# Patient Record
Sex: Female | Born: 1935
Health system: Southern US, Community
[De-identification: ages and names within clinical notes are randomized; demographics above are authoritative.]

## PROBLEM LIST (undated history)

## (undated) DIAGNOSIS — E669 Obesity, unspecified: Secondary | ICD-10-CM

## (undated) DIAGNOSIS — D510 Vitamin B12 deficiency anemia due to intrinsic factor deficiency: Secondary | ICD-10-CM

## (undated) DIAGNOSIS — H409 Unspecified glaucoma: Secondary | ICD-10-CM

## (undated) DIAGNOSIS — I10 Essential (primary) hypertension: Secondary | ICD-10-CM

## (undated) DIAGNOSIS — L723 Sebaceous cyst: Secondary | ICD-10-CM

## (undated) DIAGNOSIS — R269 Unspecified abnormalities of gait and mobility: Secondary | ICD-10-CM

## (undated) DIAGNOSIS — G35 Multiple sclerosis: Secondary | ICD-10-CM

## (undated) DIAGNOSIS — E538 Deficiency of other specified B group vitamins: Secondary | ICD-10-CM

## (undated) DIAGNOSIS — C50919 Malignant neoplasm of unspecified site of unspecified female breast: Secondary | ICD-10-CM

## (undated) DIAGNOSIS — G35D Multiple sclerosis, unspecified: Secondary | ICD-10-CM

## (undated) DIAGNOSIS — L259 Unspecified contact dermatitis, unspecified cause: Secondary | ICD-10-CM

## (undated) DIAGNOSIS — E039 Hypothyroidism, unspecified: Secondary | ICD-10-CM

## (undated) DIAGNOSIS — E785 Hyperlipidemia, unspecified: Secondary | ICD-10-CM

## (undated) DIAGNOSIS — M1710 Unilateral primary osteoarthritis, unspecified knee: Secondary | ICD-10-CM

## (undated) DIAGNOSIS — R7302 Impaired glucose tolerance (oral): Secondary | ICD-10-CM

## (undated) DIAGNOSIS — H269 Unspecified cataract: Secondary | ICD-10-CM

## (undated) DIAGNOSIS — IMO0002 Reserved for concepts with insufficient information to code with codable children: Secondary | ICD-10-CM

## (undated) HISTORY — DX: Deficiency of other specified B group vitamins: E53.8

## (undated) HISTORY — PX: UPPER GASTROINTESTINAL ENDOSCOPY: SHX188

## (undated) HISTORY — DX: Unspecified abnormalities of gait and mobility: R26.9

## (undated) HISTORY — DX: Unilateral primary osteoarthritis, unspecified knee: M17.10

## (undated) HISTORY — DX: Essential (primary) hypertension: I10

## (undated) HISTORY — DX: Vitamin B12 deficiency anemia due to intrinsic factor deficiency: D51.0

## (undated) HISTORY — DX: Impaired glucose tolerance (oral): R73.02

## (undated) HISTORY — DX: Unspecified glaucoma: H40.9

## (undated) HISTORY — DX: Malignant neoplasm of unspecified site of unspecified female breast: C50.919

## (undated) HISTORY — DX: Hyperlipidemia, unspecified: E78.5

## (undated) HISTORY — DX: Obesity, unspecified: E66.9

## (undated) HISTORY — DX: Unspecified cataract: H26.9

## (undated) HISTORY — DX: Sebaceous cyst: L72.3

## (undated) HISTORY — PX: COLONOSCOPY: SHX174

## (undated) HISTORY — DX: Reserved for concepts with insufficient information to code with codable children: IMO0002

## (undated) HISTORY — DX: Unspecified contact dermatitis, unspecified cause: L25.9

## (undated) HISTORY — DX: Multiple sclerosis: G35

---
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
## 1998-07-16 ENCOUNTER — Other Ambulatory Visit: Admission: RE | Admit: 1998-07-16 | Discharge: 1998-07-16 | Payer: Self-pay | Admitting: Family Medicine

## 1999-10-10 ENCOUNTER — Emergency Department (HOSPITAL_COMMUNITY): Admission: EM | Admit: 1999-10-10 | Discharge: 1999-10-10 | Payer: Self-pay | Admitting: Emergency Medicine

## 2000-10-08 ENCOUNTER — Other Ambulatory Visit: Admission: RE | Admit: 2000-10-08 | Discharge: 2000-10-08 | Payer: Self-pay | Admitting: Family Medicine

## 2000-11-17 ENCOUNTER — Encounter: Payer: Self-pay | Admitting: *Deleted

## 2000-11-17 ENCOUNTER — Emergency Department (HOSPITAL_COMMUNITY): Admission: EM | Admit: 2000-11-17 | Discharge: 2000-11-17 | Payer: Self-pay | Admitting: Emergency Medicine

## 2001-10-04 ENCOUNTER — Other Ambulatory Visit: Admission: RE | Admit: 2001-10-04 | Discharge: 2001-10-04 | Payer: Self-pay | Admitting: Family Medicine

## 2003-10-31 ENCOUNTER — Emergency Department (HOSPITAL_COMMUNITY): Admission: EM | Admit: 2003-10-31 | Discharge: 2003-10-31 | Payer: Self-pay | Admitting: Emergency Medicine

## 2004-12-08 ENCOUNTER — Ambulatory Visit: Payer: Self-pay | Admitting: Family Medicine

## 2004-12-15 ENCOUNTER — Ambulatory Visit: Payer: Self-pay | Admitting: Family Medicine

## 2005-01-21 ENCOUNTER — Ambulatory Visit: Payer: Self-pay | Admitting: Family Medicine

## 2005-03-23 LAB — HM COLONOSCOPY: HM Colonoscopy: NORMAL

## 2005-04-24 ENCOUNTER — Ambulatory Visit: Payer: Self-pay | Admitting: Family Medicine

## 2005-04-28 ENCOUNTER — Ambulatory Visit: Payer: Self-pay | Admitting: Family Medicine

## 2005-05-13 ENCOUNTER — Ambulatory Visit: Payer: Self-pay | Admitting: Family Medicine

## 2005-06-10 ENCOUNTER — Ambulatory Visit: Payer: Self-pay | Admitting: Family Medicine

## 2005-11-24 ENCOUNTER — Ambulatory Visit: Payer: Self-pay | Admitting: Family Medicine

## 2005-11-30 ENCOUNTER — Ambulatory Visit: Payer: Self-pay | Admitting: Family Medicine

## 2005-12-22 ENCOUNTER — Ambulatory Visit: Payer: Self-pay | Admitting: Gastroenterology

## 2005-12-30 ENCOUNTER — Ambulatory Visit: Payer: Self-pay | Admitting: Family Medicine

## 2006-01-05 ENCOUNTER — Ambulatory Visit: Payer: Self-pay | Admitting: Gastroenterology

## 2006-01-05 ENCOUNTER — Encounter: Payer: Self-pay | Admitting: Gastroenterology

## 2006-03-08 ENCOUNTER — Ambulatory Visit: Payer: Self-pay | Admitting: Family Medicine

## 2006-10-28 DIAGNOSIS — IMO0002 Reserved for concepts with insufficient information to code with codable children: Secondary | ICD-10-CM | POA: Insufficient documentation

## 2006-10-28 DIAGNOSIS — I10 Essential (primary) hypertension: Secondary | ICD-10-CM | POA: Insufficient documentation

## 2006-10-28 DIAGNOSIS — G35 Multiple sclerosis: Secondary | ICD-10-CM

## 2006-10-28 DIAGNOSIS — L259 Unspecified contact dermatitis, unspecified cause: Secondary | ICD-10-CM | POA: Insufficient documentation

## 2006-10-28 HISTORY — DX: Reserved for concepts with insufficient information to code with codable children: IMO0002

## 2006-10-28 HISTORY — DX: Unspecified contact dermatitis, unspecified cause: L25.9

## 2006-10-28 HISTORY — DX: Multiple sclerosis: G35

## 2006-10-28 HISTORY — DX: Essential (primary) hypertension: I10

## 2006-11-25 ENCOUNTER — Ambulatory Visit: Payer: Self-pay | Admitting: Family Medicine

## 2006-11-25 LAB — CONVERTED CEMR LAB
ALT: 30 units/L (ref 0–35)
AST: 25 units/L (ref 0–37)
Albumin: 3.6 g/dL (ref 3.5–5.2)
Alkaline Phosphatase: 73 units/L (ref 39–117)
BUN: 13 mg/dL (ref 6–23)
Basophils Absolute: 0 10*3/uL (ref 0.0–0.1)
Basophils Relative: 0.5 % (ref 0.0–1.0)
Bilirubin Urine: NEGATIVE
Bilirubin, Direct: 0.1 mg/dL (ref 0.0–0.3)
Blood in Urine, dipstick: NEGATIVE
CO2: 28 meq/L (ref 19–32)
Calcium: 9.2 mg/dL (ref 8.4–10.5)
Chloride: 109 meq/L (ref 96–112)
Cholesterol: 254 mg/dL (ref 0–200)
Creatinine, Ser: 1 mg/dL (ref 0.4–1.2)
Direct LDL: 178.1 mg/dL
Eosinophils Absolute: 0.1 10*3/uL (ref 0.0–0.6)
Eosinophils Relative: 1.6 % (ref 0.0–5.0)
GFR calc Af Amer: 70 mL/min
GFR calc non Af Amer: 58 mL/min
Glucose, Bld: 107 mg/dL — ABNORMAL HIGH (ref 70–99)
Glucose, Urine, Semiquant: NEGATIVE
HCT: 33.6 % — ABNORMAL LOW (ref 36.0–46.0)
HDL: 39.5 mg/dL (ref >39.0)
Hemoglobin: 11.3 g/dL — ABNORMAL LOW (ref 12.0–15.0)
Ketones, urine, test strip: NEGATIVE
Lymphocytes Relative: 36.6 % (ref 12.0–46.0)
MCHC: 33.7 g/dL (ref 30.0–36.0)
MCV: 86 fL (ref 78.0–100.0)
Monocytes Absolute: 0.5 10*3/uL (ref 0.2–0.7)
Monocytes Relative: 7.7 % (ref 3.0–11.0)
Neutro Abs: 3.2 10*3/uL (ref 1.4–7.7)
Neutrophils Relative %: 53.6 % (ref 43.0–77.0)
Nitrite: NEGATIVE
Platelets: 328 10*3/uL (ref 150–400)
Potassium: 4.2 meq/L (ref 3.5–5.1)
Protein, U semiquant: NEGATIVE
RBC: 3.91 M/uL (ref 3.87–5.11)
RDW: 13.9 % (ref 11.5–14.6)
Sodium: 143 meq/L (ref 135–145)
Specific Gravity, Urine: 1.015
TSH: 4.52 microintl units/mL (ref 0.35–5.50)
Total Bilirubin: 0.8 mg/dL (ref 0.3–1.2)
Total CHOL/HDL Ratio: 6.4
Total Protein: 7.3 g/dL (ref 6.0–8.3)
Triglycerides: 155 mg/dL — ABNORMAL HIGH (ref 0–149)
Urobilinogen, UA: 0.2
VLDL: 31 mg/dL (ref 0–40)
WBC: 6 10*3/uL (ref 4.5–10.5)
pH: 6

## 2006-12-03 ENCOUNTER — Ambulatory Visit: Payer: Self-pay | Admitting: Family Medicine

## 2007-01-04 ENCOUNTER — Ambulatory Visit: Payer: Self-pay | Admitting: Family Medicine

## 2007-03-03 ENCOUNTER — Encounter: Admission: RE | Admit: 2007-03-03 | Discharge: 2007-03-31 | Payer: Self-pay | Admitting: Neurology

## 2007-06-22 ENCOUNTER — Encounter: Payer: Self-pay | Admitting: Family Medicine

## 2007-07-06 ENCOUNTER — Encounter: Payer: Self-pay | Admitting: Family Medicine

## 2007-12-09 ENCOUNTER — Ambulatory Visit: Payer: Self-pay | Admitting: Family Medicine

## 2007-12-09 LAB — CONVERTED CEMR LAB
ALT: 21 units/L (ref 0–35)
AST: 23 units/L (ref 0–37)
Albumin: 3.6 g/dL (ref 3.5–5.2)
Alkaline Phosphatase: 78 units/L (ref 39–117)
BUN: 10 mg/dL (ref 6–23)
Basophils Absolute: 0 10*3/uL (ref 0.0–0.1)
Basophils Relative: 0.6 % (ref 0.0–3.0)
Bilirubin, Direct: 0.1 mg/dL (ref 0.0–0.3)
CO2: 27 meq/L (ref 19–32)
Calcium: 8.8 mg/dL (ref 8.4–10.5)
Chloride: 110 meq/L (ref 96–112)
Creatinine, Ser: 0.9 mg/dL (ref 0.4–1.2)
Eosinophils Absolute: 0 10*3/uL (ref 0.0–0.7)
Eosinophils Relative: 0.8 % (ref 0.0–5.0)
GFR calc Af Amer: 79 mL/min
GFR calc non Af Amer: 65 mL/min
Glucose, Bld: 110 mg/dL — ABNORMAL HIGH (ref 70–99)
HCT: 33.6 % — ABNORMAL LOW (ref 36.0–46.0)
Hemoglobin: 11.6 g/dL — ABNORMAL LOW (ref 12.0–15.0)
Lymphocytes Relative: 30.5 % (ref 12.0–46.0)
MCHC: 34.4 g/dL (ref 30.0–36.0)
MCV: 85 fL (ref 78.0–100.0)
Monocytes Absolute: 0.3 10*3/uL (ref 0.1–1.0)
Monocytes Relative: 6 % (ref 3.0–12.0)
Neutro Abs: 3.5 10*3/uL (ref 1.4–7.7)
Neutrophils Relative %: 62.1 % (ref 43.0–77.0)
Platelets: 319 10*3/uL (ref 150–400)
Potassium: 3.6 meq/L (ref 3.5–5.1)
RBC: 3.95 M/uL (ref 3.87–5.11)
RDW: 13.9 % (ref 11.5–14.6)
Sodium: 142 meq/L (ref 135–145)
TSH: 2.34 microintl units/mL (ref 0.35–5.50)
Total Bilirubin: 1.1 mg/dL (ref 0.3–1.2)
Total Protein: 7.4 g/dL (ref 6.0–8.3)
WBC: 5.5 10*3/uL (ref 4.5–10.5)

## 2008-01-05 ENCOUNTER — Telehealth: Payer: Self-pay | Admitting: Family Medicine

## 2008-01-05 ENCOUNTER — Ambulatory Visit: Payer: Self-pay | Admitting: Family Medicine

## 2008-01-31 ENCOUNTER — Telehealth: Payer: Self-pay | Admitting: Family Medicine

## 2008-03-26 ENCOUNTER — Telehealth: Payer: Self-pay | Admitting: Family Medicine

## 2008-06-18 ENCOUNTER — Encounter: Payer: Self-pay | Admitting: Internal Medicine

## 2008-06-22 ENCOUNTER — Encounter: Payer: Self-pay | Admitting: Family Medicine

## 2008-12-13 ENCOUNTER — Ambulatory Visit: Payer: Self-pay | Admitting: Family Medicine

## 2008-12-17 ENCOUNTER — Encounter: Payer: Self-pay | Admitting: Family Medicine

## 2008-12-19 ENCOUNTER — Encounter: Payer: Self-pay | Admitting: Internal Medicine

## 2009-01-07 ENCOUNTER — Telehealth: Payer: Self-pay | Admitting: Family Medicine

## 2009-01-22 ENCOUNTER — Ambulatory Visit: Payer: Self-pay | Admitting: Family Medicine

## 2009-01-22 LAB — CONVERTED CEMR LAB
Eosinophils Relative: 1.1 % (ref 0.0–5.0)
Lymphocytes Relative: 35.8 % (ref 12.0–46.0)
Monocytes Relative: 6.5 % (ref 3.0–12.0)
Neutrophils Relative %: 55.9 % (ref 43.0–77.0)
Platelets: 284 10*3/uL (ref 150.0–400.0)
RBC: 3.57 M/uL — ABNORMAL LOW (ref 3.87–5.11)
WBC: 5 10*3/uL (ref 4.5–10.5)

## 2009-01-23 LAB — CONVERTED CEMR LAB
ALT: 16 units/L (ref 0–35)
AST: 17 units/L (ref 0–37)
Albumin: 3.5 g/dL (ref 3.5–5.2)
Alkaline Phosphatase: 92 units/L (ref 39–117)
BUN: 13 mg/dL (ref 6–23)
Basophils Absolute: 0.1 10*3/uL (ref 0.0–0.1)
Basophils Relative: 1.1 % (ref 0.0–3.0)
Bilirubin, Direct: 0.1 mg/dL (ref 0.0–0.3)
CO2: 29 meq/L (ref 19–32)
Calcium: 8.8 mg/dL (ref 8.4–10.5)
Chloride: 108 meq/L (ref 96–112)
Creatinine, Ser: 0.9 mg/dL (ref 0.4–1.2)
Eosinophils Absolute: 0.1 10*3/uL (ref 0.0–0.7)
Eosinophils Relative: 1 % (ref 0.0–5.0)
GFR calc non Af Amer: 78.49 mL/min (ref >60)
Glucose, Bld: 104 mg/dL — ABNORMAL HIGH (ref 70–99)
HCT: 32.7 % — ABNORMAL LOW (ref 36.0–46.0)
Hemoglobin: 10.9 g/dL — ABNORMAL LOW (ref 12.0–15.0)
Lymphocytes Relative: 35.8 % (ref 12.0–46.0)
Lymphs Abs: 2 10*3/uL (ref 0.7–4.0)
MCHC: 33.4 g/dL (ref 30.0–36.0)
MCV: 84.9 fL (ref 78.0–100.0)
Monocytes Absolute: 0.4 10*3/uL (ref 0.1–1.0)
Monocytes Relative: 7.9 % (ref 3.0–12.0)
Neutro Abs: 2.9 10*3/uL (ref 1.4–7.7)
Neutrophils Relative %: 54.2 % (ref 43.0–77.0)
Platelets: 328 10*3/uL (ref 150.0–400.0)
Potassium: 4 meq/L (ref 3.5–5.1)
RBC: 3.85 M/uL — ABNORMAL LOW (ref 3.87–5.11)
RDW: 14.4 % (ref 11.5–14.6)
Sodium: 141 meq/L (ref 135–145)
TSH: 2.28 microintl units/mL (ref 0.35–5.50)
Total Bilirubin: 0.9 mg/dL (ref 0.3–1.2)
Total Protein: 7.1 g/dL (ref 6.0–8.3)
WBC: 5.5 10*3/uL (ref 4.5–10.5)

## 2009-01-24 ENCOUNTER — Ambulatory Visit: Payer: Self-pay | Admitting: Family Medicine

## 2009-01-25 ENCOUNTER — Encounter: Payer: Self-pay | Admitting: *Deleted

## 2009-01-25 LAB — CONVERTED CEMR LAB
Folate: 10.5 ng/mL
Iron: 50 ug/dL (ref 42–145)
Saturation Ratios: 16.4 % — ABNORMAL LOW (ref 20.0–50.0)
Transferrin: 217.8 mg/dL (ref 212.0–360.0)
Vitamin B-12: 107 pg/mL — ABNORMAL LOW (ref 211–911)

## 2009-01-30 ENCOUNTER — Ambulatory Visit: Payer: Self-pay | Admitting: Family Medicine

## 2009-01-30 DIAGNOSIS — D510 Vitamin B12 deficiency anemia due to intrinsic factor deficiency: Secondary | ICD-10-CM

## 2009-01-30 HISTORY — DX: Vitamin B12 deficiency anemia due to intrinsic factor deficiency: D51.0

## 2009-02-26 ENCOUNTER — Telehealth: Payer: Self-pay | Admitting: Family Medicine

## 2009-03-21 ENCOUNTER — Encounter: Payer: Self-pay | Admitting: Internal Medicine

## 2009-04-16 ENCOUNTER — Telehealth: Payer: Self-pay | Admitting: Family Medicine

## 2009-05-20 ENCOUNTER — Encounter: Admission: RE | Admit: 2009-05-20 | Discharge: 2009-08-18 | Payer: Self-pay | Admitting: Family Medicine

## 2009-06-06 ENCOUNTER — Encounter: Payer: Self-pay | Admitting: Family Medicine

## 2009-06-11 ENCOUNTER — Telehealth: Payer: Self-pay | Admitting: Family Medicine

## 2009-06-14 ENCOUNTER — Ambulatory Visit: Payer: Self-pay | Admitting: Internal Medicine

## 2009-06-14 DIAGNOSIS — E538 Deficiency of other specified B group vitamins: Secondary | ICD-10-CM

## 2009-06-14 DIAGNOSIS — IMO0002 Reserved for concepts with insufficient information to code with codable children: Secondary | ICD-10-CM

## 2009-06-14 DIAGNOSIS — D509 Iron deficiency anemia, unspecified: Secondary | ICD-10-CM | POA: Insufficient documentation

## 2009-06-14 DIAGNOSIS — S62109A Fracture of unspecified carpal bone, unspecified wrist, initial encounter for closed fracture: Secondary | ICD-10-CM | POA: Insufficient documentation

## 2009-06-14 DIAGNOSIS — R269 Unspecified abnormalities of gait and mobility: Secondary | ICD-10-CM | POA: Insufficient documentation

## 2009-06-14 DIAGNOSIS — M1710 Unilateral primary osteoarthritis, unspecified knee: Secondary | ICD-10-CM

## 2009-06-14 HISTORY — DX: Reserved for concepts with insufficient information to code with codable children: IMO0002

## 2009-06-14 HISTORY — DX: Deficiency of other specified B group vitamins: E53.8

## 2009-06-17 ENCOUNTER — Telehealth (INDEPENDENT_AMBULATORY_CARE_PROVIDER_SITE_OTHER): Payer: Self-pay | Admitting: *Deleted

## 2009-06-17 ENCOUNTER — Telehealth: Payer: Self-pay | Admitting: Internal Medicine

## 2009-06-24 ENCOUNTER — Encounter: Payer: Self-pay | Admitting: Internal Medicine

## 2010-01-29 ENCOUNTER — Ambulatory Visit: Payer: Self-pay | Admitting: Internal Medicine

## 2010-01-29 LAB — CONVERTED CEMR LAB
Alkaline Phosphatase: 75 units/L (ref 39–117)
Basophils Absolute: 0 10*3/uL (ref 0.0–0.1)
Bilirubin Urine: NEGATIVE
Bilirubin, Direct: 0.1 mg/dL (ref 0.0–0.3)
CO2: 28 meq/L (ref 19–32)
Calcium: 9.2 mg/dL (ref 8.4–10.5)
HCT: 35.6 % — ABNORMAL LOW (ref 36.0–46.0)
HDL: 50.8 mg/dL (ref >39.00)
Lymphocytes Relative: 38.1 % (ref 12.0–46.0)
Lymphs Abs: 2.3 10*3/uL (ref 0.7–4.0)
Monocytes Relative: 6.2 % (ref 3.0–12.0)
Neutrophils Relative %: 54 % (ref 43.0–77.0)
Nitrite: NEGATIVE
Platelets: 318 10*3/uL (ref 150.0–400.0)
RDW: 14.7 % — ABNORMAL HIGH (ref 11.5–14.6)
Sodium: 143 meq/L (ref 135–145)
Total CHOL/HDL Ratio: 5
Total Protein: 6.9 g/dL (ref 6.0–8.3)
Triglycerides: 121 mg/dL (ref 0.0–149.0)
Urobilinogen, UA: 0.2 (ref 0.0–1.0)
pH: 6.5 (ref 5.0–8.0)

## 2010-02-04 ENCOUNTER — Ambulatory Visit: Payer: Self-pay | Admitting: Internal Medicine

## 2010-02-16 DIAGNOSIS — E785 Hyperlipidemia, unspecified: Secondary | ICD-10-CM | POA: Insufficient documentation

## 2010-02-16 HISTORY — DX: Hyperlipidemia, unspecified: E78.5

## 2010-03-05 ENCOUNTER — Telehealth: Payer: Self-pay | Admitting: Internal Medicine

## 2010-03-06 ENCOUNTER — Ambulatory Visit: Payer: Self-pay | Admitting: Internal Medicine

## 2010-03-06 DIAGNOSIS — R42 Dizziness and giddiness: Secondary | ICD-10-CM | POA: Insufficient documentation

## 2010-03-07 ENCOUNTER — Telehealth: Payer: Self-pay | Admitting: Internal Medicine

## 2010-03-11 ENCOUNTER — Telehealth: Payer: Self-pay | Admitting: Internal Medicine

## 2010-04-01 ENCOUNTER — Other Ambulatory Visit: Payer: Self-pay | Admitting: Internal Medicine

## 2010-04-01 ENCOUNTER — Ambulatory Visit
Admission: RE | Admit: 2010-04-01 | Discharge: 2010-04-01 | Payer: Self-pay | Source: Home / Self Care | Attending: Internal Medicine | Admitting: Internal Medicine

## 2010-04-01 LAB — LIPID PANEL
Cholesterol: 126 mg/dL (ref 0–200)
HDL: 39.8 mg/dL (ref >39.00)
LDL Cholesterol: 71 mg/dL (ref 0–99)
Total CHOL/HDL Ratio: 3
Triglycerides: 78 mg/dL (ref 0.0–149.0)
VLDL: 15.6 mg/dL (ref 0.0–40.0)

## 2010-04-01 LAB — HEPATIC FUNCTION PANEL
ALT: 14 U/L (ref 0–35)
AST: 15 U/L (ref 0–37)
Albumin: 3.5 g/dL (ref 3.5–5.2)
Alkaline Phosphatase: 79 U/L (ref 39–117)
Bilirubin, Direct: 0.2 mg/dL (ref 0.0–0.3)
Total Bilirubin: 1 mg/dL (ref 0.3–1.2)
Total Protein: 6.9 g/dL (ref 6.0–8.3)

## 2010-04-22 NOTE — Letter (Signed)
Summary: [facility]  [facility]   Imported By: Sherian Rein 06/20/2009 11:01:51  _____________________________________________________________________  External Attachment:    Type:   Image     Comment:   External Document

## 2010-04-22 NOTE — Progress Notes (Signed)
  Phone Note Refill Request  on June 17, 2009 8:40 AM  Refills Requested: Medication #1:  CYANOCOBALAMIN 1000 MCG/ML SOLN inject 1 cc weekly for 2 months. then inject 1cc once monthly thereafter.   Dosage confirmed as above?Dosage Confirmed Initial call taken by: Scharlene Gloss,  June 17, 2009 8:40 AM    Prescriptions: CYANOCOBALAMIN 1000 MCG/ML SOLN (CYANOCOBALAMIN) inject 1 cc weekly for 2 months. then inject 1cc once monthly thereafter.  #1 x 3   Entered by:   Scharlene Gloss   Authorized by:   Corwin Levins MD   Signed by:   Scharlene Gloss on 06/17/2009   Method used:   [facility]ed to ...       [facility]  [address] [phone][facility] (retail[facility]       [address]       [place], [place]  [address]       Ph: [phone] or [phone]       Fax: [phone]   RxID:   [id number]

## 2010-04-22 NOTE — Assessment & Plan Note (Signed)
Summary: FU Deanna Wall  #   Vital Signs:  Patient profile:   77 year old female Height:      66 inches Weight:      230 pounds BMI:     37.26 O2 Sat:      95 % on Room air Temp:     97.9 degrees F oral Pulse rate:   63 / minute BP sitting:   140 / 80  (left arm) Cuff size:   large  Vitals Entered By: Zella Ball Ewing CMA (AAMA) (February 04, 2010 9:00 AM)  O2 Flow:  Room air  Preventive Care Screening  Last Flu Shot:    Date:  02/04/2010    Results:  Fluvax 3+  Mammogram:    Date:  06/24/2009    Results:  normal   CC: followup/RE   CC:  followup/RE.  History of Present Illness: here for wellness; overall doing ok;  Pt denies CP, worsening sob, doe, wheezing, orthopnea, pnd, worsening LE edema, palps, dizziness or syncope  Pt denies new neuro symptoms such as headache, facial or extremity weakness  Pt denies polydipsia, polyuria.  Overall good compliance with meds, trying to follow lower chol diet, wt stable, little excercise however  No increased falls lately; has new powerchair.  No fever, wt loss, night sweats, loss of appetite or other constitutional symptoms  Denies worsening depressive symptoms, suicidal ideation, or panic.   Pt states good ability with ADL's, low fall risk, home safety reviewed and adequate, no significant change in hearing or vision, trying to follow lower chol diet, and occasionally active only with regular excercise.   Problems Prior to Update: 1)  Abnormality of Gait  (ICD-781.2) 2)  Lumbar Radiculopathy, Right  (ICD-724.4) 3)  Osteoarthritis, Knees, Bilateral, Severe  (ICD-715.96) 4)  Fracture, Wrist, Left  (ICD-814.00) 5)  Vitamin B12 Deficiency  (ICD-266.2) 6)  Anemia-nos  (ICD-285.9) 7)  Anemia, Pernicious  (ICD-281.0) 8)  Preventive Health Care  (ICD-V70.0) 9)  Eczema  (ICD-692.9) 10)  Degenerative Disc Disease  (ICD-722.6) 11)  Multiple Sclerosis  (ICD-340) 12)  Hypertension  (ICD-401.9)  Medications Prior to Update: 1)  Zestril 40 Mg  Tabs  (Lisinopril) .... Take 2 Every Bedtime 2)  Lasix 20 Mg  Tabs (Furosemide) .... Take One Every Morning 3)  Clonidine Hcl 0.2 Mg  Tabs (Clonidine Hcl) .... Take 1 Tablet By Mouth Every Morning 4)  Lidex 0.05 %  Crea (Fluocinonide) .... Apply At Bedtime 5)  Diclofenac Sodium 75 Mg  Tbec (Diclofenac Sodium) .Marland Kitchen.. 1 Tab Q 12 As Needed 6)  Cyanocobalamin 1000 Mcg/ml Soln (Cyanocobalamin) .... Inject 1 Cc Weekly For 2 Months. Then Inject 1cc Once Monthly Thereafter. 7)  Bd Filter Needle/5 Micron  Misc (Needles & Syringes) .... Use To Inject B12 8)  Bd Eclipse Syringe 25g X 5/8" 3 Ml Misc (Syringe/needle (Disp)) .... Use As Directed With B12 9)  Ciprofloxacin Hcl 500 Mg Tabs (Ciprofloxacin Hcl) .Marland Kitchen.. 1 By Mouth Two Times A Day  Current Medications (verified): 1)  Zestril 40 Mg  Tabs (Lisinopril) .... Take 2 Every Bedtime 2)  Lasix 20 Mg  Tabs (Furosemide) .... Take One Every Morning 3)  Clonidine Hcl 0.2 Mg  Tabs (Clonidine Hcl) .... Take 1 Tablet By Mouth Every Morning 4)  Lidex 0.05 %  Crea (Fluocinonide) .... Apply At Bedtime 5)  Diclofenac Sodium 75 Mg  Tbec (Diclofenac Sodium) .Marland Kitchen.. 1 Tab Q 12 As Needed 6)  Cyanocobalamin 1000 Mcg/ml Soln (Cyanocobalamin) .... Inject 1 Cc Weekly  For 2 Months. Then Inject 1cc Once Monthly Thereafter. 7)  Bd Filter Needle/5 Micron  Misc (Needles & Syringes) .... Use To Inject B12 8)  Bd Eclipse Syringe 25g X 5/8" 3 Ml Misc (Syringe/needle (Disp)) .... Use As Directed With B12 9)  Ciprofloxacin Hcl 500 Mg Tabs (Ciprofloxacin Hcl) .Marland Kitchen.. 1 By Mouth Two Times A Day 10)  Lipitor 10 Mg Tabs (Atorvastatin Calcium) .Marland Kitchen.. 1po Once Daily 11)  Aspir-Low 81 Mg Tbec (Aspirin) .Marland Kitchen.. 1po Once Daily  Allergies (verified): No Known Drug Allergies  Past History:  Past Surgical History: Last updated: 06/14/2009 s/p right wrist fracture 2005  Family History: Last updated: 06/14/2009 mother with bone cancer arthritis  Social History: Last updated: 06/14/2009 Retired -  homemaker Married Never Smoked Alcohol use-no Drug use-no Regular exercise-no  Risk Factors: Exercise: no (12/09/2007)  Risk Factors: Smoking Status: never (01/30/2009)  Past Medical History: Hypertension ? MS  340.0 - no followed per Dr Bradd Burner, initial dx per pt 1974 degenerative disc disease - lumbar eczema recurrent falls/gait disorder Anemia-NOS vitamin B12 deficiency DJD - severe bilat knee/chronic pain chronic right lumbar L5-S1 radiculopathy by EMG ? medical compliance issue Hyperlipidemia  Review of Systems  The patient denies anorexia, fever, vision loss, decreased hearing, hoarseness, chest pain, syncope, dyspnea on exertion, peripheral edema, prolonged cough, headaches, hemoptysis, abdominal pain, melena, hematochezia, severe indigestion/heartburn, hematuria, muscle weakness, suspicious skin lesions, transient blindness, depression, unusual weight change, abnormal bleeding, enlarged lymph nodes, and angioedema.         all otherwise negative per pt -  midl UTI symtpoms improved with recent tx  Physical Exam  General:  alert and overweight-appearing.   Head:  normocephalic and atraumatic.   Eyes:  vision grossly intact, pupils equal, and pupils round.   Ears:  R ear normal and L ear normal.   Nose:  no external deformity and no nasal discharge.   Mouth:  no gingival abnormalities and pharynx pink and moist.   Neck:  supple and no masses.   Lungs:  normal respiratory effort and normal breath sounds.   Heart:  normal rate and regular rhythm.   Abdomen:  soft, non-tender, and normal bowel sounds.   Msk:  no acute joint tenderness and no joint swelling.  , no flank tender Extremities:  no edema, no erythema  Neurologic:  cranial nerves II-XII intact and strength normal in all extremities.   Skin:  color normal and no rashes.   Psych:  not anxious appearing and not depressed appearing.     Impression & Recommendations:  Problem # 1:  Preventive Health Care  (ICD-V70.0) Overall doing well, age appropriate education and counseling updated, referral for preventive services and immunizations addressed, dietary counseling and smoking status adressed , most recent labs reviewed I have personally reviewed and have noted 1.The patient's medical and social history 2.Their use of alcohol, tobacco or illicit drugs 3.Their current medications and supplements 4. Functional ability including ADL's, fall risk, home safety risk, hearing & visual impairment  5.Diet and physical activities 6.Evidence for depression or mood disorders The patients weight, height, BMI  have been recorded in the chart I have made referrals, counseling and provided education to the patient based review of the above   Problem # 2:  OSTEOARTHRITIS, KNEES, BILATERAL, SEVERE (ICD-715.96)  Her updated medication list for this problem includes:    Diclofenac Sodium 75 Mg Tbec (Diclofenac sodium) .Marland Kitchen... 1 tab q 12 as needed    Aspir-low 81 Mg Tbec (Aspirin) .Marland Kitchen... 1po  once daily re-start med  as above  Problem # 3:  HYPERTENSION (ICD-401.9)  Her updated medication list for this problem includes:    Zestril 40 Mg Tabs (Lisinopril) .Marland Kitchen... Take 2 every bedtime    Lasix 20 Mg Tabs (Furosemide) .Marland Kitchen... Take one every morning    Clonidine Hcl 0.2 Mg Tabs (Clonidine hcl) .Marland Kitchen... Take 1 tablet by mouth every morning  BP today: 140/80 Prior BP: 142/86 (06/14/2009)  Labs Reviewed: K+: 4.5 (01/29/2010) Creat: : 0.9 (01/29/2010)   Chol: 252 (01/29/2010)   HDL: 50.80 (01/29/2010)   LDL: DEL (11/25/2006)   TG: 121.0 (01/29/2010) stable overall by hx and exam, ok to continue meds/tx as is   Problem # 4:  HYPERLIPIDEMIA (ICD-272.4)  Her updated medication list for this problem includes:    Lipitor 10 Mg Tabs (Atorvastatin calcium) .Marland Kitchen... 1po once daily  Labs Reviewed: SGOT: 16 (01/29/2010)   SGPT: 13 (01/29/2010)   HDL:50.80 (01/29/2010), 39.5 (11/25/2006)  LDL:DEL (11/25/2006)  Chol:252  (01/29/2010), 254 (11/25/2006)  Trig:121.0 (01/29/2010), 155 (11/25/2006) treat as above, f/u any worsening signs or symptoms  - with f/u labs  Complete Medication List: 1)  Zestril 40 Mg Tabs (Lisinopril) .... Take 2 every bedtime 2)  Lasix 20 Mg Tabs (Furosemide) .... Take one every morning 3)  Clonidine Hcl 0.2 Mg Tabs (Clonidine hcl) .... Take 1 tablet by mouth every morning 4)  Lidex 0.05 % Crea (Fluocinonide) .... Apply at bedtime 5)  Diclofenac Sodium 75 Mg Tbec (Diclofenac sodium) .Marland Kitchen.. 1 tab q 12 as needed 6)  Cyanocobalamin 1000 Mcg/ml Soln (Cyanocobalamin) .... Inject 1 cc weekly for 2 months. then inject 1cc once monthly thereafter. 7)  Bd Filter Needle/5 Micron Misc (Needles & syringes) .... Use to inject b12 8)  Bd Eclipse Syringe 25g X 5/8" 3 Ml Misc (Syringe/needle (disp)) .... Use as directed with b12 9)  Ciprofloxacin Hcl 500 Mg Tabs (Ciprofloxacin hcl) .Marland Kitchen.. 1 by mouth two times a day 10)  Lipitor 10 Mg Tabs (Atorvastatin calcium) .Marland Kitchen.. 1po once daily 11)  Aspir-low 81 Mg Tbec (Aspirin) .Marland Kitchen.. 1po once daily  Other Orders: Flu Vaccine 38yrs + MEDICARE PATIENTS (O5366) Administration Flu vaccine - MCR (G0008) Tdap => 56yrs IM (44034) Admin 1st Vaccine (74259)  Patient Instructions: 1)  you had the flu shot today 2)  please follow lower cholesterol diet 3)  when the lipitor is generic later this month, start the lipitor at 10 mg per day 4)  please return for LAB only in 2 mo:  5)  Hepatic Panel prior to visit, ICD-9: v58.69 6)  Lipid Panel prior to visit, ICD-9: 272.0 7)  you had the tetanus shot today 8)  Please schedule a follow-up appointment in 6 months. 9)  Take an Aspirin every day - 81 mg - 1 per day - COATED only Prescriptions: LIPITOR 10 MG TABS (ATORVASTATIN CALCIUM) 1po once daily  #90 x 3   Entered and Authorized by:   Corwin Levins MD   Signed by:   Corwin Levins MD on 02/04/2010   Method used:   Print then Give to Patient   RxID:    [id number] DICLOFENAC SODIUM 75 MG  TBEC (DICLOFENAC SODIUM) 1 tab q 12 as needed  #180 x 3   Entered and Authorized by:   Corwin Levins MD   Signed by:   Corwin Levins MD on 02/04/2010   Method used:   [facility]        [facility]  [address] [phone][facility] (retail)       [address]       [place], [place]  [id number]       Ph: [phone] or [phone]       Fax: [phone]   RxID:   [id number]    Orders Added: 1)  Flu Vaccine 76yrs + MEDICARE PATIENTS [Q2039] 2)  Administration Flu vaccine - MCR [G0008] 3)  Tdap => 14yrs IM [90715] 4)  Admin 1st Vaccine [90471] 5)  Est. Patient 65& > [40347]   Immunizations Administered:  Tetanus Vaccine:    Vaccine Type: Tdap    Site: right deltoid    Mfr: GlaxoSmithKline    Dose: 0.5 ml    Route: IM    Given by: Zella Ball Ewing CMA (AAMA)    Exp. Date: 01/10/2012    Lot #: [id number]    VIS given: 02/08/08 version given February 04, 2010.   Immunizations Administered:  Tetanus Vaccine:    Vaccine Type: Tdap    Site: right deltoid    Mfr: GlaxoSmithKline    Dose: 0.5 ml    Route: IM    Given by: Zella Ball Ewing CMA (AAMA)    Exp. Date: 01/10/2012    Lot #: [id number]    VIS given: 02/08/08 version given February 04, 2010. Flu Vaccine Consent Questions     Do you have a history of severe allergic reactions to this vaccine? no    Any prior history of allergic reactions to egg and/or gelatin? no    Do you have a sensitivity to the preservative Thimersol? no    Do you have a past history of Guillan-Barre Syndrome? no    Do you currently have an acute febrile illness? no    Have you ever had a severe reaction to latex? no    Vaccine information given and explained to patient? yes    Are you currently pregnant? no    Lot Number:[id number]   Exp Date:09/20/2010   Site Given  Left Deltoid [id number]

## 2010-04-22 NOTE — Assessment & Plan Note (Signed)
Summary: new pt/bcbs medicare/#/lb   Vital Signs:  Patient profile:   77 year old female Height:      66 inches Weight:      225 pounds BMI:     36.45 O2 Sat:      96 % on Room air Temp:     97.6 degrees F oral Pulse rate:   60 / minute BP sitting:   142 / 86  (left arm) Cuff size:   large  Vitals Entered ByZella Ball Ewing (June 14, 2009 1:09 PM)  O2 Flow:  Room air  Preventive Care Screening  Colonoscopy:    Date:  03/23/2005    Next Due:  03/2015    Results:  normal   Mammogram:    Date:  06/20/2008    Results:  normal      has appt for next mamogram apr 4  CC: New pt, get established/RE   CC:  New pt and get established/RE.  History of Present Illness: Pt here in transfer, poor historian,  here power wheelchair applicatoin was not agreed to per Dr Tawanna Cooler as suggested per PT (per PT) (last seen per PT feb 28 - no records on the chart for this); and [facility] office close to home, so pt now transferring her care here.  Had PT at home but did not really help, wheelchair bound for most of 2010, prior to that used walker; still capable with ADL's and walker use , no more PT planned.  Has recurrent falls, and hx of fall with wrist fracture 2005.  Curretnly Sees Dr Terrace Arabia for ? MS (used to see Dr Anne Shutter);  dx with MS since 1974 per pt but I have reviewed the last 3 notes per [facility] and dx of MS is not actually definite and pt has deferred further evaluation as of dec 2010 last visit;  pt can stand up by pulling on walker and can get to BR but very slowly, has accidents, and cant step over a low step at the front door but o/w capable to ADL's.   Also with severe knee DJD with significant pain with this well and chronic L5-S1 radiculpathy and gait disorder;  has right knee swelling today and bilat pain, last saw orthopedic approx 2 yrs ago who pt stated declined to do her knee replacements due to the MS;  but review of [facility] notes indicate the pt herself apparently believes she  is not surgical candidate as she will "never wake up".   BP at home < 140/90.  Pt denies CP, sob, doe, wheezing, orthopnea, pnd, worsening LE edema, palps, dizziness or syncope  Pt denies new neuro symptoms such as headache, facial or extremity weakness .  Pt next neuro f/u due Dec 2011;  no prior tx of MS in the past such as immunomodulation, some ? of medical compliance and low understanding of  MS may be factors.  Pt is right handed  Problems Prior to Update: 1)  Abnormality of Gait  (ICD-781.2) 2)  Lumbar Radiculopathy, Right  (ICD-724.4) 3)  Osteoarthritis, Knees, Bilateral, Severe  (ICD-715.96) 4)  Fracture, Wrist, Left  (ICD-814.00) 5)  Vitamin B12 Deficiency  (ICD-266.2) 6)  Anemia-nos  (ICD-285.9) 7)  Anemia, Pernicious  (ICD-281.0) 8)  Preventive Health Care  (ICD-V70.0) 9)  Eczema  (ICD-692.9) 10)  Degenerative Disc Disease  (ICD-722.6) 11)  Multiple Sclerosis  (ICD-340) 12)  Hypertension  (ICD-401.9)  Medications Prior to Update: 1)  Zestril 40 Mg  Tabs (Lisinopril) .... Take  2 Every Bedtime 2)  Lasix 20 Mg  Tabs (Furosemide) .... Take One Every Morning 3)  Clonidine Hcl 0.2 Mg  Tabs (Clonidine Hcl) .... Take 1 Tablet By Mouth Every Morning 4)  Lidex 0.05 %  Crea (Fluocinonide) .... Apply At Bedtime 5)  Diclofenac Sodium 75 Mg  Tbec (Diclofenac Sodium) .Marland Kitchen.. 1 Tab Q 12 As Needed 6)  Cyanocobalamin 1000 Mcg/ml Soln (Cyanocobalamin) .... Inject 1 Cc Weekly For 2 Months. Then Inject 1cc Once Monthly Thereafter. 7)  Bd Filter Needle/5 Micron  Misc (Needles & Syringes) .... Use To Inject B12 8)  Ferrous Sulfate 325 (65 Fe) Mg Tabs (Ferrous Sulfate) .... Once Daily 9)  Bd Eclipse Syringe 25g X 5/8" 3 Ml Misc (Syringe/needle (Disp)) .... Use As Directed With B12  Current Medications (verified): 1)  Zestril 40 Mg  Tabs (Lisinopril) .... Take 2 Every Bedtime 2)  Lasix 20 Mg  Tabs (Furosemide) .... Take One Every Morning 3)  Clonidine Hcl 0.2 Mg  Tabs (Clonidine Hcl) .... Take 1 Tablet  By Mouth Every Morning 4)  Lidex 0.05 %  Crea (Fluocinonide) .... Apply At Bedtime 5)  Diclofenac Sodium 75 Mg  Tbec (Diclofenac Sodium) .Marland Kitchen.. 1 Tab Q 12 As Needed 6)  Cyanocobalamin 1000 Mcg/ml Soln (Cyanocobalamin) .... Inject 1 Cc Weekly For 2 Months. Then Inject 1cc Once Monthly Thereafter. 7)  Bd Filter Needle/5 Micron  Misc (Needles & Syringes) .... Use To Inject B12 8)  Bd Eclipse Syringe 25g X 5/8" 3 Ml Misc (Syringe/needle (Disp)) .... Use As Directed With B12  Allergies (verified): No Known Drug Allergies  Past History:  Family History: Last updated: 06/14/2009 mother with bone cancer arthritis  Social History: Last updated: 06/14/2009 Retired - homemaker Married Never Smoked Alcohol use-no Drug use-no Regular exercise-no  Risk Factors: Exercise: no (12/09/2007)  Risk Factors: Smoking Status: never (01/30/2009)  Past Medical History: Hypertension ? MS  340.0 - no followed per Dr Bradd Burner, initial dx per pt 1974 degenerative disc disease - lumbar eczema recurrent falls/gait disorder Anemia-NOS vitamin B12 deficiency DJD - severe bilat knee/chronic pain chronic right lumbar L5-S1 radiculopathy by EMG ? medical compliance issue  Past Surgical History: s/p right wrist fracture 2005  Family History: Reviewed history and no changes required. mother with bone cancer arthritis  Social History: Reviewed history from 12/09/2007 and no changes required. Retired - homemaker Married Never Smoked Alcohol use-no Drug use-no Regular exercise-no  Review of Systems  The patient denies anorexia, fever, vision loss, decreased hearing, hoarseness, chest pain, syncope, dyspnea on exertion, peripheral edema, prolonged cough, hemoptysis, abdominal pain, melena, hematochezia, severe indigestion/heartburn, hematuria, incontinence, muscle weakness, suspicious skin lesions, depression, unusual weight change, abnormal bleeding, enlarged lymph nodes, and angioedema.          all otherwise negative per pt -    Physical Exam  General:  alert and overweight-appearing.   Head:  normocephalic and atraumatic.   Eyes:  vision grossly intact, pupils equal, and pupils round.   Ears:  R ear normal and L ear normal.   Nose:  nasal dischargemucosal pallor and mucosal edema.   Mouth:  no gingival abnormalities and pharynx pink and moist.   Neck:  supple and no masses.   Lungs:  normal respiratory effort and normal breath sounds.   Heart:  normal rate and regular rhythm.   Abdomen:  soft, non-tender, and normal bowel sounds.   Msk:  right knee effusion 1+ and warm;  left knee with severe bony abnormality and varus  deformity but no tender or effusion Extremities:  no edema, no erythema  Neurologic:  alert & oriented X3, cranial nerves II-XII intact, and strength normal in all extremities except for mild 4/5 distal LE weakness; did not perform detailed sensory, gait or dtr's today, except brief exam showed some decreased senstion left T1 dermatome Skin:  color normal and no rashes.   Psych:  normally interactive and moderately anxious.     Impression & Recommendations:  Problem # 1:  MULTIPLE SCLEROSIS (ICD-340) not definite diagnosis per neurology recent notes; apparently overall stable until the past year but not clear if gait disorder worsening in the past year (now wheelchair bound) is due to chronic right radiculpathy, chronic pain, knee DJD or other factor such as psychological;  pt has deferred further specific MS definitive evaluation and reaffirms this today; will follow for now  Problem # 2:  FRACTURE, WRIST, LEFT (ICD-814.00)  at higher risk for osteoporosis - to check dxa  Orders: T-Bone Densitometry (16109)  Problem # 3:  HYPERTENSION (ICD-401.9)  Her updated medication list for this problem includes:    Zestril 40 Mg Tabs (Lisinopril) .Marland Kitchen... Take 2 every bedtime    Lasix 20 Mg Tabs (Furosemide) .Marland Kitchen... Take one every morning    Clonidine Hcl 0.2 Mg  Tabs (Clonidine hcl) .Marland Kitchen... Take 1 tablet by mouth every morning  BP today: 142/86 Prior BP: 164/104 (01/30/2009)  Labs Reviewed: K+: 4.0 (12/13/2008) Creat: : 0.9 (12/13/2008)   Chol: 254 (11/25/2006)   HDL: 39.5 (11/25/2006)   LDL: DEL (11/25/2006)   TG: 155 (11/25/2006) stable overall by hx and exam, ok to continue meds/tx as is   Problem # 4:  OSTEOARTHRITIS, KNEES, BILATERAL, SEVERE (ICD-715.96)  Her updated medication list for this problem includes:    Diclofenac Sodium 75 Mg Tbec (Diclofenac sodium) .Marland Kitchen... 1 tab q 12 as needed treat as above, f/u any worsening signs or symptoms , declines further ortho evaluation today as well   Complete Medication List: 1)  Zestril 40 Mg Tabs (Lisinopril) .... Take 2 every bedtime 2)  Lasix 20 Mg Tabs (Furosemide) .... Take one every morning 3)  Clonidine Hcl 0.2 Mg Tabs (Clonidine hcl) .... Take 1 tablet by mouth every morning 4)  Lidex 0.05 % Crea (Fluocinonide) .... Apply at bedtime 5)  Diclofenac Sodium 75 Mg Tbec (Diclofenac sodium) .Marland Kitchen.. 1 tab q 12 as needed 6)  Cyanocobalamin 1000 Mcg/ml Soln (Cyanocobalamin) .... Inject 1 cc weekly for 2 months. then inject 1cc once monthly thereafter. 7)  Bd Filter Needle/5 Micron Misc (Needles & syringes) .... Use to inject b12 8)  Bd Eclipse Syringe 25g X 5/8" 3 Ml Misc (Syringe/needle (disp)) .... Use as directed with b12  Patient Instructions: 1)  please return montly for your B12 shots - please schedule your next nurse vitis for this at the desk as you leave 2)  please call Dr Zannie Cove to see if she is going to fill out the [employer] forms  -   if not, have the [employer] forms to [phone] 3)  please schedule the  bone density test before leaving today 4)  Please schedule a follow-up appointment in Nov 2011 with CPX labs

## 2010-04-22 NOTE — Letter (Signed)
Summary: [facility]  [facility]   Imported By: Sherian Rein 06/20/2009 11:00:31  _____________________________________________________________________  External Attachment:    Type:   Image     Comment:   External Document

## 2010-04-22 NOTE — Letter (Signed)
Summary: Evaluation for Mobility Device/[employer]  Evaluation for Mobility Device/[employer]   Imported By: Maryln Gottron 07/04/2009 11:01:07  _____________________________________________________________________  External Attachment:    Type:   Image     Comment:   External Document

## 2010-04-22 NOTE — Progress Notes (Signed)
Summary: [facility] is wanting to set up Eval for Power Chair  Phone Note From [employer] back at [employer] [phone] Call back at [phone] ext [phone] [place]   Caller: [facility] [employer] [facility] [employer] Summary of Call: The [employer] is wanted to set up and eval for a power chair.   Please advise.  Initial call taken by: Lucy Antigua,  April 16, 2009 12:02 PM  Follow-up for Phone Call        [facility] the  folks to do the evaluation for motorized wheelchairs.  Please consult withthem  to determine her needs Follow-up by: Roderick Pee MD,  April 16, 2009 1:44 PM  Additional Follow-up for Phone Call Additional follow up Details #1::        patient would like to go to physical therapy. okay to order? Additional Follow-up by: Kern Reap CMA Duncan Dull),  April 16, 2009 3:32 PM    Additional Follow-up for Phone Call Additional follow up Details #2::    yes Follow-up by: Roderick Pee MD,  April 16, 2009 6:06 PM   Appended Document: Orders Update    Clinical Lists Changes  Orders: Added new Referral order of Physical Therapy Referral (PT) - Signed

## 2010-04-22 NOTE — Progress Notes (Signed)
----   Converted from flag ---- ---- 06/14/2009 5:20 PM, Corwin Levins MD wrote: sure - ok to send rx to pharmacy as pt desires  ---- 06/14/2009 2:55 PM, Zella Ball Ewing wrote: Patient informed me when I gave her instruction sheet to her that a nurse from her church gives her B-12 shots monthly to her. Can she cont. this. ------------------------------  Sent rx to pharmacy

## 2010-04-22 NOTE — Letter (Signed)
Summary: [facility]  [facility]   Imported By: Sherian Rein 06/20/2009 10:59:25  _____________________________________________________________________  External Attachment:    Type:   Image     Comment:   External Document

## 2010-04-22 NOTE — Progress Notes (Signed)
Summary: [employer] req to sch ov re: eval done by PT  Phone Note From Other Clinic Call back at [phone] ext [phone] Rosalyn Gess   Caller: [employer]  - Agricultural engineer of Call: Pt is needing to sch an ov to discuss the eval that was done by physical therapy for motorized wheelchair. Please advise.  Initial call taken by: Lucy Antigua,  June 11, 2009 4:39 PM  Follow-up for Phone Call        tried to call patient but no answer Follow-up by: Kern Reap CMA Duncan Dull),  June 12, 2009 11:59 AM

## 2010-04-22 NOTE — Miscellaneous (Signed)
Summary: BONE DENSITY  Clinical Lists Changes  Orders: Added new Test order of T-Lumbar Vertebral Assessment (77082) - Signed 

## 2010-04-24 NOTE — Progress Notes (Signed)
Summary: Elevated BP  Phone Note Call from Patient Call back at Home Phone [phone]   Caller: Patient Summary of Call: Pt called stating that since yesterday her BP has been elevated. Today is was 204/85 at 3pm, pt reports being asymptomatic. Pt is requesting to increase Clonidine, please advise Initial call taken by: Margaret Pyle, CMA,  March 11, 2010 4:48 PM  Follow-up for Phone Call        ok for incr clonidine - as per emr Follow-up by: Corwin Levins MD,  March 11, 2010 4:57 PM  Additional Follow-up for Phone Call Additional follow up Details #1::        Pt advised and will call back if BP does not improve Additional Follow-up by: Margaret Pyle, CMA,  March 11, 2010 4:59 PM    New/Updated Medications: CLONIDINE HCL 0.3 MG TABS (CLONIDINE HCL) 1 by mouth two times a day Prescriptions: CLONIDINE HCL 0.3 MG TABS (CLONIDINE HCL) 1 by mouth two times a day  #60 x 11   Entered and Authorized by:   Corwin Levins MD   Signed by:   Corwin Levins MD on 03/11/2010   Method used:   [facility] [address] [address] [phone]* (retail)       [address]       [place], [place]  [address]       Ph: [phone] or [phone]       Fax: [phone]   RxID:   [id number]

## 2010-04-24 NOTE — Assessment & Plan Note (Signed)
Summary: elev bp/cd   Vital Signs:  Patient profile:   77 year old female Height:      67 inches Weight:      222 pounds BMI:     34.90 O2 Sat:      96 % on Room air Temp:     98.1 degrees F oral Pulse rate:   57 / minute BP sitting:   150 / 90  (left arm) Cuff size:   large  Vitals Entered By: Zella Ball Ewing CMA (AAMA) (March 06, 2010 10:20 AM)  O2 Flow:  Room air  CC: Elevated BP/RE   CC:  Elevated BP/RE.  History of Present Illness: here with family;  in wheelchair and does not want to try to be examined at the exam table today;  Pt denies CP, worsening sob, doe, wheezing, orthopnea, pnd, worsening LE edema, palps,  or syncope but has sense of dizziness that is hard to characterize o/w, and not really able to say whether she thinks more c/w vertigo, faint or offbalance;  Pt denies other new neuro symptoms such as headache, facial or extremity weakness Pt denies polydipsia, polyuria   Overall good compliance with meds, trying to follow low chol diet, wt stable, little excercise however .  No leg swelling today and pt states has not seen swelling for several wks.  State she think she does drink fluids, though son thinks she does not drink enough.  Has somewhat reduced appetite, but No fever, wt loss, night sweats,  or other constitutional symptoms .  Is taking the lipitor for the past 10 days without myalgias, constipation or confusion, but despite this she wants the statin changed.  BP was elevated 214/89 yesterday, but usual BP at home more like today.  Denies worsening depressive symptoms, suicidal ideation, or panic.    Problems Prior to Update: 1)  Dizziness  (ICD-780.4) 2)  Hyperlipidemia  (ICD-272.4) 3)  Abnormality of Gait  (ICD-781.2) 4)  Lumbar Radiculopathy, Right  (ICD-724.4) 5)  Osteoarthritis, Knees, Bilateral, Severe  (ICD-715.96) 6)  Fracture, Wrist, Left  (ICD-814.00) 7)  Vitamin B12 Deficiency  (ICD-266.2) 8)  Anemia-nos  (ICD-285.9) 9)  Anemia, Pernicious   (ICD-281.0) 10)  Preventive Health Care  (ICD-V70.0) 11)  Eczema  (ICD-692.9) 12)  Degenerative Disc Disease  (ICD-722.6) 13)  Multiple Sclerosis  (ICD-340) 14)  Hypertension  (ICD-401.9)  Medications Prior to Update: 1)  Zestril 40 Mg  Tabs (Lisinopril) .... Take 2 Every Bedtime 2)  Lasix 20 Mg  Tabs (Furosemide) .... Take One Every Morning 3)  Clonidine Hcl 0.2 Mg  Tabs (Clonidine Hcl) .... Take 1 Tablet By Mouth Every Morning 4)  Lidex 0.05 %  Crea (Fluocinonide) .... Apply At Bedtime 5)  Diclofenac Sodium 75 Mg  Tbec (Diclofenac Sodium) .Marland Kitchen.. 1 Tab Q 12 As Needed 6)  Cyanocobalamin 1000 Mcg/ml Soln (Cyanocobalamin) .... Inject 1 Cc Weekly For 2 Months. Then Inject 1cc Once Monthly Thereafter. 7)  Bd Filter Needle/5 Micron  Misc (Needles & Syringes) .... Use To Inject B12 8)  Bd Eclipse Syringe 25g X 5/8" 3 Ml Misc (Syringe/needle (Disp)) .... Use As Directed With B12 9)  Ciprofloxacin Hcl 500 Mg Tabs (Ciprofloxacin Hcl) .Marland Kitchen.. 1 By Mouth Two Times A Day 10)  Lipitor 10 Mg Tabs (Atorvastatin Calcium) .Marland Kitchen.. 1po Once Daily 11)  Aspir-Low 81 Mg Tbec (Aspirin) .Marland Kitchen.. 1po Once Daily  Current Medications (verified): 1)  Zestril 40 Mg  Tabs (Lisinopril) .... Take 1 By Mouth Qam 2)  Lasix  20 Mg  Tabs (Furosemide) .... Take One Every Morning As Needed For Swelling 3)  Clonidine Hcl 0.2 Mg  Tabs (Clonidine Hcl) .... Take 1 Tablet By Mouth Two Times A Day 4)  Lidex 0.05 %  Crea (Fluocinonide) .... Apply At Bedtime 5)  Diclofenac Sodium 75 Mg  Tbec (Diclofenac Sodium) .Marland Kitchen.. 1 Tab Q 12 As Needed 6)  Cyanocobalamin 1000 Mcg/ml Soln (Cyanocobalamin) .... Inject 1 Cc Weekly For 2 Months. Then Inject 1cc Once Monthly Thereafter. 7)  Bd Filter Needle/5 Micron  Misc (Needles & Syringes) .... Use To Inject B12 8)  Bd Eclipse Syringe 25g X 5/8" 3 Ml Misc (Syringe/needle (Disp)) .... Use As Directed With B12 9)  Simvastatin 40 Mg Tabs (Simvastatin) .Marland Kitchen.. 1po Once Daily 10)  Aspir-Low 81 Mg Tbec (Aspirin) .Marland Kitchen.. 1po  Once Daily  Allergies (verified): 1)  Lipitor  Past History:  Past Medical History: Last updated: 02/04/2010 Hypertension ? MS  340.0 - no followed per Dr Bradd Burner, initial dx per pt 1974 degenerative disc disease - lumbar eczema recurrent falls/gait disorder Anemia-NOS vitamin B12 deficiency DJD - severe bilat knee/chronic pain chronic right lumbar L5-S1 radiculopathy by EMG ? medical compliance issue Hyperlipidemia  Past Surgical History: Last updated: 06/14/2009 s/p right wrist fracture 2005  Social History: Last updated: 06/14/2009 Retired - homemaker Married Never Smoked Alcohol use-no Drug use-no Regular exercise-no  Risk Factors: Exercise: no (12/09/2007)  Risk Factors: Smoking Status: never (01/30/2009)  Review of Systems       all otherwise negative per pt -    Physical Exam  General:  alert and overweight-appearing.   but fatigued Head:  normocephalic and atraumatic.   Eyes:  vision grossly intact, pupils equal, and pupils round.   Ears:  R ear normal and L ear normal.   Nose:  no external deformity and no nasal discharge.   Mouth:  no gingival abnormalities and pharynx pink and moist.   Neck:  supple and no masses.   Lungs:  normal respiratory effort and normal breath sounds.   Heart:  normal rate and regular rhythm.   Abdomen:  soft, non-tender, and normal bowel sounds.   Msk:  no acute joint tenderness and no joint swelling.  , no flank tender Extremities:  no edema, no erythema  Neurologic:  cranial nerves II-XII intact and strength normal in all extremities.  though exam difficult while seated in wheelchair; declines orthostatics today Skin:  color normal and no rashes.   Psych:  not anxious appearing and not depressed appearing.     Impression & Recommendations:  Problem # 1:  DIZZINESS (ICD-780.4) exam ass above essentially benign today , pt declines orthostatics today, but ? suspect overdiuresis - to take the lasix 20mg  as needed  for now; diff includes autonomic dysfunction related to the MS, vs other such as inner ear (but no evidence today)  Problem # 2:  HYPERLIPIDEMIA (ICD-272.4)  Her updated medication list for this problem includes:    Simvastatin 40 Mg Tabs (Simvastatin) .Marland Kitchen... 1po once daily ok to change the statin due to pt request, though I'm not sure it is actually the cause of some of her symtpoms;  treat as above, f/u any worsening signs or symptoms , f/u with labs next visit  Labs Reviewed: SGOT: 16 (01/29/2010)   SGPT: 13 (01/29/2010)   HDL:50.80 (01/29/2010), 39.5 (11/25/2006)  LDL:DEL (11/25/2006)  Chol:252 (01/29/2010), 254 (11/25/2006)  Trig:121.0 (01/29/2010), 155 (11/25/2006)  Problem # 3:  HYPERTENSION (ICD-401.9)  Her updated medication list  for this problem includes:    Zestril 40 Mg Tabs (Lisinopril) .Marland Kitchen... Take 1 by mouth qam    Lasix 20 Mg Tabs (Furosemide) .Marland Kitchen... Take one every morning as needed for swelling    Clonidine Hcl 0.2 Mg Tabs (Clonidine hcl) .Marland Kitchen... Take 1 tablet by mouth two times a day  BP today: 150/90 Prior BP: 140/80 (02/04/2010)  Labs Reviewed: K+: 4.5 (01/29/2010) Creat: : 0.9 (01/29/2010)   Chol: 252 (01/29/2010)   HDL: 50.80 (01/29/2010)   LDL: DEL (11/25/2006)   TG: 121.0 (01/29/2010) meds adjusted for uncontrolled pressure, ; treat as above, f/u any worsening signs or symptoms ,  also for f/u next visit  Complete Medication List: 1)  Zestril 40 Mg Tabs (Lisinopril) .... Take 1 by mouth qam 2)  Lasix 20 Mg Tabs (Furosemide) .... Take one every morning as needed for swelling 3)  Clonidine Hcl 0.2 Mg Tabs (Clonidine hcl) .... Take 1 tablet by mouth two times a day 4)  Lidex 0.05 % Crea (Fluocinonide) .... Apply at bedtime 5)  Diclofenac Sodium 75 Mg Tbec (Diclofenac sodium) .Marland Kitchen.. 1 tab q 12 as needed 6)  Cyanocobalamin 1000 Mcg/ml Soln (Cyanocobalamin) .... Inject 1 cc weekly for 2 months. then inject 1cc once monthly thereafter. 7)  Bd Filter Needle/5 Micron Misc  (Needles & syringes) .... Use to inject b12 8)  Bd Eclipse Syringe 25g X 5/8" 3 Ml Misc (Syringe/needle (disp)) .... Use as directed with b12 9)  Simvastatin 40 Mg Tabs (Simvastatin) .Marland Kitchen.. 1po once daily 10)  Aspir-low 81 Mg Tbec (Aspirin) .Marland Kitchen.. 1po once daily  Patient Instructions: 1)  decrease the lisinopril to 40 mg in the AM 2)  increase the clonidine to 0.2 mg two times a day 3)  stop the lipitor 4)  start the simvastatin 40 mg in the PM 5)  please change the fluid pill (furosemide) to "as needed" for leg swelling to see if this helps the dizziness 6)  Please schedule a follow-up appointment  in January on the day of your next Lab draw so we can also check your blood pressure Prescriptions: SIMVASTATIN 40 MG TABS (SIMVASTATIN) 1po once daily  #90 x 3   Entered and Authorized by:   Corwin Levins MD   Signed by:   Corwin Levins MD on 03/06/2010   Method used:   [facility]  [address] [phone]* (retail)       [address]       [place], [place]  [address]       Ph: [phone] or [phone]       Fax: [phone]   RxID:   [id number] ZESTRIL 40 MG  TABS (LISINOPRIL) take 1 by mouth qam  #90 x 3   Entered and Authorized by:   Corwin Levins MD   Signed by:   Corwin Levins MD on 03/06/2010   Method used:   [facility] to        [facility] [phone]* (retail)       [address]       [place], [place]  [address]       Ph: [phone] or [phone]       Fax: [phone]   RxID:   [id number] CLONIDINE HCL 0.2 MG  TABS (CLONIDINE HCL) Take 1 tablet by mouth two times a  day  #180 x 3   Entered and Authorized by:   Corwin Levins MD   Signed by:   Corwin Levins MD on 03/06/2010   Method used:   [facility]ronically to        [facility] [address] [phone]* (retail)       [address]       [place], [place]  [address]       Ph: [phone] or [phone]       Fax:  [phone]   RxID:   [id number]    Orders Added: 1)  Est. Patient Level IV [67893]

## 2010-04-24 NOTE — Progress Notes (Signed)
  Phone Note Refill Request Message from:  Fax from Pharmacy on March 07, 2010 4:43 PM  Refills Requested: Medication #1:  LASIX 20 MG  TABS take one every morning as needed for swelling   Dosage confirmed as above?Dosage Confirmed   Notes[facility] [address] Initial call taken by: Robin Ewing CMA Duncan Dull),  March 07, 2010 4:44 PM    Prescriptions: LASIX 20 MG  TABS (FUROSEMIDE) take one every morning as needed for swelling  #30 x 11   Entered by:   Zella Ball Ewing CMA (AAMA)   Authorized by:   Corwin Levins MD   Signed by:   Scharlene Gloss CMA (AAMA) on 03/07/2010   Method used:   [facility]ed to ...       [facility]  [address] [phone]* (retail)       [address]       [place], [place]  [id number]       Ph: [phone] or [phone]       Fax: [phone]   RxID:   [id number]

## 2010-04-24 NOTE — Progress Notes (Signed)
  Phone Note Call from Patient Call back at [facility] Phone [phone]   Caller: Patient Call For: dr Jonny Ruiz Summary of Call: blood pressure running high, 214/84. Pt taking cholesterol/bp meds. Pt cannot get bp down. Please advise.  Initial call taken by: Verdell Face,  March 05, 2010 2:38 PM  Follow-up for Phone Call        called pt., informed that she needs to schedule office visit with Dr. Jonny Ruiz. She did schedule for 03/06/2010 at 10:15 with JWJ. Follow-up by: Zella Ball Ewing CMA Duncan Dull),  March 05, 2010 3:16 PM

## 2010-04-24 NOTE — Assessment & Plan Note (Signed)
Summary: FU/NWS  #   Vital Signs:  Patient profile:   77 year old female Height:      67 inches Weight:      230 pounds BMI:     36.15 O2 Sat:      95 % on Room air Temp:     97.5 degrees F oral Pulse rate:   46 / minute BP sitting:   132 / 82  (left arm) Cuff size:   large  Vitals Entered By: Zella Ball Ewing CMA (AAMA) (April 01, 2010 8:52 AM)  O2 Flow:  Room air CC: followup/RE   CC:  followup/RE.  History of Present Illness: here to f/u; overall doing ok,  Pt denies CP, worsening sob, doe, wheezing, orthopnea, pnd, worsening LE edema, palps, dizziness or syncope  Pt denies new neuro symptoms such as headache, facial or worsening  extremity weakness  Pt denies polydipsia, polyuria  Overall good compliance with meds, trying to follow low chol diet, wt stable, little excercise however . No overt bleeding or bruising.  Overall good compliance with meds, and good tolerability.  Denies worsening depressive symptoms, suicidal ideation, or panic.   No fever, wt loss, night sweats, loss of appetite or other constitutional symptoms   Nsiad not working for knee pain and seems to make BP elevated as well.    Problems Prior to Update: 1)  Dizziness  (ICD-780.4) 2)  Hyperlipidemia  (ICD-272.4) 3)  Abnormality of Gait  (ICD-781.2) 4)  Lumbar Radiculopathy, Right  (ICD-724.4) 5)  Osteoarthritis, Knees, Bilateral, Severe  (ICD-715.96) 6)  Fracture, Wrist, Left  (ICD-814.00) 7)  Vitamin B12 Deficiency  (ICD-266.2) 8)  Anemia-nos  (ICD-285.9) 9)  Anemia, Pernicious  (ICD-281.0) 10)  Preventive Health Care  (ICD-V70.0) 11)  Eczema  (ICD-692.9) 12)  Degenerative Disc Disease  (ICD-722.6) 13)  Multiple Sclerosis  (ICD-340) 14)  Hypertension  (ICD-401.9)  Medications Prior to Update: 1)  Zestril 40 Mg  Tabs (Lisinopril) .... Take 1 By Mouth Qam 2)  Lasix 20 Mg  Tabs (Furosemide) .... Take One Every Morning As Needed For Swelling 3)  Clonidine Hcl 0.3 Mg Tabs (Clonidine Hcl) .Marland Kitchen.. 1 By Mouth Two  Times A Day 4)  Lidex 0.05 %  Crea (Fluocinonide) .... Apply At Bedtime 5)  Diclofenac Sodium 75 Mg  Tbec (Diclofenac Sodium) .Marland Kitchen.. 1 Tab Q 12 As Needed 6)  Cyanocobalamin 1000 Mcg/ml Soln (Cyanocobalamin) .... Inject 1 Cc Weekly For 2 Months. Then Inject 1cc Once Monthly Thereafter. 7)  Bd Filter Needle/5 Micron  Misc (Needles & Syringes) .... Use To Inject B12 8)  Bd Eclipse Syringe 25g X 5/8" 3 Ml Misc (Syringe/needle (Disp)) .... Use As Directed With B12 9)  Simvastatin 40 Mg Tabs (Simvastatin) .Marland Kitchen.. 1po Once Daily 10)  Aspir-Low 81 Mg Tbec (Aspirin) .Marland Kitchen.. 1po Once Daily  Current Medications (verified): 1)  Zestril 40 Mg  Tabs (Lisinopril) .... Take 1 By Mouth Qam 2)  Lasix 20 Mg  Tabs (Furosemide) .... Take One Every Morning As Needed For Swelling 3)  Clonidine Hcl 0.3 Mg Tabs (Clonidine Hcl) .Marland Kitchen.. 1 By Mouth Two Times A Day 4)  Lidex 0.05 %  Crea (Fluocinonide) .... Apply At Bedtime 5)  Tramadol Hcl 50 Mg Tabs (Tramadol Hcl) .Marland Kitchen.. 1-2 By Mouth Q 6 Hrs As Needed 6)  Cyanocobalamin 1000 Mcg/ml Soln (Cyanocobalamin) .... Inject 1 Cc Weekly For 2 Months. Then Inject 1cc Once Monthly Thereafter. 7)  Bd Filter Needle/5 Micron  Misc (Needles & Syringes) .... Use To  Inject B12 8)  Bd Eclipse Syringe 25g X 5/8" 3 Ml Misc (Syringe/needle (Disp)) .... Use As Directed With B12 9)  Simvastatin 40 Mg Tabs (Simvastatin) .Marland Kitchen.. 1po Once Daily 10)  Aspir-Low 81 Mg Tbec (Aspirin) .Marland Kitchen.. 1po Once Daily  Allergies (verified): 1)  Lipitor  Past History:  Past Medical History: Last updated: 02/04/2010 Hypertension ? MS  340.0 - no followed per Dr Bradd Burner, initial dx per pt 1974 degenerative disc disease - lumbar eczema recurrent falls/gait disorder Anemia-NOS vitamin B12 deficiency DJD - severe bilat knee/chronic pain chronic right lumbar L5-S1 radiculopathy by EMG ? medical compliance issue Hyperlipidemia  Past Surgical History: Last updated: 06/14/2009 s/p right wrist fracture 2005  Social  History: Last updated: 06/14/2009 Retired - homemaker Married Never Smoked Alcohol use-no Drug use-no Regular exercise-no  Risk Factors: Exercise: no (12/09/2007)  Risk Factors: Smoking Status: never (01/30/2009)  Review of Systems       all otherwise negative per pt -    Physical Exam  General:  alert and overweight-appearing.   but fatigued Head:  normocephalic and atraumatic.   Eyes:  vision grossly intact, pupils equal, and pupils round.   Ears:  R ear normal and L ear normal.   Nose:  no external deformity and no nasal discharge.   Mouth:  no gingival abnormalities and pharynx pink and moist.   Neck:  supple and no masses.   Lungs:  normal respiratory effort and normal breath sounds.   Heart:  normal rate and regular rhythm.   Abdomen:  soft, non-tender, and normal bowel sounds.   Msk:  no acute joint tenderness and no joint swelling.  , no flank tender, though has severe left knee bony arhtritic changes Extremities:  no edema, no erythema  Neurologic:  cranial nerves II-XII intact and strength normal in all extremities.  though exam difficult while seated in wheelchair   Impression & Recommendations:  Problem # 1:  HYPERTENSION (ICD-401.9)  Her updated medication list for this problem includes:    Zestril 40 Mg Tabs (Lisinopril) .Marland Kitchen... Take 1 by mouth qam    Lasix 20 Mg Tabs (Furosemide) .Marland Kitchen... Take one every morning as needed for swelling    Clonidine Hcl 0.3 Mg Tabs (Clonidine hcl) .Marland Kitchen... 1 by mouth two times a day  BP today: 132/82 Prior BP: 150/90 (03/06/2010)  Labs Reviewed: K+: 4.5 (01/29/2010) Creat: : 0.9 (01/29/2010)   Chol: 252 (01/29/2010)   HDL: 50.80 (01/29/2010)   LDL: DEL (11/25/2006)   TG: 121.0 (01/29/2010) improved, Continue all previous medications as before this visit , tolerating meds well   Problem # 2:  HYPERLIPIDEMIA (ICD-272.4)  Her updated medication list for this problem includes:    Simvastatin 40 Mg Tabs (Simvastatin) .Marland Kitchen... 1po  once daily  Labs Reviewed: SGOT: 16 (01/29/2010)   SGPT: 13 (01/29/2010)   HDL:50.80 (01/29/2010), 39.5 (11/25/2006)  LDL:DEL (11/25/2006)  Chol:252 (01/29/2010), 254 (11/25/2006)  Trig:121.0 (01/29/2010), 155 (11/25/2006) stable overall by hx and exam, ok to continue meds/tx as is  - to f/u labs drawn this am  Problem # 3:  OSTEOARTHRITIS, KNEES, BILATERAL, SEVERE (ICD-715.96)  Her updated medication list for this problem includes:    Tramadol Hcl 50 Mg Tabs (Tramadol hcl) .Marland Kitchen... 1-2 by mouth q 6 hrs as needed    Aspir-low 81 Mg Tbec (Aspirin) .Marland Kitchen... 1po once daily d/c the nsaid, to try the tramadol as needed, at this point she is near non-ambulatory and non surgical candidate per pt due to MS  Problem #  4:  ANEMIA-NOS (ICD-285.9)  Her updated medication list for this problem includes:    Cyanocobalamin 1000 Mcg/ml Soln (Cyanocobalamin) ..... Inject 1 cc weekly for 2 months. then inject 1cc once monthly thereafter.  Hgb: 11.9 (01/29/2010)   Hct: 35.6 (01/29/2010)   Platelets: 318.0 (01/29/2010) RBC: 4.11 (01/29/2010)   RDW: 14.7 (01/29/2010)   WBC: 6.0 (01/29/2010) MCV: 86.7 (01/29/2010)   MCHC: 33.4 (01/29/2010) Iron: 50 (01/24/2009)   % Sat: 16.4 (01/24/2009) B12: 107 (01/24/2009)   Folate: 10.5 (01/24/2009)   TSH: 3.28 (01/29/2010) stable overall by hx and exam, ok to continue meds/tx as is   Complete Medication List: 1)  Zestril 40 Mg Tabs (Lisinopril) .... Take 1 by mouth qam 2)  Lasix 20 Mg Tabs (Furosemide) .... Take one every morning as needed for swelling 3)  Clonidine Hcl 0.3 Mg Tabs (Clonidine hcl) .Marland Kitchen.. 1 by mouth two times a day 4)  Lidex 0.05 % Crea (Fluocinonide) .... Apply at bedtime 5)  Tramadol Hcl 50 Mg Tabs (Tramadol hcl) .Marland Kitchen.. 1-2 by mouth q 6 hrs as needed 6)  Cyanocobalamin 1000 Mcg/ml Soln (Cyanocobalamin) .... Inject 1 cc weekly for 2 months. then inject 1cc once monthly thereafter. 7)  Bd Filter Needle/5 Micron Misc (Needles & syringes) .... Use to inject  b12 8)  Bd Eclipse Syringe 25g X 5/8" 3 Ml Misc (Syringe/needle (disp)) .... Use as directed with b12 9)  Simvastatin 40 Mg Tabs (Simvastatin) .Marland Kitchen.. 1po once daily 10)  Aspir-low 81 Mg Tbec (Aspirin) .Marland Kitchen.. 1po once daily  Patient Instructions: 1)  Please call the number on the [facility] Card for results of your testing of the cholesterol today 2)  stop the diclofenac for pain 3)  start the tramadol for pain as needed 4)  Continue all previous medications as before this visit 5)  Please schedule a follow-up appointment in 3 months 6)  Check your Blood Pressure regularly. Your goal is to be less than 140/90 most of the time 7)  Please call for any further refills you may need before your next visit Prescriptions: TRAMADOL HCL 50 MG TABS (TRAMADOL HCL) 1-2 by mouth q 6 hrs as needed  #120 x 2   Entered and Authorized by:   Corwin Levins MD   Signed by:   Corwin Levins MD on 04/01/2010   Method used:   Electronically to        [facility]  [phone]* (retail)       [address]       [place], [place]  [id number]       Ph: [phone] or [phone]       Fax: [phone]   RxID:   [id number] TRAMADOL HCL 50 MG TABS (TRAMADOL HCL) 1-2 by mouth q 6 hrs as needed  #120 x 2   Entered and Authorized by:   Corwin Levins MD   Signed by:   Corwin Levins MD on 04/01/2010   Method used:   Print then Give to Patient   RxID:   [id number]    Orders Added: 1)  Est. Patient Level IV [16606]

## 2010-05-29 ENCOUNTER — Other Ambulatory Visit: Payer: Self-pay | Admitting: *Deleted

## 2010-05-29 MED ORDER — CLONIDINE HCL 0.3 MG PO TABS: 0.3000 mg | ORAL_TABLET | Freq: Two times a day (BID) | ORAL | Status: DC

## 2010-07-01 ENCOUNTER — Encounter: Payer: Self-pay | Admitting: Internal Medicine

## 2010-07-01 ENCOUNTER — Ambulatory Visit (INDEPENDENT_AMBULATORY_CARE_PROVIDER_SITE_OTHER): Payer: Medicare Other | Admitting: Internal Medicine

## 2010-07-01 DIAGNOSIS — E538 Deficiency of other specified B group vitamins: Secondary | ICD-10-CM

## 2010-07-01 DIAGNOSIS — I10 Essential (primary) hypertension: Secondary | ICD-10-CM

## 2010-07-01 DIAGNOSIS — G35 Multiple sclerosis: Secondary | ICD-10-CM

## 2010-07-01 DIAGNOSIS — Z0001 Encounter for general adult medical examination with abnormal findings: Secondary | ICD-10-CM | POA: Insufficient documentation

## 2010-07-01 DIAGNOSIS — Z0000 Encounter for general adult medical examination without abnormal findings: Secondary | ICD-10-CM

## 2010-07-01 DIAGNOSIS — IMO0002 Reserved for concepts with insufficient information to code with codable children: Secondary | ICD-10-CM

## 2010-07-01 DIAGNOSIS — M1710 Unilateral primary osteoarthritis, unspecified knee: Secondary | ICD-10-CM

## 2010-07-01 DIAGNOSIS — E785 Hyperlipidemia, unspecified: Secondary | ICD-10-CM

## 2010-07-01 MED ORDER — CLONIDINE HCL 0.3 MG PO TABS: 0.3000 mg | ORAL_TABLET | Freq: Two times a day (BID) | ORAL | Status: DC

## 2010-07-01 MED ORDER — TRAMADOL HCL 50 MG PO TABS: 50.0000 mg | ORAL_TABLET | Freq: Four times a day (QID) | ORAL | Status: DC | PRN

## 2010-07-01 MED ORDER — SIMVASTATIN 40 MG PO TABS: 40.0000 mg | ORAL_TABLET | Freq: Every day | ORAL | Status: DC

## 2010-07-01 MED ORDER — CYANOCOBALAMIN 1000 MCG/ML IJ SOLN: 1000.0000 ug | INTRAMUSCULAR | Status: DC

## 2010-07-01 MED ORDER — LISINOPRIL 40 MG PO TABS: 40.0000 mg | ORAL_TABLET | Freq: Every day | ORAL | Status: DC

## 2010-07-01 NOTE — Assessment & Plan Note (Signed)
stable overall by hx and exam, most recent lab reviewed with pt, and pt to continue medical treatment as before  Lab Results  Component Value Date   LDLCALC 71 04/01/2010

## 2010-07-01 NOTE — Assessment & Plan Note (Signed)
For monthly b12 today

## 2010-07-01 NOTE — Assessment & Plan Note (Signed)
Exam not done in detail but overall stable - Continue all other medications as before

## 2010-07-01 NOTE — Patient Instructions (Signed)
Continue all other medications as before Please return in 6 mo with Lab testing done 3-5 days before  

## 2010-07-01 NOTE — Progress Notes (Signed)
Subjective:    Patient ID: Deanna Wall, female    DOB: [birth year], 77 y.o.   MRN: [id number]  HPI  Here to f/u;  BP has been somewhat more labile recently but denies acute illness, and has been ok until recently in the lower 130's;  ;  Worse to eating pork which she does fairly often but no extra salt used; uses salt substitute/low salt but did eat ham for easter as well;  Knee pain overall about the same but better controlled with tramadol instead of the nsaid;  Pt denies chest pain, increased sob or doe, wheezing, orthopnea, PND, increased LE swelling, palpitations, dizziness or syncope.Pt denies new neurological symptoms such as new headache, or facial or extremity weakness or numbness.   Pt denies polydipsia, polyuria   Pt states overall good compliance with meds, trying to follow lower cholesterol, diabetic diet, wt overall stable but little exercise however as she is wheelchair bound due to severe knee pain and MS , overall no change.  Denies worsening depressive symptoms, suicidal ideation, or panic, though has ongoing anxiety, not increased recently.  Overall good compliance with treatment, and good medicine tolerability.     Pt denies fever, wt loss, night sweats, loss of appetite, or other constitutional symptoms No other new complaints Past Medical History  Diagnosis Date  . Multiple sclerosis 10/28/2006  . OSTEOARTHRITIS, KNEES, BILATERAL, SEVERE 06/14/2009  . HYPERLIPIDEMIA 02/16/2010  . ANEMIA, PERNICIOUS 01/30/2009  . HYPERTENSION 10/28/2006  . VITAMIN B12 DEFICIENCY 06/14/2009  . ECZEMA 10/28/2006  . DEGENERATIVE DISC DISEASE 10/28/2006   Past Surgical History  Procedure Date  . S/p right wrist fracture     reports that she has never smoked. She does not have any smokeless tobacco history on file. She reports that she does not drink alcohol or use illicit drugs. family history includes Arthritis in her mother and Bone cancer in her mother. Allergies  Allergen Reactions  .  Atorvastatin     REACTION: feels funny in face and head   Current Outpatient Prescriptions on File Prior to Visit  Medication Sig Dispense Refill  . aspirin 81 MG tablet Take 81 mg by mouth daily.        . furosemide (LASIX) 20 MG tablet Take 20 mg by mouth. Take one every morning as needed for swelling       . Needles & Syringes (B-D FILTER NEEDLE/5 MICRON) MISC by Does not apply route. Use to inject B12       . SYRINGE-NEEDLE, DISP, 3 ML (BD ECLIPSE SYRINGE) 25G X 5/8" 3 ML MISC by Does not apply route. Use as directed with B12       . DISCONTD: cloNIDine (CATAPRES) 0.3 MG tablet Take 1 tablet (0.3 mg total) by mouth 2 (two) times daily.  200 tablet  1  . DISCONTD: cyanocobalamin (,VITAMIN B-12,) 1000 MCG/ML injection Inject 1,000 mcg into the muscle. Inject 1 cc weekly for 2 months, then inject 1 cc once monthly thereafter.       Marland Kitchen DISCONTD: lisinopril (PRINIVIL,ZESTRIL) 40 MG tablet Take 40 mg by mouth. Take 1 by mouth every morning       . DISCONTD: simvastatin (ZOCOR) 40 MG tablet Take 40 mg by mouth daily.        Marland Kitchen DISCONTD: traMADol (ULTRAM) 50 MG tablet Take 50 mg by mouth every 6 (six) hours as needed.        . fluocinonide (LIDEX) 0.05 % cream Apply topically. Apply at bedtime  Review of Systems All otherwise neg per pt     Objective:   Physical ExamBP 142/78  Pulse 51  Temp(Src) 97.9 F (36.6 C) (Oral)  Ht 5\' 6"  (1.676 m)  Wt 225 lb (102.059 kg)  BMI 36.32 kg/m2  SpO2 95% Physical Exam  VS noted obese, in wheechair Constitutional: Pt appears well-developed and well-nourished.  HENT: Head: Normocephalic.  Right Ear: External ear normal.  Left Ear: External ear normal.  Eyes: Conjunctivae and EOM are normal. Pupils are equal, round, and reactive to light.  Neck: Normal range of motion. Neck supple.  Cardiovascular: Normal rate and regular rhythm.   Pulmonary/Chest: Effort normal and breath sounds normal.  Abd:  Soft, NT, non-distended, + BS Neurological: Pt is  alert. No cranial nerve deficit.  Skin: Skin is warm. No erythema.  Psychiatric: Pt behavior is normal. Thought content normal. 1+ nervous Bilat knees without effusion, NT but stable decreased ROM        Assessment & Plan:

## 2010-07-01 NOTE — Assessment & Plan Note (Signed)
Improved and stable overall by hx and exam, most recent lab reviewed with pt, and pt to continue medical treatment as before  

## 2010-07-01 NOTE — Assessment & Plan Note (Signed)
stable overall by hx and exam, most recent lab reviewed with pt, and pt to continue medical treatment as before  BP Readings from Last 3 Encounters:  07/01/10 142/78  04/01/10 132/82  03/06/10 150/90   Lab Results  Component Value Date   WBC 6.0 01/29/2010   HGB 11.9* 01/29/2010   HGB SMALL 01/29/2010   HCT 35.6* 01/29/2010   PLT 318.0 01/29/2010   CHOL 126 04/01/2010   TRIG 78.0 04/01/2010   HDL 39.80 04/01/2010   LDLDIRECT 180.0 01/29/2010   ALT 14 04/01/2010   AST 15 04/01/2010   NA 143 01/29/2010   K 4.5 01/29/2010   CL 108 01/29/2010   CREATININE 0.9 01/29/2010   BUN 10 01/29/2010   CO2 28 01/29/2010   TSH 3.28 01/29/2010

## 2010-07-07 ENCOUNTER — Encounter: Payer: Self-pay | Admitting: Internal Medicine

## 2010-12-25 ENCOUNTER — Other Ambulatory Visit (INDEPENDENT_AMBULATORY_CARE_PROVIDER_SITE_OTHER): Payer: Medicare Other

## 2010-12-25 ENCOUNTER — Telehealth: Payer: Self-pay | Admitting: Internal Medicine

## 2010-12-25 DIAGNOSIS — Z0000 Encounter for general adult medical examination without abnormal findings: Secondary | ICD-10-CM

## 2010-12-25 DIAGNOSIS — Z79899 Other long term (current) drug therapy: Secondary | ICD-10-CM

## 2010-12-25 LAB — URINALYSIS, ROUTINE W REFLEX MICROSCOPIC
Nitrite: NEGATIVE
Specific Gravity, Urine: 1.01 (ref 1.000–1.030)
pH: 6 (ref 5.0–8.0)

## 2010-12-25 LAB — BASIC METABOLIC PANEL
BUN: 14 mg/dL (ref 6–23)
Calcium: 9.6 mg/dL (ref 8.4–10.5)
Creatinine, Ser: 1 mg/dL (ref 0.4–1.2)
GFR: 72.46 mL/min (ref >60.00)
Glucose, Bld: 116 mg/dL — ABNORMAL HIGH (ref 70–99)
Potassium: 5.5 mEq/L — ABNORMAL HIGH (ref 3.5–5.1)

## 2010-12-25 LAB — LIPID PANEL
Cholesterol: 156 mg/dL (ref 0–200)
HDL: 52 mg/dL (ref >39.00)
Triglycerides: 103 mg/dL (ref 0.0–149.0)
VLDL: 20.6 mg/dL (ref 0.0–40.0)

## 2010-12-25 LAB — TSH: TSH: 3.17 u[IU]/mL (ref 0.35–5.50)

## 2010-12-25 LAB — CBC WITH DIFFERENTIAL/PLATELET
Eosinophils Relative: 0.9 % (ref 0.0–5.0)
HCT: 37.1 % (ref 36.0–46.0)
Lymphs Abs: 2.1 10*3/uL (ref 0.7–4.0)
Monocytes Relative: 7.8 % (ref 3.0–12.0)
Neutrophils Relative %: 54.6 % (ref 43.0–77.0)
Platelets: 286 10*3/uL (ref 150.0–400.0)
WBC: 5.9 10*3/uL (ref 4.5–10.5)

## 2010-12-25 LAB — HEPATIC FUNCTION PANEL
AST: 21 U/L (ref 0–37)
Albumin: 3.9 g/dL (ref 3.5–5.2)

## 2010-12-25 MED ORDER — CEPHALEXIN 500 MG PO CAPS: 500.0000 mg | ORAL_CAPSULE | Freq: Four times a day (QID) | ORAL | Status: DC

## 2010-12-25 NOTE — Telephone Encounter (Signed)
Done per emr 

## 2010-12-25 NOTE — Telephone Encounter (Signed)
Message copied by Corwin Levins on Thu Dec 25, 2010  6:02 PM ------      Message from: Scharlene Gloss B      Created: Thu Dec 25, 2010  3:42 PM       Called the patient and she has had some burning.

## 2010-12-26 ENCOUNTER — Telehealth: Payer: Self-pay | Admitting: Internal Medicine

## 2010-12-26 MED ORDER — CIPROFLOXACIN HCL 500 MG PO TABS: 500.0000 mg | ORAL_TABLET | Freq: Two times a day (BID) | ORAL | Status: DC

## 2010-12-26 NOTE — Telephone Encounter (Signed)
cipro done per emr  Please notify pt

## 2010-12-26 NOTE — Telephone Encounter (Signed)
Message copied by Corwin Levins on Fri Dec 26, 2010  6:02 PM ------      Message from: Scharlene Gloss B      Created: Thu Dec 25, 2010  3:42 PM       Called the patient and she has had some burning.

## 2010-12-26 NOTE — Telephone Encounter (Signed)
busy x 3 attempts

## 2010-12-29 NOTE — Telephone Encounter (Signed)
Patient informed, she got cipro on Friday.

## 2010-12-31 ENCOUNTER — Encounter: Payer: Self-pay | Admitting: Internal Medicine

## 2010-12-31 ENCOUNTER — Ambulatory Visit (INDEPENDENT_AMBULATORY_CARE_PROVIDER_SITE_OTHER): Payer: Medicare Other | Admitting: Internal Medicine

## 2010-12-31 VITALS — BP 142/78 | HR 49 | Temp 98.0°F | Wt 230.0 lb

## 2010-12-31 DIAGNOSIS — IMO0002 Reserved for concepts with insufficient information to code with codable children: Secondary | ICD-10-CM

## 2010-12-31 DIAGNOSIS — Z136 Encounter for screening for cardiovascular disorders: Secondary | ICD-10-CM

## 2010-12-31 DIAGNOSIS — R7309 Other abnormal glucose: Secondary | ICD-10-CM

## 2010-12-31 DIAGNOSIS — M1710 Unilateral primary osteoarthritis, unspecified knee: Secondary | ICD-10-CM

## 2010-12-31 DIAGNOSIS — Z23 Encounter for immunization: Secondary | ICD-10-CM

## 2010-12-31 DIAGNOSIS — Z0000 Encounter for general adult medical examination without abnormal findings: Secondary | ICD-10-CM

## 2010-12-31 DIAGNOSIS — R7302 Impaired glucose tolerance (oral): Secondary | ICD-10-CM

## 2010-12-31 DIAGNOSIS — G35 Multiple sclerosis: Secondary | ICD-10-CM

## 2010-12-31 HISTORY — DX: Impaired glucose tolerance (oral): R73.02

## 2010-12-31 MED ORDER — PNEUMOCOCCAL VAC POLYVALENT 25 MCG/0.5ML IJ INJ
0.5000 mL | INJECTION | Freq: Once | INTRAMUSCULAR | Status: AC
  Administered 2010-12-31: 0.5 mL via INTRAMUSCULAR

## 2010-12-31 NOTE — Assessment & Plan Note (Addendum)
With severe impairment with ambulation; has not seen ortho/dr whitfield recently  Who was reluctant for surgury due to her MS - pt to f/u with neurology regarding Dr Bradd Burner opinion about whether she is candidate for gen anesthesia, o/w cont pain med

## 2010-12-31 NOTE — Progress Notes (Signed)
Subjective:    Patient ID: Deanna Wall, female    DOB: 07/02/1931, 77 y.o.   MRN: [id number]  HPI  Here for wellness and f/u;  Overall doing ok;  Pt denies CP, worsening SOB, DOE, wheezing, orthopnea, PND, worsening LE edema, palpitations, dizziness or syncope.  Pt denies neurological change such as new Headache, facial or extremity weakness.  Pt denies polydipsia, polyuria, or low sugar symptoms. Pt states overall good compliance with treatment and medications, good tolerability, and trying to follow lower cholesterol diet.  Pt denies worsening depressive symptoms, suicidal ideation or panic. No fever, wt loss, night sweats, loss of appetite, or other constitutional symptoms.  Pt states good ability with ADL's, low fall risk, home safety reviewed and adequate, no significant changes in hearing or vision, and occasionally active with exercise.  UTi symptoms improved on the cephalexin,  Due for flu shot, and pneumovax.  Does walk short distances in the home only with the walker.  Has seen orhtopedic for severe bilat knee DJD, as well as neurology (first Dr Orlin Hilding, more recently Dr Terrace Arabia;  Dr Orlin Hilding had told pt to avoid surgury with gen anesthesia due to MS). Past Medical History  Diagnosis Date  . Multiple sclerosis 10/28/2006  . OSTEOARTHRITIS, KNEES, BILATERAL, SEVERE 06/14/2009  . HYPERLIPIDEMIA 02/16/2010  . ANEMIA, PERNICIOUS 01/30/2009  . HYPERTENSION 10/28/2006  . VITAMIN B12 DEFICIENCY 06/14/2009  . ECZEMA 10/28/2006  . DEGENERATIVE DISC DISEASE 10/28/2006   Past Surgical History  Procedure Date  . S/p right wrist fracture     reports that she has never smoked. She does not have any smokeless tobacco history on file. She reports that she does not drink alcohol or use illicit drugs. family history includes Arthritis in her mother and Bone cancer in her mother. Allergies  Allergen Reactions  . Atorvastatin     REACTION: feels funny in face and head   Current Outpatient Prescriptions on File  Prior to Visit  Medication Sig Dispense Refill  . aspirin 81 MG tablet Take 81 mg by mouth daily.        . ciprofloxacin (CIPRO) 500 MG tablet Take 1 tablet (500 mg total) by mouth 2 (two) times daily.  14 tablet  0  . cloNIDine (CATAPRES) 0.3 MG tablet Take 1 tablet (0.3 mg total) by mouth 2 (two) times daily.  180 tablet  3  . cyanocobalamin (,VITAMIN B-12,) 1000 MCG/ML injection Inject 1 mL (1,000 mcg total) into the muscle every 30 (thirty) days. Inject 1 cc weekly for 2 months, then inject 1 cc once monthly thereafter.  1 mL  11  . furosemide (LASIX) 20 MG tablet Take 20 mg by mouth. Take one every morning as needed for swelling       . lisinopril (PRINIVIL,ZESTRIL) 40 MG tablet Take 1 tablet (40 mg total) by mouth daily. Take 1 by mouth every morning  90 tablet  3  . Needles & Syringes (B-D FILTER NEEDLE/5 MICRON) MISC by Does not apply route. Use to inject B12       . simvastatin (ZOCOR) 40 MG tablet Take 1 tablet (40 mg total) by mouth daily.  90 tablet  3  . SYRINGE-NEEDLE, DISP, 3 ML (BD ECLIPSE SYRINGE) 25G X 5/8" 3 ML MISC by Does not apply route. Use as directed with B12       . traMADol (ULTRAM) 50 MG tablet Take 1 tablet (50 mg total) by mouth every 6 (six) hours as needed.  120 tablet  5  Review of Systems Review of Systems  Constitutional: Negative for diaphoresis, activity change, appetite change and unexpected weight change.  HENT: Negative for hearing loss, ear pain, facial swelling, mouth sores and neck stiffness.   Eyes: Negative for pain, redness and visual disturbance.  Respiratory: Negative for shortness of breath and wheezing.   Cardiovascular: Negative for chest pain and palpitations.  Gastrointestinal: Negative for diarrhea, blood in stool, abdominal distention and rectal pain.  Genitourinary: Negative for hematuria, flank pain and decreased urine volume.  Musculoskeletal: Negative for myalgias and joint swelling.  Skin: Negative for color change and wound.    Neurological: Negative for syncope and numbness.  Hematological: Negative for adenopathy.  Psychiatric/Behavioral: Negative for hallucinations, self-injury, decreased concentration and agitation.     Objective:   Physical Exam BP 142/78  Pulse 49  Temp(Src) 98 F (36.7 C) (Oral)  Wt 230 lb (104.327 kg)  SpO2 96% Physical Exam  VS noted Constitutional: Pt is oriented to person, place, and time. Appears well-developed and well-nourished.  HENT:  Head: Normocephalic and atraumatic.  Right Ear: External ear normal.  Left Ear: External ear normal.  Nose: Nose normal.  Mouth/Throat: Oropharynx is clear and moist.  Eyes: Conjunctivae and EOM are normal. Pupils are equal, round, and reactive to light.  Neck: Normal range of motion. Neck supple. No JVD present. No tracheal deviation present.  Cardiovascular: Normal rate, regular rhythm, normal heart sounds and intact distal pulses.   Pulmonary/Chest: Effort normal and breath sounds normal.  Abdominal: Soft. Bowel sounds are normal. There is no tenderness.  Musculoskeletal: Normal range of motion. Exhibits no edema.  Lymphadenopathy:  Has no cervical adenopathy.  Neurological: Pt is alert and oriented to person, place, and time. Pt has normal reflexes. No cranial nerve deficit. O/w not done in detail Skin: Skin is warm and dry. No rash noted.  Psychiatric:  Has  normal mood and affect. Behavior is normal.  MSK: bilat knee crepitus    Assessment & Plan:

## 2010-12-31 NOTE — Patient Instructions (Addendum)
You had the flu shot today, and the pneumonia shot today Continue all other medications as before Please address with Dr Terrace Arabia if you can undergo general anesthesia for knee surgury Your EKG was OK today Please return in 6 mo with Lab testing done 3-5 days before

## 2010-12-31 NOTE — Assessment & Plan Note (Signed)
Asympt, to pay more attention to diet;   to f/u labs next visit

## 2010-12-31 NOTE — Assessment & Plan Note (Signed)
To f/u with neuro as planned

## 2010-12-31 NOTE — Assessment & Plan Note (Addendum)

## 2011-03-14 ENCOUNTER — Other Ambulatory Visit: Payer: Self-pay | Admitting: Internal Medicine

## 2011-04-08 ENCOUNTER — Encounter: Payer: Self-pay | Admitting: Internal Medicine

## 2011-04-08 ENCOUNTER — Ambulatory Visit (INDEPENDENT_AMBULATORY_CARE_PROVIDER_SITE_OTHER): Payer: Medicare Other | Admitting: Internal Medicine

## 2011-04-08 VITALS — BP 118/70 | HR 58 | Temp 97.8°F | Ht 66.0 in | Wt 230.0 lb

## 2011-04-08 DIAGNOSIS — L089 Local infection of the skin and subcutaneous tissue, unspecified: Secondary | ICD-10-CM | POA: Insufficient documentation

## 2011-04-08 DIAGNOSIS — R7302 Impaired glucose tolerance (oral): Secondary | ICD-10-CM

## 2011-04-08 DIAGNOSIS — I10 Essential (primary) hypertension: Secondary | ICD-10-CM

## 2011-04-08 DIAGNOSIS — L723 Sebaceous cyst: Secondary | ICD-10-CM

## 2011-04-08 DIAGNOSIS — R7309 Other abnormal glucose: Secondary | ICD-10-CM

## 2011-04-08 MED ORDER — DOXYCYCLINE HYCLATE 100 MG PO TABS: 100.0000 mg | ORAL_TABLET | Freq: Two times a day (BID) | ORAL | Status: AC

## 2011-04-08 NOTE — Patient Instructions (Addendum)
Take all new medications as prescribed Continue all other medications as before You will be contacted regarding the referral for: general surgury 

## 2011-04-09 ENCOUNTER — Encounter: Payer: Self-pay | Admitting: Internal Medicine

## 2011-04-09 NOTE — Assessment & Plan Note (Signed)
stable overall by hx and exam, most recent data reviewed with pt, and pt to continue medical treatment as before  BP Readings from Last 3 Encounters:  04/08/11 118/70  12/31/10 142/78  07/01/10 142/78

## 2011-04-09 NOTE — Progress Notes (Signed)
Subjective:    Patient ID: Deanna Wall, female    DOB: 07/28/1931, 78 y.o.   MRN: [id number]  HPI  Here with acute onset 3 days red, tender, sweling and drainage to the midsternal chest area, has been recurrent several times in the past  With I&D in the same place - . Pt denies chest pain, increased sob or doe, wheezing, orthopnea, PND, increased LE swelling, palpitations, dizziness or syncope.  Pt denies new neurological symptoms such as new headache, or facial or extremity weakness or numbness  Pt denies polydipsia, polyuria.  Here with family Past Medical History  Diagnosis Date  . Multiple sclerosis 10/28/2006  . OSTEOARTHRITIS, KNEES, BILATERAL, SEVERE 06/14/2009  . HYPERLIPIDEMIA 02/16/2010  . ANEMIA, PERNICIOUS 01/30/2009  . HYPERTENSION 10/28/2006  . VITAMIN B12 DEFICIENCY 06/14/2009  . ECZEMA 10/28/2006  . DEGENERATIVE DISC DISEASE 10/28/2006  . Impaired glucose tolerance 12/31/2010   Past Surgical History  Procedure Date  . S/p right wrist fracture     reports that she has never smoked. She does not have any smokeless tobacco history on file. She reports that she does not drink alcohol or use illicit drugs. family history includes Arthritis in her mother and Bone cancer in her mother. Allergies  Allergen Reactions  . Atorvastatin     REACTION: feels funny in face and head   Current Outpatient Prescriptions on File Prior to Visit  Medication Sig Dispense Refill  . aspirin 81 MG tablet Take 81 mg by mouth daily.        . cloNIDine (CATAPRES) 0.3 MG tablet TAKE 1 TABLET BY MOUTH TWO TIMES A DAY  60 tablet  8  . cyanocobalamin (,VITAMIN B-12,) 1000 MCG/ML injection Inject 1 mL (1,000 mcg total) into the muscle every 30 (thirty) days. Inject 1 cc weekly for 2 months, then inject 1 cc once monthly thereafter.  1 mL  11  . furosemide (LASIX) 20 MG tablet Take 20 mg by mouth. Take one every morning as needed for swelling       . lisinopril (PRINIVIL,ZESTRIL) 40 MG tablet Take 1 tablet  (40 mg total) by mouth daily. Take 1 by mouth every morning  90 tablet  3  . Needles & Syringes (B-D FILTER NEEDLE/5 MICRON) MISC by Does not apply route. Use to inject B12       . simvastatin (ZOCOR) 40 MG tablet Take 1 tablet (40 mg total) by mouth daily.  90 tablet  3  . SYRINGE-NEEDLE, DISP, 3 ML (BD ECLIPSE SYRINGE) 25G X 5/8" 3 ML MISC by Does not apply route. Use as directed with B12       . traMADol (ULTRAM) 50 MG tablet Take 1 tablet (50 mg total) by mouth every 6 (six) hours as needed.  120 tablet  5   Review of Systems Review of Systems  Constitutional: Negative for diaphoresis and unexpected weight change.  HENT: Negative for drooling and tinnitus.   Eyes: Negative for photophobia and visual disturbance.  Respiratory: Negative for choking and stridor.   Gastrointestinal: Negative for vomiting and blood in stool.  Genitourinary: Negative for hematuria and decreased urine volume.     Objective:   Physical Exam BP 118/70  Pulse 58  Temp(Src) 97.8 F (36.6 C) (Oral)  Ht 5\' 6"  (1.676 m)  Wt 230 lb (104.327 kg)  BMI 37.12 kg/m2  SpO2 93% Physical Exam  VS noted, not ill appaering, obese, in wheelchair today Constitutional: Pt appears well-developed and well-nourished.  HENT: Head: Normocephalic.  Right Ear: External ear normal.  Left Ear: External ear normal.  Eyes: Conjunctivae and EOM are normal. Pupils are equal, round, and reactive to light.  Neck: Normal range of motion. Neck supple.  Cardiovascular: Normal rate and regular rhythm.   Pulmonary/Chest: Effort normal and breath sounds normal.  Neurological: Pt is alert. No cranial nerve deficit.  Skin: Skin is warm. No erythema. 1.5 cm area mid sternal area approx t3 level with red, tender, swelling and small drainage, but no fluctuance Psychiatric: Pt behavior is normal. Thought content normal. not depressed or nervous    Assessment & Plan:

## 2011-04-09 NOTE — Assessment & Plan Note (Signed)
Mild but recurrent, for doxy course, tylenol prn, but refer to gen surgury for definitive management,  to f/u any worsening symptoms or concerns

## 2011-04-09 NOTE — Assessment & Plan Note (Signed)
stable overall by hx and exam, most recent data reviewed with pt, and pt to continue medical treatment as before  Lab Results  Component Value Date   WBC 5.9 12/25/2010   HGB 12.2 12/25/2010   HCT 37.1 12/25/2010   PLT 286.0 12/25/2010   GLUCOSE 116* 12/25/2010   CHOL 156 12/25/2010   TRIG 103.0 12/25/2010   HDL 52.00 12/25/2010   LDLDIRECT 180.0 01/29/2010   LDLCALC 83 12/25/2010   ALT 14 12/25/2010   AST 21 12/25/2010   NA 145 12/25/2010   K 5.5* 12/25/2010   CL 111 12/25/2010   CREATININE 1.0 12/25/2010   BUN 14 12/25/2010   CO2 28 12/25/2010   TSH 3.17 12/25/2010

## 2011-04-13 ENCOUNTER — Encounter (INDEPENDENT_AMBULATORY_CARE_PROVIDER_SITE_OTHER): Payer: Self-pay | Admitting: General Surgery

## 2011-04-13 ENCOUNTER — Ambulatory Visit (INDEPENDENT_AMBULATORY_CARE_PROVIDER_SITE_OTHER): Payer: Medicare Other | Admitting: General Surgery

## 2011-04-13 VITALS — BP 156/98 | HR 60 | Temp 97.8°F | Resp 20 | Ht 66.0 in | Wt 235.0 lb

## 2011-04-13 DIAGNOSIS — L723 Sebaceous cyst: Secondary | ICD-10-CM

## 2011-04-13 NOTE — Progress Notes (Signed)
Patient ID: Deanna Wall, female   DOB: 10/02/1931, 79 y.o.   MRN: [id number]  Chief Complaint  Patient presents with  . Other    new pt- eval infected seb cyst on chestt    HPI Deanna Wall is a 79 y.o. female.   HPI  79yo AAF referred by Dr John for chronic chest wall sebaceous cyst. She states she has had it for 15 years. She states she has had lanced four times. She denies any fevers or chills. It has been draining some. She denies any redness, weight loss, chest pain, SOB.   Past Medical History  Diagnosis Date  . Multiple sclerosis 10/28/2006  . OSTEOARTHRITIS, KNEES, BILATERAL, SEVERE 06/14/2009  . HYPERLIPIDEMIA 02/16/2010  . ANEMIA, PERNICIOUS 01/30/2009  . HYPERTENSION 10/28/2006  . VITAMIN B12 DEFICIENCY 06/14/2009  . ECZEMA 10/28/2006  . DEGENERATIVE DISC DISEASE 10/28/2006  . Impaired glucose tolerance 12/31/2010  . Sebaceous cyst     chest     Past Surgical History  Procedure Date  . Tubal ligation 1958    Family History  Problem Relation Age of Onset  . Bone cancer Mother   . Arthritis Mother   . Cancer Mother     bone    Social History History  Substance Use Topics  . Smoking status: Never Smoker   . Smokeless tobacco: Not on file  . Alcohol Use: No    Allergies  Allergen Reactions  . Atorvastatin     REACTION: feels funny in face and head    Current Outpatient Prescriptions  Medication Sig Dispense Refill  . aspirin 81 MG tablet Take 81 mg by mouth daily.        . cloNIDine (CATAPRES) 0.3 MG tablet TAKE 1 TABLET BY MOUTH TWO TIMES A DAY  60 tablet  8  . cyanocobalamin (,VITAMIN B-12,) 1000 MCG/ML injection Inject 1 mL (1,000 mcg total) into the muscle every 30 (thirty) days. Inject 1 cc weekly for 2 months, then inject 1 cc once monthly thereafter.  1 mL  11  . doxycycline (VIBRA-TABS) 100 MG tablet Take 1 tablet (100 mg total) by mouth 2 (two) times daily.  20 tablet  0  . furosemide (LASIX) 20 MG tablet Take 20 mg by mouth. Take one every  morning as needed for swelling       . lisinopril (PRINIVIL,ZESTRIL) 40 MG tablet Take 1 tablet (40 mg total) by mouth daily. Take 1 by mouth every morning  90 tablet  3  . Needles & Syringes (B-D FILTER NEEDLE/5 MICRON) MISC by Does not apply route. Use to inject B12       . simvastatin (ZOCOR) 40 MG tablet Take 1 tablet (40 mg total) by mouth daily.  90 tablet  3  . SYRINGE-NEEDLE, DISP, 3 ML (BD ECLIPSE SYRINGE) 25G X 5/8" 3 ML MISC by Does not apply route. Use as directed with B12       . traMADol (ULTRAM) 50 MG tablet Take 1 tablet (50 mg total) by mouth every 6 (six) hours as needed.  120 tablet  5    Review of Systems Review of Systems  Constitutional: Negative for fever, chills and activity change.  HENT: Negative for hearing loss and neck pain.   Eyes: Negative for photophobia and visual disturbance.  Respiratory: Negative for chest tightness and shortness of breath.   Cardiovascular: Negative for chest pain and leg swelling.  Gastrointestinal: Negative for abdominal pain, diarrhea and rectal pain.  Genitourinary: Negative for   dysuria and hematuria.  Skin:       See hpi  Neurological: Negative for seizures, light-headedness and headaches.  Hematological: Negative for adenopathy. Does not bruise/bleed easily.  Psychiatric/Behavioral: Negative for confusion.    Blood pressure 156/98, pulse 60, temperature 97.8 F (36.6 C), temperature source Temporal, resp. rate 20, height 5' 6" (1.676 m), weight 235 lb (106.595 kg).  Physical Exam Physical Exam  Vitals reviewed. Constitutional: She is oriented to person, place, and time. She appears well-developed and well-nourished. No distress.       Morbidly obese, sitting in wheelchair  HENT:  Head: Normocephalic and atraumatic.  Eyes: Conjunctivae are normal. No scleral icterus.  Neck: Normal range of motion. Neck supple. No tracheal deviation present. No thyromegaly present.  Cardiovascular: Normal rate, regular rhythm and normal  heart sounds.   Pulmonary/Chest: Effort normal and breath sounds normal. No respiratory distress. She has no wheezes.    Abdominal: Soft. She exhibits no distension.  Musculoskeletal: She exhibits edema (trace edema in b/l LE).  Neurological: She is alert and oriented to person, place, and time.  Skin: Skin is warm and dry.  Psychiatric: She has a normal mood and affect. Her behavior is normal. Thought content normal.    Data Reviewed Dr John's note  Assessment    Chest wall sebaceous cyst    Plan    We discussed the etiology and management of sebaceous cysts. We discussed incision and drainage versus surgical excision. We discussed definitive management of sebaceous cysts. We discussed risks and benefits of surgery including bleeding, infection, scarring, recurrence, anesthesia risks, hematoma/seroma risks, and typical postoperative course.   I believe we can do this under MAC.  The pt has elected to proceed with excision of chest wall sebaceous cyst  Deanna Wall Coiner, MD, FACS General, Bariatric, & Minimally Invasive Surgery [facility]        Deanna Wall M 04/13/2011, 6:33 PM    

## 2011-04-13 NOTE — Patient Instructions (Signed)
Epidermal Cyst An epidermal cyst is usually a small, painless lump under the skin. Cysts often occur on the face, neck, stomach, chest, or genitals. The cyst may be filled with a bad smelling paste. Do not pop your cyst. Popping the cyst can cause pain and puffiness (swelling). HOME CARE   Only take medicines as told by your doctor.   Take your medicine (antibiotics) as told. Finish it even if you start to feel better.  GET HELP RIGHT AWAY IF:  Your cyst is tender, red, or puffy.   You are not getting better, or you are getting worse.   You have any questions or concerns.  MAKE SURE YOU:  Understand these instructions.   Will watch your condition.   Will get help right away if you are not doing well or get worse.  Document Released: 04/16/2004 Document Revised: 11/19/2010 Document Reviewed: 09/15/2010 [employer] Patient Information 2012 [employer]. 

## 2011-04-14 ENCOUNTER — Encounter (HOSPITAL_BASED_OUTPATIENT_CLINIC_OR_DEPARTMENT_OTHER): Payer: Self-pay | Admitting: *Deleted

## 2011-04-14 ENCOUNTER — Encounter (HOSPITAL_BASED_OUTPATIENT_CLINIC_OR_DEPARTMENT_OTHER)
Admission: RE | Admit: 2011-04-14 | Discharge: 2011-04-14 | Disposition: A | Discharge status: Home / Self Care | Payer: Medicare Other | Source: Ambulatory Visit | Attending: General Surgery | Admitting: General Surgery

## 2011-04-14 ENCOUNTER — Ambulatory Visit
Admission: RE | Admit: 2011-04-14 | Discharge: 2011-04-14 | Disposition: A | Discharge status: Home / Self Care | Payer: Medicare Other | Source: Ambulatory Visit | Attending: Anesthesiology | Admitting: Anesthesiology

## 2011-04-14 LAB — CBC
MCH: 28.4 pg (ref 26.0–34.0)
MCHC: 34.7 g/dL (ref 30.0–36.0)
Platelets: 317 10*3/uL (ref 150–400)

## 2011-04-14 LAB — BASIC METABOLIC PANEL
BUN: 11 mg/dL (ref 6–23)
Calcium: 9.3 mg/dL (ref 8.4–10.5)
Creatinine, Ser: 0.73 mg/dL (ref 0.50–1.10)
GFR calc non Af Amer: 80 mL/min — ABNORMAL LOW (ref >90)
Glucose, Bld: 115 mg/dL — ABNORMAL HIGH (ref 70–99)

## 2011-04-14 LAB — DIFFERENTIAL
Basophils Relative: 1 % (ref 0–1)
Eosinophils Absolute: 0 10*3/uL (ref 0.0–0.7)
Eosinophils Relative: 0 % (ref 0–5)
Monocytes Relative: 9 % (ref 3–12)
Neutrophils Relative %: 59 % (ref 43–77)

## 2011-04-14 NOTE — Progress Notes (Signed)
To come in for lab and cxr

## 2011-04-16 ENCOUNTER — Encounter (HOSPITAL_BASED_OUTPATIENT_CLINIC_OR_DEPARTMENT_OTHER)
Admission: RE | Disposition: A | Discharge status: Home / Self Care | Payer: Self-pay | Source: Ambulatory Visit | Attending: General Surgery

## 2011-04-16 ENCOUNTER — Encounter (HOSPITAL_BASED_OUTPATIENT_CLINIC_OR_DEPARTMENT_OTHER): Payer: Self-pay | Admitting: *Deleted

## 2011-04-16 ENCOUNTER — Ambulatory Visit (HOSPITAL_BASED_OUTPATIENT_CLINIC_OR_DEPARTMENT_OTHER)
Admission: RE | Admit: 2011-04-16 | Discharge: 2011-04-16 | Disposition: A | Discharge status: Home / Self Care | Payer: Medicare Other | Source: Ambulatory Visit | Attending: General Surgery | Admitting: General Surgery

## 2011-04-16 ENCOUNTER — Encounter (HOSPITAL_BASED_OUTPATIENT_CLINIC_OR_DEPARTMENT_OTHER): Payer: Self-pay | Admitting: Anesthesiology

## 2011-04-16 ENCOUNTER — Ambulatory Visit (HOSPITAL_BASED_OUTPATIENT_CLINIC_OR_DEPARTMENT_OTHER): Payer: Medicare Other | Admitting: Anesthesiology

## 2011-04-16 ENCOUNTER — Telehealth (INDEPENDENT_AMBULATORY_CARE_PROVIDER_SITE_OTHER): Payer: Self-pay | Admitting: General Surgery

## 2011-04-16 DIAGNOSIS — Z01812 Encounter for preprocedural laboratory examination: Secondary | ICD-10-CM | POA: Insufficient documentation

## 2011-04-16 DIAGNOSIS — I10 Essential (primary) hypertension: Secondary | ICD-10-CM | POA: Insufficient documentation

## 2011-04-16 DIAGNOSIS — L723 Sebaceous cyst: Secondary | ICD-10-CM | POA: Insufficient documentation

## 2011-04-16 HISTORY — DX: Multiple sclerosis, unspecified: G35.D

## 2011-04-16 HISTORY — DX: Multiple sclerosis: G35

## 2011-04-16 SURGERY — CYST REMOVAL TRUNK
Anesthesia: Monitor Anesthesia Care | Wound class: Clean Contaminated

## 2011-04-16 MED ORDER — CHLORHEXIDINE GLUCONATE 4 % EX LIQD: 1.0000 "application " | Freq: Once | CUTANEOUS | Status: DC

## 2011-04-16 MED ORDER — FENTANYL CITRATE 0.05 MG/ML IJ SOLN: 25.0000 ug | INTRAMUSCULAR | Status: DC | PRN

## 2011-04-16 MED ORDER — BUPIVACAINE-EPINEPHRINE 0.25% -1:200000 IJ SOLN
INTRAMUSCULAR | Status: DC | PRN
  Administered 2011-04-16: 25 mL

## 2011-04-16 MED ORDER — OXYCODONE-ACETAMINOPHEN 5-325 MG PO TABS
1.0000 | ORAL_TABLET | Freq: Once | ORAL | Status: AC
  Administered 2011-04-16: 1 via ORAL

## 2011-04-16 MED ORDER — METOCLOPRAMIDE HCL 5 MG/ML IJ SOLN: 10.0000 mg | Freq: Once | INTRAMUSCULAR | Status: DC | PRN

## 2011-04-16 MED ORDER — OXYCODONE-ACETAMINOPHEN 5-325 MG PO TABS: 1.0000 | ORAL_TABLET | Freq: Four times a day (QID) | ORAL | Status: AC | PRN

## 2011-04-16 MED ORDER — LACTATED RINGERS IV SOLN
INTRAVENOUS | Status: DC
  Administered 2011-04-16 (×2): via INTRAVENOUS

## 2011-04-16 MED ORDER — FENTANYL CITRATE 0.05 MG/ML IJ SOLN
INTRAMUSCULAR | Status: DC | PRN
  Administered 2011-04-16: 50 ug via INTRAVENOUS
  Administered 2011-04-16 (×2): 25 ug via INTRAVENOUS

## 2011-04-16 MED ORDER — CEFAZOLIN SODIUM-DEXTROSE 2-3 GM-% IV SOLR
2.0000 g | INTRAVENOUS | Status: AC
  Administered 2011-04-16: 2 g via INTRAVENOUS

## 2011-04-16 MED ORDER — LIDOCAINE HCL (CARDIAC) 20 MG/ML IV SOLN
INTRAVENOUS | Status: DC | PRN
  Administered 2011-04-16: 60 mg via INTRAVENOUS

## 2011-04-16 MED ORDER — PROPOFOL 10 MG/ML IV EMUL
INTRAVENOUS | Status: DC | PRN
  Administered 2011-04-16: 100 ug/kg/min via INTRAVENOUS

## 2011-04-16 MED ORDER — MORPHINE SULFATE 2 MG/ML IJ SOLN: 0.0500 mg/kg | INTRAMUSCULAR | Status: DC | PRN

## 2011-04-16 SURGICAL SUPPLY — 53 items
ADH SKN CLS APL DERMABOND .7 (GAUZE/BANDAGES/DRESSINGS)
APL SKNCLS STERI-STRIP NONHPOA (GAUZE/BANDAGES/DRESSINGS)
BENZOIN TINCTURE PRP APPL 2/3 (GAUZE/BANDAGES/DRESSINGS) IMPLANT
BLADE HEX COATED 2.75 (ELECTRODE) ×1 IMPLANT
BLADE SURG 15 STRL LF DISP TIS (BLADE) ×1 IMPLANT
BLADE SURG 15 STRL SS (BLADE) ×2
BLADE SURG ROTATE 9660 (MISCELLANEOUS) IMPLANT
CANISTER SUCTION 1200CC (MISCELLANEOUS) IMPLANT
CHLORAPREP W/TINT 26ML (MISCELLANEOUS) ×2 IMPLANT
COVER MAYO STAND STRL (DRAPES) ×2 IMPLANT
COVER TABLE BACK 60X90 (DRAPES) ×2 IMPLANT
DECANTER SPIKE VIAL GLASS SM (MISCELLANEOUS) IMPLANT
DERMABOND ADVANCED (GAUZE/BANDAGES/DRESSINGS)
DERMABOND ADVANCED .7 DNX12 (GAUZE/BANDAGES/DRESSINGS) ×1 IMPLANT
DRAPE PED LAPAROTOMY (DRAPES) ×2 IMPLANT
DRAPE UTILITY XL STRL (DRAPES) ×2 IMPLANT
DRSG TEGADERM 4X4.75 (GAUZE/BANDAGES/DRESSINGS) IMPLANT
ELECT COATED BLADE 2.86 ST (ELECTRODE) IMPLANT
ELECT REM PT RETURN 9FT ADLT (ELECTROSURGICAL) ×2
ELECTRODE REM PT RTRN 9FT ADLT (ELECTROSURGICAL) ×1 IMPLANT
GAUZE PACKING IODOFORM 1/4X5 (PACKING) IMPLANT
GAUZE SPONGE 4X4 12PLY STRL LF (GAUZE/BANDAGES/DRESSINGS) IMPLANT
GLOVE BIO SURGEON STRL SZ7.5 (GLOVE) ×2 IMPLANT
GLOVE BIOGEL PI IND STRL 8 (GLOVE) ×1 IMPLANT
GLOVE BIOGEL PI INDICATOR 8 (GLOVE) ×1
GOWN PREVENTION PLUS XLARGE (GOWN DISPOSABLE) ×2 IMPLANT
GOWN PREVENTION PLUS XXLARGE (GOWN DISPOSABLE) ×2 IMPLANT
NDL HYPO 25X1 1.5 SAFETY (NEEDLE) ×1 IMPLANT
NEEDLE HYPO 25X1 1.5 SAFETY (NEEDLE) ×2 IMPLANT
NS IRRIG 1000ML POUR BTL (IV SOLUTION) IMPLANT
PACK BASIN DAY SURGERY FS (CUSTOM PROCEDURE TRAY) ×2 IMPLANT
PENCIL BUTTON HOLSTER BLD 10FT (ELECTRODE) ×2 IMPLANT
SLEEVE SCD COMPRESS KNEE MED (MISCELLANEOUS) ×2 IMPLANT
SPONGE GAUZE 4X4 12PLY (GAUZE/BANDAGES/DRESSINGS) IMPLANT
STRIP CLOSURE SKIN 1/2X4 (GAUZE/BANDAGES/DRESSINGS) IMPLANT
SUT ETHILON 2 0 FS 18 (SUTURE) IMPLANT
SUT ETHILON 3 0 PS 1 (SUTURE) ×2 IMPLANT
SUT ETHILON 4 0 PS 2 18 (SUTURE) IMPLANT
SUT MNCRL AB 4-0 PS2 18 (SUTURE) ×1 IMPLANT
SUT SILK 2 0 SH (SUTURE) IMPLANT
SUT VIC AB 2-0 SH 27 (SUTURE)
SUT VIC AB 2-0 SH 27XBRD (SUTURE) IMPLANT
SUT VICRYL 3-0 CR8 SH (SUTURE) IMPLANT
SUT VICRYL 4-0 PS2 18IN ABS (SUTURE) IMPLANT
SWAB CULTURE LIQ STUART DBL (MISCELLANEOUS) IMPLANT
SYR CONTROL 10ML LL (SYRINGE) ×2 IMPLANT
TOWEL OR 17X24 6PK STRL BLUE (TOWEL DISPOSABLE) ×2 IMPLANT
TOWEL OR NON WOVEN STRL DISP B (DISPOSABLE) ×2 IMPLANT
TUBE ANAEROBIC SPECIMEN COL (MISCELLANEOUS) IMPLANT
TUBE CONNECTING 20X1/4 (TUBING) IMPLANT
UNDERPAD 30X30 INCONTINENT (UNDERPADS AND DIAPERS) IMPLANT
WATER STERILE IRR 1000ML POUR (IV SOLUTION) ×1 IMPLANT
YANKAUER SUCT BULB TIP NO VENT (SUCTIONS) IMPLANT

## 2011-04-16 NOTE — Anesthesia Postprocedure Evaluation (Signed)
Anesthesia Post Note  Patient: Deanna Wall  Procedure(s) Performed:  CYST REMOVAL TRUNK - excision of chest wall sebaceous cyst  Anesthesia type: MAC  Patient location: PACU  Post pain: Pain level controlled  Post assessment: Patient's Cardiovascular Status Stable  Last Vitals:  Filed Vitals:   04/16/11 1400  BP: 155/67  Pulse: 47  Temp:   Resp: 14    Post vital signs: Reviewed and stable  Level of consciousness: alert  Complications: No apparent anesthesia complications

## 2011-04-16 NOTE — Telephone Encounter (Signed)
Appt made with patient 05/04/11@ 3:45pm.

## 2011-04-16 NOTE — Anesthesia Preprocedure Evaluation (Signed)
Anesthesia Evaluation  Patient identified by MRN, date of birth, ID band Patient awake    Reviewed: Allergy & Precautions, H&P , NPO status , Patient's Chart, lab work & pertinent test results, reviewed documented beta blocker date and time   Airway Mallampati: II TM Distance: >3 FB Neck ROM: full    Dental   Pulmonary neg pulmonary ROS,          Cardiovascular hypertension, On Medications + DOE     Neuro/Psych  Neuromuscular disease Negative Psych ROS   GI/Hepatic negative GI ROS, Neg liver ROS,   Endo/Other  Morbid obesity  Renal/GU negative Renal ROS  Genitourinary negative   Musculoskeletal   Abdominal   Peds  Hematology negative hematology ROS (+)   Anesthesia Other Findings See surgeon's H&P   Reproductive/Obstetrics negative OB ROS                           Anesthesia Physical Anesthesia Plan  ASA: III  Anesthesia Plan: MAC   Post-op Pain Management:    Induction: Intravenous  Airway Management Planned: Simple Face Mask  Additional Equipment:   Intra-op Plan:   Post-operative Plan:   Informed Consent: I have reviewed the patients History and Physical, chart, labs and discussed the procedure including the risks, benefits and alternatives for the proposed anesthesia with the patient or authorized representative who has indicated his/her understanding and acceptance.     Plan Discussed with: CRNA and Surgeon  Anesthesia Plan Comments:         Anesthesia Quick Evaluation

## 2011-04-16 NOTE — Anesthesia Procedure Notes (Signed)
Procedure Name: MAC Date/Time: 04/16/2011 12:49 PM Performed by: Gladys Damme Pre-anesthesia Checklist: Patient identified, Timeout performed, Emergency Drugs available, Suction available and Patient being monitored Patient Re-evaluated:Patient Re-evaluated prior to inductionOxygen Delivery Method: Simple face mask Intubation Type: IV induction Placement Confirmation: breath sounds checked- equal and bilateral and positive ETCO2

## 2011-04-16 NOTE — Transfer of Care (Signed)
Immediate Anesthesia Transfer of Care Note  Patient: Deanna Wall  Procedure(s) Performed:  CYST REMOVAL TRUNK - excision of chest wall sebaceous cyst  Patient Location: PACU  Anesthesia Type: MAC  Level of Consciousness: awake, alert  and oriented  Airway & Oxygen Therapy: Patient Spontanous Breathing and Patient connected to face mask oxygen  Post-op Assessment: Report given to PACU RN and Post -op Vital signs reviewed and stable  Post vital signs: Reviewed and stable  Complications: No apparent anesthesia complications

## 2011-04-16 NOTE — Interval H&P Note (Signed)
History and Physical Interval Note:  04/16/2011 12:32 PM  Deanna Wall  has presented today for surgery, with the diagnosis of chest wall sebaceous cyst  The various methods of treatment have been discussed with the patient and family. After consideration of risks, benefits and other options for treatment, the patient has consented to  Procedure(s): CYST REMOVAL TRUNK as a surgical intervention .  The patients' history has been reviewed, patient examined, no change in status, stable for surgery.  I have reviewed the patients' chart and labs.  Questions were answered to the patient's satisfaction.    Mary Sella. Andrey Campanile, MD, FACS General, Bariatric, & Minimally Invasive Surgery [facility], [place]   [facility] M

## 2011-04-16 NOTE — Telephone Encounter (Signed)
Message copied by Liliana Cline on Thu Apr 16, 2011  4:09 PM ------      Message from: Andrey Campanile, ERIC M      Created: Thu Apr 16, 2011  1:48 PM       This lady has stitches that will need to be removed. Try to squeeze her into my clinic on 2/11. Overbook if needed.             Thanks,      wilson

## 2011-04-16 NOTE — Op Note (Signed)
04/16/11  PREOPERATIVE DIAGNOSIS: sebaceous cyst of sternum  POSTOPERATIVE DIAGNOSIS: same  PROCEDURE: Excision of sternal sebaceous cyst with 1 layer closure  SURGEON: Atilano Ina, MD FACS  ANESTHESIA: MAC + 0.25% marcaine with epi  SPECIMEN: sebaceous cyst - discarded  FINDINGS: 4.5 cm incision, closed with interrupted 3-0 nylon sutures in 1 layer  INDICATIONS FOR PROCEDURE: This 78 year old African American female who presents with 15 year history of a sebaceous cyst on her chest wall. She states that it has been incised and drained on 4 previous occasions. She desired definitive surgical management of the cyst. We discussed the risk and benefits of surgical excision including but not limited to bleeding, infection, injury to surrounding structures, scarring, hematoma formation, seroma formation, anesthesia risk as well as cyst recurrence.  Description of procedure: After obtaining informed consent the patient was taken back to the operating room [facility] and placed supine on the operating room table. Monitored anesthesia care was established. Sequential compression devices were placed. Her chest wall was prepped and draped in the usual standard surgical fashion. She received IV antibiotics prior to skin incision. A surgical timeout was performed. The cyst measures approximately 1.5 x 1.5 cm. It was directly over her lower sternum between her breasts. After local had been infiltrated, an elliptical incision was made sharply with a 15 blade. Cyst was excised in its entirety. There is no remaining cyst wall. The cavity was irrigated. Hemostasis was achieved. Because the area was a little bit under tension, I elected to close the incision in a single layer using sutures. A vertical mattress suture was placed at the superior aspect of the incision as well as another vertical mattress in the inferior portion of the incision to take some tension off the wound. This was done using a  3-0 nylon. Then 6 interrupted 3-0 nylon sutures placed between the skin edges. A triple antibiotic was applied to the incision. A sterile dressing was then applied. The patient was taken to the recovery room in stable condition. All needle, instrument, and sponge counts were correct x2. There were no immediate complications.  EBL: Minimal  Mary Sella. Andrey Campanile, MD, FACS General, Bariatric, & Minimally Invasive Surgery [facility], [place]

## 2011-04-16 NOTE — H&P (View-Only) (Signed)
Patient ID: Deanna Wall, female   DOB: 04-May-1931, 78 y.o.   MRN: [id number]  Chief Complaint  Patient presents with  . Other    new pt- eval infected seb cyst on chestt    HPI Deanna Wall is a 78 y.o. female.   HPI  79yo AAF referred by Dr Deanna Wall for chronic chest wall sebaceous cyst. She states she has had it for 15 years. She states she has had lanced four times. She denies any fevers or chills. It has been draining some. She denies any redness, weight loss, chest pain, SOB.   Past Medical History  Diagnosis Date  . Multiple sclerosis 10/28/2006  . OSTEOARTHRITIS, KNEES, BILATERAL, SEVERE 06/14/2009  . HYPERLIPIDEMIA 02/16/2010  . ANEMIA, PERNICIOUS 01/30/2009  . HYPERTENSION 10/28/2006  . VITAMIN B12 DEFICIENCY 06/14/2009  . ECZEMA 10/28/2006  . DEGENERATIVE DISC DISEASE 10/28/2006  . Impaired glucose tolerance 12/31/2010  . Sebaceous cyst     chest     Past Surgical History  Procedure Date  . Tubal ligation 1958    Family History  Problem Relation Age of Onset  . Bone cancer Mother   . Arthritis Mother   . Cancer Mother     bone    Social History History  Substance Use Topics  . Smoking status: Never Smoker   . Smokeless tobacco: Not on file  . Alcohol Use: No    Allergies  Allergen Reactions  . Atorvastatin     REACTION: feels funny in face and head    Current Outpatient Prescriptions  Medication Sig Dispense Refill  . aspirin 81 MG tablet Take 81 mg by mouth daily.        . cloNIDine (CATAPRES) 0.3 MG tablet TAKE 1 TABLET BY MOUTH TWO TIMES A DAY  60 tablet  8  . cyanocobalamin (,VITAMIN B-12,) 1000 MCG/ML injection Inject 1 mL (1,000 mcg total) into the muscle every 30 (thirty) days. Inject 1 cc weekly for 2 months, then inject 1 cc once monthly thereafter.  1 mL  11  . doxycycline (VIBRA-TABS) 100 MG tablet Take 1 tablet (100 mg total) by mouth 2 (two) times daily.  20 tablet  0  . furosemide (LASIX) 20 MG tablet Take 20 mg by mouth. Take one every  morning as needed for swelling       . lisinopril (PRINIVIL,ZESTRIL) 40 MG tablet Take 1 tablet (40 mg total) by mouth daily. Take 1 by mouth every morning  90 tablet  3  . Needles & Syringes (B-D FILTER NEEDLE/5 MICRON) MISC by Does not apply route. Use to inject B12       . simvastatin (ZOCOR) 40 MG tablet Take 1 tablet (40 mg total) by mouth daily.  90 tablet  3  . SYRINGE-NEEDLE, DISP, 3 ML (BD ECLIPSE SYRINGE) 25G X 5/8" 3 ML MISC by Does not apply route. Use as directed with B12       . traMADol (ULTRAM) 50 MG tablet Take 1 tablet (50 mg total) by mouth every 6 (six) hours as needed.  120 tablet  5    Review of Systems Review of Systems  Constitutional: Negative for fever, chills and activity change.  HENT: Negative for hearing loss and neck pain.   Eyes: Negative for photophobia and visual disturbance.  Respiratory: Negative for chest tightness and shortness of breath.   Cardiovascular: Negative for chest pain and leg swelling.  Gastrointestinal: Negative for abdominal pain, diarrhea and rectal pain.  Genitourinary: Negative for  dysuria and hematuria.  Skin:       See hpi  Neurological: Negative for seizures, light-headedness and headaches.  Hematological: Negative for adenopathy. Does not bruise/bleed easily.  Psychiatric/Behavioral: Negative for confusion.    Blood pressure 156/98, pulse 60, temperature 97.8 F (36.6 C), temperature source Temporal, resp. rate 20, height 5\' 6"  (1.676 m), weight 235 lb (106.595 kg).  Physical Exam Physical Exam  Vitals reviewed. Constitutional: She is oriented to person, place, and time. She appears well-developed and well-nourished. No distress.       Morbidly obese, sitting in wheelchair  HENT:  Head: Normocephalic and atraumatic.  Eyes: Conjunctivae are normal. No scleral icterus.  Neck: Normal range of motion. Neck supple. No tracheal deviation present. No thyromegaly present.  Cardiovascular: Normal rate, regular rhythm and normal  heart sounds.   Pulmonary/Chest: Effort normal and breath sounds normal. No respiratory distress. She has no wheezes.    Abdominal: Soft. She exhibits no distension.  Musculoskeletal: She exhibits edema (trace edema in b/l LE).  Neurological: She is alert and oriented to person, place, and time.  Skin: Skin is warm and dry.  Psychiatric: She has a normal mood and affect. Her behavior is normal. Thought content normal.    Data Reviewed Dr Deanna Wall note  Assessment    Chest wall sebaceous cyst    Plan    We discussed the etiology and management of sebaceous cysts. We discussed incision and drainage versus surgical excision. We discussed definitive management of sebaceous cysts. We discussed risks and benefits of surgery including bleeding, infection, scarring, recurrence, anesthesia risks, hematoma/seroma risks, and typical postoperative course.   I believe we can do this under MAC.  The pt has elected to proceed with excision of chest wall sebaceous cyst  Deanna Wall. Andrey Campanile, MD, FACS General, Bariatric, & Minimally Invasive Surgery [facility], [place]        [facility] M 04/13/2011, 6:33 PM

## 2011-04-30 ENCOUNTER — Other Ambulatory Visit: Payer: Self-pay | Admitting: Internal Medicine

## 2011-05-04 ENCOUNTER — Encounter (INDEPENDENT_AMBULATORY_CARE_PROVIDER_SITE_OTHER): Payer: Self-pay | Admitting: General Surgery

## 2011-05-04 ENCOUNTER — Ambulatory Visit (INDEPENDENT_AMBULATORY_CARE_PROVIDER_SITE_OTHER): Payer: Medicare Other | Admitting: General Surgery

## 2011-05-04 VITALS — BP 140/86 | HR 70 | Temp 97.9°F | Resp 18 | Ht 67.0 in | Wt 230.0 lb

## 2011-05-04 DIAGNOSIS — Z09 Encounter for follow-up examination after completed treatment for conditions other than malignant neoplasm: Secondary | ICD-10-CM

## 2011-05-04 NOTE — Progress Notes (Signed)
Chief complaint: Followup  Procedure: Status post excision of chest wall sebaceous cyst January 24  History of Present Ilness: 78 year old [place] American female comes in today for her postoperative appointment. She denies any fevers or chills. She denies any drainage from the incision. She denies any redness around the incision. She states that it will occasionally itch. She denies any pain at the site. She reports normal bowel habits  Physical Exam: BP 140/86  Pulse 70  Temp(Src) 97.9 F (36.6 C) (Temporal)  Resp 18  Ht 5\' 7"  (1.702 m)  Wt 230 lb (104.327 kg)  BMI 36.02 kg/m2  Gen: alert, NAD, non-toxic appearing, sitting in wheelchair HEENT: normocephalic, atraumatic;  no scleral icterus, neck supple Pulm: Lungs clear to auscultation, symmetric chest rise CV: regular rate and rhythm Chest: well healed vertical incision. No hematoma. Sutures intact. No cellulitis.    Pathology: None  Assessment and Plan: Status post excision of chest wall sebaceous cyst  We removed her sutures today. The wound appears well healed. I informed the patient and her husband what we found during surgery.  Followup on a p.r.n. Basis  Mary Sella. Andrey Campanile, MD, FACS General, Bariatric, & Minimally Invasive Surgery [facility], [place]

## 2011-05-25 ENCOUNTER — Encounter: Payer: Self-pay | Admitting: Internal Medicine

## 2011-05-25 ENCOUNTER — Ambulatory Visit (INDEPENDENT_AMBULATORY_CARE_PROVIDER_SITE_OTHER): Payer: Medicare Other | Admitting: Internal Medicine

## 2011-05-25 ENCOUNTER — Other Ambulatory Visit (INDEPENDENT_AMBULATORY_CARE_PROVIDER_SITE_OTHER): Payer: Medicare Other

## 2011-05-25 VITALS — BP 154/78 | HR 86 | Temp 98.4°F

## 2011-05-25 DIAGNOSIS — E785 Hyperlipidemia, unspecified: Secondary | ICD-10-CM

## 2011-05-25 DIAGNOSIS — R062 Wheezing: Secondary | ICD-10-CM

## 2011-05-25 DIAGNOSIS — R7309 Other abnormal glucose: Secondary | ICD-10-CM

## 2011-05-25 DIAGNOSIS — R7302 Impaired glucose tolerance (oral): Secondary | ICD-10-CM

## 2011-05-25 DIAGNOSIS — Z0000 Encounter for general adult medical examination without abnormal findings: Secondary | ICD-10-CM

## 2011-05-25 DIAGNOSIS — R300 Dysuria: Secondary | ICD-10-CM

## 2011-05-25 DIAGNOSIS — J209 Acute bronchitis, unspecified: Secondary | ICD-10-CM

## 2011-05-25 LAB — URINALYSIS, ROUTINE W REFLEX MICROSCOPIC
Bilirubin Urine: NEGATIVE
Ketones, ur: NEGATIVE
Urine Glucose: NEGATIVE
Urobilinogen, UA: 0.2 (ref 0.0–1.0)

## 2011-05-25 LAB — BASIC METABOLIC PANEL
BUN: 10 mg/dL (ref 6–23)
CO2: 27 mEq/L (ref 19–32)
Calcium: 8.8 mg/dL (ref 8.4–10.5)
Creatinine, Ser: 0.9 mg/dL (ref 0.4–1.2)
GFR: 76.03 mL/min (ref >60.00)
Glucose, Bld: 115 mg/dL — ABNORMAL HIGH (ref 70–99)

## 2011-05-25 LAB — LIPID PANEL
Cholesterol: 149 mg/dL (ref 0–200)
HDL: 50.8 mg/dL (ref >39.00)
Triglycerides: 102 mg/dL (ref 0.0–149.0)
VLDL: 20.4 mg/dL (ref 0.0–40.0)

## 2011-05-25 LAB — HEMOGLOBIN A1C: Hgb A1c MFr Bld: 6.4 % (ref 4.6–6.5)

## 2011-05-25 MED ORDER — PREDNISONE 10 MG PO TABS: ORAL_TABLET | ORAL | Status: DC

## 2011-05-25 MED ORDER — LEVOFLOXACIN 250 MG PO TABS: 250.0000 mg | ORAL_TABLET | Freq: Every day | ORAL | Status: AC

## 2011-05-25 MED ORDER — HYDROCODONE-HOMATROPINE 5-1.5 MG/5ML PO SYRP: 5.0000 mL | ORAL_SOLUTION | Freq: Four times a day (QID) | ORAL | Status: AC | PRN

## 2011-05-25 MED ORDER — METHYLPREDNISOLONE ACETATE PF 80 MG/ML IJ SUSP
120.0000 mg | Freq: Once | INTRAMUSCULAR | Status: AC
  Administered 2011-05-25: 120 mg via INTRAMUSCULAR

## 2011-05-25 NOTE — Assessment & Plan Note (Signed)
Mild to mod, for antibx course,  to f/u any worsening symptoms or concerns 

## 2011-05-25 NOTE — Patient Instructions (Signed)
You had the steroid shot today for wheezing Take all new medications as prescribed - the antibiotic, cough medicine, and prednisone for wheezing Please go to LAB in the Basement for the blood and/or urine tests to be done today  Since you are having lab work today, we can cancel the April 12 appointment  Please return in 6 mo with Lab testing done 3-5 days before

## 2011-05-25 NOTE — Assessment & Plan Note (Signed)
Asympt,  to f/u any worsening symptoms or concerns, pt to call for cbg > 200 on steroid tx Lab Results  Component Value Date   HGBA1C 6.4 05/25/2011

## 2011-05-25 NOTE — Progress Notes (Signed)
Subjective:    Patient ID: [name], female    DOB: 05/09/1931, 78 y.o.   MRN: [id number]  HPI  Here with acute onset mild to mod 2-3 days ST, HA, general weakness and malaise, with prod cough greenish sputum, but Pt denies chest pain, increased sob or doe, wheezing, orthopnea, PND, increased LE swelling, palpitations, dizziness or syncope,m until onset mild wheezing/sob this am..  Pt denies new neurological symptoms such as new headache, or facial or extremity weakness or numbness   Pt denies polydipsia, polyuria,  Pt states overall good compliance with meds, trying to follow lower cholesterol, diabetic diet, wt overall stable but little exercise however.    Also with 2 days onset dysuria, with freq, but no urgency, hematuiria, flank pain, n/v, chills.  No other acute compalitns Past Medical History  Diagnosis Date  . Multiple sclerosis 10/28/2006  . OSTEOARTHRITIS, KNEES, BILATERAL, SEVERE 06/14/2009  . HYPERLIPIDEMIA 02/16/2010  . ANEMIA, PERNICIOUS 01/30/2009  . HYPERTENSION 10/28/2006  . VITAMIN B12 DEFICIENCY 06/14/2009  . ECZEMA 10/28/2006  . DEGENERATIVE DISC DISEASE 10/28/2006  . Impaired glucose tolerance 12/31/2010  . Sebaceous cyst     chest   . MS (multiple sclerosis)    Past Surgical History  Procedure Date  . Tubal ligation 1958  . Upper gastrointestinal endoscopy   . Colonoscopy     reports that she has never smoked. She has never used smokeless tobacco. She reports that she does not drink alcohol or use illicit drugs. family history includes Arthritis in her mother; Bone cancer in her mother; and Cancer in her mother. Allergies  Allergen Reactions  . Atorvastatin     REACTION: feels funny in face and head   Current Outpatient Prescriptions on File Prior to Visit  Medication Sig Dispense Refill  . aspirin 81 MG tablet Take 81 mg by mouth daily.        . cloNIDine (CATAPRES) 0.3 MG tablet TAKE 1 TABLET BY MOUTH TWO TIMES A DAY  60 tablet  8  . cyanocobalamin (,VITAMIN  B-12,) 1000 MCG/ML injection Inject 1 mL (1,000 mcg total) into the muscle every 30 (thirty) days. Inject 1 cc weekly for 2 months, then inject 1 cc once monthly thereafter.  1 mL  11  . furosemide (LASIX) 20 MG tablet Take 20 mg by mouth. Take one every morning as needed for swelling      . lisinopril (PRINIVIL,ZESTRIL) 40 MG tablet TAKE 1 TABLET BY MOUTH EVERY MORNING  90 tablet  3  . Needles & Syringes (B-D FILTER NEEDLE/5 MICRON) MISC by Does not apply route. Use to inject B12       . simvastatin (ZOCOR) 40 MG tablet Take 1 tablet (40 mg total) by mouth daily.  90 tablet  3  . SYRINGE-NEEDLE, DISP, 3 ML (BD ECLIPSE SYRINGE) 25G X 5/8" 3 ML MISC by Does not apply route. Use as directed with B12       . traMADol (ULTRAM) 50 MG tablet Take 1 tablet (50 mg total) by mouth every 6 (six) hours as needed.  120 tablet  5   No current facility-administered medications on file prior to visit.   Review of Systems Review of Systems  Constitutional: Negative for diaphoresis and unexpected weight change.  HENT: Negative for drooling and tinnitus.   Eyes: Negative for photophobia and visual disturbance.  Respiratory: Negative for choking and stridor.   Gastrointestinal: Negative for vomiting and blood in stool.  Genitourinary: Negative for hematuria and decreased urine  volume.    Objective:   Physical Exam BP 154/78  Pulse 86  Temp(Src) 98.4 F (36.9 C) (Oral)  SpO2 93% Physical Exam  VS noted,mild ill Constitutional: Pt appears well-developed and well-nourished.  HENT: Head: Normocephalic.  Right Ear: External ear normal.  Left Ear: External ear normal.  Bilat tm's mild erythema.  Sinus nontender.  Pharynx mild erythema Eyes: Conjunctivae and EOM are normal. Pupils are equal, round, and reactive to light.  Neck: Normal range of motion. Neck supple.  Cardiovascular: Normal rate and regular rhythm.   Pulmonary/Chest: Effort normal and breath sounds mild decr, with bilat mild wheeze Abd:  Soft,  NT, non-distended, + BS except for mild low mid abd tender, no guarding, no flank tender Neurological: Pt is alert. No cranial nerve deficit.  Skin: Skin is warm. No erythema.  Psychiatric: Pt behavior is normal. Thought content normal.     Assessment & Plan:

## 2011-05-25 NOTE — Assessment & Plan Note (Signed)
Mild to mod, for predpack  course,  to f/u any worsening symptoms or concerns 

## 2011-05-25 NOTE — Assessment & Plan Note (Signed)
prob cystis acute, for urine studies, and antibx as per bronchitis today

## 2011-05-28 LAB — URINE CULTURE: Colony Count: 75000

## 2011-06-12 ENCOUNTER — Other Ambulatory Visit: Payer: Self-pay

## 2011-06-12 MED ORDER — CLONIDINE HCL 0.3 MG PO TABS: 0.3000 mg | ORAL_TABLET | Freq: Two times a day (BID) | ORAL | Status: DC

## 2011-07-03 ENCOUNTER — Ambulatory Visit: Payer: Medicare Other | Admitting: Internal Medicine

## 2011-07-14 ENCOUNTER — Encounter: Payer: Self-pay | Admitting: Internal Medicine

## 2011-07-28 ENCOUNTER — Other Ambulatory Visit: Payer: Self-pay | Admitting: Internal Medicine

## 2011-08-14 ENCOUNTER — Telehealth: Payer: Self-pay | Admitting: Internal Medicine

## 2011-08-14 NOTE — Telephone Encounter (Signed)
Pt says she has a uti and want med call in--lov: 05/2011----

## 2011-08-14 NOTE — Telephone Encounter (Signed)
Sorry, as per office policy, we normally ned OV to prescribe antibx

## 2011-08-17 ENCOUNTER — Other Ambulatory Visit: Payer: Self-pay | Admitting: Internal Medicine

## 2011-08-18 NOTE — Telephone Encounter (Signed)
Called the patient back informed of MD's response to request.  She stated she will call back if symptoms worsen and schedule OV.

## 2011-08-20 ENCOUNTER — Encounter: Payer: Self-pay | Admitting: Internal Medicine

## 2011-08-20 ENCOUNTER — Other Ambulatory Visit: Payer: Medicare Other

## 2011-08-20 ENCOUNTER — Ambulatory Visit (INDEPENDENT_AMBULATORY_CARE_PROVIDER_SITE_OTHER): Payer: Medicare Other | Admitting: Internal Medicine

## 2011-08-20 VITALS — BP 160/86 | HR 57 | Temp 98.3°F | Ht 66.0 in | Wt 230.0 lb

## 2011-08-20 DIAGNOSIS — I10 Essential (primary) hypertension: Secondary | ICD-10-CM

## 2011-08-20 DIAGNOSIS — R300 Dysuria: Secondary | ICD-10-CM

## 2011-08-20 DIAGNOSIS — R7309 Other abnormal glucose: Secondary | ICD-10-CM

## 2011-08-20 DIAGNOSIS — R7302 Impaired glucose tolerance (oral): Secondary | ICD-10-CM

## 2011-08-20 LAB — POCT URINALYSIS DIPSTICK
Bilirubin, UA: NEGATIVE
Blood, UA: NEGATIVE
Glucose, UA: NEGATIVE
Ketones, UA: NEGATIVE
Spec Grav, UA: 1.01
Urobilinogen, UA: 0.2

## 2011-08-20 MED ORDER — CEPHALEXIN 500 MG PO CAPS: 500.0000 mg | ORAL_CAPSULE | Freq: Four times a day (QID) | ORAL | Status: AC

## 2011-08-20 NOTE — Patient Instructions (Addendum)
Take all new medications as prescribed - the antibiotic Your urine specimen will be sent for the urine culture You will be contacted by phone if any changes need to be made immediately, such as any need to adjust the antibiotic. Continue all other medications as before Please have the pharmacy call with any refills you may need.

## 2011-08-22 ENCOUNTER — Encounter: Payer: Self-pay | Admitting: Internal Medicine

## 2011-08-22 LAB — URINE CULTURE: Colony Count: 2000

## 2011-08-22 NOTE — Progress Notes (Signed)
Subjective:    Patient ID: Deanna Wall, female    DOB: 04/03/1931, 78 y.o.   MRN: [id number]  HPI  Here to f/u; has c/o 2-3 days onset mild to mod dysuria, with freq and urgency but no hematuria, abd pain, flank pain, n/v, chills, dizziness.  Pt denies chest pain, increased sob or doe, wheezing, orthopnea, PND, increased LE swelling, palpitations, dizziness or syncope.  Pt denies new neurological symptoms such as new headache, or facial or extremity weakness or numbness   Pt denies polydipsia, polyuria,.  Pt states overall good compliance with meds.  Pt denies fever, wt loss, night sweats, loss of appetite, or other constitutional symptoms except for the above.   Past Medical History  Diagnosis Date  . Multiple sclerosis 10/28/2006  . OSTEOARTHRITIS, KNEES, BILATERAL, SEVERE 06/14/2009  . HYPERLIPIDEMIA 02/16/2010  . ANEMIA, PERNICIOUS 01/30/2009  . HYPERTENSION 10/28/2006  . VITAMIN B12 DEFICIENCY 06/14/2009  . ECZEMA 10/28/2006  . DEGENERATIVE DISC DISEASE 10/28/2006  . Impaired glucose tolerance 12/31/2010  . Sebaceous cyst     chest   . MS (multiple sclerosis)    Past Surgical History  Procedure Date  . Tubal ligation 1958  . Upper gastrointestinal endoscopy   . Colonoscopy     reports that she has never smoked. She has never used smokeless tobacco. She reports that she does not drink alcohol or use illicit drugs. family history includes Arthritis in her mother; Bone cancer in her mother; and Cancer in her mother. Allergies  Allergen Reactions  . Atorvastatin     REACTION: feels funny in face and head   Current Outpatient Prescriptions on File Prior to Visit  Medication Sig Dispense Refill  . aspirin 81 MG tablet Take 81 mg by mouth daily.        . cloNIDine (CATAPRES) 0.3 MG tablet Take 1 tablet (0.3 mg total) by mouth 2 (two) times daily.  180 tablet  3  . cyanocobalamin (,VITAMIN B-12,) 1000 MCG/ML injection INJECT 1 ML INTO THE MUSCLE EVERY WEEK FOR 2 MONTHS, THEN ONCE MONTHLY   1 mL  11  . furosemide (LASIX) 20 MG tablet TAKE 1 TABLET BY MOUTH EVERY MORNING AS NEEDED FOR SWELLING  30 tablet  11  . lisinopril (PRINIVIL,ZESTRIL) 40 MG tablet TAKE 1 TABLET BY MOUTH EVERY MORNING  90 tablet  3  . Needles & Syringes (B-D FILTER NEEDLE/5 MICRON) MISC by Does not apply route. Use to inject B12       . predniSONE (DELTASONE) 10 MG tablet 3 tabs by mouth per day for 3 days,2tabs per day for 3 days,1tab per day for 3 days  18 tablet  0  . simvastatin (ZOCOR) 40 MG tablet TAKE 1 TABLET (40 MG TOTAL) BY MOUTH DAILY.  90 tablet  3  . SYRINGE-NEEDLE, DISP, 3 ML (BD ECLIPSE SYRINGE) 25G X 5/8" 3 ML MISC by Does not apply route. Use as directed with B12       . traMADol (ULTRAM) 50 MG tablet Take 1 tablet (50 mg total) by mouth every 6 (six) hours as needed.  120 tablet  5   Review of Systems Review of Systems  Constitutional: Negative for diaphoresis and unexpected weight change.  HENT: Negative for drooling and tinnitus.   Eyes: Negative for photophobia and visual disturbance.  Respiratory: Negative for choking and stridor.   Gastrointestinal: Negative for vomiting and blood in stool.  Genitourinary: Negative for hematuria and decreased urine volume.  Musculoskeletal: Negative for gait problem.  Skin: Negative for color change and wound.    Objective:   Physical Exam BP 160/86  Pulse 57  Temp(Src) 98.3 F (36.8 C) (Oral)  Ht 5\' 6"  (1.676 m)  Wt 230 lb (104.327 kg)  BMI 37.12 kg/m2  SpO2 96% Physical Exam  VS noted, mild ill Constitutional: Pt appears well-developed and well-nourished.  HENT: Head: Normocephalic.  Right Ear: External ear normal.  Left Ear: External ear normal.  Bilat tm's mild erythema.  Sinus nontender.  Pharynx mild erythema Eyes: Conjunctivae and EOM are normal. Pupils are equal, round, and reactive to light.  Neck: Normal range of motion. Neck supple.  Cardiovascular: Normal rate and regular rhythm.   Pulmonary/Chest: Effort normal and breath  sounds mild decreased, no wheeze Abd: soft, NT, +BS, no flank tender Neurological: Pt is alert. No confused Skin: Skin is warm. No erythema.  Psychiatric: Pt behavior is normal. Thought content normal.     Assessment & Plan:

## 2011-08-22 NOTE — Assessment & Plan Note (Signed)
Asympt, stable overall by hx and exam, most recent data reviewed with pt, and pt to continue medical treatment as before Lab Results  Component Value Date   HGBA1C 6.4 05/25/2011   Pt to call for onset polys or cbg > 200

## 2011-08-22 NOTE — Assessment & Plan Note (Signed)
C/w prob cystitis, for antibx course, urine studies,  to f/u any worsening symptoms or concerns

## 2011-08-22 NOTE — Assessment & Plan Note (Signed)
Elevated today likely situational,  most recent data reviewed with pt, and pt to continue medical treatment as before BP Readings from Last 3 Encounters:  08/20/11 160/86  05/25/11 154/78  05/04/11 140/86

## 2011-11-19 ENCOUNTER — Other Ambulatory Visit (INDEPENDENT_AMBULATORY_CARE_PROVIDER_SITE_OTHER): Payer: Medicare Other

## 2011-11-19 ENCOUNTER — Telehealth: Payer: Self-pay | Admitting: Internal Medicine

## 2011-11-19 ENCOUNTER — Encounter: Payer: Self-pay | Admitting: Internal Medicine

## 2011-11-19 DIAGNOSIS — Z0000 Encounter for general adult medical examination without abnormal findings: Secondary | ICD-10-CM

## 2011-11-19 DIAGNOSIS — R7302 Impaired glucose tolerance (oral): Secondary | ICD-10-CM

## 2011-11-19 DIAGNOSIS — Z79899 Other long term (current) drug therapy: Secondary | ICD-10-CM

## 2011-11-19 DIAGNOSIS — R7309 Other abnormal glucose: Secondary | ICD-10-CM

## 2011-11-19 LAB — CBC WITH DIFFERENTIAL/PLATELET
Basophils Absolute: 0 10*3/uL (ref 0.0–0.1)
Eosinophils Absolute: 0 10*3/uL (ref 0.0–0.7)
Lymphocytes Relative: 28.9 % (ref 12.0–46.0)
MCHC: 32.3 g/dL (ref 30.0–36.0)
Monocytes Relative: 6.8 % (ref 3.0–12.0)
Neutrophils Relative %: 63 % (ref 43.0–77.0)
RDW: 14.5 % (ref 11.5–14.6)

## 2011-11-19 LAB — URINALYSIS, ROUTINE W REFLEX MICROSCOPIC
Ketones, ur: NEGATIVE
Specific Gravity, Urine: <=1.005 (ref 1.000–1.030)
Urine Glucose: NEGATIVE
pH: 6 (ref 5.0–8.0)

## 2011-11-19 LAB — HEPATIC FUNCTION PANEL
ALT: 14 U/L (ref 0–35)
Albumin: 3.7 g/dL (ref 3.5–5.2)
Alkaline Phosphatase: 65 U/L (ref 39–117)
Bilirubin, Direct: 0.1 mg/dL (ref 0.0–0.3)
Total Protein: 7.5 g/dL (ref 6.0–8.3)

## 2011-11-19 LAB — LIPID PANEL
HDL: 52.6 mg/dL (ref >39.00)
Total CHOL/HDL Ratio: 3
Triglycerides: 97 mg/dL (ref 0.0–149.0)

## 2011-11-19 LAB — BASIC METABOLIC PANEL
CO2: 28 mEq/L (ref 19–32)
Chloride: 105 mEq/L (ref 96–112)
Creatinine, Ser: 0.8 mg/dL (ref 0.4–1.2)
Glucose, Bld: 110 mg/dL — ABNORMAL HIGH (ref 70–99)

## 2011-11-19 LAB — TSH: TSH: 2.79 u[IU]/mL (ref 0.35–5.50)

## 2011-11-19 LAB — HEMOGLOBIN A1C: Hgb A1c MFr Bld: 6.5 % (ref 4.6–6.5)

## 2011-11-19 MED ORDER — CEPHALEXIN 500 MG PO CAPS: 500.0000 mg | ORAL_CAPSULE | Freq: Four times a day (QID) | ORAL | Status: AC

## 2011-11-19 NOTE — Telephone Encounter (Signed)
Done hardcopy to robin  

## 2011-11-19 NOTE — Telephone Encounter (Signed)
Message copied by Corwin Levins on Thu Nov 19, 2011 12:12 PM ------      Message from: Scharlene Gloss B      Created: Thu Nov 19, 2011 11:40 AM       Called the patient and she is having some burning when urinating.  Please send antibiotic to [facility] [address]

## 2011-11-19 NOTE — Telephone Encounter (Signed)
Patient informed. 

## 2011-11-24 ENCOUNTER — Encounter: Payer: Self-pay | Admitting: Internal Medicine

## 2011-11-24 ENCOUNTER — Ambulatory Visit (INDEPENDENT_AMBULATORY_CARE_PROVIDER_SITE_OTHER): Payer: Medicare Other | Admitting: Internal Medicine

## 2011-11-24 VITALS — BP 170/90 | HR 54 | Temp 97.3°F | Ht 66.0 in | Wt 230.0 lb

## 2011-11-24 DIAGNOSIS — N390 Urinary tract infection, site not specified: Secondary | ICD-10-CM

## 2011-11-24 DIAGNOSIS — G35 Multiple sclerosis: Secondary | ICD-10-CM

## 2011-11-24 DIAGNOSIS — I10 Essential (primary) hypertension: Secondary | ICD-10-CM

## 2011-11-24 DIAGNOSIS — R7309 Other abnormal glucose: Secondary | ICD-10-CM

## 2011-11-24 DIAGNOSIS — Z0000 Encounter for general adult medical examination without abnormal findings: Secondary | ICD-10-CM

## 2011-11-24 DIAGNOSIS — R7302 Impaired glucose tolerance (oral): Secondary | ICD-10-CM

## 2011-11-24 MED ORDER — NITROFURANTOIN MACROCRYSTAL 50 MG PO CAPS: 50.0000 mg | ORAL_CAPSULE | Freq: Every day | ORAL | Status: DC

## 2011-11-24 MED ORDER — IRBESARTAN 300 MG PO TABS: 300.0000 mg | ORAL_TABLET | Freq: Every day | ORAL | Status: DC

## 2011-11-24 NOTE — Assessment & Plan Note (Signed)

## 2011-11-24 NOTE — Assessment & Plan Note (Signed)
stable overall by hx and exam, most recent data reviewed with pt, and pt to continue medical treatment as before Lab Results  Component Value Date   HGBA1C 6.5 11/19/2011    

## 2011-11-24 NOTE — Progress Notes (Signed)
Subjective:    Patient ID: Deanna Wall, female    DOB: [birth year], 78 y.o.   MRN: [id number]  HPI  Here for wellness and f/u;  Overall doing ok;  Pt denies CP, worsening SOB, DOE, wheezing, orthopnea, PND, worsening LE edema, palpitations, dizziness or syncope.  Pt denies neurological change such as new Headache, facial or extremity weakness.  Pt denies polydipsia, polyuria, or low sugar symptoms. Pt states overall good compliance with treatment and medications, good tolerability, and trying to follow lower cholesterol diet.  Pt denies worsening depressive symptoms, suicidal ideation or panic. No fever, wt loss, night sweats, loss of appetite, or other constitutional symptoms.  Pt states good ability with ADL's, low fall risk, home safety reviewed and adequate, no significant changes in hearing or vision, and occasionally active with exercise.  Also with some urge incontinence, only symptomatic about twice per wk, does not normally have significant leakage or wetting. Has had 3 episode UTI in the past yr, would like to start preventive therapy.  Overall good compliance with treatment, and good medicine tolerability.  Has not checked BP at home, would like to change the lisinopril if possible b/c does not seem to be working as well recently, despite garlic use use as well, but not check BP recently at home.   Has been seeing Dr Yan/neurology for MS, not sure about need for f/u or her type of MS, per pt told to return for any changes.  Has husband who is very supportive, but despite her LE weakness right > left she is able to stand somewhat, has walker and power wheelchair if needed, able to do all ADL's, has shower chair, toilet riser, ramp outside the home; does cooking and ironing while sitting. Past Medical History  Diagnosis Date  . Multiple sclerosis 10/28/2006  . OSTEOARTHRITIS, KNEES, BILATERAL, SEVERE 06/14/2009  . HYPERLIPIDEMIA 02/16/2010  . ANEMIA, PERNICIOUS 01/30/2009  . HYPERTENSION 10/28/2006    . VITAMIN B12 DEFICIENCY 06/14/2009  . ECZEMA 10/28/2006  . DEGENERATIVE DISC DISEASE 10/28/2006  . Impaired glucose tolerance 12/31/2010  . Sebaceous cyst     chest   . MS (multiple sclerosis)    Past Surgical History  Procedure Date  . Tubal ligation 1958  . Upper gastrointestinal endoscopy   . Colonoscopy     reports that she has never smoked. She has never used smokeless tobacco. She reports that she does not drink alcohol or use illicit drugs. family history includes Arthritis in her mother; Bone cancer in her mother; and Cancer in her mother. Allergies  Allergen Reactions  . Atorvastatin     REACTION: feels funny in face and head   Current Outpatient Prescriptions on File Prior to Visit  Medication Sig Dispense Refill  . aspirin 81 MG tablet Take 81 mg by mouth daily.        . cephALEXin (KEFLEX) 500 MG capsule Take 1 capsule (500 mg total) by mouth 4 (four) times daily.  40 capsule  0  . cloNIDine (CATAPRES) 0.3 MG tablet Take 1 tablet (0.3 mg total) by mouth 2 (two) times daily.  180 tablet  3  . cyanocobalamin (,VITAMIN B-12,) 1000 MCG/ML injection INJECT 1 ML INTO THE MUSCLE EVERY WEEK FOR 2 MONTHS, THEN ONCE MONTHLY  1 mL  11  . furosemide (LASIX) 20 MG tablet TAKE 1 TABLET BY MOUTH EVERY MORNING AS NEEDED FOR SWELLING  30 tablet  11  . Needles & Syringes (B-D FILTER NEEDLE/5 MICRON) MISC by Does not apply  route. Use to inject B12       . predniSONE (DELTASONE) 10 MG tablet 3 tabs by mouth per day for 3 days,2tabs per day for 3 days,1tab per day for 3 days  18 tablet  0  . simvastatin (ZOCOR) 40 MG tablet TAKE 1 TABLET (40 MG TOTAL) BY MOUTH DAILY.  90 tablet  3  . SYRINGE-NEEDLE, DISP, 3 ML (BD ECLIPSE SYRINGE) 25G X 5/8" 3 ML MISC by Does not apply route. Use as directed with B12       . traMADol (ULTRAM) 50 MG tablet Take 1 tablet (50 mg total) by mouth every 6 (six) hours as needed.  120 tablet  5  . irbesartan (AVAPRO) 300 MG tablet Take 1 tablet (300 mg total) by mouth  daily.  90 tablet  3   Review of Systems Review of Systems  Constitutional: Negative for diaphoresis, activity change, appetite change and unexpected weight change.  HENT: Negative for hearing loss, ear pain, facial swelling, mouth sores and neck stiffness.   Eyes: Negative for pain, redness and visual disturbance.  Respiratory: Negative for shortness of breath and wheezing.   Cardiovascular: Negative for chest pain and palpitations.  Gastrointestinal: Negative for diarrhea, blood in stool, abdominal distention and rectal pain.  Genitourinary: Negative for hematuria, flank pain and decreased urine volume.  Musculoskeletal: Negative for myalgias and joint swelling.  Skin: Negative for color change and wound.  Neurological: Negative for syncope and numbness.  Hematological: Negative for adenopathy.  Psychiatric/Behavioral: Negative for hallucinations, self-injury, decreased concentration and agitation.      Objective:   Physical Exam BP 170/90  Pulse 54  Temp 97.3 F (36.3 C) (Oral)  Ht 5\' 6"  (1.676 m)  Wt 230 lb (104.327 kg)  BMI 37.12 kg/m2  SpO2 93% Physical Exam  VS noted, examined in wheelchair Constitutional: Pt is oriented to person, place, and time. Appears well-developed and well-nourished.  HENT:  Head: Normocephalic and atraumatic.  Right Ear: External ear normal.  Left Ear: External ear normal.  Nose: Nose normal.  Mouth/Throat: Oropharynx is clear and moist.  Eyes: Conjunctivae and EOM are normal. Pupils are equal, round, and reactive to light.  Neck: Normal range of motion. Neck supple. No JVD present. No tracheal deviation present.  Cardiovascular: Normal rate, regular rhythm, normal heart sounds and intact distal pulses.   Pulmonary/Chest: Effort normal and breath sounds normal.  Abdominal: Soft. Bowel sounds are normal. There is no tenderness.  Musculoskeletal: Normal range of motion. Exhibits no edema.  Lymphadenopathy:  Has no cervical adenopathy.    Neurological: Pt is alert and oriented to person, place, and time. Pt has normal reflexes. No cranial nerve deficit. O/w not done in detail Skin: Skin is warm and dry. No rash noted.  Psychiatric:  Has  normal mood and affect. Behavior is normal.     Assessment & Plan:

## 2011-11-24 NOTE — Assessment & Plan Note (Signed)
Details unclear, will ask for office notes/labs per Dr Bradd Burner

## 2011-11-24 NOTE — Assessment & Plan Note (Signed)
Elevated today but overall control unclear, will change the lisinopril to avapro 300, but needs to monitor BP closely at home, if > 140/90 would consider adding amlodipine

## 2011-11-24 NOTE — Patient Instructions (Addendum)
OK to stop the lisinopril as you asked Please start the Generic for Avapro 300 mg per day - for blood pressure Please check your Blood Pressure on a regular basis;  You goal is to be less than 140/90 If your blood pressure remains increased > 140/90, please call as we can add amlodipine (a different medication) Please finish your current cephalexin for bladder infection After this is done, please start the daily low dose antibiotic called nitrofurantoin at 50 mg, which is commonly used to help prevent other urinary tract infections Continue all other medications as before Please have the pharmacy call with any refills you may need. We will call to try to get records about your MS treatment per Dr Terrace Arabia Please return in 6 mo with Lab testing done 3-5 days before

## 2011-11-24 NOTE — Assessment & Plan Note (Signed)
To finish current cephalexin,symptoms improved and exam benign, then start prophylactic macrobid daily at 50 qd,  to f/u any worsening symptoms or concerns, declines urology eval

## 2011-12-05 ENCOUNTER — Encounter (HOSPITAL_COMMUNITY): Payer: Self-pay | Admitting: *Deleted

## 2011-12-05 ENCOUNTER — Emergency Department (HOSPITAL_COMMUNITY)
Admission: EM | Admit: 2011-12-05 | Discharge: 2011-12-05 | Disposition: A | Discharge status: Home / Self Care | Payer: Medicare Other | Attending: Emergency Medicine | Admitting: Emergency Medicine

## 2011-12-05 DIAGNOSIS — D649 Anemia, unspecified: Secondary | ICD-10-CM | POA: Insufficient documentation

## 2011-12-05 DIAGNOSIS — G35 Multiple sclerosis: Secondary | ICD-10-CM | POA: Insufficient documentation

## 2011-12-05 DIAGNOSIS — M1710 Unilateral primary osteoarthritis, unspecified knee: Secondary | ICD-10-CM | POA: Insufficient documentation

## 2011-12-05 DIAGNOSIS — I10 Essential (primary) hypertension: Secondary | ICD-10-CM

## 2011-12-05 DIAGNOSIS — E538 Deficiency of other specified B group vitamins: Secondary | ICD-10-CM | POA: Insufficient documentation

## 2011-12-05 DIAGNOSIS — E785 Hyperlipidemia, unspecified: Secondary | ICD-10-CM | POA: Insufficient documentation

## 2011-12-05 LAB — POCT I-STAT, CHEM 8
Creatinine, Ser: 0.9 mg/dL (ref 0.50–1.10)
Hemoglobin: 14.6 g/dL (ref 12.0–15.0)
Sodium: 144 mEq/L (ref 135–145)
TCO2: 27 mmol/L (ref 0–100)

## 2011-12-05 MED ORDER — CLONIDINE HCL 0.3 MG PO TABS: 0.4000 mg | ORAL_TABLET | Freq: Two times a day (BID) | ORAL | Status: DC

## 2011-12-05 NOTE — ED Notes (Signed)
Pt reports feeling dizzy every am x1 week, pt reports elevated BP today. Pt denies chest/back/abd pain, N/V/D, SOB, or diaphoresis. Pt reports her PCP changed her BP medication 11/24/2011, pt reports symptoms started shortly after this. Pt also reports taking a abx for UTI. Pt unsure of the name of the abx

## 2011-12-05 NOTE — ED Notes (Signed)
Family at bedside. 

## 2011-12-05 NOTE — ED Provider Notes (Signed)
Complained of dizziness i.e. slight lightheadedness this morning lasting 2 hours no headache no chest pain no other complaint patient took her blood pressure was noted to be high she presently is asymptomatic her exam alert appropriate Glasgow Coma Score 15 moves all extremities lungs clear auscultation heart regular in rhythm. No evidence of hypertensive emergency  Doug Sou, MD 12/05/11 [id number]

## 2011-12-05 NOTE — ED Provider Notes (Signed)
History     CSN: [id number]  Arrival date & time 12/05/11  1508   First MD Initiated Contact with Patient 12/05/11 1546      Chief Complaint  Patient presents with  . Hypertension   Patient is a 78 year old female with past medical history as listed below who presents to emergency department with complaints of high blood pressure. Patient states that this morning she checked her blood pressure at home, it was noted to be 220s over 110.  She went to her daughter's house and again her blood pressure was elevated upon recheck.  When asked about symptoms patient states that earlier in the morning for approximately one hour she had mild lightheadedness and some blurry vision. However upon arrival in the emergency department patient had no symptoms.  Patient notes that approximately 2 weeks ago her home blood pressure medication regimen was changed.  She is unaware of the specific changes that were completed.  (Consider location/radiation/quality/duration/timing/severity/associated sxs/prior treatment) Patient is a 78 y.o. female presenting with hypertension.  Hypertension This is a chronic problem. The current episode started today. The problem occurs constantly. The problem has been unchanged. Associated symptoms include a visual change (intermittent blurry vision this morning but none currently). Pertinent negatives include no abdominal pain, chest pain, diaphoresis, headaches, nausea, numbness or weakness. Nothing aggravates the symptoms. Treatments tried: home medications. The treatment provided no relief.    Past Medical History  Diagnosis Date  . Multiple sclerosis 10/28/2006  . OSTEOARTHRITIS, KNEES, BILATERAL, SEVERE 06/14/2009  . HYPERLIPIDEMIA 02/16/2010  . ANEMIA, PERNICIOUS 01/30/2009  . HYPERTENSION 10/28/2006  . VITAMIN B12 DEFICIENCY 06/14/2009  . ECZEMA 10/28/2006  . DEGENERATIVE DISC DISEASE 10/28/2006  . Impaired glucose tolerance 12/31/2010  . Sebaceous cyst     chest   . MS  (multiple sclerosis)     Past Surgical History  Procedure Date  . Tubal ligation 1958  . Upper gastrointestinal endoscopy   . Colonoscopy     Family History  Problem Relation Age of Onset  . Bone cancer Mother   . Arthritis Mother   . Cancer Mother     bone    History  Substance Use Topics  . Smoking status: Never Smoker   . Smokeless tobacco: Never Used  . Alcohol Use: No    OB History    Grav Para Term Preterm Abortions TAB SAB Ect Mult Living                  Review of Systems  Constitutional: Negative for diaphoresis.  Cardiovascular: Negative for chest pain.  Gastrointestinal: Negative for nausea and abdominal pain.  Neurological: Negative for weakness, numbness and headaches.  All other systems reviewed and are negative.    Allergies  Atorvastatin  Home Medications   Current Outpatient Rx  Name Route Sig Dispense Refill  . ASPIRIN 81 MG PO TABS Oral Take 81 mg by mouth daily.      Marland Kitchen CLONIDINE HCL 0.3 MG PO TABS Oral Take 0.3 mg by mouth 2 (two) times daily.    Marland Kitchen VITAMIN B-12 IJ Injection Inject 1,000 mcg/mL as directed every 30 (thirty) days.    . FUROSEMIDE 20 MG PO TABS Oral Take 20 mg by mouth daily as needed. For swelling    . IRBESARTAN 300 MG PO TABS Oral Take 300 mg by mouth daily.    Marland Kitchen SIMVASTATIN 40 MG PO TABS Oral Take 40 mg by mouth every evening.      BP 218/92  Pulse  73  Temp 97.6 F (36.4 C) (Oral)  Resp 20  SpO2 95%  Physical Exam  Nursing note and vitals reviewed. Constitutional: She is oriented to person, place, and time. She appears well-developed and well-nourished. No distress.  HENT:  Head: Normocephalic and atraumatic.  Right Ear: External ear normal.  Left Ear: External ear normal.  Nose: Nose normal.  Mouth/Throat: Oropharynx is clear and moist.  Eyes: Conjunctivae normal and EOM are normal. Pupils are equal, round, and reactive to light.  Neck: Normal range of motion. Neck supple.  Cardiovascular: Normal rate,  regular rhythm, normal heart sounds and intact distal pulses.   Pulmonary/Chest: Effort normal and breath sounds normal. No respiratory distress. She has no wheezes. She has no rales. She exhibits no tenderness.  Abdominal: Soft. Bowel sounds are normal.  Musculoskeletal: Normal range of motion. She exhibits no edema and no tenderness.  Neurological: She is alert and oriented to person, place, and time. No cranial nerve deficit or sensory deficit. Gait (patient unable to ambulate at baseline due to weakness from reported MS) abnormal.  Skin: Skin is warm and dry.  Psychiatric: She has a normal mood and affect.    ED Course  Procedures (including critical care time)  Labs Reviewed  POCT I-STAT, CHEM 8 - Abnormal; Notable for the following:    Glucose, Bld 115 (*)     All other components within normal limits   No results found.    Date: 12/05/2011  Rate: 58  Rhythm: normal sinus rhythm  QRS Axis: normal  Intervals: PR prolonged  ST/T Wave abnormalities: normal  Conduction Disutrbances:first-degree A-V block   Narrative Interpretation:   Old EKG Reviewed: none available    1. Hypertension       MDM    Patient is a 78 year old female who presents with complaints of hypertension. Afebrile, vital signs remarkable for an initial blood pressure of 218/92. On exam patient overall appeared well, no peripheral edema, lungs clear to auscultation bilaterally, and otherwise unremarkable. Given recent blood pressure medication change and intermittent lightheadedness it was felt that EKG and BMP to evaluate renal function was needed. EKG and renal function within normal limits. Patient felt to be stable for discharge. Increased patient's clonidine to 0.4 BID as ARB was at max dose. Patient to call PCP for follow up as soon as possible.  Family and patient were in agreement and voiced understanding on need for follow up.          Johnney Ou, MD 12/06/11 [phone]

## 2011-12-05 NOTE — ED Notes (Addendum)
Patient states that she was not feeling well today, dizziness and headache so she took her blood pressure and systolic was 226.  Patient denies any chest pain, sob, or other symptoms.  Alert and oriented x 3.  Patient states that her MD change her BP medication on the 3rd of September.

## 2011-12-06 NOTE — ED Provider Notes (Signed)
I have personally seen and examined the patient.  I have discussed the plan of care with the resident.  I have reviewed the documentation on PMH/FH/Soc. History.  I have reviewed the documentation of the resident and agree.  Osama Coleson, MD 12/06/11 1455 

## 2011-12-07 ENCOUNTER — Encounter: Payer: Self-pay | Admitting: Internal Medicine

## 2011-12-07 ENCOUNTER — Ambulatory Visit (INDEPENDENT_AMBULATORY_CARE_PROVIDER_SITE_OTHER): Payer: Medicare Other | Admitting: Internal Medicine

## 2011-12-07 VITALS — BP 134/80 | HR 55 | Temp 98.3°F

## 2011-12-07 DIAGNOSIS — Z23 Encounter for immunization: Secondary | ICD-10-CM

## 2011-12-07 DIAGNOSIS — R519 Headache, unspecified: Secondary | ICD-10-CM | POA: Insufficient documentation

## 2011-12-07 DIAGNOSIS — R7302 Impaired glucose tolerance (oral): Secondary | ICD-10-CM

## 2011-12-07 DIAGNOSIS — R7309 Other abnormal glucose: Secondary | ICD-10-CM

## 2011-12-07 DIAGNOSIS — I10 Essential (primary) hypertension: Secondary | ICD-10-CM

## 2011-12-07 DIAGNOSIS — N390 Urinary tract infection, site not specified: Secondary | ICD-10-CM

## 2011-12-07 DIAGNOSIS — R51 Headache: Secondary | ICD-10-CM

## 2011-12-07 MED ORDER — AMLODIPINE BESYLATE 5 MG PO TABS: 5.0000 mg | ORAL_TABLET | Freq: Every day | ORAL | Status: DC

## 2011-12-07 MED ORDER — CLONIDINE HCL 0.3 MG PO TABS: ORAL_TABLET | ORAL | Status: DC

## 2011-12-07 NOTE — Assessment & Plan Note (Signed)
Mild, exam benign, some suggestive of trigeminal neuralgia, to see dental to r/o cause, consider tx if worsens or persists

## 2011-12-07 NOTE — Assessment & Plan Note (Addendum)
stable overall by hx and exam but intolerant of current clonidine .4 bid, most recent data reviewed with pt, and pt to decrease the clonidine .3 bid, and add amlodipine 5 qd, f/u at home and next visit BP Readings from Last 3 Encounters:  12/07/11 134/80  12/05/11 177/91  11/24/11 170/90

## 2011-12-07 NOTE — Progress Notes (Signed)
Subjective:    Patient ID: Deanna Wall, female    DOB: February 23, 1932, 78 y.o.   MRN: [id number]  HPI  Here to f/u;  Seen with severe elev BP in ER, thought to be taking avapro300 and clonidine .3 bid at the time, eval neg so clonidine increased to .4 bid,  Unfortunately pt now states she really wasn't taking the avapro 300 b/c felt tired taking it, and feels even worse today after several days of the clonidine .4 bid; Pt denies chest pain, increased sob or doe, wheezing, orthopnea, PND, increased LE swelling, palpitations, dizziness or syncope.   Pt denies polydipsia, polyuria. Pt denies new neurological symptoms such as new headache, or facial or extremity weakness or numbness   Pt denies fever, wt loss, night sweats, loss of appetite, or other constitutional symptoms   Denies urinary symptoms such as dysuria, frequency, urgency,or hematuria, tolerated recent tx for UTI ok.   Past Medical History  Diagnosis Date  . Multiple sclerosis 10/28/2006  . OSTEOARTHRITIS, KNEES, BILATERAL, SEVERE 06/14/2009  . HYPERLIPIDEMIA 02/16/2010  . ANEMIA, PERNICIOUS 01/30/2009  . HYPERTENSION 10/28/2006  . VITAMIN B12 DEFICIENCY 06/14/2009  . ECZEMA 10/28/2006  . DEGENERATIVE DISC DISEASE 10/28/2006  . Impaired glucose tolerance 12/31/2010  . Sebaceous cyst     chest   . MS (multiple sclerosis)    Past Surgical History  Procedure Date  . Tubal ligation 1958  . Upper gastrointestinal endoscopy   . Colonoscopy     reports that she has never smoked. She has never used smokeless tobacco. She reports that she does not drink alcohol or use illicit drugs. family history includes Arthritis in her mother; Bone cancer in her mother; and Cancer in her mother. Allergies  Allergen Reactions  . Atorvastatin     REACTION: feels funny in face and head   Current Outpatient Prescriptions on File Prior to Visit  Medication Sig Dispense Refill  . aspirin 81 MG tablet Take 81 mg by mouth daily.        . Cyanocobalamin (VITAMIN  B-12 IJ) Inject 1,000 mcg/mL as directed every 30 (thirty) days.      . furosemide (LASIX) 20 MG tablet Take 20 mg by mouth daily as needed. For swelling      . simvastatin (ZOCOR) 40 MG tablet Take 40 mg by mouth every evening.      Marland Kitchen DISCONTD: cloNIDine (CATAPRES) 0.3 MG tablet Take 1.5 tablets (0.45 mg total) by mouth 2 (two) times daily.  60 tablet  0  . amLODipine (NORVASC) 5 MG tablet Take 1 tablet (5 mg total) by mouth daily.  30 tablet  11   Review of Systems  Constitutional: Negative for diaphoresis and unexpected weight change.  HENT: Negative for tinnitus.   Eyes: Negative for photophobia and visual disturbance.  Gastrointestinal: Negative for vomiting and blood in stool.  Genitourinary: Negative for hematuria and decreased urine volume.  Musculoskeletal: Negative for acute joint swelling Skin: Negative for color change and wound.  Neurological: Negative for tremors and numbness. except for recent onset mild pain and numbness left face without weakness or swelling, plans to see dentist soon Psychiatric/Behavioral: Negative for decreased concentration. The patient is not hyperactive.      Objective:   Physical Exam BP 134/80  Pulse 55  Temp 98.3 F (36.8 C) (Oral)  SpO2 97% Physical Exam  VS noted Constitutional: Pt appears well-developed and well-nourished.  HENT: Head: Normocephalic.  Right Ear: External ear normal.  Left Ear: External ear normal.  Eyes: Conjunctivae and EOM are normal. Pupils are equal, round, and reactive to light.  Neck: Normal range of motion. Neck supple.  Cardiovascular: Normal rate and regular rhythm.   Pulmonary/Chest: Effort normal and breath sounds normal.  Abd:  Soft, NT, non-distended, + BS, no flank tender Neurological: Pt is alert. Not confused , cn 2-12 intact Skin: Skin is warm. No erythema. No rash Psychiatric: Pt behavior is normal. Thought content normal. 1+ nervous    Assessment & Plan:

## 2011-12-07 NOTE — Patient Instructions (Addendum)
Ok to take the clonidine at the 0.3 mg twice per day Take all new medications as prescribed - the amlodipine 5 mg per day OK to not take the daily antibiotic as you mentioned to prevent the bladder infection for now OK to not take the avapro 300 mg per day Continue all other medications as before Please have the pharmacy call with any refills you may need. Please return if the left facial pain and numbness does not improve, even after a dental visit Please return in 3 weeks

## 2011-12-07 NOTE — Assessment & Plan Note (Signed)
Declines for now to take preventive tx, "I take too many pills",  to f/u any worsening symptoms or concerns

## 2011-12-07 NOTE — Assessment & Plan Note (Signed)
stable overall by hx and exam, most recent data reviewed with pt, and pt to continue medical treatment as before Lab Results  Component Value Date   HGBA1C 6.5 11/19/2011    

## 2011-12-24 ENCOUNTER — Ambulatory Visit (INDEPENDENT_AMBULATORY_CARE_PROVIDER_SITE_OTHER): Payer: Medicare Other | Admitting: Internal Medicine

## 2011-12-24 ENCOUNTER — Encounter: Payer: Self-pay | Admitting: Internal Medicine

## 2011-12-24 VITALS — BP 130/62 | HR 66 | Temp 98.0°F | Resp 16

## 2011-12-24 DIAGNOSIS — N390 Urinary tract infection, site not specified: Secondary | ICD-10-CM

## 2011-12-24 MED ORDER — CIPROFLOXACIN HCL 500 MG PO TABS: 500.0000 mg | ORAL_TABLET | Freq: Two times a day (BID) | ORAL | Status: AC

## 2011-12-24 NOTE — Assessment & Plan Note (Signed)
Start cipro and check her UA and urine culture

## 2011-12-24 NOTE — Progress Notes (Signed)
  Subjective:    Patient ID: Deanna Wall, female    DOB: 1931/11/15, 78 y.o.   MRN: [id number]  Urinary Tract Infection  This is a recurrent problem. The current episode started in the past 7 days. The problem has been gradually worsening. The quality of the pain is described as burning. The pain is at a severity of 1/10. The patient is experiencing no pain. There has been no fever. The fever has been present for less than 1 day. She is not sexually active. There is a history of pyelonephritis. Associated symptoms include frequency, hematuria and urgency. Pertinent negatives include no chills, discharge, flank pain, hesitancy, nausea, possible pregnancy, sweats or vomiting. She has tried nothing for the symptoms. Her past medical history is significant for recurrent UTIs.      Review of Systems  Constitutional: Negative.  Negative for chills.  HENT: Negative.   Eyes: Negative.   Cardiovascular: Negative.   Gastrointestinal: Negative.  Negative for nausea and vomiting.  Genitourinary: Positive for dysuria, urgency, frequency and hematuria. Negative for hesitancy, flank pain, decreased urine volume, enuresis, difficulty urinating, menstrual problem, pelvic pain and dyspareunia.  Musculoskeletal: Negative.   Skin: Negative.   Neurological: Negative.   Hematological: Negative for adenopathy. Does not bruise/bleed easily.  Psychiatric/Behavioral: Negative.        Objective:   Physical Exam  Vitals reviewed. Constitutional: She is oriented to person, place, and time. Vital signs are normal. She appears well-developed and well-nourished.  Non-toxic appearance. She has a sickly appearance (wheelchair bound). She appears ill. No distress.  HENT:  Head: Normocephalic and atraumatic.  Mouth/Throat: Oropharynx is clear and moist. No oropharyngeal exudate.  Eyes: Conjunctivae normal are normal. Right eye exhibits no discharge. Left eye exhibits no discharge. No scleral icterus.  Neck: Normal  range of motion. Neck supple. No JVD present. No tracheal deviation present. No thyromegaly present.  Cardiovascular: Normal rate, regular rhythm, normal heart sounds and intact distal pulses.  Exam reveals no gallop and no friction rub.   No murmur heard. Pulmonary/Chest: Effort normal and breath sounds normal. No stridor. No respiratory distress. She has no wheezes. She has no rales. She exhibits no tenderness.  Abdominal: Soft. Normal appearance and bowel sounds are normal. She exhibits no distension and no mass. There is no hepatosplenomegaly. There is no tenderness. There is no rebound, no guarding and no CVA tenderness.  Lymphadenopathy:    She has no cervical adenopathy.  Neurological: She is oriented to person, place, and time.  Skin: Skin is warm and dry. No rash noted. She is not diaphoretic. No erythema. No pallor.  Psychiatric: She has a normal mood and affect. Her behavior is normal. Judgment and thought content normal.          Assessment & Plan:

## 2011-12-24 NOTE — Patient Instructions (Signed)
Urinary Tract Infection Urinary tract infections (UTIs) can develop anywhere along your urinary tract. Your urinary tract is your body's drainage system for removing wastes and extra water. Your urinary tract includes two kidneys, two ureters, a bladder, and a urethra. Your kidneys are a pair of bean-shaped organs. Each kidney is about the size of your fist. They are located below your ribs, one on each side of your spine. CAUSES Infections are caused by microbes, which are microscopic organisms, including fungi, viruses, and bacteria. These organisms are so small that they can only be seen through a microscope. Bacteria are the microbes that most commonly cause UTIs. SYMPTOMS  Symptoms of UTIs may vary by age and gender of the patient and by the location of the infection. Symptoms in young women typically include a frequent and intense urge to urinate and a painful, burning feeling in the bladder or urethra during urination. Older women and men are more likely to be tired, shaky, and weak and have muscle aches and abdominal pain. A fever may mean the infection is in your kidneys. Other symptoms of a kidney infection include pain in your back or sides below the ribs, nausea, and vomiting. DIAGNOSIS To diagnose a UTI, your caregiver will ask you about your symptoms. Your caregiver also will ask to provide a urine sample. The urine sample will be tested for bacteria and white blood cells. White blood cells are made by your body to help fight infection. TREATMENT  Typically, UTIs can be treated with medication. Because most UTIs are caused by a bacterial infection, they usually can be treated with the use of antibiotics. The choice of antibiotic and length of treatment depend on your symptoms and the type of bacteria causing your infection. HOME CARE INSTRUCTIONS  If you were prescribed antibiotics, take them exactly as your caregiver instructs you. Finish the medication even if you feel better after you  have only taken some of the medication.  Drink enough water and fluids to keep your urine clear or pale yellow.  Avoid caffeine, tea, and carbonated beverages. They tend to irritate your bladder.  Empty your bladder often. Avoid holding urine for long periods of time.  Empty your bladder before and after sexual intercourse.  After a bowel movement, women should cleanse from front to back. Use each tissue only once. SEEK MEDICAL CARE IF:   You have back pain.  You develop a fever.  Your symptoms do not begin to resolve within 3 days. SEEK IMMEDIATE MEDICAL CARE IF:   You have severe back pain or lower abdominal pain.  You develop chills.  You have nausea or vomiting.  You have continued burning or discomfort with urination. MAKE SURE YOU:   Understand these instructions.  Will watch your condition.  Will get help right away if you are not doing well or get worse. Document Released: 12/17/2004 Document Revised: 09/08/2011 Document Reviewed: 04/17/2011 [employer] Patient Information 2013 [employer]  

## 2011-12-29 ENCOUNTER — Ambulatory Visit (INDEPENDENT_AMBULATORY_CARE_PROVIDER_SITE_OTHER): Payer: Medicare Other | Admitting: Internal Medicine

## 2011-12-29 ENCOUNTER — Other Ambulatory Visit: Payer: Medicare Other

## 2011-12-29 ENCOUNTER — Encounter: Payer: Self-pay | Admitting: Internal Medicine

## 2011-12-29 VITALS — BP 120/80 | HR 62 | Temp 96.0°F

## 2011-12-29 DIAGNOSIS — E785 Hyperlipidemia, unspecified: Secondary | ICD-10-CM

## 2011-12-29 DIAGNOSIS — N390 Urinary tract infection, site not specified: Secondary | ICD-10-CM

## 2011-12-29 DIAGNOSIS — R7309 Other abnormal glucose: Secondary | ICD-10-CM

## 2011-12-29 DIAGNOSIS — R7302 Impaired glucose tolerance (oral): Secondary | ICD-10-CM

## 2011-12-29 DIAGNOSIS — I10 Essential (primary) hypertension: Secondary | ICD-10-CM

## 2011-12-29 MED ORDER — FUROSEMIDE 20 MG PO TABS: 20.0000 mg | ORAL_TABLET | Freq: Every day | ORAL | Status: DC | PRN

## 2011-12-29 MED ORDER — FLUCONAZOLE 150 MG PO TABS: ORAL_TABLET | ORAL | Status: DC

## 2011-12-29 NOTE — Progress Notes (Signed)
Subjective:    Patient ID: Deanna Wall, female    DOB: Jan 19, 1932, 78 y.o.   MRN: [id number]  HPI  Here to f/u after seeing Dr Yetta Barre oct 3 with recurrent uti, tx with 7 days cipro, still has 2 days left, here today as still some mild burning and itching to vaginal introitus area without d/c, but no dysuria, urgency but still has some freq but possibly due to increased po intake as rec'd by Dr Yetta Barre;  No fever, abd pain and does not feel ill. Pt denies chest pain, increased sob or doe, wheezing, orthopnea, PND, increased LE swelling, palpitations, dizziness or syncope.  Pt denies new neurological symptoms such as new headache, or facial or extremity weakness or numbness and has regular f/u with Dr Terrace Arabia for MS.  Has not taken the nitrofurantoin yet, not clear why, states she meant to start eventually. Past Medical History  Diagnosis Date  . Multiple sclerosis 10/28/2006  . OSTEOARTHRITIS, KNEES, BILATERAL, SEVERE 06/14/2009  . HYPERLIPIDEMIA 02/16/2010  . ANEMIA, PERNICIOUS 01/30/2009  . HYPERTENSION 10/28/2006  . VITAMIN B12 DEFICIENCY 06/14/2009  . ECZEMA 10/28/2006  . DEGENERATIVE DISC DISEASE 10/28/2006  . Impaired glucose tolerance 12/31/2010  . Sebaceous cyst     chest   . MS (multiple sclerosis)    Past Surgical History  Procedure Date  . Tubal ligation 1958  . Upper gastrointestinal endoscopy   . Colonoscopy     reports that she has never smoked. She has never used smokeless tobacco. She reports that she does not drink alcohol or use illicit drugs. family history includes Arthritis in her mother; Bone cancer in her mother; and Cancer in her mother. Allergies  Allergen Reactions  . Atorvastatin     REACTION: feels funny in face and head   Current Outpatient Prescriptions on File Prior to Visit  Medication Sig Dispense Refill  . amLODipine (NORVASC) 5 MG tablet Take 1 tablet (5 mg total) by mouth daily.  30 tablet  11  . aspirin 81 MG tablet Take 81 mg by mouth daily.        .  ciprofloxacin (CIPRO) 500 MG tablet Take 1 tablet (500 mg total) by mouth 2 (two) times daily.  14 tablet  0  . cloNIDine (CATAPRES) 0.3 MG tablet 1 tab by mouth twice per day  60 tablet  11  . Cyanocobalamin (VITAMIN B-12 IJ) Inject 1,000 mcg/mL as directed every 30 (thirty) days.      . simvastatin (ZOCOR) 40 MG tablet Take 40 mg by mouth every evening.       Review of Systems  Constitutional: Negative for diaphoresis and unexpected weight change.  HENT: Negative for tinnitus.   Eyes: Negative for photophobia and visual disturbance.  Respiratory: Negative for choking and stridor.   Gastrointestinal: Negative for vomiting and blood in stool.  Genitourinary: Negative for hematuria and decreased urine volume.  Musculoskeletal: Negative for acute joint swelling  Skin: Negative for color change and wound.  Neurological: Negative for tremors and numbness.  Psychiatric/Behavioral: Negative for decreased concentration. The patient is not hyperactive.      Objective:   Physical Exam BP 120/80  Pulse 62  Temp 96 F (35.6 C) (Oral)  SpO2 96% Physical Exam  VS noted Constitutional: Pt appears well-developed and well-nourished.  HENT: Head: Normocephalic.  Right Ear: External ear normal.  Left Ear: External ear normal.  Eyes: Conjunctivae and EOM are normal. Pupils are equal, round, and reactive to light.  Neck: Normal range of  motion. Neck supple.  Cardiovascular: Normal rate and regular rhythm.   Pulmonary/Chest: Effort normal and breath sounds normal.  Abd:  Soft, NT, non-distended, + BS, no flank tender Neurological: Pt is alert. Not confused , o/w in wheelchair as per usual, not done in detail Skin: Skin is warm. No erythema.  Psychiatric: Pt behavior is normal. Thought content normal.     Assessment & Plan:

## 2011-12-29 NOTE — Assessment & Plan Note (Signed)
Pt brings specimen from home, still has some vague symptoms I suspect are more related to vaginitis than cystitis;  For today to continue the cipro, check urine cx out of abundance of caution to r/o resistant bacterial infection not covered by cipro, to start the nitrofurantoin daily after cipro done, and diflucan 150 mg x 2 doses 3 days apart;  to f/u any worsening symptoms or concerns

## 2011-12-29 NOTE — Assessment & Plan Note (Signed)
stable overall by hx and exam, most recent data reviewed with pt, and pt to continue medical treatment as before BP Readings from Last 3 Encounters:  12/29/11 120/80  12/24/11 130/62  12/07/11 134/80

## 2011-12-29 NOTE — Assessment & Plan Note (Signed)
stable overall by hx and exam, most recent data reviewed with pt, and pt to continue medical treatment as before Lab Results  Component Value Date   HGBA1C 6.5 11/19/2011

## 2011-12-29 NOTE — Assessment & Plan Note (Signed)
stable overall by hx and exam, most recent data reviewed with pt, and pt to continue medical treatment as before Lab Results  Component Value Date   LDLCALC 69 11/19/2011

## 2011-12-29 NOTE — Patient Instructions (Addendum)
Your urine will be sent for the urine culture Please finish the cipro antibiotic from Dr Yetta Barre Then start the Nitrofurantoin antibiotic (one per day) after that [facility] to take the Diflucan at 150 mg for 2 times only  -  1 pill 3 days apart - as you likely have some yeast infection related to the antibiotics Your refill of the furosemide was sent to the [facility] Continue all other medications as before Please have the pharmacy call with any refills you may need.

## 2011-12-31 LAB — URINE CULTURE

## 2012-01-13 ENCOUNTER — Encounter: Payer: Self-pay | Admitting: Internal Medicine

## 2012-05-20 ENCOUNTER — Other Ambulatory Visit (INDEPENDENT_AMBULATORY_CARE_PROVIDER_SITE_OTHER): Payer: Medicare Other

## 2012-05-20 DIAGNOSIS — R7302 Impaired glucose tolerance (oral): Secondary | ICD-10-CM

## 2012-05-20 DIAGNOSIS — R7309 Other abnormal glucose: Secondary | ICD-10-CM

## 2012-05-20 DIAGNOSIS — I10 Essential (primary) hypertension: Secondary | ICD-10-CM

## 2012-05-20 LAB — LIPID PANEL
Cholesterol: 146 mg/dL (ref 0–200)
LDL Cholesterol: 82 mg/dL (ref 0–99)
VLDL: 16.6 mg/dL (ref 0.0–40.0)

## 2012-05-20 LAB — BASIC METABOLIC PANEL
BUN: 11 mg/dL (ref 6–23)
Creatinine, Ser: 0.8 mg/dL (ref 0.4–1.2)
GFR: 86.6 mL/min (ref >60.00)
Potassium: 4 mEq/L (ref 3.5–5.1)

## 2012-05-26 ENCOUNTER — Ambulatory Visit (INDEPENDENT_AMBULATORY_CARE_PROVIDER_SITE_OTHER): Payer: Medicare Other | Admitting: Internal Medicine

## 2012-05-26 ENCOUNTER — Encounter: Payer: Self-pay | Admitting: Internal Medicine

## 2012-05-26 VITALS — BP 118/62 | HR 53 | Temp 97.7°F | Ht 67.0 in | Wt 240.0 lb

## 2012-05-26 DIAGNOSIS — R7302 Impaired glucose tolerance (oral): Secondary | ICD-10-CM

## 2012-05-26 DIAGNOSIS — E785 Hyperlipidemia, unspecified: Secondary | ICD-10-CM

## 2012-05-26 DIAGNOSIS — Z0000 Encounter for general adult medical examination without abnormal findings: Secondary | ICD-10-CM

## 2012-05-26 DIAGNOSIS — I10 Essential (primary) hypertension: Secondary | ICD-10-CM

## 2012-05-26 DIAGNOSIS — R7309 Other abnormal glucose: Secondary | ICD-10-CM

## 2012-05-26 MED ORDER — AMLODIPINE BESYLATE 5 MG PO TABS: 5.0000 mg | ORAL_TABLET | Freq: Every day | ORAL | Status: DC

## 2012-05-26 MED ORDER — INSULIN PEN NEEDLE 32G X 4 MM MISC: 1.0000 "application " | Status: DC

## 2012-05-26 MED ORDER — TRAMADOL HCL 50 MG PO TABS: 50.0000 mg | ORAL_TABLET | Freq: Four times a day (QID) | ORAL | Status: DC | PRN

## 2012-05-26 MED ORDER — CYANOCOBALAMIN 1000 MCG/ML IJ SOLN: 1000.0000 ug | INTRAMUSCULAR | Status: DC

## 2012-05-26 MED ORDER — SYRINGE (DISPOSABLE) 3 ML MISC: Status: DC

## 2012-05-26 MED ORDER — "NEEDLE (DISP) 25G X 1"" MISC": Status: DC

## 2012-05-26 NOTE — Assessment & Plan Note (Signed)
stable overall by history and exam, recent data reviewed with pt, and pt to continue medical treatment as before,  to f/u any worsening symptoms or concerns Lab Results  Component Value Date   LDLCALC 82 05/20/2012

## 2012-05-26 NOTE — Patient Instructions (Signed)
Please continue all other medications as before, and refills have been done if requested. Please have the pharmacy call with any other refills you may need. Please continue your efforts at being more active, low cholesterol diet, and weight control, as you have been doing Please remember to sign up for My Chart if you have not done so, as this will be important to you in the future with finding out test results, communicating by private email, and scheduling acute appointments online when needed. Please return in 6 months, or sooner if needed, with Lab testing done 3-5 days before

## 2012-05-26 NOTE — Progress Notes (Signed)
Subjective:    Patient ID: Deanna Wall, female    DOB: 1931-09-17, 79 y.o.   MRN: [id number]  HPI  Here to f/u; overall doing ok,  Pt denies chest pain, increased sob or doe, wheezing, orthopnea, PND, increased LE swelling, palpitations, dizziness or syncope.  Pt denies polydipsia, polyuria, or low sugar symptoms such as weakness or confusion improved with po intake.  Pt denies new neurological symptoms such as new headache, or facial or extremity weakness or numbness.   Pt states overall good compliance with meds, has been trying to follow lower cholesterol, diabetic diet, with wt overall stable,  but little exercise however.  No acute complaints.  Overall feels good, just cant walk due to MS Past Medical History  Diagnosis Date  . Multiple sclerosis 10/28/2006  . OSTEOARTHRITIS, KNEES, BILATERAL, SEVERE 06/14/2009  . HYPERLIPIDEMIA 02/16/2010  . ANEMIA, PERNICIOUS 01/30/2009  . HYPERTENSION 10/28/2006  . VITAMIN B12 DEFICIENCY 06/14/2009  . ECZEMA 10/28/2006  . DEGENERATIVE DISC DISEASE 10/28/2006  . Impaired glucose tolerance 12/31/2010  . Sebaceous cyst     chest   . MS (multiple sclerosis)    Past Surgical History  Procedure Laterality Date  . Tubal ligation  1958  . Upper gastrointestinal endoscopy    . Colonoscopy      reports that she has never smoked. She has never used smokeless tobacco. She reports that she does not drink alcohol or use illicit drugs. family history includes Arthritis in her mother; Bone cancer in her mother; and Cancer in her mother. Allergies  Allergen Reactions  . Atorvastatin     REACTION: feels funny in face and head   Current Outpatient Prescriptions on File Prior to Visit  Medication Sig Dispense Refill  . aspirin 81 MG tablet Take 81 mg by mouth daily.        . cloNIDine (CATAPRES) 0.3 MG tablet 1 tab by mouth twice per day  60 tablet  11  . furosemide (LASIX) 20 MG tablet Take 1 tablet (20 mg total) by mouth daily as needed. For swelling  90 tablet   3  . simvastatin (ZOCOR) 40 MG tablet Take 40 mg by mouth every evening.      . fluconazole (DIFLUCAN) 150 MG tablet 1 tab by mouth every 3 days as needed  2 tablet  1   No current facility-administered medications on file prior to visit.   Review of Systems  Constitutional: Negative for unexpected weight change, or unusual diaphoresis  HENT: Negative for tinnitus.   Eyes: Negative for photophobia and visual disturbance.  Respiratory: Negative for choking and stridor.   Gastrointestinal: Negative for vomiting and blood in stool.  Genitourinary: Negative for hematuria and decreased urine volume.  Musculoskeletal: Negative for acute joint swelling Skin: Negative for color change and wound.  Neurological: Negative for tremors and numbness other than noted  Psychiatric/Behavioral: Negative for decreased concentration or  hyperactivity.       Objective:   Physical Exam BP 118/62  Pulse 53  Temp(Src) 97.7 F (36.5 C) (Oral)  Ht 5\' 7"  (1.702 m)  Wt 240 lb (108.863 kg)  BMI 37.58 kg/m2  SpO2 96% VS noted,  Constitutional: Pt appears well-developed and well-nourished.  HENT: Head: NCAT.  Right Ear: External ear normal.  Left Ear: External ear normal.  Eyes: Conjunctivae and EOM are normal. Pupils are equal, round, and reactive to light.  Neck: Normal range of motion. Neck supple.  Cardiovascular: Normal rate and regular rhythm.   Pulmonary/Chest: Effort  normal and breath sounds normal.  Abd:  Soft, NT, non-distended, + BS Neurological: Pt is alert. Not confused  Skin: Skin is warm. No erythema.  Psychiatric: Pt behavior is normal. Thought content normal.     Assessment & Plan:

## 2012-05-26 NOTE — Assessment & Plan Note (Signed)
stable overall by history and exam, recent data reviewed with pt, and pt to continue medical treatment as before,  to f/u any worsening symptoms or concerns Lab Results  Component Value Date   HGBA1C 6.2 05/20/2012

## 2012-05-26 NOTE — Assessment & Plan Note (Signed)
stable overall by history and exam, recent data reviewed with pt, and pt to continue medical treatment as before,  to f/u any worsening symptoms or concerns BP Readings from Last 3 Encounters:  05/26/12 118/62  12/29/11 120/80  12/24/11 130/62

## 2012-07-14 ENCOUNTER — Telehealth: Payer: Self-pay | Admitting: Internal Medicine

## 2012-07-20 NOTE — Telephone Encounter (Signed)
error 

## 2012-07-28 ENCOUNTER — Encounter: Payer: Self-pay | Admitting: Internal Medicine

## 2012-08-05 LAB — HM MAMMOGRAPHY

## 2012-08-06 ENCOUNTER — Other Ambulatory Visit: Payer: Self-pay | Admitting: Internal Medicine

## 2012-09-19 ENCOUNTER — Telehealth: Payer: Self-pay

## 2012-09-19 MED ORDER — AMLODIPINE BESYLATE 5 MG PO TABS: 5.0000 mg | ORAL_TABLET | Freq: Every day | ORAL | Status: DC

## 2012-09-19 MED ORDER — CLONIDINE HCL 0.3 MG PO TABS: ORAL_TABLET | ORAL | Status: DC

## 2012-09-19 NOTE — Telephone Encounter (Signed)
refill 

## 2012-11-08 ENCOUNTER — Encounter: Payer: Self-pay | Admitting: Internal Medicine

## 2012-11-08 ENCOUNTER — Ambulatory Visit (INDEPENDENT_AMBULATORY_CARE_PROVIDER_SITE_OTHER): Payer: Medicare Other | Admitting: Internal Medicine

## 2012-11-08 ENCOUNTER — Other Ambulatory Visit (INDEPENDENT_AMBULATORY_CARE_PROVIDER_SITE_OTHER): Payer: Medicare Other

## 2012-11-08 VITALS — BP 138/80 | HR 60 | Temp 97.4°F | Ht 67.0 in | Wt 240.0 lb

## 2012-11-08 DIAGNOSIS — E538 Deficiency of other specified B group vitamins: Secondary | ICD-10-CM

## 2012-11-08 DIAGNOSIS — R7302 Impaired glucose tolerance (oral): Secondary | ICD-10-CM

## 2012-11-08 DIAGNOSIS — R209 Unspecified disturbances of skin sensation: Secondary | ICD-10-CM

## 2012-11-08 DIAGNOSIS — E785 Hyperlipidemia, unspecified: Secondary | ICD-10-CM

## 2012-11-08 DIAGNOSIS — R7309 Other abnormal glucose: Secondary | ICD-10-CM

## 2012-11-08 DIAGNOSIS — G35 Multiple sclerosis: Secondary | ICD-10-CM

## 2012-11-08 DIAGNOSIS — R202 Paresthesia of skin: Secondary | ICD-10-CM | POA: Insufficient documentation

## 2012-11-08 LAB — CBC WITH DIFFERENTIAL/PLATELET
Basophils Absolute: 0 10*3/uL (ref 0.0–0.1)
Basophils Relative: 0.4 % (ref 0.0–3.0)
Eosinophils Absolute: 0.1 10*3/uL (ref 0.0–0.7)
Hemoglobin: 12.1 g/dL (ref 12.0–15.0)
Lymphocytes Relative: 33.8 % (ref 12.0–46.0)
Lymphs Abs: 2.2 10*3/uL (ref 0.7–4.0)
MCHC: 33.4 g/dL (ref 30.0–36.0)
MCV: 85.1 fl (ref 78.0–100.0)
Monocytes Absolute: 0.5 10*3/uL (ref 0.1–1.0)
Neutro Abs: 3.8 10*3/uL (ref 1.4–7.7)
RBC: 4.26 Mil/uL (ref 3.87–5.11)
RDW: 14.9 % — ABNORMAL HIGH (ref 11.5–14.6)

## 2012-11-08 LAB — HEPATIC FUNCTION PANEL
Alkaline Phosphatase: 70 U/L (ref 39–117)
Bilirubin, Direct: 0.1 mg/dL (ref 0.0–0.3)
Total Bilirubin: 0.8 mg/dL (ref 0.3–1.2)
Total Protein: 7.7 g/dL (ref 6.0–8.3)

## 2012-11-08 LAB — URINALYSIS, ROUTINE W REFLEX MICROSCOPIC
Bilirubin Urine: NEGATIVE
Ketones, ur: NEGATIVE
Urine Glucose: NEGATIVE
pH: 6 (ref 5.0–8.0)

## 2012-11-08 LAB — BASIC METABOLIC PANEL
CO2: 27 mEq/L (ref 19–32)
Calcium: 9 mg/dL (ref 8.4–10.5)
Chloride: 107 mEq/L (ref 96–112)
Creatinine, Ser: 0.8 mg/dL (ref 0.4–1.2)
Sodium: 136 mEq/L (ref 135–145)

## 2012-11-08 LAB — LIPID PANEL
LDL Cholesterol: 78 mg/dL (ref 0–99)
Total CHOL/HDL Ratio: 3

## 2012-11-08 NOTE — Assessment & Plan Note (Signed)
stable overall by history and exam, recent data reviewed with pt, and pt to continue medical treatment as before,  to f/u any worsening symptoms or concerns Lab Results  Component Value Date   LDLCALC 78 11/08/2012

## 2012-11-08 NOTE — Patient Instructions (Signed)
OK to stop the B12 shots Please take OTC B12 vitamin You will be contacted regarding the referral for: neurology ([facility]) Please continue all other medications as before, and refills have been done if requested. Please have the pharmacy call with any other refills you may need.  Please go to the LAB in the Basement (turn left off the elevator) for the tests to be done today You will be contacted by phone if any changes need to be made immediately.  Otherwise, you will receive a letter about your results with an explanation, but please check with MyChart first.  Please remember to sign up for My Chart if you have not done so, as this will be important to you in the future with finding out test results, communicating by private email, and scheduling acute appointments online when needed.  Please return in 6 months, or sooner if needed  OK to cancel Sept 6 appointment

## 2012-11-08 NOTE — Assessment & Plan Note (Signed)
Transient variable site paresthesias, unclear etiology, ? Clinical signficance, for neuro referral

## 2012-11-08 NOTE — Assessment & Plan Note (Signed)
Pt asks change neurology, will defer to pt, for [facility] neuro referral

## 2012-11-08 NOTE — Assessment & Plan Note (Signed)
stable overall by history and exam, recent data reviewed with pt, and pt to continue medical treatment as before,  to f/u any worsening symptoms or concerns Lab Results  Component Value Date   HGBA1C 6.4 11/08/2012

## 2012-11-08 NOTE — Assessment & Plan Note (Signed)
Ok to change IM to oral

## 2012-11-08 NOTE — Progress Notes (Signed)
Subjective:    Patient ID: Deanna Wall, female    DOB: 1932-01-08, 79 y.o.   MRN: [id number]  HPI  Here with 2 wks intermittent numbness to the legs below the knees, right arm and left face as well.  No pain or worsening motor function.  Pt asks for change of neurologist.  Getting monthly IM b12, would like to try oral   Pt denies chest pain, increased sob or doe, wheezing, orthopnea, PND, increased LE swelling, palpitations, dizziness or syncope.  Pt denies polydipsia, polyuria, or low sugar symptoms such as weakness or confusion improved with po intake.  Pt denies new neurological symptoms such as new headache   Pt states overall good compliance with meds, has been trying to follow lower cholesterol, diabetic diet, with wt overall stable.  Remains wheelchair bound.  Past Medical History  Diagnosis Date  . Multiple sclerosis 10/28/2006  . OSTEOARTHRITIS, KNEES, BILATERAL, SEVERE 06/14/2009  . HYPERLIPIDEMIA 02/16/2010  . ANEMIA, PERNICIOUS 01/30/2009  . HYPERTENSION 10/28/2006  . VITAMIN B12 DEFICIENCY 06/14/2009  . ECZEMA 10/28/2006  . DEGENERATIVE DISC DISEASE 10/28/2006  . Impaired glucose tolerance 12/31/2010  . Sebaceous cyst     chest   . MS (multiple sclerosis)    Past Surgical History  Procedure Laterality Date  . Tubal ligation  1958  . Upper gastrointestinal endoscopy    . Colonoscopy      reports that she has never smoked. She has never used smokeless tobacco. She reports that she does not drink alcohol or use illicit drugs. family history includes Arthritis in her mother; Bone cancer in her mother; Cancer in her mother. Allergies  Allergen Reactions  . Atorvastatin     REACTION: feels funny in face and head   Current Outpatient Prescriptions on File Prior to Visit  Medication Sig Dispense Refill  . amLODipine (NORVASC) 5 MG tablet Take 1 tablet (5 mg total) by mouth daily.  90 tablet  3  . aspirin 81 MG tablet Take 81 mg by mouth daily.        . cloNIDine (CATAPRES) 0.3 MG  tablet 1 tab by mouth twice per day  180 tablet  3  . cyanocobalamin (,VITAMIN B-12,) 1000 MCG/ML injection Inject 1 mL (1,000 mcg total) into the muscle every 30 (thirty) days.  30 mL  5  . fluconazole (DIFLUCAN) 150 MG tablet 1 tab by mouth every 3 days as needed  2 tablet  1  . furosemide (LASIX) 20 MG tablet Take 1 tablet (20 mg total) by mouth daily as needed. For swelling  90 tablet  3  . Insulin Pen Needle (BD PEN NEEDLE NANO U/F) 32G X 4 MM MISC 1 application by Does not apply route every 30 (thirty) days.  12 each  5  . NEEDLE, DISP, 25 G 25G X 1" MISC To use with vitamin b12 injection.  100 each  6  . simvastatin (ZOCOR) 40 MG tablet Take 40 mg by mouth every evening.      . simvastatin (ZOCOR) 40 MG tablet TAKE 1 TABLET BY MOUTH DAILY.  90 tablet  3  . Syringe, Disposable, 3 ML MISC To use with Vitamin B12 injection  100 each  6  . traMADol (ULTRAM) 50 MG tablet Take 1 tablet (50 mg total) by mouth every 6 (six) hours as needed for pain.  120 tablet  5   No current facility-administered medications on file prior to visit.   Review of Systems  Constitutional: Negative for unexpected  weight change, or unusual diaphoresis  HENT: Negative for tinnitus.   Eyes: Negative for photophobia and visual disturbance.  Respiratory: Negative for choking and stridor.   Gastrointestinal: Negative for vomiting and blood in stool.  Genitourinary: Negative for hematuria and decreased urine volume.  Musculoskeletal: Negative for acute joint swelling Skin: Negative for color change and wound.  Neurological: Negative for tremors and numbness other than noted  Psychiatric/Behavioral: Negative for decreased concentration or  hyperactivity.       Objective:   Physical Exam BP 138/80  Pulse 60  Temp(Src) 97.4 F (36.3 C) (Oral)  Ht 5\' 7"  (1.702 m)  Wt 240 lb (108.863 kg)  BMI 37.58 kg/m2  SpO2 96% VS noted, not ill appearing Constitutional: Pt appears well-developed and well-nourished.  HENT:  Head: NCAT.  Right Ear: External ear normal.  Left Ear: External ear normal.  Eyes: Conjunctivae and EOM are normal. Pupils are equal, round, and reactive to light.  Neck: Normal range of motion. Neck supple.  Cardiovascular: Normal rate and regular rhythm.   Pulmonary/Chest: Effort normal and breath sounds normal.  Abd:  Soft, NT, non-distended, + BS Neurological: Pt is alert. Not confused  o/w not done in detail Skin: Skin is warm. No erythema. No ulcers Psychiatric: Pt behavior is normal. Thought content normal.     Assessment & Plan:

## 2012-11-25 ENCOUNTER — Ambulatory Visit: Payer: Medicare Other | Admitting: Internal Medicine

## 2012-11-28 ENCOUNTER — Ambulatory Visit (INDEPENDENT_AMBULATORY_CARE_PROVIDER_SITE_OTHER): Payer: Medicare Other | Admitting: Neurology

## 2012-11-28 ENCOUNTER — Encounter: Payer: Self-pay | Admitting: Neurology

## 2012-11-28 VITALS — BP 129/68 | HR 54

## 2012-11-28 DIAGNOSIS — R209 Unspecified disturbances of skin sensation: Secondary | ICD-10-CM

## 2012-11-28 DIAGNOSIS — R269 Unspecified abnormalities of gait and mobility: Secondary | ICD-10-CM

## 2012-11-28 DIAGNOSIS — G35 Multiple sclerosis: Secondary | ICD-10-CM

## 2012-11-28 NOTE — Progress Notes (Signed)
Reason for visit: Multiple sclerosis  Deanna Wall is a 79 y.o. female  History of present illness:  Deanna Wall is a 79 year old right-handed black female with a history of multiple sclerosis that was diagnosed in 1984 by Dr. Maximino Greenland. The patient has had a chronic progressive MS course, and she has developed a paraparesis associated with this. The patient has never been on any medications for multiple sclerosis. The patient indicates that she has lost her ability to ambulate independently 3 or 4 years ago, and she mainly uses a wheelchair or a rolling chair in the house. The patient is able to walk short distances with her walker. The patient has not had any recent falls. Approximately 6 weeks ago, the patient indicates that she developed a fairly sudden onset of burning sensations in both legs up to the knees. This lasted about 4 weeks, and it has resolved completely at this point. The patient denied any change in strength or ability to bear weight on her legs. The patient has chronic constipation, and some urinary frequency and frequent urinary tract infections. The patient has degenerative arthritis involving the hands and the legs. The patient has not had any changes in vision. The patient was last seen in June of 2013 by Dr. Terrace Arabia. In the past, the patient has had mild elevations in CK enzyme levels, and NMO antibodies were negative. MRI evaluation has shown evidence of atrophy of the spinal cord, without discrete lesions. The patient denies neck or low back discomfort, or pain down the lower extremities.  Past Medical History  Diagnosis Date  . Multiple sclerosis 10/28/2006  . OSTEOARTHRITIS, KNEES, BILATERAL, SEVERE 06/14/2009  . HYPERLIPIDEMIA 02/16/2010  . ANEMIA, PERNICIOUS 01/30/2009  . HYPERTENSION 10/28/2006  . VITAMIN B12 DEFICIENCY 06/14/2009  . ECZEMA 10/28/2006  . DEGENERATIVE DISC DISEASE 10/28/2006  . Impaired glucose tolerance 12/31/2010  . Sebaceous cyst     chest   . MS  (multiple sclerosis)   . Gait disorder   . Obesity     Past Surgical History  Procedure Laterality Date  . Tubal ligation  1958  . Upper gastrointestinal endoscopy    . Colonoscopy      Family History  Problem Relation Age of Onset  . Bone cancer Mother   . Arthritis Mother   . Cancer Mother     bone  . Diabetes Sister   . Arthritis Sister     Social history:  reports that she has never smoked. She has never used smokeless tobacco. She reports that she does not drink alcohol or use illicit drugs.  Medications:  Current Outpatient Prescriptions on File Prior to Visit  Medication Sig Dispense Refill  . amLODipine (NORVASC) 5 MG tablet Take 1 tablet (5 mg total) by mouth daily.  90 tablet  3  . aspirin 81 MG tablet Take 81 mg by mouth daily.        . cloNIDine (CATAPRES) 0.3 MG tablet 1 tab by mouth twice per day  180 tablet  3  . cyanocobalamin (,VITAMIN B-12,) 1000 MCG/ML injection Inject 1 mL (1,000 mcg total) into the muscle every 30 (thirty) days.  30 mL  5  . furosemide (LASIX) 20 MG tablet Take 1 tablet (20 mg total) by mouth daily as needed. For swelling  90 tablet  3  . Insulin Pen Needle (BD PEN NEEDLE NANO U/F) 32G X 4 MM MISC 1 application by Does not apply route every 30 (thirty) days.  12 each  5  . NEEDLE, DISP, 25 G 25G X 1" MISC To use with vitamin b12 injection.  100 each  6  . nitrofurantoin (MACRODANTIN) 50 MG capsule Take 50 mg by mouth daily.      . simvastatin (ZOCOR) 40 MG tablet Take 40 mg by mouth every evening.      . Syringe, Disposable, 3 ML MISC To use with Vitamin B12 injection  100 each  6  . traMADol (ULTRAM) 50 MG tablet Take 1 tablet (50 mg total) by mouth every 6 (six) hours as needed for pain.  120 tablet  5   No current facility-administered medications on file prior to visit.      Allergies  Allergen Reactions  . Atorvastatin     REACTION: feels funny in face and head    ROS:  Out of a complete 14 system review of symptoms, the  patient complains only of the following symptoms, and all other reviewed systems are negative.  Swelling in the legs Itching Constipation Joint pain Gait disorder Restless legs  Blood pressure 129/68, pulse 54, weight 0 lb (0 kg).  Physical Exam  General: The patient is alert and cooperative at the time of the examination. The patient is moderately obese.  Head: Pupils are equal, round, and reactive to light. Discs are flat bilaterally.  Neck: The neck is supple, no carotid bruits are noted.  Respiratory: The respiratory examination is clear.  Cardiovascular: The cardiovascular examination reveals a regular rate and rhythm, no obvious murmurs or rubs are noted.  Skin: Extremities are with 2+ edema below the knees bilaterally.  Neurologic Exam  Mental status:  Cranial nerves: Facial symmetry is present. There is good sensation of the face to pinprick and soft touch bilaterally. The strength of the facial muscles and the muscles to head turning and shoulder shrug are normal bilaterally. Speech is well enunciated, no aphasia or dysarthria is noted. Extraocular movements are full. Visual fields are full.  Motor: The motor testing reveals 5 over 5 strength of the upper extremities. With the lower extremities, there is 4/5 strength proximally in the legs, good distal strength is seen. Good symmetric motor tone is noted throughout.  Sensory: Sensory testing is intact to pinprick, soft touch, vibration sensation, and position sense on the upper extremities. With the lower extremities, no definite stocking pattern pinprick sensory deficit is noted. There is a decrease in pinprick sensation on the left leg as compared to the right. Vibration sensation is impaired significantly in both legs well above the knees. Position sense is impaired in both feet, right greater than left. No evidence of extinction is noted.  Coordination: Cerebellar testing reveals good finger-nose-finger and  heel-to-shin bilaterally.  Gait and station: The patient is wheelchair-bound. The patient attempts to stand with assistance, but she is unable to maintain an upright posture. The patient could not be ambulated.  Reflexes: Deep tendon reflexes are symmetric, but are depressed bilaterally. Toes are downgoing bilaterally.   Assessment/Plan:  1. Multiple sclerosis  2. Gait disorder  3. Vitamin B12 deficiency   4. Transient sensory alteration, lower extremities  The patient has had a transient issue with burning sensations in the feet. The patient now claims that she is at her baseline. The patient does have a paraparesis with sensory alteration in both legs. At this point, I will not pursue further workup; the above event may have been related to multiple sclerosis, but the patient has never had discrete MS attacks. If the problem recurs, we  will consider EMG and nerve conduction study, and possible MRI of the lumbosacral spine. The patient will followup in one year. Some blood work will be done today  Marlan Palau MD 11/28/2012 8:11 PM  [facility] [address] [place], [place] [address]  Phone [phone] Fax [phone]

## 2012-11-30 LAB — COPPER, SERUM: Copper: 139 ug/dL (ref 72–166)

## 2012-11-30 LAB — RPR: RPR: NONREACTIVE

## 2012-12-05 NOTE — Progress Notes (Signed)
Quick Note:  I called and spoke to pt and gave her the results of labs. (normal). She relayed understanding verbally. ______

## 2012-12-15 ENCOUNTER — Ambulatory Visit (INDEPENDENT_AMBULATORY_CARE_PROVIDER_SITE_OTHER): Payer: Medicare Other

## 2012-12-15 DIAGNOSIS — Z23 Encounter for immunization: Secondary | ICD-10-CM

## 2013-01-31 ENCOUNTER — Other Ambulatory Visit: Payer: Self-pay | Admitting: Internal Medicine

## 2013-02-24 ENCOUNTER — Ambulatory Visit (INDEPENDENT_AMBULATORY_CARE_PROVIDER_SITE_OTHER): Payer: Medicare Other | Admitting: Internal Medicine

## 2013-02-24 ENCOUNTER — Encounter: Payer: Self-pay | Admitting: Internal Medicine

## 2013-02-24 VITALS — BP 130/82 | HR 49 | Temp 98.3°F

## 2013-02-24 DIAGNOSIS — L259 Unspecified contact dermatitis, unspecified cause: Secondary | ICD-10-CM

## 2013-02-24 DIAGNOSIS — L02419 Cutaneous abscess of limb, unspecified: Secondary | ICD-10-CM

## 2013-02-24 MED ORDER — METHYLPREDNISOLONE ACETATE 80 MG/ML IJ SUSP
80.0000 mg | Freq: Once | INTRAMUSCULAR | Status: AC
  Administered 2013-02-24: 80 mg via INTRAMUSCULAR

## 2013-02-24 MED ORDER — CEPHALEXIN 500 MG PO CAPS: 500.0000 mg | ORAL_CAPSULE | Freq: Three times a day (TID) | ORAL | Status: DC

## 2013-02-24 NOTE — Progress Notes (Signed)
Pre-visit discussion using our clinic review tool. No additional management support is needed unless otherwise documented below in the visit note.  

## 2013-02-24 NOTE — Patient Instructions (Addendum)
It was good to see you today.  Medrol shot given today for allergic reaction and rash  Begin Keflex antibiotics 3 times a day for one week - Your prescription(s) have been submitted to your pharmacy. Please take as directed and contact our office if you believe you are having problem(s) with the medication(s).  Okay to use hydrocortisone to rash as needed  Use moisturizing lotion such as Cetaphil or Jergens to all of your skin after every shower/bath  Eczema Atopic dermatitis, or eczema, is an inherited type of sensitive skin. Often people with eczema have a family history of allergies, asthma, or hay fever. It causes a red itchy rash and dry scaly skin. The itchiness may occur before the skin rash and may be very intense. It is not contagious. Eczema is generally worse during the cooler winter months and often improves with the warmth of summer. Eczema usually starts showing signs in infancy. Some children outgrow eczema, but it may last through adulthood. Flare-ups may be caused by:  Eating something or contact with something you are sensitive or allergic to.  Stress. DIAGNOSIS  The diagnosis of eczema is usually based upon symptoms and medical history. TREATMENT  Eczema cannot be cured, but symptoms usually can be controlled with treatment or avoidance of allergens (things to which you are sensitive or allergic to).  Controlling the itching and scratching.  Use over-the-counter antihistamines as directed for itching. It is especially useful at night when the itching tends to be worse.  Use over-the-counter steroid creams as directed for itching.  Scratching makes the rash and itching worse and may cause impetigo (a skin infection) if fingernails are contaminated (dirty).  Keeping the skin well moisturized with creams every day. This will seal in moisture and help prevent dryness. Lotions containing alcohol and water can dry the skin and are not recommended.  Limiting exposure to  allergens.  Recognizing situations that cause stress.  Developing a plan to manage stress. HOME CARE INSTRUCTIONS   Take prescription and over-the-counter medicines as directed by your caregiver.  Do not use anything on the skin without checking with your caregiver.  Keep baths or showers short (5 minutes) in warm (not hot) water. Use mild cleansers for bathing. You may add non-perfumed bath oil to the bath water. It is best to avoid soap and bubble bath.  Immediately after a bath or shower, when the skin is still damp, apply a moisturizing ointment to the entire body. This ointment should be a petroleum ointment. This will seal in moisture and help prevent dryness. The thicker the ointment the better. These should be unscented.  Keep fingernails cut short and wash hands often. If your child has eczema, it may be necessary to put soft gloves or mittens on your child at night.  Dress in clothes made of cotton or cotton blends. Dress lightly, as heat increases itching.  Avoid foods that may cause flare-ups. Common foods include cow's milk, peanut butter, eggs and wheat.  Keep a child with eczema away from anyone with fever blisters. The virus that causes fever blisters (herpes simplex) can cause a serious skin infection in children with eczema. SEEK MEDICAL CARE IF:   Itching interferes with sleep.  The rash gets worse or is not better within one week following treatment.  The rash looks infected (pus or soft yellow scabs).  You or your child has an oral temperature above 102 F (38.9 C).  Your baby is older than 3 months with a rectal  temperature of 100.5 F (38.1 C) or higher for more than 1 day.  The rash flares up after contact with someone who has fever blisters. SEEK IMMEDIATE MEDICAL CARE IF:   Your baby is older than 3 months with a rectal temperature of 102 F (38.9 C) or higher.  Your baby is older than 3 months or younger with a rectal temperature of 100.4 F (38  C) or higher. Document Released: 03/06/2000 Document Revised: 06/01/2011 Document Reviewed: 10/10/2012 [facility] Patient Information 2014 [place], [place]. Cellulitis Cellulitis is an infection of the skin and the tissue under the skin. The infected area is usually red and tender. This happens most often in the arms and lower legs. HOME CARE   Take your antibiotic medicine as told. Finish the medicine even if you start to feel better.  Keep the infected arm or leg raised (elevated).  Put a warm cloth on the area up to 4 times per day.  Only take medicines as told by your doctor.  Keep all doctor visits as told. GET HELP RIGHT AWAY IF:   You have a fever.  You feel very sleepy.  You throw up (vomit) or have watery poop (diarrhea).  You feel sick and have muscle aches and pains.  You see red streaks on the skin coming from the infected area.  Your red area gets bigger or turns a dark color.  Your bone or joint under the infected area is painful after the skin heals.  Your infection comes back in the same area or different area.  You have a puffy (swollen) bump in the infected area.  You have new symptoms. MAKE SURE YOU:   Understand these instructions.  Will watch your condition.  Will get help right away if you are not doing well or get worse. Document Released: 08/26/2007 Document Revised: 09/08/2011 Document Reviewed: 05/25/2011 [facility] Patient Information 2014 [place], [place].

## 2013-02-24 NOTE — Progress Notes (Signed)
   Subjective:    Patient ID: Deanna Wall, female    DOB: [birth year], 79 y.o.   MRN: [id number]  Rash This is a new problem. Episode onset: 2 weeks. The problem has been gradually worsening since onset. Pain location: L>R LE below knee. The rash is characterized by burning, itchiness, dryness and redness. She was exposed to a spider bite. Pertinent negatives include no cough, diarrhea, fatigue, fever, joint pain or nail changes. Past treatments include topical steroids. The treatment provided no relief. Her past medical history is significant for eczema. There is no history of allergies or asthma.    Past Medical History  Diagnosis Date  . Multiple sclerosis 10/28/2006  . OSTEOARTHRITIS, KNEES, BILATERAL, SEVERE 06/14/2009  . HYPERLIPIDEMIA 02/16/2010  . ANEMIA, PERNICIOUS 01/30/2009  . HYPERTENSION 10/28/2006  . VITAMIN B12 DEFICIENCY 06/14/2009  . ECZEMA 10/28/2006  . DEGENERATIVE DISC DISEASE 10/28/2006  . Impaired glucose tolerance 12/31/2010  . Sebaceous cyst     chest   . MS (multiple sclerosis)   . Gait disorder   . Obesity     Review of Systems  Constitutional: Negative for fever and fatigue.  Respiratory: Negative for cough.   Gastrointestinal: Negative for diarrhea.  Musculoskeletal: Negative for joint pain.  Skin: Positive for rash. Negative for nail changes.       Objective:   Physical Exam BP 130/82  Pulse 49  Temp(Src) 98.3 F (36.8 C) (Oral)  SpO2 98% Wt Readings from Last 3 Encounters:  11/08/12 240 lb (108.863 kg)  05/26/12 240 lb (108.863 kg)  11/24/11 230 lb (104.327 kg)   Constitutional: In WC, she appears well-developed and well-nourished. No distress. Spouse at side  Neck: Normal range of motion. Neck supple. No JVD present. No thyromegaly present.  Cardiovascular: Normal rate, regular rhythm and normal heart sounds.  No murmur heard. No BLE edema. Pulmonary/Chest: Effort normal and breath sounds normal. No respiratory distress. She has no wheezes.    Skin: eczema changes: linear erythema laterally on L>R distal leg above ankle - no abn warmth or streaking - dry cracked skin without confluent erythema, but mild soft tissue swelling. Remaining skin is warm and dry.  Psychiatric: She has a normal mood and affect. Her behavior is normal. Judgment and thought content normal.   Lab Results  Component Value Date   WBC 6.6 11/08/2012   HGB 12.1 11/08/2012   HCT 36.2 11/08/2012   PLT 328.0 11/08/2012   GLUCOSE 109* 11/08/2012   CHOL 158 11/08/2012   TRIG 140.0 11/08/2012   HDL 52.50 11/08/2012   LDLDIRECT 180.0 01/29/2010   LDLCALC 78 11/08/2012   ALT 16 11/08/2012   AST 19 11/08/2012   NA 136 11/08/2012   K 4.3 11/08/2012   CL 107 11/08/2012   CREATININE 0.8 11/08/2012   BUN 11 11/08/2012   CO2 27 11/08/2012   TSH 3.16 11/08/2012   HGBA1C 6.4 11/08/2012       Assessment & Plan:   Eczema -history of same ?Early cellulitis distal leg related to skin breakdown and excoriation  IM Medrol 80 mg today Keflex 3 times a day x7 days  Patient encouraged to hydrate skin with moisturizer daily Okay to continue hydrocortisone to rash as needed

## 2013-05-12 ENCOUNTER — Encounter: Payer: Self-pay | Admitting: Internal Medicine

## 2013-05-12 ENCOUNTER — Ambulatory Visit (INDEPENDENT_AMBULATORY_CARE_PROVIDER_SITE_OTHER): Payer: Medicare Other | Admitting: Internal Medicine

## 2013-05-12 VITALS — BP 120/80 | HR 66 | Temp 98.2°F

## 2013-05-12 DIAGNOSIS — Z23 Encounter for immunization: Secondary | ICD-10-CM

## 2013-05-12 DIAGNOSIS — Z0000 Encounter for general adult medical examination without abnormal findings: Secondary | ICD-10-CM

## 2013-05-12 NOTE — Patient Instructions (Addendum)
You had the new Prevnar pneumonia shot today Please continue all other medications as before, and refills have been done if requested. Please have the pharmacy call with any other refills you may need. Please continue your efforts at being more active as you can, low cholesterol diet, and weight control. You are otherwise up to date with prevention measures today. We can hold off on lab work today as you suggest.   Please remember to sign up for MyChart if you have not done so, as this will be important to you in the future with finding out test results, communicating by private email, and scheduling acute appointments online when needed.  Please return in 6 months, or sooner if needed

## 2013-05-12 NOTE — Addendum Note (Signed)
Addended by: Scharlene GlossEWING, Zola Runion B on: 05/12/2013 09:51 AM   Modules accepted: Orders

## 2013-05-12 NOTE — Progress Notes (Signed)
Pre-visit discussion using our clinic review tool. No additional management support is needed unless otherwise documented below in the visit note.  

## 2013-05-12 NOTE — Assessment & Plan Note (Signed)
Overall doing well, age appropriate education and counseling updated, referrals for preventative services and immunizations addressed, dietary and smoking counseling addressed, most recent labs reviewed.  I have personally reviewed and have noted: 1) the patient's medical and social history 2) The pt's use of alcohol, tobacco, and illicit drugs 3) The patient's current medications and supplements 4) Functional ability including ADL's, fall risk, home safety risk, hearing and visual impairment 5) Diet and physical activities 6) Evidence for depression or mood disorder 7) The patient's height, weight, and BMI have been recorded in the chart I have made referrals, and provided counseling and education based on review of the above Pt reqeusts defer labs today, for Prevnar today

## 2013-05-12 NOTE — Progress Notes (Signed)
Subjective:    Patient ID: Deanna Wall, female    DOB: 03/20/1932, 80 y.o.   MRN: [id number]  HPI   Here for wellness and f/u;  Overall doing ok;  Pt denies CP, worsening SOB, DOE, wheezing, orthopnea, PND, worsening LE edema, palpitations, dizziness or syncope.  Pt denies neurological change such as new headache, facial or extremity weakness.  Pt denies polydipsia, polyuria, or low sugar symptoms. Pt states overall good compliance with treatment and medications, good tolerability, and has been trying to follow lower cholesterol diet.  Pt denies worsening depressive symptoms, suicidal ideation or panic. No fever, night sweats, wt loss, loss of appetite, or other constitutional symptoms.  Pt states poor ability with ADL's, has high fall risk due to MS for which she has been seeing neurology/Dr Anne HahnWillis, home safety reviewed and adequate, no other significant changes in hearing or vision.  No longer takes the tramadol as pain improved Past Medical History  Diagnosis Date  . Multiple sclerosis 10/28/2006  . OSTEOARTHRITIS, KNEES, BILATERAL, SEVERE 06/14/2009  . HYPERLIPIDEMIA 02/16/2010  . ANEMIA, PERNICIOUS 01/30/2009  . HYPERTENSION 10/28/2006  . VITAMIN B12 DEFICIENCY 06/14/2009  . ECZEMA 10/28/2006  . DEGENERATIVE DISC DISEASE 10/28/2006  . Impaired glucose tolerance 12/31/2010  . Sebaceous cyst     chest   . MS (multiple sclerosis)   . Gait disorder   . Obesity    Past Surgical History  Procedure Laterality Date  . Tubal ligation  1958  . Upper gastrointestinal endoscopy    . Colonoscopy      reports that she has never smoked. She has never used smokeless tobacco. She reports that she does not drink alcohol or use illicit drugs. family history includes Arthritis in her mother and sister; Bone cancer in her mother; Cancer in her mother; Diabetes in her sister. Allergies  Allergen Reactions  . Atorvastatin     REACTION: feels funny in face and head   Current Outpatient Prescriptions on  File Prior to Visit  Medication Sig Dispense Refill  . amLODipine (NORVASC) 5 MG tablet Take 1 tablet (5 mg total) by mouth daily.  90 tablet  3  . aspirin 81 MG tablet Take 81 mg by mouth daily.        . cloNIDine (CATAPRES) 0.3 MG tablet 1 tab by mouth twice per day  180 tablet  3  . furosemide (LASIX) 20 MG tablet Take 1 tablet (20 mg total) by mouth daily as needed. For swelling  90 tablet  3  . Insulin Pen Needle (BD PEN NEEDLE NANO U/F) 32G X 4 MM MISC 1 application by Does not apply route every 30 (thirty) days.  12 each  5  . NEEDLE, DISP, 25 G 25G X 1" MISC To use with vitamin b12 injection.  100 each  6  . simvastatin (ZOCOR) 40 MG tablet Take 40 mg by mouth every evening.      . Syringe, Disposable, 3 ML MISC To use with Vitamin B12 injection  100 each  6  . traMADol (ULTRAM) 50 MG tablet Take 1 tablet (50 mg total) by mouth every 6 (six) hours as needed for pain.  120 tablet  5  . vitamin B-12 (CYANOCOBALAMIN) 500 MCG tablet Take 500 mcg by mouth daily.       No current facility-administered medications on file prior to visit.   Review of Systems Constitutional: Negative for diaphoresis, activity change, appetite change or unexpected weight change.  HENT: Negative for hearing loss,  ear pain, facial swelling, mouth sores and neck stiffness.   Eyes: Negative for pain, redness and visual disturbance.  Respiratory: Negative for shortness of breath and wheezing.   Cardiovascular: Negative for chest pain and palpitations.  Gastrointestinal: Negative for diarrhea, blood in stool, abdominal distention or other pain Genitourinary: Negative for hematuria, flank pain or change in urine volume.  Musculoskeletal: Negative for myalgias and joint swelling.  Skin: Negative for color change and wound.  Neurological: Negative for syncope and numbness. other than noted Hematological: Negative for adenopathy.  Psychiatric/Behavioral: Negative for hallucinations, self-injury, decreased  concentration and agitation.      Objective:   Physical Exam BP 120/80  Pulse 66  Temp(Src) 98.2 F (36.8 C) (Oral)  SpO2 95% VS noted, wheelchair bound Constitutional: Pt is oriented to person, place, and time. Appears well-developed and well-nourished.  Head: Normocephalic and atraumatic.  Right Ear: External ear normal.  Left Ear: External ear normal.  Nose: Nose normal.  Mouth/Throat: Oropharynx is clear and moist.  Eyes: Conjunctivae and EOM are normal. Pupils are equal, round, and reactive to light.  Neck: Normal range of motion. Neck supple. No JVD present. No tracheal deviation present.  Cardiovascular: Normal rate, regular rhythm, normal heart sounds and intact distal pulses.   Pulmonary/Chest: Effort normal and breath sounds normal.  Abdominal: Soft. Bowel sounds are normal. There is no tenderness. No HSM  Musculoskeletal: Normal range of motion. Exhibits trace edema RLE only.  Lymphadenopathy:  Has no cervical adenopathy.  Neurological: Pt is alert and oriented to person, place, and time. Pt has normal reflexes. No cranial nerve deficit.  Skin: Skin is warm and dry. No rash noted.  Psychiatric:  Has  normal mood and affect. Behavior is normal.     Assessment & Plan:

## 2013-06-15 ENCOUNTER — Other Ambulatory Visit: Payer: Self-pay | Admitting: Internal Medicine

## 2013-08-03 ENCOUNTER — Encounter: Payer: Self-pay | Admitting: Internal Medicine

## 2013-08-21 ENCOUNTER — Encounter: Payer: Self-pay | Admitting: Nurse Practitioner

## 2013-09-18 ENCOUNTER — Other Ambulatory Visit: Payer: Self-pay | Admitting: Internal Medicine

## 2013-10-12 ENCOUNTER — Other Ambulatory Visit: Payer: Self-pay

## 2013-10-12 MED ORDER — CLONIDINE HCL 0.3 MG PO TABS: ORAL_TABLET | ORAL | Status: DC

## 2013-11-01 ENCOUNTER — Other Ambulatory Visit: Payer: Self-pay | Admitting: Internal Medicine

## 2013-11-09 ENCOUNTER — Encounter: Payer: Self-pay | Admitting: Internal Medicine

## 2013-11-09 ENCOUNTER — Other Ambulatory Visit (INDEPENDENT_AMBULATORY_CARE_PROVIDER_SITE_OTHER): Payer: Medicare Other

## 2013-11-09 ENCOUNTER — Ambulatory Visit (INDEPENDENT_AMBULATORY_CARE_PROVIDER_SITE_OTHER): Payer: Medicare Other | Admitting: Internal Medicine

## 2013-11-09 VITALS — BP 110/62 | HR 51 | Temp 97.4°F | Wt 240.0 lb

## 2013-11-09 DIAGNOSIS — E785 Hyperlipidemia, unspecified: Secondary | ICD-10-CM

## 2013-11-09 DIAGNOSIS — R7302 Impaired glucose tolerance (oral): Secondary | ICD-10-CM

## 2013-11-09 DIAGNOSIS — I10 Essential (primary) hypertension: Secondary | ICD-10-CM

## 2013-11-09 DIAGNOSIS — R7309 Other abnormal glucose: Secondary | ICD-10-CM

## 2013-11-09 DIAGNOSIS — Z23 Encounter for immunization: Secondary | ICD-10-CM

## 2013-11-09 LAB — HEPATIC FUNCTION PANEL
ALBUMIN: 3.4 g/dL — AB (ref 3.5–5.2)
ALT: 13 U/L (ref 0–35)
AST: 21 U/L (ref 0–37)
Alkaline Phosphatase: 72 U/L (ref 39–117)
Bilirubin, Direct: 0.1 mg/dL (ref 0.0–0.3)
TOTAL PROTEIN: 7 g/dL (ref 6.0–8.3)
Total Bilirubin: 0.9 mg/dL (ref 0.2–1.2)

## 2013-11-09 LAB — LIPID PANEL
Cholesterol: 144 mg/dL (ref 0–200)
HDL: 48.9 mg/dL (ref >39.00)
LDL CALC: 78 mg/dL (ref 0–99)
NONHDL: 95.1
Total CHOL/HDL Ratio: 3
Triglycerides: 86 mg/dL (ref 0.0–149.0)
VLDL: 17.2 mg/dL (ref 0.0–40.0)

## 2013-11-09 LAB — BASIC METABOLIC PANEL
BUN: 11 mg/dL (ref 6–23)
CALCIUM: 8.7 mg/dL (ref 8.4–10.5)
CHLORIDE: 107 meq/L (ref 96–112)
CO2: 29 meq/L (ref 19–32)
CREATININE: 0.9 mg/dL (ref 0.4–1.2)
GFR: 82.77 mL/min (ref >60.00)
GLUCOSE: 125 mg/dL — AB (ref 70–99)
Potassium: 3.8 mEq/L (ref 3.5–5.1)
Sodium: 140 mEq/L (ref 135–145)

## 2013-11-09 LAB — HEMOGLOBIN A1C: Hgb A1c MFr Bld: 6.3 % (ref 4.6–6.5)

## 2013-11-09 NOTE — Assessment & Plan Note (Signed)
stable overall by history and exam, recent data reviewed with pt, and pt to continue medical treatment as before,  to f/u any worsening symptoms or concerns ble Lab Results  Component Value Date   HGBA1C 6.4 11/08/2012   For f/u lab

## 2013-11-09 NOTE — Progress Notes (Signed)
Pre visit review using our clinic review tool, if applicable. No additional management support is needed unless otherwise documented below in the visit note. 

## 2013-11-09 NOTE — Assessment & Plan Note (Signed)
stable overall by history and exam, recent data reviewed with pt, and pt to continue medical treatment as before,  to f/u any worsening symptoms or concerns BP Readings from Last 3 Encounters:  11/09/13 110/62  05/12/13 120/80  02/24/13 130/82

## 2013-11-09 NOTE — Patient Instructions (Addendum)

## 2013-11-09 NOTE — Assessment & Plan Note (Signed)
stable overall by history and exam, recent data reviewed with pt, and pt to continue medical treatment as before,  to f/u any worsening symptoms or concerns Lab Results  Component Value Date   LDLCALC 78 11/08/2012   for f/u lab, lower chol diet, cont statin

## 2013-11-09 NOTE — Progress Notes (Signed)
Subjective:    Patient ID: Deanna Wall, female    DOB: 08/10/1931, 80 y.o.   MRN: [id number]  HPI  Here to f/u; overall doing ok,  Pt denies chest pain, increased sob or doe, wheezing, orthopnea, PND, increased LE swelling, palpitations, dizziness or syncope.  Pt denies polydipsia, polyuria, or low sugar symptoms such as weakness or confusion improved with po intake.  Pt denies new neurological symptoms such as new headache, or facial or extremity weakness or numbness.   Pt states overall good compliance with meds, has been trying to follow lower cholesterol, diabetic diet, with wt overall stable,  but little exercise however. Has neurology f/u next mo, can still stand somewhat with walker, can walk short distances room to room.  Does her own ADL's including cooking. Has shower chair for shower. No falls.  Pt denies fever, wt loss, night sweats, loss of appetite, or other constitutional symptoms.  Has BSC for nocturia.   Admits to more cheese and eggs, and sweets lately.  Thinks may have gained some wt - ? Now 250?   Denies worsening depressive symptoms, suicidal ideation, or panic, has minor anxiety at times only. Has some ankle edema during the day, goes away overnight. Has lift chair with leg elevation for during day. Past Medical History  Diagnosis Date  . Multiple sclerosis 10/28/2006  . OSTEOARTHRITIS, KNEES, BILATERAL, SEVERE 06/14/2009  . HYPERLIPIDEMIA 02/16/2010  . ANEMIA, PERNICIOUS 01/30/2009  . HYPERTENSION 10/28/2006  . VITAMIN B12 DEFICIENCY 06/14/2009  . ECZEMA 10/28/2006  . DEGENERATIVE DISC DISEASE 10/28/2006  . Impaired glucose tolerance 12/31/2010  . Sebaceous cyst     chest   . MS (multiple sclerosis)   . Gait disorder   . Obesity    Past Surgical History  Procedure Laterality Date  . Tubal ligation  1958  . Upper gastrointestinal endoscopy    . Colonoscopy      reports that she has never smoked. She has never used smokeless tobacco. She reports that she does not drink  alcohol or use illicit drugs. family history includes Arthritis in her mother and sister; Bone cancer in her mother; Cancer in her mother; Diabetes in her sister. Allergies  Allergen Reactions  . Atorvastatin     REACTION: feels funny in face and head   Current Outpatient Prescriptions on File Prior to Visit  Medication Sig Dispense Refill  . amLODipine (NORVASC) 5 MG tablet TAKE 1 TABLET (5 MG TOTAL) BY MOUTH DAILY.  90 tablet  3  . aspirin 81 MG tablet Take 81 mg by mouth daily.        . cloNIDine (CATAPRES) 0.3 MG tablet 1 tab by mouth twice per day  180 tablet  3  . furosemide (LASIX) 20 MG tablet TAKE 1 TABLET (20 MG TOTAL) BY MOUTH DAILY AS NEEDED FOR SWELLING  90 tablet  3  . Insulin Pen Needle (BD PEN NEEDLE NANO U/F) 32G X 4 MM MISC 1 application by Does not apply route every 30 (thirty) days.  12 each  5  . NEEDLE, DISP, 25 G 25G X 1" MISC To use with vitamin b12 injection.  100 each  6  . simvastatin (ZOCOR) 40 MG tablet Take 40 mg by mouth every evening.      . simvastatin (ZOCOR) 40 MG tablet TAKE 1 TABLET BY MOUTH DAILY.  90 tablet  3  . Syringe, Disposable, 3 ML MISC To use with Vitamin B12 injection  100 each  6  .  vitamin B-12 (CYANOCOBALAMIN) 500 MCG tablet Take 500 mcg by mouth daily.       No current facility-administered medications on file prior to visit.    Review of Systems  Constitutional: Negative for unusual diaphoresis or other sweats  HENT: Negative for ringing in ear Eyes: Negative for double vision or worsening visual disturbance.  Respiratory: Negative for choking and stridor.   Gastrointestinal: Negative for vomiting or other signifcant bowel change Genitourinary: Negative for hematuria or decreased urine volume.  Musculoskeletal: Negative for other MSK pain or swelling Skin: Negative for color change and worsening wound.  Neurological: Negative for tremors and numbness other than noted  Psychiatric/Behavioral: Negative for decreased concentration or  agitation other than above       Objective:   Physical Exam BP 110/62  Pulse 51  Temp(Src) 97.4 F (36.3 C) (Oral)  Wt 240 lb (108.863 kg)  SpO2 95% - weight is estmate only as pt cannot stand for wt check      Assessment & Plan:

## 2013-11-28 ENCOUNTER — Ambulatory Visit: Payer: Medicare Other | Admitting: Nurse Practitioner

## 2013-12-26 ENCOUNTER — Ambulatory Visit (INDEPENDENT_AMBULATORY_CARE_PROVIDER_SITE_OTHER): Payer: Medicare Other | Admitting: Nurse Practitioner

## 2013-12-26 ENCOUNTER — Encounter: Payer: Self-pay | Admitting: Nurse Practitioner

## 2013-12-26 ENCOUNTER — Encounter (INDEPENDENT_AMBULATORY_CARE_PROVIDER_SITE_OTHER): Payer: Self-pay

## 2013-12-26 VITALS — BP 169/75 | HR 71

## 2013-12-26 DIAGNOSIS — R202 Paresthesia of skin: Secondary | ICD-10-CM

## 2013-12-26 DIAGNOSIS — G35 Multiple sclerosis: Secondary | ICD-10-CM

## 2013-12-26 DIAGNOSIS — R269 Unspecified abnormalities of gait and mobility: Secondary | ICD-10-CM

## 2013-12-26 NOTE — Patient Instructions (Signed)
Chronic progressive MS is your diagnosis Please use walker or w/c at all times to prevent falls from gait abnormality F/U yearly and prn

## 2013-12-26 NOTE — Progress Notes (Signed)
[facility]  PATIENT: Deanna Wall DOB: [birth year]   REASON FOR VISIT: Followup for chronic progressive multiple sclerosis    HISTORY OF PRESENT ILLNESS: Deanna Wall, 80 year old female returns for followup. She was last seen in this office to Dr. Anne Hahn 11/28/2012. Original diagnosis in 1984 and she has never been on any medications for multiple sclerosis. She continues to walk short distances in her home with a walker, she is in a wheelchair today. She has not fallen in the last year. When last seen by Dr. Anne Hahn she was having burning sensation in both legs up to the knees. She denies any pain in the low back or in the extremities. This burning sensation is no longer problematic for her. She did not have further workup such as EMG or nerve conduction/MRI of the lumbosacral spine. She returns for reevaluation.     HISTORY: Deanna Wall is a 80 year old right-handed black female with a history of multiple sclerosis that was diagnosed in 1984 by Dr. Maximino Greenland. The patient has had a chronic progressive MS course, and she has developed a paraparesis associated with this. The patient has never been on any medications for multiple sclerosis. The patient indicates that she has lost her ability to ambulate independently 3 or 4 years ago, and she mainly uses a wheelchair or a rolling chair in the house. The patient is able to walk short distances with her walker. The patient has not had any recent falls. Approximately 6 weeks ago, the patient indicates that she developed a fairly sudden onset of burning sensations in both legs up to the knees. This lasted about 4 weeks, and it has resolved completely at this point. The patient denied any change in strength or ability to bear weight on her legs. The patient has chronic constipation, and some urinary frequency and frequent urinary tract infections. The patient has degenerative arthritis involving the hands and the legs. The patient has  not had any changes in vision. The patient was last seen in June of 2013 by Dr. Terrace Arabia. In the past, the patient has had mild elevations in CK enzyme levels, and NMO antibodies were negative. MRI evaluation has shown evidence of atrophy of the spinal cord, without discrete lesions. The patient denies neck or low back discomfort, or pain down the lower extremities.   REVIEW OF SYSTEMS: Full 14 system review of systems performed and notable only for those listed, all others are neg:  Constitutional: N/A  Cardiovascular: Leg swelling  Ear/Nose/Throat: N/A  Skin: N/A  Eyes: N/A  Respiratory: N/A  Gastroitestinal: Constipation, urinary frequency Hematology/Lymphatic: N/A  Endocrine: N/A Musculoskeletal: Walking difficulty  Allergy/Immunology: N/A  Neurological: N/A Psychiatric: N/A Sleep : NA   ALLERGIES: Allergies  Allergen Reactions  . Atorvastatin     REACTION: feels funny in face and head    HOME MEDICATIONS: Outpatient Prescriptions Prior to Visit  Medication Sig Dispense Refill  . amLODipine (NORVASC) 5 MG tablet TAKE 1 TABLET (5 MG TOTAL) BY MOUTH DAILY.  90 tablet  3  . aspirin 81 MG tablet Take 81 mg by mouth daily.        . cloNIDine (CATAPRES) 0.3 MG tablet 1 tab by mouth twice per day  180 tablet  3  . furosemide (LASIX) 20 MG tablet TAKE 1 TABLET (20 MG TOTAL) BY MOUTH DAILY AS NEEDED FOR SWELLING  90 tablet  3  . simvastatin (ZOCOR) 40 MG tablet Take 40 mg by mouth every evening.      Marland Kitchen  vitamin B-12 (CYANOCOBALAMIN) 500 MCG tablet Take 500 mcg by mouth daily.      . Insulin Pen Needle (BD PEN NEEDLE NANO U/F) 32G X 4 MM MISC 1 application by Does not apply route every 30 (thirty) days.  12 each  5  . NEEDLE, DISP, 25 G 25G X 1" MISC To use with vitamin b12 injection.  100 each  6  . simvastatin (ZOCOR) 40 MG tablet TAKE 1 TABLET BY MOUTH DAILY.  90 tablet  3  . Syringe, Disposable, 3 ML MISC To use with Vitamin B12 injection  100 each  6   No facility-administered  medications prior to visit.    PAST MEDICAL HISTORY: Past Medical History  Diagnosis Date  . Multiple sclerosis 10/28/2006  . OSTEOARTHRITIS, KNEES, BILATERAL, SEVERE 06/14/2009  . HYPERLIPIDEMIA 02/16/2010  . ANEMIA, PERNICIOUS 01/30/2009  . HYPERTENSION 10/28/2006  . VITAMIN B12 DEFICIENCY 06/14/2009  . ECZEMA 10/28/2006  . DEGENERATIVE DISC DISEASE 10/28/2006  . Impaired glucose tolerance 12/31/2010  . Sebaceous cyst     chest   . MS (multiple sclerosis)   . Gait disorder   . Obesity     PAST SURGICAL HISTORY: Past Surgical History  Procedure Laterality Date  . Tubal ligation  1958  . Upper gastrointestinal endoscopy    . Colonoscopy      FAMILY HISTORY: Family History  Problem Relation Age of Onset  . Bone cancer Mother   . Arthritis Mother   . Cancer Mother     bone  . Diabetes Sister   . Arthritis Sister     SOCIAL HISTORY: History   Social History  . Marital Status: Married    Spouse Name: N/A    Number of Children: 5  . Years of Education: 11TH    Occupational History  . homemaker    Social History Main Topics  . Smoking status: Never Smoker   . Smokeless tobacco: Never Used  . Alcohol Use: No  . Drug Use: No  . Sexual Activity: Not on file   Other Topics Concern  . Not on file   Social History Narrative  . No narrative on file     PHYSICAL EXAM  Filed Vitals:   12/26/13 0928  BP: 169/75  Pulse: 71   Cannot calculate BMI with a height equal to zero. General: The patient is alert and cooperative at the time of the examination. The patient is moderately obese.  Head: Normocephalic and atraumatic. Oropharynx benign Neck: The neck is supple, no carotid bruits are noted.  Respiratory: The respiratory examination is clear.  Cardiovascular: The cardiovascular examination reveals a regular rate and rhythm, no obvious murmurs or rubs are noted.  Skin: Extremities are with 1+ edema below the knees bilaterally.  Neurologic Exam  Mental status:    Cranial nerves: Pupils are equal, round, and reactive to light. Discs are flat bilaterally.  Extraocular movements are full. Visual fields are full.Facial symmetry is present. There is good sensation of the face to pinprick and soft touch bilaterally. The strength of the facial muscles and the muscles to head turning and shoulder shrug are normal bilaterally. Speech is well enunciated, no aphasia or dysarthria is noted.   Motor: The motor testing reveals 5 over 5 strength of the upper extremities. With the lower extremities, there is 4/5 strength proximally in the legs, good distal strength is seen. Good symmetric motor tone is noted throughout.  Sensory: Sensory testing is intact to pinprick, soft touch, vibration sensation, and  position sense on the upper extremities. With the lower extremities, no definite stocking pattern pinprick sensory deficit is noted. There is a decrease in pinprick sensation on the left leg as compared to the right. Vibration sensation is impaired significantly in both legs well above the knees. Position sense is impaired in both feet, right greater than left. No evidence of extinction is noted.  Coordination: Cerebellar testing reveals good finger-nose-finger and heel-to-shin bilaterally.  Gait and station: The patient is wheelchair-bound.   Reflexes: Deep tendon reflexes are symmetric, but are depressed bilaterally. Toes are downgoing bilaterally.    DIAGNOSTIC DATA (LABS, IMAGING, TESTING) - I reviewed patient records, labs, notes, testing and imaging myself where available.  Lab Results  Component Value Date   WBC 6.6 11/08/2012   HGB 12.1 11/08/2012   HCT 36.2 11/08/2012   MCV 85.1 11/08/2012   PLT 328.0 11/08/2012      Component Value Date/Time   NA 140 11/09/2013 1031   K 3.8 11/09/2013 1031   CL 107 11/09/2013 1031   CO2 29 11/09/2013 1031   GLUCOSE 125* 11/09/2013 1031   BUN 11 11/09/2013 1031   CREATININE 0.9 11/09/2013 1031   CALCIUM 8.7 11/09/2013 1031   PROT  7.0 11/09/2013 1031   ALBUMIN 3.4* 11/09/2013 1031   AST 21 11/09/2013 1031   ALT 13 11/09/2013 1031   ALKPHOS 72 11/09/2013 1031   BILITOT 0.9 11/09/2013 1031   GFRNONAA 80* 04/14/2011 1120   GFRAA >90 04/14/2011 1120   Lab Results  Component Value Date   CHOL 144 11/09/2013   HDL 48.90 11/09/2013   LDLCALC 78 11/09/2013   LDLDIRECT 180.0 01/29/2010   TRIG 86.0 11/09/2013   CHOLHDL 3 11/09/2013   Lab Results  Component Value Date   HGBA1C 6.3 11/09/2013    Lab Results  Component Value Date   TSH 3.16 11/08/2012      ASSESSMENT AND PLAN  80 y.o. year old female  has a past medical history of Multiple sclerosis (10/28/2006); Gait disorder; here to follow up.   Chronic progressive MS is your diagnosis(patient asked to have this written down) Please use walker or w/c at all times to prevent falls from gait abnormality F/U yearly and prn Nilda Riggs, [facility], [facility], APRN  [facility] [address] [place], [place] [address] [phone]

## 2013-12-26 NOTE — Progress Notes (Signed)
I have read the note, and I agree with the clinical assessment and plan.  Donelda Mailhot KEITH   

## 2014-02-18 ENCOUNTER — Other Ambulatory Visit: Payer: Self-pay | Admitting: Internal Medicine

## 2014-05-15 ENCOUNTER — Encounter: Payer: Self-pay | Admitting: Internal Medicine

## 2014-05-15 ENCOUNTER — Ambulatory Visit (INDEPENDENT_AMBULATORY_CARE_PROVIDER_SITE_OTHER): Payer: Medicare Other | Admitting: Internal Medicine

## 2014-05-15 ENCOUNTER — Other Ambulatory Visit (INDEPENDENT_AMBULATORY_CARE_PROVIDER_SITE_OTHER): Payer: Medicare Other

## 2014-05-15 VITALS — BP 138/80 | HR 53 | Temp 97.7°F | Resp 18

## 2014-05-15 DIAGNOSIS — Z0000 Encounter for general adult medical examination without abnormal findings: Secondary | ICD-10-CM

## 2014-05-15 DIAGNOSIS — R7302 Impaired glucose tolerance (oral): Secondary | ICD-10-CM

## 2014-05-15 DIAGNOSIS — E785 Hyperlipidemia, unspecified: Secondary | ICD-10-CM

## 2014-05-15 LAB — HEPATIC FUNCTION PANEL
ALK PHOS: 80 U/L (ref 39–117)
ALT: 11 U/L (ref 0–35)
AST: 15 U/L (ref 0–37)
Albumin: 3.7 g/dL (ref 3.5–5.2)
BILIRUBIN DIRECT: 0.1 mg/dL (ref 0.0–0.3)
BILIRUBIN TOTAL: 0.6 mg/dL (ref 0.2–1.2)
Total Protein: 7.1 g/dL (ref 6.0–8.3)

## 2014-05-15 LAB — CBC WITH DIFFERENTIAL/PLATELET
Basophils Absolute: 0 10*3/uL (ref 0.0–0.1)
Basophils Relative: 0.5 % (ref 0.0–3.0)
Eosinophils Absolute: 0.1 10*3/uL (ref 0.0–0.7)
Eosinophils Relative: 1.3 % (ref 0.0–5.0)
HCT: 34.8 % — ABNORMAL LOW (ref 36.0–46.0)
Hemoglobin: 11.6 g/dL — ABNORMAL LOW (ref 12.0–15.0)
Lymphocytes Relative: 27.3 % (ref 12.0–46.0)
Lymphs Abs: 1.7 10*3/uL (ref 0.7–4.0)
MCHC: 33.3 g/dL (ref 30.0–36.0)
MCV: 84.2 fl (ref 78.0–100.0)
Monocytes Absolute: 0.4 10*3/uL (ref 0.1–1.0)
Monocytes Relative: 6.4 % (ref 3.0–12.0)
NEUTROS ABS: 4 10*3/uL (ref 1.4–7.7)
NEUTROS PCT: 64.5 % (ref 43.0–77.0)
Platelets: 332 10*3/uL (ref 150.0–400.0)
RBC: 4.14 Mil/uL (ref 3.87–5.11)
RDW: 14.6 % (ref 11.5–15.5)
WBC: 6.1 10*3/uL (ref 4.0–10.5)

## 2014-05-15 LAB — BASIC METABOLIC PANEL
BUN: 9 mg/dL (ref 6–23)
CHLORIDE: 107 meq/L (ref 96–112)
CO2: 29 mEq/L (ref 19–32)
CREATININE: 0.71 mg/dL (ref 0.40–1.20)
Calcium: 8.8 mg/dL (ref 8.4–10.5)
GFR: 101.74 mL/min (ref >60.00)
Glucose, Bld: 105 mg/dL — ABNORMAL HIGH (ref 70–99)
Potassium: 3.9 mEq/L (ref 3.5–5.1)
Sodium: 141 mEq/L (ref 135–145)

## 2014-05-15 LAB — LIPID PANEL
CHOLESTEROL: 139 mg/dL (ref 0–200)
HDL: 48.2 mg/dL (ref >39.00)
LDL CALC: 71 mg/dL (ref 0–99)
NonHDL: 90.8
Total CHOL/HDL Ratio: 3
Triglycerides: 97 mg/dL (ref 0.0–149.0)
VLDL: 19.4 mg/dL (ref 0.0–40.0)

## 2014-05-15 LAB — URINALYSIS, ROUTINE W REFLEX MICROSCOPIC
Bilirubin Urine: NEGATIVE
KETONES UR: NEGATIVE
Nitrite: NEGATIVE
SPECIFIC GRAVITY, URINE: 1.01 (ref 1.000–1.030)
TOTAL PROTEIN, URINE-UPE24: NEGATIVE
URINE GLUCOSE: NEGATIVE
UROBILINOGEN UA: 0.2 (ref 0.0–1.0)
pH: 6.5 (ref 5.0–8.0)

## 2014-05-15 LAB — TSH: TSH: 2.33 u[IU]/mL (ref 0.35–4.50)

## 2014-05-15 LAB — HEMOGLOBIN A1C: Hgb A1c MFr Bld: 6.4 % (ref 4.6–6.5)

## 2014-05-15 NOTE — Assessment & Plan Note (Signed)

## 2014-05-15 NOTE — Patient Instructions (Signed)

## 2014-05-15 NOTE — Assessment & Plan Note (Signed)
stable overall by history and exam, recent data reviewed with pt, and pt to continue medical treatment as before,  to f/u any worsening symptoms or concerns Lab Results  Component Value Date   HGBA1C 6.3 11/09/2013

## 2014-05-15 NOTE — Progress Notes (Signed)
Subjective:    Patient ID: Deanna Wall, female    DOB: [birth year], 81 y.o.   MRN: [id number]  HPI  Here for wellness and f/u;  Overall doing ok;  Pt denies CP, worsening SOB, DOE, wheezing, orthopnea, PND, worsening LE edema, palpitations, dizziness or syncope.  Pt denies neurological change such as new headache, facial or extremity weakness.  Pt denies polydipsia, polyuria, or low sugar symptoms. Pt states overall good compliance with treatment and medications, good tolerability, and has been trying to follow lower cholesterol diet.  Pt denies worsening depressive symptoms, suicidal ideation or panic. No fever, night sweats, wt loss, loss of appetite, or other constitutional symptoms.  Pt states good ability with ADL's, has low fall risk, home safety reviewed and adequate, no other significant changes in hearing or vision, remains wheelchair bound. Past Medical History  Diagnosis Date  . Multiple sclerosis 10/28/2006  . OSTEOARTHRITIS, KNEES, BILATERAL, SEVERE 06/14/2009  . HYPERLIPIDEMIA 02/16/2010  . ANEMIA, PERNICIOUS 01/30/2009  . HYPERTENSION 10/28/2006  . VITAMIN B12 DEFICIENCY 06/14/2009  . ECZEMA 10/28/2006  . DEGENERATIVE DISC DISEASE 10/28/2006  . Impaired glucose tolerance 12/31/2010  . Sebaceous cyst     chest   . MS (multiple sclerosis)   . Gait disorder   . Obesity    Past Surgical History  Procedure Laterality Date  . Tubal ligation  1958  . Upper gastrointestinal endoscopy    . Colonoscopy      reports that she has never smoked. She has never used smokeless tobacco. She reports that she does not drink alcohol or use illicit drugs. family history includes Arthritis in her mother and sister; Bone cancer in her mother; Cancer in her mother; Diabetes in her sister. Allergies  Allergen Reactions  . Atorvastatin     REACTION: feels funny in face and head   Current Outpatient Prescriptions on File Prior to Visit  Medication Sig Dispense Refill  . amLODipine (NORVASC) 5 MG  tablet TAKE 1 TABLET (5 MG TOTAL) BY MOUTH DAILY. 90 tablet 3  . aspirin 81 MG tablet Take 81 mg by mouth daily.      . cloNIDine (CATAPRES) 0.3 MG tablet 1 tab by mouth twice per day 180 tablet 3  . furosemide (LASIX) 20 MG tablet TAKE 1 TABLET (20 MG TOTAL) BY MOUTH DAILY AS NEEDED FOR SWELLING 90 tablet 3  . nitrofurantoin (MACRODANTIN) 50 MG capsule Take 50 mg by mouth daily.    . nitrofurantoin (MACRODANTIN) 50 MG capsule TAKE 1 CAPSULE (50 MG TOTAL) BY MOUTH DAILY. 90 capsule 2  . simvastatin (ZOCOR) 40 MG tablet Take 40 mg by mouth every evening.    . vitamin B-12 (CYANOCOBALAMIN) 500 MCG tablet Take 500 mcg by mouth daily.     No current facility-administered medications on file prior to visit.   Review of Systems Constitutional: Negative for increased diaphoresis, other activity, appetite or other siginficant weight change  HENT: Negative for worsening hearing loss, ear pain, facial swelling, mouth sores and neck stiffness.   Eyes: Negative for other worsening pain, redness or visual disturbance.  Respiratory: Negative for shortness of breath and wheezing.   Cardiovascular: Negative for chest pain and palpitations.  Gastrointestinal: Negative for diarrhea, blood in stool, abdominal distention or other pain Genitourinary: Negative for hematuria, flank pain or change in urine volume.  Musculoskeletal: Negative for myalgias or other joint complaints.  Skin: Negative for color change and wound.  Neurological: Negative for syncope and numbness. other than noted Hematological: Negative  for adenopathy. or other swelling Psychiatric/Behavioral: Negative for hallucinations, self-injury, decreased concentration or other worsening agitation.      Objective:   Physical Exam BP 138/80 mmHg  Pulse 53  Temp(Src) 97.7 F (36.5 C) (Oral)  Resp 18  SpO2 96% VS noted,  Constitutional: Pt is oriented to person, place, and time. Appears well-developed and well-nourished.  Head: Normocephalic  and atraumatic.  Right Ear: External ear normal.  Left Ear: External ear normal.  Nose: Nose normal.  Mouth/Throat: Oropharynx is clear and moist.  Eyes: Conjunctivae and EOM are normal. Pupils are equal, round, and reactive to light.  Neck: Normal range of motion. Neck supple. No JVD present. No tracheal deviation present.  Cardiovascular: Normal rate, regular rhythm, normal heart sounds and intact distal pulses.   Pulmonary/Chest: Effort normal and breath sounds without rales or wheezing  Abdominal: Soft. Bowel sounds are normal. NT. No HSM  Musculoskeletal: Normal range of motion. Exhibits no edema.  Lymphadenopathy:  Has no cervical adenopathy.  Neurological: Pt is alert and oriented to person, place, and time. Pt has normal reflexes. No cranial nerve deficit. Motor grossly intact to UE's Skin: Skin is warm and dry. No rash noted.  Psychiatric:  Has normal mood and affect. Behavior is normal.     Assessment & Plan:

## 2014-05-16 ENCOUNTER — Other Ambulatory Visit (INDEPENDENT_AMBULATORY_CARE_PROVIDER_SITE_OTHER): Payer: Medicare Other

## 2014-05-16 ENCOUNTER — Encounter: Payer: Self-pay | Admitting: Internal Medicine

## 2014-05-16 DIAGNOSIS — D649 Anemia, unspecified: Secondary | ICD-10-CM

## 2014-05-16 LAB — IBC PANEL
Iron: 44 ug/dL (ref 42–145)
Saturation Ratios: 13.8 % — ABNORMAL LOW (ref 20.0–50.0)
Transferrin: 228 mg/dL (ref 212.0–360.0)

## 2014-05-23 ENCOUNTER — Ambulatory Visit: Payer: Medicare Other | Admitting: Internal Medicine

## 2014-06-03 ENCOUNTER — Other Ambulatory Visit: Payer: Self-pay | Admitting: Internal Medicine

## 2014-06-05 ENCOUNTER — Encounter: Payer: Self-pay | Admitting: Internal Medicine

## 2014-06-05 ENCOUNTER — Ambulatory Visit (INDEPENDENT_AMBULATORY_CARE_PROVIDER_SITE_OTHER): Payer: Medicare Other | Admitting: Internal Medicine

## 2014-06-05 VITALS — BP 136/88 | HR 60 | Temp 98.7°F | Resp 18

## 2014-06-05 DIAGNOSIS — R110 Nausea: Secondary | ICD-10-CM | POA: Insufficient documentation

## 2014-06-05 DIAGNOSIS — R7302 Impaired glucose tolerance (oral): Secondary | ICD-10-CM

## 2014-06-05 DIAGNOSIS — J309 Allergic rhinitis, unspecified: Secondary | ICD-10-CM

## 2014-06-05 DIAGNOSIS — I10 Essential (primary) hypertension: Secondary | ICD-10-CM

## 2014-06-05 MED ORDER — ONDANSETRON HCL 4 MG PO TABS: 4.0000 mg | ORAL_TABLET | Freq: Three times a day (TID) | ORAL | Status: DC | PRN

## 2014-06-05 MED ORDER — PREDNISONE 10 MG PO TABS: ORAL_TABLET | ORAL | Status: DC

## 2014-06-05 MED ORDER — LEVOCETIRIZINE DIHYDROCHLORIDE 5 MG PO TABS: 5.0000 mg | ORAL_TABLET | Freq: Every evening | ORAL | Status: DC

## 2014-06-05 MED ORDER — MOMETASONE FUROATE 50 MCG/ACT NA SUSP: 2.0000 | Freq: Every day | NASAL | Status: DC

## 2014-06-05 MED ORDER — CETIRIZINE HCL 10 MG PO TABS: 10.0000 mg | ORAL_TABLET | Freq: Every day | ORAL | Status: DC

## 2014-06-05 NOTE — Patient Instructions (Signed)
Please take all new medication as prescribed - the prednisone and nausea medication (sent to your pharmacy)  I have also given hardcopy prescriptions for Xyzal (similar to Zyrtec) and Nasonex nasal spray in case this is less expensive then OTC Zyrtec and Nasacort  Please continue all other medications as before, and refills have been done if requested.  Please have the pharmacy call with any other refills you may need.  Please keep your appointments with your specialists as you may have planned

## 2014-06-05 NOTE — Progress Notes (Signed)
Subjective:    Patient ID: Deanna Wall, female    DOB: [birth year], 81 y.o.   MRN: [id number]  HPI  Here to f/iu - Does have 8 days acute ongoing nasal allergy symptoms with clearish congestion, itch and sneezing, without fever, pain, ST, cough, swelling or wheezing.  No sick contacts and o/w does not feel ill.  Has mild throat congestion that seems to cause nausea - clear color, no blood.    Pt denies chest pain, increased sob or doe, wheezing, orthopnea, PND, increased LE swelling, palpitations, dizziness or syncope.  Pt denies new neurological symptoms such as new headache, or facial or extremity weakness or numbness  Occas constipation bothers her., Denies worsening reflux, abd pain, dysphagia, n/v, other bowel change or blood.  Pt denies polydipsia, polyuria,   Past Medical History  Diagnosis Date  . Multiple sclerosis 10/28/2006  . OSTEOARTHRITIS, KNEES, BILATERAL, SEVERE 06/14/2009  . HYPERLIPIDEMIA 02/16/2010  . ANEMIA, PERNICIOUS 01/30/2009  . HYPERTENSION 10/28/2006  . VITAMIN B12 DEFICIENCY 06/14/2009  . ECZEMA 10/28/2006  . DEGENERATIVE DISC DISEASE 10/28/2006  . Impaired glucose tolerance 12/31/2010  . Sebaceous cyst     chest   . MS (multiple sclerosis)   . Gait disorder   . Obesity    Past Surgical History  Procedure Laterality Date  . Tubal ligation  1958  . Upper gastrointestinal endoscopy    . Colonoscopy      reports that she has never smoked. She has never used smokeless tobacco. She reports that she does not drink alcohol or use illicit drugs. family history includes Arthritis in her mother and sister; Bone cancer in her mother; Cancer in her mother; Diabetes in her sister. Allergies  Allergen Reactions  . Atorvastatin     REACTION: feels funny in face and head   Current Outpatient Prescriptions on File Prior to Visit  Medication Sig Dispense Refill  . amLODipine (NORVASC) 5 MG tablet TAKE 1 TABLET (5 MG TOTAL) BY MOUTH DAILY. 90 tablet 3  . aspirin 81 MG tablet  Take 81 mg by mouth daily.      . cloNIDine (CATAPRES) 0.3 MG tablet 1 tab by mouth twice per day 180 tablet 3  . furosemide (LASIX) 20 MG tablet TAKE 1 TABLET (20 MG TOTAL) BY MOUTH DAILY AS NEEDED FOR SWELLING 90 tablet 3  . nitrofurantoin (MACRODANTIN) 50 MG capsule Take 50 mg by mouth daily.    . nitrofurantoin (MACRODANTIN) 50 MG capsule TAKE 1 CAPSULE (50 MG TOTAL) BY MOUTH DAILY. 90 capsule 2  . simvastatin (ZOCOR) 40 MG tablet Take 40 mg by mouth every evening.    . vitamin B-12 (CYANOCOBALAMIN) 500 MCG tablet Take 500 mcg by mouth daily.     No current facility-administered medications on file prior to visit.   Review of Systems  Constitutional: Negative for unusual diaphoresis or night sweats HENT: Negative for ringing in ear or discharge Eyes: Negative for double vision or worsening visual disturbance.  Respiratory: Negative for choking and stridor.   Gastrointestinal: Negative for vomiting or other signifcant bowel change Genitourinary: Negative for hematuria or change in urine volume.  Musculoskeletal: Negative for other MSK pain or swelling Skin: Negative for color change and worsening wound.  Neurological: Negative for tremors and numbness other than noted  Psychiatric/Behavioral: Negative for decreased concentration or agitation other than above  '    Objective:   Physical Exam BP 136/88 mmHg  Pulse 60  Temp(Src) 98.7 F (37.1 C)  Resp 18  SpO2 95% VS noted, not ill appearing Constitutional: Pt appears in no significant distress HENT: Head: NCAT.  Right Ear: External ear normal.  Left Ear: External ear normal.  Eyes: . Pupils are equal, round, and reactive to light. Conjunctivae and EOM are normal Neck: Normal range of motion. Neck supple.  Cardiovascular: Normal rate and regular rhythm.   Pulmonary/Chest: Effort normal and breath sounds without rales or wheezing.  Abd:  Soft, NT, ND, + BS Neurological: Pt is alert. Not confused , motor grossly intact Skin:  Skin is warm. No rash, no LE edema Psychiatric: Pt behavior is normal. No agitation.       Assessment & Plan:

## 2014-06-05 NOTE — Progress Notes (Signed)
Pre visit review using our clinic review tool, if applicable. No additional management support is needed unless otherwise documented below in the visit note. 

## 2014-06-08 NOTE — Assessment & Plan Note (Signed)
stable overall by history and exam, recent data reviewed with pt, and pt to continue medical treatment as before,  to f/u any worsening symptoms or concerns BP Readings from Last 3 Encounters:  06/05/14 136/88  05/15/14 138/80  12/26/13 169/75

## 2014-06-08 NOTE — Assessment & Plan Note (Signed)
Also for zofran prn, but suspect allergy control will help with this as well

## 2014-06-08 NOTE — Assessment & Plan Note (Signed)
Mild to mod flare, for short course low dose predpac asd, zyrtec and nasacort more long term asd,  to f/u any worsening symptoms or concerns, consider allergy referral

## 2014-06-08 NOTE — Assessment & Plan Note (Signed)
stable overall by history and exam, recent data reviewed with pt, and pt to continue medical treatment as before,  to f/u any worsening symptoms or concerns Lab Results  Component Value Date   HGBA1C 6.4 05/15/2014

## 2014-08-03 ENCOUNTER — Encounter: Payer: Self-pay | Admitting: Internal Medicine

## 2014-09-03 ENCOUNTER — Telehealth: Payer: Self-pay | Admitting: Internal Medicine

## 2014-09-03 MED ORDER — NITROFURANTOIN MACROCRYSTAL 50 MG PO CAPS: 50.0000 mg | ORAL_CAPSULE | Freq: Every day | ORAL | Status: DC

## 2014-09-03 NOTE — Telephone Encounter (Signed)
Patient is requesting script refill of nitrofurantoin to be sent to [facility] on [address]

## 2014-09-03 NOTE — Telephone Encounter (Signed)
erx done

## 2014-09-10 ENCOUNTER — Ambulatory Visit (INDEPENDENT_AMBULATORY_CARE_PROVIDER_SITE_OTHER): Payer: Medicare Other | Admitting: Internal Medicine

## 2014-09-10 VITALS — BP 138/70 | HR 54 | Temp 98.5°F | Resp 16 | Ht 67.0 in

## 2014-09-10 DIAGNOSIS — I10 Essential (primary) hypertension: Secondary | ICD-10-CM | POA: Diagnosis not present

## 2014-09-10 DIAGNOSIS — H6122 Impacted cerumen, left ear: Secondary | ICD-10-CM

## 2014-09-10 NOTE — Progress Notes (Signed)
Pre visit review using our clinic review tool, if applicable. No additional management support is needed unless otherwise documented below in the visit note. 

## 2014-09-11 ENCOUNTER — Encounter: Payer: Self-pay | Admitting: Internal Medicine

## 2014-09-11 DIAGNOSIS — H6120 Impacted cerumen, unspecified ear: Secondary | ICD-10-CM | POA: Insufficient documentation

## 2014-09-11 NOTE — Progress Notes (Signed)
Subjective:  Patient ID: Deanna Wall, female    DOB: 05/21/1931  Age: 81 y.o. MRN: [id number]  CC: Cerumen Impaction   HPI Deanna Wall presents for a muffled sensation in her left ear, she thinks it is wax and she wants to have it removed.  Outpatient Prescriptions Prior to Visit  Medication Sig Dispense Refill  . amLODipine (NORVASC) 5 MG tablet TAKE 1 TABLET (5 MG TOTAL) BY MOUTH DAILY. 90 tablet 3  . aspirin 81 MG tablet Take 81 mg by mouth daily.      . cloNIDine (CATAPRES) 0.3 MG tablet 1 tab by mouth twice per day 180 tablet 3  . furosemide (LASIX) 20 MG tablet TAKE 1 TABLET (20 MG TOTAL) BY MOUTH DAILY AS NEEDED FOR SWELLING 90 tablet 3  . levocetirizine (XYZAL) 5 MG tablet Take 1 tablet (5 mg total) by mouth every evening. 30 tablet 11  . mometasone (NASONEX) 50 MCG/ACT nasal spray Place 2 sprays into the nose daily. 17 g 12  . nitrofurantoin (MACRODANTIN) 50 MG capsule Take 1 capsule (50 mg total) by mouth daily. 90 capsule 1  . ondansetron (ZOFRAN) 4 MG tablet Take 1 tablet (4 mg total) by mouth every 8 (eight) hours as needed for nausea or vomiting. 25 tablet 1  . simvastatin (ZOCOR) 40 MG tablet Take 40 mg by mouth every evening.    . vitamin B-12 (CYANOCOBALAMIN) 500 MCG tablet Take 500 mcg by mouth daily.    . predniSONE (DELTASONE) 10 MG tablet 3 tabs by mouth per day for 3 days,2tabs per day for 3 days,1tab per day for 3 days 18 tablet 0   No facility-administered medications prior to visit.    ROS Review of Systems  Constitutional: Negative.  Negative for fever, chills, diaphoresis, appetite change and fatigue.  HENT: Negative.  Negative for congestion, ear discharge, ear pain, facial swelling, postnasal drip, rhinorrhea, sinus pressure, sneezing, sore throat, trouble swallowing and voice change.   Eyes: Negative.   Respiratory: Negative.  Negative for cough, choking, chest tightness, shortness of breath and stridor.   Cardiovascular: Negative.  Negative for  chest pain, palpitations and leg swelling.  Gastrointestinal: Negative.  Negative for abdominal pain.  Endocrine: Negative.   Genitourinary: Negative.   Musculoskeletal: Negative.   Skin: Negative.   Allergic/Immunologic: Negative.   Neurological: Negative.   Hematological: Negative.  Negative for adenopathy. Does not bruise/bleed easily.  Psychiatric/Behavioral: Negative.     Objective:  BP 138/70 mmHg  Pulse 54  Temp(Src) 98.5 F (36.9 C) (Oral)  Ht  (1.702 m)  Wt   SpO2 95%  BP Readings from Last 3 Encounters:  09/10/14 138/70  06/05/14 136/88  05/15/14 138/80    Wt Readings from Last 3 Encounters:  11/09/13 240 lb (108.863 kg)  11/08/12 240 lb (108.863 kg)  05/26/12 240 lb (108.863 kg)    Physical Exam  Constitutional: She is oriented to person, place, and time. She appears well-developed and well-nourished. No distress.  HENT:  Head: Normocephalic and atraumatic.  Right Ear: Hearing, tympanic membrane, external ear and ear canal normal.  Left Ear: Hearing, tympanic membrane and external ear normal. A foreign body (cerumen impaction in left ear) is present.  Mouth/Throat: Oropharynx is clear and moist and mucous membranes are normal. Mucous membranes are not pale, not dry and not cyanotic. No oral lesions. No trismus in the jaw. No uvula swelling. No oropharyngeal exudate, posterior oropharyngeal edema, posterior oropharyngeal erythema or tonsillar abscesses.  I put  colace in her left EAC then irritated it with water and used an ear PIC to remove the cerumen, the exam is normal after the cerumen has been removed  Eyes: Conjunctivae are normal. Right eye exhibits no discharge. Left eye exhibits no discharge. No scleral icterus.  Neck: Normal range of motion. Neck supple. No JVD present. No tracheal deviation present. No thyromegaly present.  Cardiovascular: Normal rate, regular rhythm, normal heart sounds and intact distal pulses.  Exam reveals no gallop and no  friction rub.   No murmur heard. Pulmonary/Chest: Effort normal and breath sounds normal. No stridor. No respiratory distress. She has no wheezes. She has no rales. She exhibits no tenderness.  Abdominal: Soft. Bowel sounds are normal. She exhibits no distension and no mass. There is no tenderness. There is no rebound and no guarding.  Musculoskeletal: Normal range of motion. She exhibits no edema or tenderness.  Lymphadenopathy:    She has no cervical adenopathy.  Neurological: She is oriented to person, place, and time.  Skin: She is not diaphoretic.    Lab Results  Component Value Date   WBC 6.1 05/15/2014   HGB 11.6* 05/15/2014   HCT 34.8* 05/15/2014   PLT 332.0 05/15/2014   GLUCOSE 105* 05/15/2014   CHOL 139 05/15/2014   TRIG 97.0 05/15/2014   HDL 48.20 05/15/2014   LDLDIRECT 180.0 01/29/2010   LDLCALC 71 05/15/2014   ALT 11 05/15/2014   AST 15 05/15/2014   NA 141 05/15/2014   K 3.9 05/15/2014   CL 107 05/15/2014   CREATININE 0.71 05/15/2014   BUN 9 05/15/2014   CO2 29 05/15/2014   TSH 2.33 05/15/2014   HGBA1C 6.4 05/15/2014    No results found.  Assessment & Plan:   There are no diagnoses linked to this encounter. I have discontinued Deanna Wall's predniSONE. I am also having her maintain her aspirin, simvastatin, vitamin B-12, amLODipine, cloNIDine, furosemide, mometasone, levocetirizine, ondansetron, and nitrofurantoin.  No orders of the defined types were placed in this encounter.     Follow-up: No Follow-up on file.  Sanda Linger, MD

## 2014-09-11 NOTE — Assessment & Plan Note (Signed)
The cerumen impaction was removed

## 2014-09-11 NOTE — Patient Instructions (Signed)
Cerumen Impaction °A cerumen impaction is when the wax in your ear forms a plug. This plug usually causes reduced hearing. Sometimes it also causes an earache or dizziness. Removing a cerumen impaction can be difficult and painful. The wax sticks to the ear canal. The canal is sensitive and bleeds easily. If you try to remove a heavy wax buildup with a cotton tipped swab, you may push it in further. °Irrigation with water, suction, and small ear curettes may be used to clear out the wax. If the impaction is fixed to the skin in the ear canal, ear drops may be needed for a few days to loosen the wax. People who build up a lot of wax frequently can use ear wax removal products available in your local drugstore. °SEEK MEDICAL CARE IF:  °You develop an earache, increased hearing loss, or marked dizziness. °Document Released: 04/16/2004 Document Revised: 06/01/2011 Document Reviewed: 06/06/2009 °ExitCare® Patient Information ©2015 [employer]. This information is not intended to replace advice given to you by your health care provider. Make sure you discuss any questions you have with your health care provider. ° °

## 2014-09-11 NOTE — Assessment & Plan Note (Signed)
Her BP is well controlled 

## 2014-09-17 ENCOUNTER — Other Ambulatory Visit: Payer: Self-pay | Admitting: Internal Medicine

## 2014-10-10 ENCOUNTER — Other Ambulatory Visit: Payer: Self-pay | Admitting: Internal Medicine

## 2014-11-13 ENCOUNTER — Ambulatory Visit (INDEPENDENT_AMBULATORY_CARE_PROVIDER_SITE_OTHER): Payer: Medicare Other | Admitting: Internal Medicine

## 2014-11-13 ENCOUNTER — Ambulatory Visit: Payer: Medicare Other | Admitting: Internal Medicine

## 2014-11-13 ENCOUNTER — Other Ambulatory Visit: Payer: Medicare Other

## 2014-11-13 ENCOUNTER — Other Ambulatory Visit (INDEPENDENT_AMBULATORY_CARE_PROVIDER_SITE_OTHER): Payer: Medicare Other

## 2014-11-13 ENCOUNTER — Encounter: Payer: Self-pay | Admitting: Internal Medicine

## 2014-11-13 VITALS — BP 124/76 | HR 64 | Temp 97.4°F | Ht 67.0 in

## 2014-11-13 DIAGNOSIS — D649 Anemia, unspecified: Secondary | ICD-10-CM | POA: Diagnosis not present

## 2014-11-13 DIAGNOSIS — R7302 Impaired glucose tolerance (oral): Secondary | ICD-10-CM

## 2014-11-13 DIAGNOSIS — I10 Essential (primary) hypertension: Secondary | ICD-10-CM | POA: Diagnosis not present

## 2014-11-13 DIAGNOSIS — Z0189 Encounter for other specified special examinations: Secondary | ICD-10-CM

## 2014-11-13 DIAGNOSIS — J309 Allergic rhinitis, unspecified: Secondary | ICD-10-CM | POA: Diagnosis not present

## 2014-11-13 LAB — CBC WITH DIFFERENTIAL/PLATELET
BASOS PCT: 0.7 % (ref 0.0–3.0)
Basophils Absolute: 0 10*3/uL (ref 0.0–0.1)
EOS ABS: 0.1 10*3/uL (ref 0.0–0.7)
EOS PCT: 2.5 % (ref 0.0–5.0)
HEMATOCRIT: 34.4 % — AB (ref 36.0–46.0)
Hemoglobin: 11.6 g/dL — ABNORMAL LOW (ref 12.0–15.0)
LYMPHS PCT: 36.4 % (ref 12.0–46.0)
Lymphs Abs: 2.1 10*3/uL (ref 0.7–4.0)
MCHC: 33.6 g/dL (ref 30.0–36.0)
MCV: 84 fl (ref 78.0–100.0)
MONO ABS: 0.4 10*3/uL (ref 0.1–1.0)
Monocytes Relative: 6.9 % (ref 3.0–12.0)
NEUTROS ABS: 3 10*3/uL (ref 1.4–7.7)
Neutrophils Relative %: 53.5 % (ref 43.0–77.0)
PLATELETS: 316 10*3/uL (ref 150.0–400.0)
RBC: 4.09 Mil/uL (ref 3.87–5.11)
RDW: 14.7 % (ref 11.5–15.5)
WBC: 5.7 10*3/uL (ref 4.0–10.5)

## 2014-11-13 NOTE — Progress Notes (Signed)
Pre visit review using our clinic review tool, if applicable. No additional management support is needed unless otherwise documented below in the visit note. 

## 2014-11-13 NOTE — Assessment & Plan Note (Signed)
Asypm[facility], stable overall by history and exam, recent data reviewed with pt, and pt to continue medical treatment as before,  to f/u any worsening symptoms or concerns Lab Results  Component Value Date   HGBA1C 6.4 05/15/2014   For fu lab next visit

## 2014-11-13 NOTE — Assessment & Plan Note (Signed)
Mild, likely of chronic dz, for f'/u labs today

## 2014-11-13 NOTE — Assessment & Plan Note (Signed)
stable overall by history and exam, recent data reviewed with pt, and pt to continue medical treatment as before,  to f/u any worsening symptoms or concerns BP Readings from Last 3 Encounters:  11/13/14 124/76  09/10/14 138/70  06/05/14 136/88

## 2014-11-13 NOTE — Patient Instructions (Signed)

## 2014-11-13 NOTE — Progress Notes (Signed)
Subjective:    Patient ID: Deanna Wall, female    DOB: 07-26-1931, 81 y.o.   MRN: [id number]  HPI  Here to f/u; overall doing ok,  Pt denies chest pain, increasing sob or doe, wheezing, orthopnea, PND, increased LE swelling (but does have ongoing dependent edema chronic), palpitations, dizziness or syncope.  Pt denies new neurological symptoms such as new headache, or facial or extremity weakness or numbness.  Pt denies polydipsia, polyuria, or low sugar episode.   Pt denies new neurological symptoms such as new headache, or facial or extremity weakness or numbness.   Pt states overall good compliance with meds, mostly trying to follow appropriate diet,  Does have several wks ongoing nasal allergy symptoms with clearish congestion, itch and sneezing, without fever, pain, ST, cough, swelling or wheezing, but does not like to use the nasonex. Past Medical History  Diagnosis Date  . Multiple sclerosis 10/28/2006  . OSTEOARTHRITIS, KNEES, BILATERAL, SEVERE 06/14/2009  . HYPERLIPIDEMIA 02/16/2010  . ANEMIA, PERNICIOUS 01/30/2009  . HYPERTENSION 10/28/2006  . VITAMIN B12 DEFICIENCY 06/14/2009  . ECZEMA 10/28/2006  . DEGENERATIVE DISC DISEASE 10/28/2006  . Impaired glucose tolerance 12/31/2010  . Sebaceous cyst     chest   . MS (multiple sclerosis)   . Gait disorder   . Obesity    Past Surgical History  Procedure Laterality Date  . Tubal ligation  1958  . Upper gastrointestinal endoscopy    . Colonoscopy      reports that she has never smoked. She has never used smokeless tobacco. She reports that she does not drink alcohol or use illicit drugs. family history includes Arthritis in her mother and sister; Bone cancer in her mother; Cancer in her mother; Diabetes in her sister. Allergies  Allergen Reactions  . Atorvastatin     REACTION: feels funny in face and head   Current Outpatient Prescriptions on File Prior to Visit  Medication Sig Dispense Refill  . amLODipine (NORVASC) 5 MG tablet Take  1 tablet (5 mg total) by mouth daily. 90 tablet 3  . aspirin 81 MG tablet Take 81 mg by mouth daily.      . cloNIDine (CATAPRES) 0.3 MG tablet TAKE 1 TABLET TWICE DAILY 180 tablet 0  . furosemide (LASIX) 20 MG tablet TAKE 1 TABLET (20 MG TOTAL) BY MOUTH DAILY AS NEEDED FOR SWELLING 90 tablet 3  . levocetirizine (XYZAL) 5 MG tablet Take 1 tablet (5 mg total) by mouth every evening. 30 tablet 11  . mometasone (NASONEX) 50 MCG/ACT nasal spray Place 2 sprays into the nose daily. 17 g 12  . nitrofurantoin (MACRODANTIN) 50 MG capsule Take 1 capsule (50 mg total) by mouth daily. 90 capsule 1  . simvastatin (ZOCOR) 40 MG tablet Take 40 mg by mouth every evening.    . vitamin B-12 (CYANOCOBALAMIN) 500 MCG tablet Take 500 mcg by mouth daily.    . ondansetron (ZOFRAN) 4 MG tablet Take 1 tablet (4 mg total) by mouth every 8 (eight) hours as needed for nausea or vomiting. (Patient not taking: Reported on 11/13/2014) 25 tablet 1   No current facility-administered medications on file prior to visit.   Review of Systems  Constitutional: Negative for unusual diaphoresis or night sweats HENT: Negative for ringing in ear or discharge Eyes: Negative for double vision or worsening visual disturbance.  Respiratory: Negative for choking and stridor.   Gastrointestinal: Negative for vomiting or other signifcant bowel change Genitourinary: Negative for hematuria or change in urine volume.  Musculoskeletal: Negative for other MSK pain or swelling Skin: Negative for color change and worsening wound.  Neurological: Negative for tremors and numbness other than noted  Psychiatric/Behavioral: Negative for decreased concentration or agitation other than above       Objective:   Physical Exam BP 124/76 mmHg  Pulse 64  Temp(Src) 97.4 F (36.3 C) (Oral)  Ht  (1.702 m)  SpO2 96% VS noted,  Constitutional: Pt appears in no significant distress HENT: Head: NCAT.  Right Ear: External ear normal.  Left Ear:  External ear normal.  Eyes: . Pupils are equal, round, and reactive to light. Conjunctivae and EOM are normal Neck: Normal range of motion. Neck supple.  Cardiovascular: Normal rate and regular rhythm.   Pulmonary/Chest: Effort normal and breath sounds without rales or wheezing.  Abd:  Soft, NT, ND, + BS Neurological: Pt is alert. Not confused , motor grossly intact Skin: Skin is warm. No rash, 1+bilat ankle edema Psychiatric: Pt behavior is normal. No agitation.     Assessment & Plan:

## 2014-11-13 NOTE — Assessment & Plan Note (Signed)
Ok for [employer] claritin, +/- delsym prn cough due to post nasal gtt

## 2014-11-14 ENCOUNTER — Encounter: Payer: Self-pay | Admitting: Internal Medicine

## 2014-12-25 ENCOUNTER — Ambulatory Visit (INDEPENDENT_AMBULATORY_CARE_PROVIDER_SITE_OTHER): Payer: Medicare Other

## 2014-12-25 DIAGNOSIS — Z23 Encounter for immunization: Secondary | ICD-10-CM | POA: Diagnosis not present

## 2014-12-27 ENCOUNTER — Encounter: Payer: Self-pay | Admitting: Nurse Practitioner

## 2014-12-27 ENCOUNTER — Ambulatory Visit (INDEPENDENT_AMBULATORY_CARE_PROVIDER_SITE_OTHER): Payer: Medicare Other | Admitting: Nurse Practitioner

## 2014-12-27 ENCOUNTER — Other Ambulatory Visit: Payer: Self-pay | Admitting: *Deleted

## 2014-12-27 VITALS — BP 147/68 | HR 54 | Ht 67.0 in | Wt 256.0 lb

## 2014-12-27 DIAGNOSIS — G35 Multiple sclerosis: Secondary | ICD-10-CM

## 2014-12-27 NOTE — Progress Notes (Signed)
[facility]  PATIENT: Deanna Wall DOB: Jul 18, 1931  REASON FOR VISIT: Follow-up for multiple sclerosis primary progressive, abnormality of gait, paresthesias HISTORY FROM: Patient and husband    HISTORY OF PRESENT ILLNESS:Deanna Wall, 81 year old female returns for followup. She was last seen in this office 12/26/2013.  Original diagnosis of MS in 1984 and she has never been on any medications for multiple sclerosis. She continues to walk short distances in her home with a walker, she is in a wheelchair today. She has not fallen in the last year. She feels she has gotten weaker in the last year. She continues to have  burning sensation in both legs up to the knees intermittently. She denies any pain in the low back or in the extremities.  She did not have further workup such as EMG or nerve conduction/MRI of the lumbosacral spine. She continues to be independent with dressing and bathing however it takes a long time. She also does some occasional cooking. She lives with her husband .She returns for reevaluation.    HISTORY: Deanna Wall is a 81 year old right-handed black female with a history of multiple sclerosis that was diagnosed in 1984 by Dr. Maximino Greenland. The patient has had a chronic progressive MS course, and she has developed a paraparesis associated with this. The patient has never been on any medications for multiple sclerosis. The patient indicates that she has lost her ability to ambulate independently 3 or 4 years ago, and she mainly uses a wheelchair or a rolling chair in the house. The patient is able to walk short distances with her walker. The patient has not had any recent falls. Approximately 6 weeks ago, the patient indicates that she developed a fairly sudden onset of burning sensations in both legs up to the knees. This lasted about 4 weeks, and it has resolved completely at this point. The patient denied any change in strength or ability to bear weight on  her legs. The patient has chronic constipation, and some urinary frequency and frequent urinary tract infections. The patient has degenerative arthritis involving the hands and the legs. The patient has not had any changes in vision. The patient was last seen in June of 2013 by Dr. Terrace Arabia. In the past, the patient has had mild elevations in CK enzyme levels, and NMO antibodies were negative. MRI evaluation has shown evidence of atrophy of the spinal cord, without discrete lesions. The patient denies neck or low back discomfort, or pain down the lower extremities.   REVIEW OF SYSTEMS: Full 14 system review of systems performed and notable only for those listed, all others are neg:  Constitutional: neg  Cardiovascular: Leg swelling Ear/Nose/Throat: neg  Skin: neg Eyes: neg Respiratory: neg Gastroitestinal: neg  Hematology/Lymphatic: neg  Endocrine: neg Musculoskeletal: Joint pain, walking difficulty Allergy/Immunology: neg Neurological: neg Psychiatric: neg Sleep : neg   ALLERGIES: Allergies  Allergen Reactions  . Atorvastatin     REACTION: feels funny in face and head    HOME MEDICATIONS: Outpatient Prescriptions Prior to Visit  Medication Sig Dispense Refill  . amLODipine (NORVASC) 5 MG tablet Take 1 tablet (5 mg total) by mouth daily. 90 tablet 3  . aspirin 81 MG tablet Take 81 mg by mouth daily.      . cloNIDine (CATAPRES) 0.3 MG tablet TAKE 1 TABLET TWICE DAILY 180 tablet 0  . furosemide (LASIX) 20 MG tablet TAKE 1 TABLET (20 MG TOTAL) BY MOUTH DAILY AS NEEDED FOR SWELLING 90 tablet 3  .  latanoprost (XALATAN) 0.005 % ophthalmic solution INSTILL 1 DROP IN BOTH EYES AT BEDTIME. KEEP THE BOTTLE IN THE FRIDGE UNTIL YOU OPEN IT.  3  . levocetirizine (XYZAL) 5 MG tablet Take 1 tablet (5 mg total) by mouth every evening. 30 tablet 11  . mometasone (NASONEX) 50 MCG/ACT nasal spray Place 2 sprays into the nose daily. 17 g 12  . nitrofurantoin (MACRODANTIN) 50 MG capsule Take 1 capsule (50  mg total) by mouth daily. 90 capsule 1  . ondansetron (ZOFRAN) 4 MG tablet Take 1 tablet (4 mg total) by mouth every 8 (eight) hours as needed for nausea or vomiting. 25 tablet 1  . simvastatin (ZOCOR) 40 MG tablet Take 40 mg by mouth every evening.    . vitamin B-12 (CYANOCOBALAMIN) 500 MCG tablet Take 500 mcg by mouth daily.     No facility-administered medications prior to visit.    PAST MEDICAL HISTORY: Past Medical History  Diagnosis Date  . Multiple sclerosis (HCC) 10/28/2006  . OSTEOARTHRITIS, KNEES, BILATERAL, SEVERE 06/14/2009  . HYPERLIPIDEMIA 02/16/2010  . ANEMIA, PERNICIOUS 01/30/2009  . HYPERTENSION 10/28/2006  . VITAMIN B12 DEFICIENCY 06/14/2009  . ECZEMA 10/28/2006  . DEGENERATIVE DISC DISEASE 10/28/2006  . Impaired glucose tolerance 12/31/2010  . Sebaceous cyst     chest   . MS (multiple sclerosis) (HCC)   . Gait disorder   . Obesity   . Cataracts, bilateral   . Glaucoma     PAST SURGICAL HISTORY: Past Surgical History  Procedure Laterality Date  . Tubal ligation  1958  . Upper gastrointestinal endoscopy    . Colonoscopy      FAMILY HISTORY: Family History  Problem Relation Age of Onset  . Bone cancer Mother   . Arthritis Mother   . Cancer Mother     bone  . Diabetes Sister   . Arthritis Sister     SOCIAL HISTORY: Social History   Social History  . Marital Status: Married    Spouse Name: N/A  . Number of Children: 5  . Years of Education: 11TH    Occupational History  . homemaker    Social History Main Topics  . Smoking status: Never Smoker   . Smokeless tobacco: Never Used  . Alcohol Use: No  . Drug Use: No  . Sexual Activity: Not on file   Other Topics Concern  . Not on file   Social History Narrative     PHYSICAL EXAM  Filed Vitals:   12/27/14 0920  BP: 147/68  Pulse: 54  Height:  (1.702 m)  Weight: 256 lb (116.121 kg)   Body mass index is 40.09 kg/(m^2). General: The patient is alert and cooperative at the time of the  examination. The patient is moderately obese.  Head: Normocephalic and atraumatic. Oropharynx benign Neck: The neck is supple, no carotid bruits are noted.  Respiratory: The respiratory examination is clear.  Cardiovascular: The cardiovascular examination reveals a regular rate and rhythm, no obvious murmurs or rubs are noted.  Skin: Extremities are with 1+ edema below the knees bilaterally.  Neurologic Exam  Mental status:  Cranial nerves: Pupils are equal, round, and reactive to light. Discs are flat bilaterally. Extraocular movements are full. Visual fields are full.Facial symmetry is present. There is good sensation of the face to pinprick and soft touch bilaterally. The strength of the facial muscles and the muscles to head turning and shoulder shrug are normal bilaterally. Speech is well enunciated, no aphasia or dysarthria is noted.  Motor: The motor testing reveals 5 over 5 strength of the upper extremities. With the lower extremities, there is 3/5 strength proximally in the legs, 4/5 distal strength is seen. Good symmetric motor tone is noted throughout.  Sensory: Sensory testing is intact to pinprick, soft touch, vibration sensation, and position sense on the upper extremities. With the lower extremities, no definite stocking pattern pinprick sensory deficit is noted. There is a decrease in pinprick sensation on the left leg as compared to the right. Vibration sensation is impaired significantly in both legs well above the knees. Position sense is impaired in both feet, right greater than left. No evidence of extinction is noted.  Coordination: Cerebellar testing reveals good finger-nose-finger and heel-to-shin bilaterally.  Gait and station: The patient is wheelchair-bound. She was not ambulated as she did not bring her walker Reflexes: Deep tendon reflexes are symmetric, but are depressed bilaterally. Toes are downgoing bilaterally.    DIAGNOSTIC DATA (LABS, IMAGING,  TESTING) - I reviewed patient records, labs, notes, testing and imaging myself where available.  Lab Results  Component Value Date   WBC 5.7 11/13/2014   HGB 11.6* 11/13/2014   HCT 34.4* 11/13/2014   MCV 84.0 11/13/2014   PLT 316.0 11/13/2014      Component Value Date/Time   NA 141 05/15/2014 1019   K 3.9 05/15/2014 1019   CL 107 05/15/2014 1019   CO2 29 05/15/2014 1019   GLUCOSE 105* 05/15/2014 1019   BUN 9 05/15/2014 1019   CREATININE 0.71 05/15/2014 1019   CALCIUM 8.8 05/15/2014 1019   PROT 7.1 05/15/2014 1019   ALBUMIN 3.7 05/15/2014 1019   AST 15 05/15/2014 1019   ALT 11 05/15/2014 1019   ALKPHOS 80 05/15/2014 1019   BILITOT 0.6 05/15/2014 1019   GFRNONAA 80* 04/14/2011 1120   GFRAA >90 04/14/2011 1120   Lab Results  Component Value Date   CHOL 139 05/15/2014   HDL 48.20 05/15/2014   LDLCALC 71 05/15/2014   LDLDIRECT 180.0 01/29/2010   TRIG 97.0 05/15/2014   CHOLHDL 3 05/15/2014   Lab Results  Component Value Date   HGBA1C 6.4 05/15/2014    Lab Results  Component Value Date   TSH 2.33 05/15/2014      ASSESSMENT AND PLAN  81 y.o. year old female  has a past medical history of Multiple sclerosis (HCC) (10/28/2006); OSTEOARTHRITIS, KNEES, BILATERAL, SEVERE (06/14/2009); ;   Gait disorder; and paresthesias  here to follow-up.  Will set up for home PT to evaluate and treat gait abnormality lower extremity weakness and decreased mobility [facility]   Continue to use walker at all times at risk for falls F/U in 3 to 4 months to evaluate the effectiveness of therapy Nilda Riggs, [facility], [facility], APRN  [facility] [address], [address] [place], [place] [address] [phone]

## 2014-12-27 NOTE — Patient Instructions (Addendum)
Will set up for home PT [facility] due to decreased mobility  Continue to use walker at all times at risk for falls F/U in 3 to 4 months to evaluate the effectiveness of therapy

## 2014-12-27 NOTE — Progress Notes (Signed)
Order placed for [facility] PT per CM/NP.

## 2014-12-27 NOTE — Progress Notes (Signed)
I have read the note, and I agree with the clinical assessment and plan.  Ramani Riva KEITH   

## 2014-12-31 ENCOUNTER — Telehealth: Payer: Self-pay | Admitting: Neurology

## 2014-12-31 NOTE — Telephone Encounter (Signed)
Deanna Wall with [facility] called requesting verbal orders for PT for 2 x wk for 1wk then 3x wk for 3 weeks and then 2x wk for 2wks. Please call and advise at [phone]

## 2014-12-31 NOTE — Telephone Encounter (Signed)
I called [facility] and left a voicemail stating that the proposed PT schedule is okay.

## 2015-01-08 ENCOUNTER — Other Ambulatory Visit: Payer: Self-pay | Admitting: Internal Medicine

## 2015-02-07 ENCOUNTER — Other Ambulatory Visit: Payer: Self-pay | Admitting: Internal Medicine

## 2015-04-10 ENCOUNTER — Encounter: Payer: Self-pay | Admitting: Nurse Practitioner

## 2015-04-10 ENCOUNTER — Ambulatory Visit (INDEPENDENT_AMBULATORY_CARE_PROVIDER_SITE_OTHER): Payer: Medicare Other | Admitting: Nurse Practitioner

## 2015-04-10 VITALS — BP 106/59 | HR 47 | Ht 67.0 in

## 2015-04-10 DIAGNOSIS — G35 Multiple sclerosis: Secondary | ICD-10-CM | POA: Diagnosis not present

## 2015-04-10 DIAGNOSIS — R269 Unspecified abnormalities of gait and mobility: Secondary | ICD-10-CM

## 2015-04-10 NOTE — Patient Instructions (Signed)
Continue to use her walker at all times at home for safe ambulation Continue to do home exercise program provided by physical therapy Follow-up in one year or sooner if problems arise Next visit with Dr. Anne Hahn

## 2015-04-10 NOTE — Progress Notes (Signed)
[facility]  PATIENT: Deanna Wall DOB: [birth year]   REASON FOR VISIT: Follow-up for multiple sclerosis primary progressive, abnormality of gait, paresthesias HISTORY FROM: Patient and husband  HISTORY OF PRESENT ILLNESS:Deanna Wall, 82 year old female returns for followup. She was last seen in this office 12/27/2014. She was complaining of increased problems with gait and lower extremity weakness when last seen. In-home physical therapy was ordered which she claims was very beneficial. She is now utilizing her home exercise program. Original diagnosis of MS in 1984 and she has never been on any medications for multiple sclerosis. She continues to walk short distances in her home with a walker, she is in a wheelchair today. She has not fallen in the last year. She feels stronger since her physical therapy  She continues to have burning sensation intermittently in both legs up to the knees intermittently. She denies any pain in the low back or in the extremities. She did not have further workup such as EMG or nerve conduction/MRI of the lumbosacral spine. She continues to be independent with dressing and bathing. She also cooks.  She lives with her husband .She returns for reevaluation.    HISTORY: Deanna Wall is a 82 year old right-handed black female with a history of multiple sclerosis that was diagnosed in 1984 by Dr. Maximino Greenland. The patient has had a chronic progressive MS course, and she has developed a paraparesis associated with this. The patient has never been on any medications for multiple sclerosis. The patient indicates that she has lost her ability to ambulate independently 3 or 4 years ago, and she mainly uses a wheelchair or a rolling chair in the house. The patient is able to walk short distances with her walker. The patient has not had any recent falls. Approximately 6 weeks ago, the patient indicates that she developed a fairly sudden onset of burning  sensations in both legs up to the knees. This lasted about 4 weeks, and it has resolved completely at this point. The patient denied any change in strength or ability to bear weight on her legs. The patient has chronic constipation, and some urinary frequency and frequent urinary tract infections. The patient has degenerative arthritis involving the hands and the legs. The patient has not had any changes in vision. The patient was last seen in June of 2013 by Dr. Terrace Arabia. In the past, the patient has had mild elevations in CK enzyme levels, and NMO antibodies were negative. MRI evaluation has shown evidence of atrophy of the spinal cord, without discrete lesions. The patient denies neck or low back discomfort, or pain down the lower extremities.     REVIEW OF SYSTEMS: Full 14 system review of systems performed and notable only for those listed, all others are neg:  Constitutional: neg  Cardiovascular: neg Ear/Nose/Throat: neg  Skin: neg Eyes: neg Respiratory: neg Gastroitestinal: neg  Hematology/Lymphatic: neg  Endocrine: neg Musculoskeletal: Walking difficulty Allergy/Immunology: neg Neurological: neg Psychiatric: neg Sleep : neg   ALLERGIES: Allergies  Allergen Reactions  . Atorvastatin     REACTION: feels funny in face and head    HOME MEDICATIONS: Outpatient Prescriptions Prior to Visit  Medication Sig Dispense Refill  . amLODipine (NORVASC) 5 MG tablet Take 1 tablet (5 mg total) by mouth daily. 90 tablet 3  . aspirin 81 MG tablet Take 81 mg by mouth daily.      . cloNIDine (CATAPRES) 0.3 MG tablet TAKE 1 TABLET TWICE DAILY 180 tablet 1  . furosemide (LASIX) 20  MG tablet TAKE 1 TABLET (20 MG TOTAL) BY MOUTH DAILY AS NEEDED FOR SWELLING 90 tablet 3  . latanoprost (XALATAN) 0.005 % ophthalmic solution INSTILL 1 DROP IN BOTH EYES AT BEDTIME. KEEP THE BOTTLE IN THE FRIDGE UNTIL YOU OPEN IT.  3  . levocetirizine (XYZAL) 5 MG tablet Take 1 tablet (5 mg total) by mouth every evening. 30  tablet 11  . mometasone (NASONEX) 50 MCG/ACT nasal spray Place 2 sprays into the nose daily. 17 g 12  . nitrofurantoin (MACRODANTIN) 50 MG capsule Take 1 capsule (50 mg total) by mouth daily. 90 capsule 1  . ondansetron (ZOFRAN) 4 MG tablet Take 1 tablet (4 mg total) by mouth every 8 (eight) hours as needed for nausea or vomiting. 25 tablet 1  . simvastatin (ZOCOR) 40 MG tablet Take 40 mg by mouth every evening.    . simvastatin (ZOCOR) 40 MG tablet TAKE 1 TABLET BY MOUTH DAILY. 90 tablet 1  . vitamin B-12 (CYANOCOBALAMIN) 500 MCG tablet Take 500 mcg by mouth daily.     No facility-administered medications prior to visit.    PAST MEDICAL HISTORY: Past Medical History  Diagnosis Date  . Multiple sclerosis (HCC) 10/28/2006  . OSTEOARTHRITIS, KNEES, BILATERAL, SEVERE 06/14/2009  . HYPERLIPIDEMIA 02/16/2010  . ANEMIA, PERNICIOUS 01/30/2009  . HYPERTENSION 10/28/2006  . VITAMIN B12 DEFICIENCY 06/14/2009  . ECZEMA 10/28/2006  . DEGENERATIVE DISC DISEASE 10/28/2006  . Impaired glucose tolerance 12/31/2010  . Sebaceous cyst     chest   . MS (multiple sclerosis) (HCC)   . Gait disorder   . Obesity   . Cataracts, bilateral   . Glaucoma     PAST SURGICAL HISTORY: Past Surgical History  Procedure Laterality Date  . Tubal ligation  1958  . Upper gastrointestinal endoscopy    . Colonoscopy      FAMILY HISTORY: Family History  Problem Relation Age of Onset  . Bone cancer Mother   . Arthritis Mother   . Cancer Mother     bone  . Diabetes Sister   . Arthritis Sister     SOCIAL HISTORY: Social History   Social History  . Marital Status: Married    Spouse Name: N/A  . Number of Children: 5  . Years of Education: 11TH    Occupational History  . homemaker    Social History Main Topics  . Smoking status: Never Smoker   . Smokeless tobacco: Never Used  . Alcohol Use: No  . Drug Use: No  . Sexual Activity: Not on file   Other Topics Concern  . Not on file   Social History  Narrative     PHYSICAL EXAM  Filed Vitals:   04/10/15 0918  Height:  (1.702 m)   There is no weight on file to calculate BMI. General: The patient is alert and cooperative at the time of the examination. The patient is moderately obese.  Head: Normocephalic and atraumatic. Oropharynx benign Neck: The neck is supple, no carotid bruits are noted.  Respiratory: The respiratory examination is clear.  Cardiovascular: The cardiovascular examination reveals a regular rate and rhythm, no obvious murmurs or rubs are noted.  Skin: Extremities are with 1+ edema below the knees bilaterally.  Neurologic Exam  Mental status:  Cranial nerves: Pupils are equal, round, and reactive to light. Discs are flat bilaterally. Extraocular movements are full. Visual fields are full.Facial symmetry is present. There is good sensation of the face to pinprick and soft touch bilaterally. The strength  of the facial muscles and the muscles to head turning and shoulder shrug are normal bilaterally. Speech is well enunciated, no aphasia or dysarthria is noted.  Motor: The motor testing reveals 5 over 5 strength of the upper extremities. With the lower extremities, there is 4/5 strength proximally and distally in the right leg, 5/5 in the left lower extremity . Good symmetric motor tone is noted throughout.  Sensory: Sensory testing is intact to pinprick, soft touch, vibration sensation, and position sense on the upper extremities. With the lower extremities, no definite stocking pattern pinprick sensory deficit is noted. There is a decrease in pinprick sensation on the left leg as compared to the right. Vibration sensation is impaired significantly in both legs. Position sense is impaired in both feet, right greater than left. No evidence of extinction is noted.  Coordination: Cerebellar testing reveals good finger-nose-finger and heel-to-shin bilaterally.  Gait and station: The patient is wheelchair-bound.  She was not ambulated as she did not bring her walker Reflexes: Deep tendon reflexes are symmetric, but are depressed bilaterally. Toes are downgoing bilaterally.    DIAGNOSTIC DATA (LABS, IMAGING, TESTING) - I reviewed patient records, labs, notes, testing and imaging myself where available.  Lab Results  Component Value Date   WBC 5.7 11/13/2014   HGB 11.6* 11/13/2014   HCT 34.4* 11/13/2014   MCV 84.0 11/13/2014   PLT 316.0 11/13/2014      Component Value Date/Time   NA 141 05/15/2014 1019   K 3.9 05/15/2014 1019   CL 107 05/15/2014 1019   CO2 29 05/15/2014 1019   GLUCOSE 105* 05/15/2014 1019   BUN 9 05/15/2014 1019   CREATININE 0.71 05/15/2014 1019   CALCIUM 8.8 05/15/2014 1019   PROT 7.1 05/15/2014 1019   ALBUMIN 3.7 05/15/2014 1019   AST 15 05/15/2014 1019   ALT 11 05/15/2014 1019   ALKPHOS 80 05/15/2014 1019   BILITOT 0.6 05/15/2014 1019   GFRNONAA 80* 04/14/2011 1120   GFRAA >90 04/14/2011 1120   Lab Results  Component Value Date   CHOL 139 05/15/2014   HDL 48.20 05/15/2014   LDLCALC 71 05/15/2014   LDLDIRECT 180.0 01/29/2010   TRIG 97.0 05/15/2014   CHOLHDL 3 05/15/2014   Lab Results  Component Value Date   HGBA1C 6.4 05/15/2014    Lab Results  Component Value Date   TSH 2.33 05/15/2014      ASSESSMENT AND PLAN  82 y.o. year old female  has a past medical history of Multiple sclerosis (HCC)r; gait disorder and paresthesias here to follow-up.   PLAN Continue to use her walker at all times at home for safe ambulation Continue to do home exercise program provided by physical therapy Follow-up in one year or sooner if problems arise Next visit with Dr. Woody Seller, [facility], [facility], APRN  [facility] [address], [address] [place], [place] [address] [phone]

## 2015-04-10 NOTE — Progress Notes (Signed)
I have read the note, and I agree with the clinical assessment and plan.  Kimberlie Csaszar KEITH   

## 2015-05-14 ENCOUNTER — Other Ambulatory Visit (INDEPENDENT_AMBULATORY_CARE_PROVIDER_SITE_OTHER): Payer: Medicare Other

## 2015-05-14 DIAGNOSIS — Z0189 Encounter for other specified special examinations: Secondary | ICD-10-CM | POA: Diagnosis not present

## 2015-05-14 DIAGNOSIS — R7302 Impaired glucose tolerance (oral): Secondary | ICD-10-CM

## 2015-05-14 DIAGNOSIS — Z0000 Encounter for general adult medical examination without abnormal findings: Secondary | ICD-10-CM

## 2015-05-14 DIAGNOSIS — H25013 Cortical age-related cataract, bilateral: Secondary | ICD-10-CM | POA: Diagnosis not present

## 2015-05-14 DIAGNOSIS — H18413 Arcus senilis, bilateral: Secondary | ICD-10-CM | POA: Diagnosis not present

## 2015-05-14 DIAGNOSIS — H11153 Pinguecula, bilateral: Secondary | ICD-10-CM | POA: Diagnosis not present

## 2015-05-14 DIAGNOSIS — H11423 Conjunctival edema, bilateral: Secondary | ICD-10-CM | POA: Diagnosis not present

## 2015-05-14 DIAGNOSIS — H2513 Age-related nuclear cataract, bilateral: Secondary | ICD-10-CM | POA: Diagnosis not present

## 2015-05-14 DIAGNOSIS — H401131 Primary open-angle glaucoma, bilateral, mild stage: Secondary | ICD-10-CM | POA: Diagnosis not present

## 2015-05-14 LAB — URINALYSIS, ROUTINE W REFLEX MICROSCOPIC
Bilirubin Urine: NEGATIVE
Hgb urine dipstick: NEGATIVE
Ketones, ur: NEGATIVE
Nitrite: NEGATIVE
PH: 5.5 (ref 5.0–8.0)
RBC / HPF: NONE SEEN (ref 0–?)
TOTAL PROTEIN, URINE-UPE24: NEGATIVE
UROBILINOGEN UA: 0.2 (ref 0.0–1.0)
Urine Glucose: NEGATIVE

## 2015-05-14 LAB — LIPID PANEL
CHOL/HDL RATIO: 3
Cholesterol: 148 mg/dL (ref 0–200)
HDL: 50.9 mg/dL (ref >39.00)
LDL Cholesterol: 80 mg/dL (ref 0–99)
NONHDL: 97.42
Triglycerides: 85 mg/dL (ref 0.0–149.0)
VLDL: 17 mg/dL (ref 0.0–40.0)

## 2015-05-14 LAB — BASIC METABOLIC PANEL
BUN: 14 mg/dL (ref 6–23)
CALCIUM: 8.9 mg/dL (ref 8.4–10.5)
CHLORIDE: 107 meq/L (ref 96–112)
CO2: 30 mEq/L (ref 19–32)
CREATININE: 0.85 mg/dL (ref 0.40–1.20)
GFR: 82.46 mL/min (ref >60.00)
Glucose, Bld: 107 mg/dL — ABNORMAL HIGH (ref 70–99)
Potassium: 4.1 mEq/L (ref 3.5–5.1)
Sodium: 142 mEq/L (ref 135–145)

## 2015-05-14 LAB — CBC WITH DIFFERENTIAL/PLATELET
BASOS PCT: 0.4 % (ref 0.0–3.0)
Basophils Absolute: 0 10*3/uL (ref 0.0–0.1)
EOS PCT: 1.8 % (ref 0.0–5.0)
Eosinophils Absolute: 0.1 10*3/uL (ref 0.0–0.7)
HEMATOCRIT: 34.4 % — AB (ref 36.0–46.0)
Hemoglobin: 11.5 g/dL — ABNORMAL LOW (ref 12.0–15.0)
LYMPHS PCT: 34.9 % (ref 12.0–46.0)
Lymphs Abs: 2.1 10*3/uL (ref 0.7–4.0)
MCHC: 33.5 g/dL (ref 30.0–36.0)
MCV: 84.1 fl (ref 78.0–100.0)
MONOS PCT: 8.2 % (ref 3.0–12.0)
Monocytes Absolute: 0.5 10*3/uL (ref 0.1–1.0)
NEUTROS ABS: 3.2 10*3/uL (ref 1.4–7.7)
Neutrophils Relative %: 54.7 % (ref 43.0–77.0)
PLATELETS: 319 10*3/uL (ref 150.0–400.0)
RBC: 4.1 Mil/uL (ref 3.87–5.11)
RDW: 15.1 % (ref 11.5–15.5)
WBC: 5.9 10*3/uL (ref 4.0–10.5)

## 2015-05-14 LAB — HEPATIC FUNCTION PANEL
ALT: 10 U/L (ref 0–35)
AST: 15 U/L (ref 0–37)
Albumin: 3.7 g/dL (ref 3.5–5.2)
Alkaline Phosphatase: 71 U/L (ref 39–117)
BILIRUBIN DIRECT: 0.2 mg/dL (ref 0.0–0.3)
BILIRUBIN TOTAL: 0.7 mg/dL (ref 0.2–1.2)
Total Protein: 7 g/dL (ref 6.0–8.3)

## 2015-05-14 LAB — TSH: TSH: 2.49 u[IU]/mL (ref 0.35–4.50)

## 2015-05-14 LAB — HEMOGLOBIN A1C: Hgb A1c MFr Bld: 6.3 % (ref 4.6–6.5)

## 2015-05-16 ENCOUNTER — Encounter: Payer: Self-pay | Admitting: Internal Medicine

## 2015-05-16 ENCOUNTER — Ambulatory Visit (INDEPENDENT_AMBULATORY_CARE_PROVIDER_SITE_OTHER): Payer: Medicare Other | Admitting: Internal Medicine

## 2015-05-16 VITALS — BP 118/80 | HR 47 | Temp 97.9°F | Resp 20 | Wt 250.0 lb

## 2015-05-16 DIAGNOSIS — I10 Essential (primary) hypertension: Secondary | ICD-10-CM | POA: Diagnosis not present

## 2015-05-16 DIAGNOSIS — Z0000 Encounter for general adult medical examination without abnormal findings: Secondary | ICD-10-CM

## 2015-05-16 DIAGNOSIS — E785 Hyperlipidemia, unspecified: Secondary | ICD-10-CM

## 2015-05-16 DIAGNOSIS — R7302 Impaired glucose tolerance (oral): Secondary | ICD-10-CM

## 2015-05-16 MED ORDER — SIMVASTATIN 40 MG PO TABS: 40.0000 mg | ORAL_TABLET | Freq: Every evening | ORAL | Status: DC

## 2015-05-16 MED ORDER — LEVOCETIRIZINE DIHYDROCHLORIDE 5 MG PO TABS: 5.0000 mg | ORAL_TABLET | Freq: Every evening | ORAL | Status: DC

## 2015-05-16 NOTE — Progress Notes (Signed)
Pre visit review using our clinic review tool, if applicable. No additional management support is needed unless otherwise documented below in the visit note. 

## 2015-05-16 NOTE — Assessment & Plan Note (Signed)
stable overall by history and exam, recent data reviewed with pt, and pt to continue medical treatment as before,  to f/u any worsening symptoms or concerns Lab Results  Component Value Date   LDLCALC 80 05/14/2015    

## 2015-05-16 NOTE — Assessment & Plan Note (Signed)

## 2015-05-16 NOTE — Assessment & Plan Note (Signed)
stable overall by history and exam, recent data reviewed with pt, and pt to continue medical treatment as before,  to f/u any worsening symptoms or concerns BP Readings from Last 3 Encounters:  05/16/15 118/80  04/10/15 106/59  12/27/14 147/68

## 2015-05-16 NOTE — Progress Notes (Signed)
Subjective:    Patient ID: Deanna Wall, female    DOB: [birth year], 82 y.o.   MRN: [id number]  HPI  Here for wellness and f/u;  Overall doing ok;  Pt denies Chest pain, worsening SOB, DOE, wheezing, orthopnea, PND, worsening LE edema, palpitations, dizziness or syncope.  Pt denies neurological change such as new headache, facial or extremity weakness.  Pt denies polydipsia, polyuria, or low sugar symptoms. Pt states overall good compliance with treatment and medications, good tolerability, and has been trying to follow appropriate diet.  Pt denies worsening depressive symptoms, suicidal ideation or panic. No fever, night sweats, wt loss, loss of appetite, or other constitutional symptoms.  Pt states good ability with ADL's, has low fall risk, home safety reviewed and adequate, no other significant changes in hearing or vision, remains wheelchair bound, but no falls with transfers. Husband is 89 but still able to help. Has mild pyuria feb 21 labs - Denies urinary symptoms such as dysuria, frequency, urgency, flank pain, hematuria or n/v, fever, chills. Due for cataract surgury  bilat in April 2017.   Past Medical History  Diagnosis Date  . Multiple sclerosis (HCC) 10/28/2006  . OSTEOARTHRITIS, KNEES, BILATERAL, SEVERE 06/14/2009  . HYPERLIPIDEMIA 02/16/2010  . ANEMIA, PERNICIOUS 01/30/2009  . HYPERTENSION 10/28/2006  . VITAMIN B12 DEFICIENCY 06/14/2009  . ECZEMA 10/28/2006  . DEGENERATIVE DISC DISEASE 10/28/2006  . Impaired glucose tolerance 12/31/2010  . Sebaceous cyst     chest   . MS (multiple sclerosis) (HCC)   . Gait disorder   . Obesity   . Cataracts, bilateral   . Glaucoma    Past Surgical History  Procedure Laterality Date  . Tubal ligation  1958  . Upper gastrointestinal endoscopy    . Colonoscopy      reports that she has never smoked. She has never used smokeless tobacco. She reports that she does not drink alcohol or use illicit drugs. family history includes Arthritis in her  mother and sister; Bone cancer in her mother; Cancer in her mother; Diabetes in her sister. Allergies  Allergen Reactions  . Atorvastatin     REACTION: feels funny in face and head   Current Outpatient Prescriptions on File Prior to Visit  Medication Sig Dispense Refill  . amLODipine (NORVASC) 5 MG tablet Take 1 tablet (5 mg total) by mouth daily. 90 tablet 3  . aspirin 81 MG tablet Take 81 mg by mouth daily.      . cloNIDine (CATAPRES) 0.3 MG tablet TAKE 1 TABLET TWICE DAILY 180 tablet 1  . furosemide (LASIX) 20 MG tablet TAKE 1 TABLET (20 MG TOTAL) BY MOUTH DAILY AS NEEDED FOR SWELLING 90 tablet 3  . latanoprost (XALATAN) 0.005 % ophthalmic solution INSTILL 1 DROP IN BOTH EYES AT BEDTIME. KEEP THE BOTTLE IN THE FRIDGE UNTIL YOU OPEN IT.  3  . levocetirizine (XYZAL) 5 MG tablet Take 1 tablet (5 mg total) by mouth every evening. 30 tablet 11  . nitrofurantoin (MACRODANTIN) 50 MG capsule Take 1 capsule (50 mg total) by mouth daily. 90 capsule 1  . simvastatin (ZOCOR) 40 MG tablet Take 40 mg by mouth every evening.    . timolol (TIMOPTIC) 0.5 % ophthalmic solution INSTILL ONE DROP IN [facility] EYE ONCE DAILY IN THE MORNING  4  . vitamin B-12 (CYANOCOBALAMIN) 500 MCG tablet Take 500 mcg by mouth daily.     No current facility-administered medications on file prior to visit.    Review of Systems Constitutional: Negative  for increased diaphoresis, other activity, appetite or siginficant weight change other than noted HENT: Negative for worsening hearing loss, ear pain, facial swelling, mouth sores and neck stiffness.   Eyes: Negative for other worsening pain, redness or visual disturbance.  Respiratory: Negative for shortness of breath and wheezing  Cardiovascular: Negative for chest pain and palpitations.  Gastrointestinal: Negative for diarrhea, blood in stool, abdominal distention or other pain Genitourinary: Negative for hematuria, flank pain or change in urine volume.  Musculoskeletal:  Negative for myalgias or other joint complaints.  Skin: Negative for color change and wound or drainage.  Neurological: Negative for syncope and numbness. other than noted Hematological: Negative for adenopathy. or other swelling Psychiatric/Behavioral: Negative for hallucinations, SI, self-injury, decreased concentration or other worsening agitation.      Objective:   Physical Exam BP 118/80 mmHg  Pulse 47  Temp(Src) 97.9 F (36.6 C) (Oral)  Resp 20  Wt 250 lb (113.399 kg)  SpO2 96% VS noted, examined in wheelchair Constitutional: Pt is oriented to person, place, and time. Appears well-developed and well-nourished, in no significant distress Head: Normocephalic and atraumatic.  Right Ear: External ear normal.  Left Ear: External ear normal.  Nose: Nose normal.  Mouth/Throat: Oropharynx is clear and moist.  Eyes: Conjunctivae and EOM are normal. Pupils are equal, round, and reactive to light.  Neck: Normal range of motion. Neck supple. No JVD present. No tracheal deviation present or significant neck LA or mass Cardiovascular: Normal rate, regular rhythm, normal heart sounds and intact distal pulses.   Pulmonary/Chest: Effort normal and breath sounds without rales or wheezing  Abdominal: Soft. Bowel sounds are normal. NT. No HSM  Musculoskeletal: Normal range of motion. Exhibits no edema.  Lymphadenopathy:  Has no cervical adenopathy.  Neurological: Pt is alert and oriented to person, place, and time. O/w not done in detail Skin: Skin is warm and dry. No rash noted.  Psychiatric:  Has normal mood and affect. Behavior is normal.     Assessment & Plan:

## 2015-05-16 NOTE — Assessment & Plan Note (Signed)
stable overall by history and exam, recent data reviewed with pt, and pt to continue medical treatment as before,  to f/u any worsening symptoms or concerns Lab Results  Component Value Date   HGBA1C 6.3 05/14/2015

## 2015-05-16 NOTE — Patient Instructions (Signed)
Please continue all other medications as before, and refills have been done if requested.  Please have the pharmacy call with any other refills you may need.  Please continue your efforts at being more active, low cholesterol diet, and weight control.  You are otherwise up to date with prevention measures today.  Please keep your appointments with your specialists as you may have planned  Please return in 6 months, or sooner if needed 

## 2015-07-02 DIAGNOSIS — H2512 Age-related nuclear cataract, left eye: Secondary | ICD-10-CM | POA: Diagnosis not present

## 2015-07-02 DIAGNOSIS — H2511 Age-related nuclear cataract, right eye: Secondary | ICD-10-CM | POA: Diagnosis not present

## 2015-07-02 DIAGNOSIS — H18411 Arcus senilis, right eye: Secondary | ICD-10-CM | POA: Diagnosis not present

## 2015-07-02 DIAGNOSIS — H02839 Dermatochalasis of unspecified eye, unspecified eyelid: Secondary | ICD-10-CM | POA: Diagnosis not present

## 2015-07-06 ENCOUNTER — Other Ambulatory Visit: Payer: Self-pay | Admitting: Internal Medicine

## 2015-07-17 DIAGNOSIS — Z1231 Encounter for screening mammogram for malignant neoplasm of breast: Secondary | ICD-10-CM | POA: Diagnosis not present

## 2015-07-30 ENCOUNTER — Encounter: Payer: Self-pay | Admitting: Internal Medicine

## 2015-08-09 ENCOUNTER — Other Ambulatory Visit: Payer: Self-pay | Admitting: Internal Medicine

## 2015-08-20 ENCOUNTER — Other Ambulatory Visit: Payer: Self-pay | Admitting: *Deleted

## 2015-08-20 MED ORDER — NITROFURANTOIN MACROCRYSTAL 50 MG PO CAPS: 50.0000 mg | ORAL_CAPSULE | Freq: Every day | ORAL | Status: DC

## 2015-08-26 DIAGNOSIS — H25811 Combined forms of age-related cataract, right eye: Secondary | ICD-10-CM | POA: Diagnosis not present

## 2015-08-26 DIAGNOSIS — H2511 Age-related nuclear cataract, right eye: Secondary | ICD-10-CM | POA: Diagnosis not present

## 2015-08-27 DIAGNOSIS — H2512 Age-related nuclear cataract, left eye: Secondary | ICD-10-CM | POA: Diagnosis not present

## 2015-09-11 ENCOUNTER — Other Ambulatory Visit: Payer: Self-pay | Admitting: Internal Medicine

## 2015-09-11 ENCOUNTER — Encounter: Payer: Self-pay | Admitting: Internal Medicine

## 2015-09-11 ENCOUNTER — Ambulatory Visit (INDEPENDENT_AMBULATORY_CARE_PROVIDER_SITE_OTHER): Payer: Medicare Other | Admitting: Internal Medicine

## 2015-09-11 VITALS — BP 130/74 | HR 65 | Temp 98.2°F | Resp 20 | Wt 250.0 lb

## 2015-09-11 DIAGNOSIS — I10 Essential (primary) hypertension: Secondary | ICD-10-CM | POA: Diagnosis not present

## 2015-09-11 DIAGNOSIS — W57XXXA Bitten or stung by nonvenomous insect and other nonvenomous arthropods, initial encounter: Secondary | ICD-10-CM

## 2015-09-11 DIAGNOSIS — T148 Other injury of unspecified body region: Secondary | ICD-10-CM | POA: Diagnosis not present

## 2015-09-11 DIAGNOSIS — R7302 Impaired glucose tolerance (oral): Secondary | ICD-10-CM

## 2015-09-11 DIAGNOSIS — E785 Hyperlipidemia, unspecified: Secondary | ICD-10-CM | POA: Diagnosis not present

## 2015-09-11 DIAGNOSIS — L089 Local infection of the skin and subcutaneous tissue, unspecified: Secondary | ICD-10-CM

## 2015-09-11 MED ORDER — MUPIROCIN CALCIUM 2 % EX CREA: 1.0000 "application " | TOPICAL_CREAM | Freq: Two times a day (BID) | CUTANEOUS | Status: DC

## 2015-09-11 MED ORDER — AMLODIPINE BESYLATE 5 MG PO TABS: 5.0000 mg | ORAL_TABLET | Freq: Every day | ORAL | Status: DC

## 2015-09-11 MED ORDER — SIMVASTATIN 40 MG PO TABS: 40.0000 mg | ORAL_TABLET | Freq: Every evening | ORAL | Status: DC

## 2015-09-11 NOTE — Patient Instructions (Signed)
Please take all new medication as prescribed - the antibiotic cream, to use twice per day for 1 wk  Please continue all other medications as before, and refills have been done if requested.  Please have the pharmacy call with any other refills you may need.  Please keep your appointments with your specialists as you may have planned

## 2015-09-11 NOTE — Progress Notes (Signed)
Subjective:    Patient ID: Deanna Wall, female    DOB: 1931-09-07, 82 y.o.   MRN: [id number]  HPI  Here with c/o 2 days onset mild to mod skin irritated area to right lower back, constant persistent, gradually worse, now more painful today and raised/swollen but no drainage, fever, red streaks ro fluctuance.  Mild worse to touch, nothing seems to make better.  Did place some neosporin last night, with some improvement today.  Pt denies chest pain, increased sob or doe, wheezing, orthopnea, PND, increased LE swelling, palpitations, dizziness or syncope.  Pt denies new neurological symptoms such as new headache, or facial or extremity weakness or numbness   Pt denies polydipsia, polyuria.  Past Medical History  Diagnosis Date  . Multiple sclerosis (HCC) 10/28/2006  . OSTEOARTHRITIS, KNEES, BILATERAL, SEVERE 06/14/2009  . HYPERLIPIDEMIA 02/16/2010  . ANEMIA, PERNICIOUS 01/30/2009  . HYPERTENSION 10/28/2006  . VITAMIN B12 DEFICIENCY 06/14/2009  . ECZEMA 10/28/2006  . DEGENERATIVE DISC DISEASE 10/28/2006  . Impaired glucose tolerance 12/31/2010  . Sebaceous cyst     chest   . MS (multiple sclerosis) (HCC)   . Gait disorder   . Obesity   . Cataracts, bilateral   . Glaucoma    Past Surgical History  Procedure Laterality Date  . Tubal ligation  1958  . Upper gastrointestinal endoscopy    . Colonoscopy      reports that she has never smoked. She has never used smokeless tobacco. She reports that she does not drink alcohol or use illicit drugs. family history includes Arthritis in her mother and sister; Bone cancer in her mother; Cancer in her mother; Diabetes in her sister. Allergies  Allergen Reactions  . Atorvastatin     REACTION: feels funny in face and head   Current Outpatient Prescriptions on File Prior to Visit  Medication Sig Dispense Refill  . aspirin 81 MG tablet Take 81 mg by mouth daily.      . cloNIDine (CATAPRES) 0.3 MG tablet TAKE ONE TABLET BY MOUTH TWICE DAILY 180 tablet 0    . furosemide (LASIX) 20 MG tablet TAKE 1 TABLET (20 MG TOTAL) BY MOUTH DAILY AS NEEDED FOR SWELLING 90 tablet 3  . latanoprost (XALATAN) 0.005 % ophthalmic solution INSTILL 1 DROP IN BOTH EYES AT BEDTIME. KEEP THE BOTTLE IN THE FRIDGE UNTIL YOU OPEN IT.  3  . levocetirizine (XYZAL) 5 MG tablet Take 1 tablet (5 mg total) by mouth every evening. 30 tablet 11  . nitrofurantoin (MACRODANTIN) 50 MG capsule Take 1 capsule (50 mg total) by mouth daily. 90 capsule 0  . timolol (TIMOPTIC) 0.5 % ophthalmic solution INSTILL ONE DROP IN EACH EYE ONCE DAILY IN THE MORNING  4  . vitamin B-12 (CYANOCOBALAMIN) 500 MCG tablet Take 500 mcg by mouth daily.     No current facility-administered medications on file prior to visit.   Review of Systems  Constitutional: Negative for unusual diaphoresis or night sweats HENT: Negative for ear swelling or discharge Eyes: Negative for worsening visual haziness  Respiratory: Negative for choking and stridor.   Gastrointestinal: Negative for distension or worsening eructation Genitourinary: Negative for retention or change in urine volume.  Musculoskeletal: Negative for other MSK pain or swelling Skin: Negative for color change and worsening wound Neurological: Negative for tremors and numbness other than noted  Psychiatric/Behavioral: Negative for decreased concentration or agitation other than above       Objective:   Physical Exam BP 130/74 mmHg  Pulse 65  Temp(Src) 98.2 F (36.8 C) (Oral)  Resp 20  Wt 250 lb (113.399 kg)  SpO2 95% VS noted,  Constitutional: Pt appears in no apparent distress HENT: Head: NCAT.  Right Ear: External ear normal.  Left Ear: External ear normal.  Eyes: . Pupils are equal, round, and reactive to light. Conjunctivae and EOM are normal Neck: Normal range of motion. Neck supple.  Cardiovascular: Normal rate and regular rhythm.   Pulmonary/Chest: Effort normal and breath sounds without rales or wheezing.  Neurological: Pt is  alert. Not confused , motor grossly intact Skin: Skin is warm.  no LE edema, right lower back with 2 cm area mild erythema and slight tender, slight raised with 2 x 5 mm minimal shallow ulcerations Psychiatric: Pt behavior is normal. No agitation.     Assessment & Plan:

## 2015-09-11 NOTE — Progress Notes (Signed)
Pre visit review using our clinic review tool, if applicable. No additional management support is needed unless otherwise documented below in the visit note. 

## 2015-09-15 NOTE — Assessment & Plan Note (Signed)
stable overall by history and exam, recent data reviewed with pt, and pt to continue medical treatment as before,  to f/u any worsening symptoms or concerns BP Readings from Last 3 Encounters:  09/11/15 130/74  05/16/15 118/80  04/10/15 106/59

## 2015-09-15 NOTE — Assessment & Plan Note (Signed)
stable overall by history and exam, recent data reviewed with pt, and pt to continue medical treatment as before,  to f/u any worsening symptoms or concerns Lab Results  Component Value Date   LDLCALC 80 05/14/2015

## 2015-09-15 NOTE — Assessment & Plan Note (Signed)
Mild to mod, for antibx course,  to f/u any worsening symptoms or concerns 

## 2015-09-15 NOTE — Assessment & Plan Note (Signed)
stable overall by history and exam, recent data reviewed with pt, and pt to continue medical treatment as before,  to f/u any worsening symptoms or concerns Lab Results  Component Value Date   HGBA1C 6.3 05/14/2015    

## 2015-09-16 DIAGNOSIS — H2512 Age-related nuclear cataract, left eye: Secondary | ICD-10-CM | POA: Diagnosis not present

## 2015-10-04 ENCOUNTER — Other Ambulatory Visit: Payer: Self-pay | Admitting: Internal Medicine

## 2015-11-13 ENCOUNTER — Ambulatory Visit (INDEPENDENT_AMBULATORY_CARE_PROVIDER_SITE_OTHER): Payer: Medicare Other | Admitting: Internal Medicine

## 2015-11-13 ENCOUNTER — Encounter: Payer: Self-pay | Admitting: Internal Medicine

## 2015-11-13 VITALS — BP 128/76 | HR 54 | Temp 97.9°F | Resp 10 | Wt 250.0 lb

## 2015-11-13 DIAGNOSIS — R7302 Impaired glucose tolerance (oral): Secondary | ICD-10-CM

## 2015-11-13 DIAGNOSIS — I492 Junctional premature depolarization: Secondary | ICD-10-CM

## 2015-11-13 DIAGNOSIS — N390 Urinary tract infection, site not specified: Secondary | ICD-10-CM | POA: Diagnosis not present

## 2015-11-13 DIAGNOSIS — E785 Hyperlipidemia, unspecified: Secondary | ICD-10-CM

## 2015-11-13 DIAGNOSIS — Z0001 Encounter for general adult medical examination with abnormal findings: Secondary | ICD-10-CM

## 2015-11-13 DIAGNOSIS — Z23 Encounter for immunization: Secondary | ICD-10-CM | POA: Diagnosis not present

## 2015-11-13 DIAGNOSIS — I10 Essential (primary) hypertension: Secondary | ICD-10-CM

## 2015-11-13 DIAGNOSIS — I499 Cardiac arrhythmia, unspecified: Secondary | ICD-10-CM

## 2015-11-13 DIAGNOSIS — R6889 Other general symptoms and signs: Secondary | ICD-10-CM

## 2015-11-13 MED ORDER — FUROSEMIDE 20 MG PO TABS: ORAL_TABLET | ORAL | 3 refills | Status: DC

## 2015-11-13 NOTE — Assessment & Plan Note (Signed)
stable overall by history and exam, recent data reviewed with pt, and pt to continue medical treatment as before,  to f/u any worsening symptoms or concerns BP Readings from Last 3 Encounters:  11/13/15 128/76  09/11/15 130/74  05/16/15 118/80

## 2015-11-13 NOTE — Progress Notes (Signed)
Pre visit review using our clinic review tool, if applicable. No additional management support is needed unless otherwise documented below in the visit note. 

## 2015-11-13 NOTE — Patient Instructions (Signed)
Your EKG was OK  Please continue all other medications as before, and refills have been done if requested.  Please have the pharmacy call with any other refills you may need.  Please continue your efforts at being more active, low cholesterol diet, and weight control.  Please keep your appointments with your specialists as you may have planned

## 2015-11-13 NOTE — Assessment & Plan Note (Signed)
stable overall by history and exam, recent data reviewed with pt, and pt to continue medical treatment as before,  to f/u any worsening symptoms or concerns Lab Results  Component Value Date   HGBA1C 6.3 05/14/2015    

## 2015-11-13 NOTE — Assessment & Plan Note (Signed)
stable overall by history and exam, recent data reviewed with pt, and pt to continue medical treatment as before,  to f/u any worsening symptoms or concerns Lab Results  Component Value Date   LDLCALC 80 05/14/2015

## 2015-11-13 NOTE — Assessment & Plan Note (Signed)
Asympt, on daily prophylaxis,  to f/u any worsening symptoms or concerns

## 2015-11-13 NOTE — Assessment & Plan Note (Signed)
ecg reviewed c/w sinus bradycardia, asympt, exam o/w benign, cont to follow

## 2015-11-13 NOTE — Progress Notes (Addendum)
Subjective:    Patient ID: Deanna Wall, female    DOB: 01/09/1932, 82 y.o.   MRN: [id number]  HPI  Here to f/u; overall doing ok,  Pt denies chest pain, increasing sob or doe, wheezing, orthopnea, PND, increased LE swelling, palpitations, dizziness or syncope.  Pt denies new neurological symptoms such as new headache, or facial or extremity weakness or numbness.  Pt denies polydipsia, polyuria, or low sugar episode.   Pt denies new neurological symptoms such as new headache, or facial or extremity weakness or numbness.   Pt states overall good compliance with meds, mostly trying to follow appropriate diet, with wt overall stable,  but little exercise however.  Still does all the cooking for herself and husband, and the whole family on sundays.  Denies urinary symptoms such as dysuria, frequency, urgency, flank pain, hematuria or n/v, fever, chills. Pt denies new neurological symptoms such as new headache, or facial or extremity weakness or numbness, cant stand or walk but no new changes. No falls.  Can use the walker herself for support for using commode and showers.  S/p recent bilat cataract removal  Next neuro appt with Dr Anne Hahnwillis in jan 2018 Past Medical History:  Diagnosis Date  . ANEMIA, PERNICIOUS 01/30/2009  . Cataracts, bilateral   . DEGENERATIVE DISC DISEASE 10/28/2006  . ECZEMA 10/28/2006  . Gait disorder   . Glaucoma   . HYPERLIPIDEMIA 02/16/2010  . HYPERTENSION 10/28/2006  . Impaired glucose tolerance 12/31/2010  . MS (multiple sclerosis) (HCC)   . Multiple sclerosis (HCC) 10/28/2006  . Obesity   . OSTEOARTHRITIS, KNEES, BILATERAL, SEVERE 06/14/2009  . Sebaceous cyst    chest   . VITAMIN B12 DEFICIENCY 06/14/2009   Past Surgical History:  Procedure Laterality Date  . COLONOSCOPY    . TUBAL LIGATION  1958  . UPPER GASTROINTESTINAL ENDOSCOPY      reports that she has never smoked. She has never used smokeless tobacco. She reports that she does not drink alcohol or use  drugs. family history includes Arthritis in her mother and sister; Bone cancer in her mother; Cancer in her mother; Diabetes in her sister. Allergies  Allergen Reactions  . Atorvastatin     REACTION: feels funny in face and head   Current Outpatient Prescriptions on File Prior to Visit  Medication Sig Dispense Refill  . amLODipine (NORVASC) 5 MG tablet Take 1 tablet (5 mg total) by mouth daily. 90 tablet 3  . aspirin 81 MG tablet Take 81 mg by mouth daily.      . cloNIDine (CATAPRES) 0.3 MG tablet TAKE ONE TABLET BY MOUTH TWICE DAILY 180 tablet 0  . furosemide (LASIX) 20 MG tablet TAKE 1 TABLET (20 MG TOTAL) BY MOUTH DAILY AS NEEDED FOR SWELLING 90 tablet 3  . latanoprost (XALATAN) 0.005 % ophthalmic solution INSTILL 1 DROP IN BOTH EYES AT BEDTIME. KEEP THE BOTTLE IN THE FRIDGE UNTIL YOU OPEN IT.  3  . levocetirizine (XYZAL) 5 MG tablet Take 1 tablet (5 mg total) by mouth every evening. 30 tablet 11  . mupirocin cream (BACTROBAN) 2 % Apply 1 application topically 2 (two) times daily. 15 g 0  . nitrofurantoin (MACRODANTIN) 50 MG capsule Take 1 capsule (50 mg total) by mouth daily. 90 capsule 0  . simvastatin (ZOCOR) 40 MG tablet Take 1 tablet (40 mg total) by mouth every evening. 90 tablet 3  . timolol (TIMOPTIC) 0.5 % ophthalmic [facility]  MORN[facility]  4  . vitamin B-12 (CYANOCOBALAMIN) 500 MCG tablet Take 500 mcg by mouth daily.     No current facility-administered medications on file prior to visit.    Review of Systems  Constitutional: Negative for unusual diaphoresis or night sweats HENT: Negative for ear swelling or discharge Eyes: Negative for worsening visual haziness  Respiratory: Negative for choking and stridor.   Gastrointestinal: Negative for distension or worsening eructation Genitourinary: Negative for retention or change in urine volume.  Musculoskeletal: Negative for other MSK pain or swelling Skin: Negative for color change and  worsening wound Neurological: Negative for tremors and numbness other than noted  Psychiatric/Behavioral: Negative for decreased concentration or agitation other than above       Objective:   Physical Exam BP 128/76   Pulse (!) 54   Temp 97.9 F (36.6 C) (Oral)   Resp 10   Wt 250 lb (113.4 kg)   SpO2 98%   BMI 39.16 kg/m  VS noted,  Constitutional: Pt appears in no apparent distress HENT: Head: NCAT.  Right Ear: External ear normal.  Left Ear: External ear normal.  Eyes: . Pupils are equal, round, and reactive to light. Conjunctivae and EOM are normal Neck: Normal range of motion. Neck supple.  Cardiovascular: mild slow rate and irregular rhythm.   Pulmonary/Chest: Effort normal and breath sounds without rales or wheezing.  Abd:  Soft, NT, ND, + BS Neurological: Pt is alert. Not confused , motor grossly intact Skin: Skin is warm. No rash, no LE edema Psychiatric: Pt behavior is normal. No agitation.   Today ecg personally reviewed by me Marked sinus  Bradycardia  -consider old anterior infarct.   ABNORMAL     Assessment & Plan:

## 2015-12-04 ENCOUNTER — Encounter: Payer: Self-pay | Admitting: Gastroenterology

## 2015-12-23 ENCOUNTER — Other Ambulatory Visit: Payer: Self-pay | Admitting: Internal Medicine

## 2015-12-26 ENCOUNTER — Ambulatory Visit (INDEPENDENT_AMBULATORY_CARE_PROVIDER_SITE_OTHER): Payer: Medicare Other | Admitting: Internal Medicine

## 2015-12-26 VITALS — BP 130/70 | HR 84 | Temp 98.7°F | Resp 20 | Wt 250.0 lb

## 2015-12-26 DIAGNOSIS — I10 Essential (primary) hypertension: Secondary | ICD-10-CM | POA: Diagnosis not present

## 2015-12-26 DIAGNOSIS — R300 Dysuria: Secondary | ICD-10-CM

## 2015-12-26 DIAGNOSIS — R7302 Impaired glucose tolerance (oral): Secondary | ICD-10-CM | POA: Diagnosis not present

## 2015-12-26 LAB — POCT URINALYSIS DIPSTICK
BILIRUBIN UA: NEGATIVE
Blood, UA: NEGATIVE
GLUCOSE UA: NEGATIVE
Ketones, UA: NEGATIVE
LEUKOCYTES UA: NEGATIVE
NITRITE UA: NEGATIVE
PH UA: 6.5
Protein, UA: NEGATIVE
Spec Grav, UA: 1.025
Urobilinogen, UA: NEGATIVE

## 2015-12-26 MED ORDER — CEPHALEXIN 500 MG PO CAPS: 500.0000 mg | ORAL_CAPSULE | Freq: Three times a day (TID) | ORAL | 0 refills | Status: DC

## 2015-12-26 NOTE — Assessment & Plan Note (Signed)
stable overall by history and exam, recent data reviewed with pt, and pt to continue medical treatment as before,  to f/u any worsening symptoms or concerns BP Readings from Last 3 Encounters:  12/26/15 130/70  11/13/15 128/76  09/11/15 130/74

## 2015-12-26 NOTE — Progress Notes (Signed)
Subjective:    Patient ID: Deanna Wall, female    DOB: [birth year], 82 y.o.   MRN: [id number]  HPI  Here with c/o 2 days onset mild to mod, constant persistent dysuria, without radiation of pain but with some nausea, no vomiting.  Denies urinary symptoms such as frequency, urgency, flank pain, hematuria.   Has felt warm, but not sure about fever. No chills. First infection in over 1 yr, as she takes the macrobid daily prophylactic.  Pt denies chest pain, increased sob or doe, wheezing, orthopnea, PND, increased LE swelling, palpitations, dizziness or syncope.  Denies worsening reflux, abd pain, dysphagia, bowel change or blood.Pt denies new neurological symptoms such as new headache, or facial or extremity weakness or numbness - feels her MS has been stable, has f/u appt in Jan 2018   Pt denies polydipsia, polyuria Past Medical History:  Diagnosis Date  . ANEMIA, PERNICIOUS 01/30/2009  . Cataracts, bilateral   . DEGENERATIVE DISC DISEASE 10/28/2006  . ECZEMA 10/28/2006  . Gait disorder   . Glaucoma   . HYPERLIPIDEMIA 02/16/2010  . HYPERTENSION 10/28/2006  . Impaired glucose tolerance 12/31/2010  . MS (multiple sclerosis) (HCC)   . Multiple sclerosis (HCC) 10/28/2006  . Obesity   . OSTEOARTHRITIS, KNEES, BILATERAL, SEVERE 06/14/2009  . Sebaceous cyst    chest   . VITAMIN B12 DEFICIENCY 06/14/2009   Past Surgical History:  Procedure Laterality Date  . COLONOSCOPY    . TUBAL LIGATION  1958  . UPPER GASTROINTESTINAL ENDOSCOPY      reports that she has never smoked. She has never used smokeless tobacco. She reports that she does not drink alcohol or use drugs. family history includes Arthritis in her mother and sister; Bone cancer in her mother; Cancer in her mother; Diabetes in her sister. Allergies  Allergen Reactions  . Atorvastatin     REACTION: feels funny in face and head   Current Outpatient Prescriptions on File Prior to Visit  Medication Sig Dispense Refill  . amLODipine  (NORVASC) 5 MG tablet Take 1 tablet (5 mg total) by mouth daily. 90 tablet 3  . aspirin 81 MG tablet Take 81 mg by mouth daily.      . cloNIDine (CATAPRES) 0.3 MG tablet TAKE ONE TABLET BY MOUTH TWICE DAILY 180 tablet 0  . furosemide (LASIX) 20 MG tablet TAKE 1 TABLET (20 MG TOTAL) BY MOUTH DAILY AS NEEDED FOR SWELLING 90 tablet 3  . latanoprost (XALATAN) 0.005 % ophthalmic solution INSTILL 1 DROP IN BOTH EYES AT BEDTIME. KEEP THE BOTTLE IN THE FRIDGE UNTIL YOU OPEN IT.  3  . levocetirizine (XYZAL) 5 MG tablet Take 1 tablet (5 mg total) by mouth every evening. 30 tablet 11  . mupirocin cream (BACTROBAN) 2 % Apply 1 application topically 2 (two) times daily. 15 g 0  . nitrofurantoin (MACRODANTIN) 50 MG capsule Take 1 capsule (50 mg total) by mouth daily. 90 capsule 0  . nitrofurantoin (MACRODANTIN) 50 MG capsule TAKE ONE CAPSULE BY MOUTH ONCE DAILY 90 capsule 0  . simvastatin (ZOCOR) 40 MG tablet Take 1 tablet (40 mg total) by mouth every evening. 90 tablet 3  . timolol (TIMOPTIC) 0.5 % ophthalmic solution INSTILL ONE DROP IN EACH EYE ONCE DAILY IN THE MORNING  4  . vitamin B-12 (CYANOCOBALAMIN) 500 MCG tablet Take 500 mcg by mouth daily.     No current facility-administered medications on file prior to visit.     Review of Systems  Constitutional: Negative for  unusual diaphoresis or night sweats HENT: Negative for ear swelling or discharge Eyes: Negative for worsening visual haziness  Respiratory: Negative for choking and stridor.   Gastrointestinal: Negative for distension or worsening eructation Genitourinary: Negative for retention or change in urine volume.  Musculoskeletal: Negative for other MSK pain or swelling Skin: Negative for color change and worsening wound Neurological: Negative for tremors and numbness other than noted  Psychiatric/Behavioral: Negative for decreased concentration or agitation other than above       Objective:   Physical Exam BP 130/70   Pulse 84   Temp  98.7 F (37.1 C) (Oral)   Resp 20   Wt 250 lb (113.4 kg)   SpO2 96%   BMI 39.16 kg/m  VS noted, mild ill, seated in wheelchair, cannot stand without help Constitutional: Pt appears in no apparent distress HENT: Head: NCAT.  Right Ear: External ear normal.  Left Ear: External ear normal.  Eyes: . Pupils are equal, round, and reactive to light. Conjunctivae and EOM are normal Neck: Normal range of motion. Neck supple.  Cardiovascular: Normal rate and regular rhythm.   Pulmonary/Chest: Effort normal and breath sounds without rales or wheezing.  Abd:  Soft, NT, ND, + BS, no flank tender Neurological: Pt is alert. Not confused , motor grossly intact Skin: Skin is warm. No rash, no LE edema Psychiatric: Pt behavior is normal. No agitation.   POCT urinalysis dipstick  Order: [id number]  Status:  Final result  Visible to patient:  No (Not Released)  Next appt:  04/09/2016 at 10:00 AM in [facility](WILLIS,CHARLES KEITH, MD)  Dx:  Dysuria   Ref Range & Units 14:12  Color, UA  yellow   Clarity, UA  cloudy   Glucose, UA  negative   Bilirubin, UA  negative   Ketones, UA  negative   Spec Grav, UA  1.025   Blood, UA  negative   pH, UA  6.5   Protein, UA  negative   Urobilinogen, UA  negative   Nitrite, UA  negative   Leukocytes, UA Negative Negative            Assessment & Plan:

## 2015-12-26 NOTE — Assessment & Plan Note (Signed)
Mild to mod, possible UTI, udip neg but suspect low grade infetion, for urine cx, antibx course,  to f/u any worsening symptoms or concerns

## 2015-12-26 NOTE — Patient Instructions (Addendum)
Please take all new medication as prescribed - the antibiotic (cephalexin) for 10 days  OK to HOLD the nitrofurantoin while taking the new antibiotic  Your specimen will be sent for culture, and will take several days  Please continue all other medications as before, and refills have been done if requested.  Please have the pharmacy call with any other refills you may need.  Please keep your appointments with your specialists as you may have planned

## 2015-12-26 NOTE — Assessment & Plan Note (Signed)
stable overall by history and exam, recent data reviewed with pt, and pt to continue medical treatment as before,  to f/u any worsening symptoms or concerns Lab Results  Component Value Date   HGBA1C 6.3 05/14/2015   Pt to call for onset polys or sugar > 200

## 2015-12-26 NOTE — Progress Notes (Signed)
Pre visit review using our clinic review tool, if applicable. No additional management support is needed unless otherwise documented below in the visit note. 

## 2016-01-02 ENCOUNTER — Other Ambulatory Visit: Payer: Self-pay | Admitting: Internal Medicine

## 2016-01-30 DIAGNOSIS — H18413 Arcus senilis, bilateral: Secondary | ICD-10-CM | POA: Diagnosis not present

## 2016-01-30 DIAGNOSIS — H47233 Glaucomatous optic atrophy, bilateral: Secondary | ICD-10-CM | POA: Diagnosis not present

## 2016-01-30 DIAGNOSIS — Z9849 Cataract extraction status, unspecified eye: Secondary | ICD-10-CM | POA: Diagnosis not present

## 2016-01-30 DIAGNOSIS — H11423 Conjunctival edema, bilateral: Secondary | ICD-10-CM | POA: Diagnosis not present

## 2016-01-30 DIAGNOSIS — H401232 Low-tension glaucoma, bilateral, moderate stage: Secondary | ICD-10-CM | POA: Diagnosis not present

## 2016-01-30 DIAGNOSIS — Z961 Presence of intraocular lens: Secondary | ICD-10-CM | POA: Diagnosis not present

## 2016-01-30 DIAGNOSIS — H11153 Pinguecula, bilateral: Secondary | ICD-10-CM | POA: Diagnosis not present

## 2016-01-30 DIAGNOSIS — H04123 Dry eye syndrome of bilateral lacrimal glands: Secondary | ICD-10-CM | POA: Diagnosis not present

## 2016-04-01 ENCOUNTER — Other Ambulatory Visit: Payer: Self-pay | Admitting: Internal Medicine

## 2016-04-09 ENCOUNTER — Ambulatory Visit: Payer: Medicare Other | Admitting: Neurology

## 2016-05-01 DIAGNOSIS — H11153 Pinguecula, bilateral: Secondary | ICD-10-CM | POA: Diagnosis not present

## 2016-05-01 DIAGNOSIS — H401232 Low-tension glaucoma, bilateral, moderate stage: Secondary | ICD-10-CM | POA: Diagnosis not present

## 2016-05-01 DIAGNOSIS — H47233 Glaucomatous optic atrophy, bilateral: Secondary | ICD-10-CM | POA: Diagnosis not present

## 2016-05-01 DIAGNOSIS — H11423 Conjunctival edema, bilateral: Secondary | ICD-10-CM | POA: Diagnosis not present

## 2016-05-13 ENCOUNTER — Encounter: Payer: Self-pay | Admitting: Internal Medicine

## 2016-05-13 ENCOUNTER — Other Ambulatory Visit (INDEPENDENT_AMBULATORY_CARE_PROVIDER_SITE_OTHER): Payer: Medicare Other

## 2016-05-13 ENCOUNTER — Ambulatory Visit (INDEPENDENT_AMBULATORY_CARE_PROVIDER_SITE_OTHER): Payer: Medicare Other | Admitting: Internal Medicine

## 2016-05-13 VITALS — BP 110/64 | HR 48 | Temp 97.4°F | Ht 67.0 in | Wt 258.0 lb

## 2016-05-13 DIAGNOSIS — R7302 Impaired glucose tolerance (oral): Secondary | ICD-10-CM | POA: Diagnosis not present

## 2016-05-13 DIAGNOSIS — D649 Anemia, unspecified: Secondary | ICD-10-CM

## 2016-05-13 DIAGNOSIS — K5900 Constipation, unspecified: Secondary | ICD-10-CM | POA: Diagnosis not present

## 2016-05-13 DIAGNOSIS — Z0000 Encounter for general adult medical examination without abnormal findings: Secondary | ICD-10-CM | POA: Diagnosis not present

## 2016-05-13 DIAGNOSIS — G35 Multiple sclerosis: Secondary | ICD-10-CM

## 2016-05-13 LAB — URINALYSIS, ROUTINE W REFLEX MICROSCOPIC
Bilirubin Urine: NEGATIVE
Hgb urine dipstick: NEGATIVE
Ketones, ur: NEGATIVE
Nitrite: NEGATIVE
PH: 7 (ref 5.0–8.0)
RBC / HPF: NONE SEEN (ref 0–?)
Specific Gravity, Urine: 1.01 (ref 1.000–1.030)
TOTAL PROTEIN, URINE-UPE24: NEGATIVE
URINE GLUCOSE: NEGATIVE
UROBILINOGEN UA: 0.2 (ref 0.0–1.0)

## 2016-05-13 LAB — CBC WITH DIFFERENTIAL/PLATELET
BASOS ABS: 0.1 10*3/uL (ref 0.0–0.1)
Basophils Relative: 1.1 % (ref 0.0–3.0)
EOS ABS: 0.1 10*3/uL (ref 0.0–0.7)
EOS PCT: 1.8 % (ref 0.0–5.0)
HCT: 34.2 % — ABNORMAL LOW (ref 36.0–46.0)
HEMOGLOBIN: 11.3 g/dL — AB (ref 12.0–15.0)
LYMPHS ABS: 2 10*3/uL (ref 0.7–4.0)
Lymphocytes Relative: 32.8 % (ref 12.0–46.0)
MCHC: 33 g/dL (ref 30.0–36.0)
MCV: 84.4 fl (ref 78.0–100.0)
MONO ABS: 0.4 10*3/uL (ref 0.1–1.0)
Monocytes Relative: 6.7 % (ref 3.0–12.0)
NEUTROS PCT: 57.6 % (ref 43.0–77.0)
Neutro Abs: 3.6 10*3/uL (ref 1.4–7.7)
Platelets: 333 10*3/uL (ref 150.0–400.0)
RBC: 4.05 Mil/uL (ref 3.87–5.11)
RDW: 15.2 % (ref 11.5–15.5)
WBC: 6.2 10*3/uL (ref 4.0–10.5)

## 2016-05-13 LAB — IBC PANEL
Iron: 41 ug/dL — ABNORMAL LOW (ref 42–145)
Saturation Ratios: 13.2 % — ABNORMAL LOW (ref 20.0–50.0)
TRANSFERRIN: 222 mg/dL (ref 212.0–360.0)

## 2016-05-13 LAB — HEPATIC FUNCTION PANEL
ALBUMIN: 3.6 g/dL (ref 3.5–5.2)
ALT: 9 U/L (ref 0–35)
AST: 13 U/L (ref 0–37)
Alkaline Phosphatase: 76 U/L (ref 39–117)
Bilirubin, Direct: 0.2 mg/dL (ref 0.0–0.3)
TOTAL PROTEIN: 6.8 g/dL (ref 6.0–8.3)
Total Bilirubin: 0.7 mg/dL (ref 0.2–1.2)

## 2016-05-13 LAB — BASIC METABOLIC PANEL
BUN: 11 mg/dL (ref 6–23)
CO2: 28 meq/L (ref 19–32)
Calcium: 8.8 mg/dL (ref 8.4–10.5)
Chloride: 108 mEq/L (ref 96–112)
Creatinine, Ser: 0.86 mg/dL (ref 0.40–1.20)
GFR: 81.15 mL/min (ref >60.00)
GLUCOSE: 129 mg/dL — AB (ref 70–99)
POTASSIUM: 4.3 meq/L (ref 3.5–5.1)
SODIUM: 140 meq/L (ref 135–145)

## 2016-05-13 LAB — TSH: TSH: 3.6 u[IU]/mL (ref 0.35–4.50)

## 2016-05-13 LAB — LIPID PANEL
CHOLESTEROL: 141 mg/dL (ref 0–200)
HDL: 47.8 mg/dL (ref >39.00)
LDL CALC: 79 mg/dL (ref 0–99)
NonHDL: 92.73
TRIGLYCERIDES: 68 mg/dL (ref 0.0–149.0)
Total CHOL/HDL Ratio: 3
VLDL: 13.6 mg/dL (ref 0.0–40.0)

## 2016-05-13 LAB — HEMOGLOBIN A1C: HEMOGLOBIN A1C: 6.2 % (ref 4.6–6.5)

## 2016-05-13 LAB — VITAMIN B12: Vitamin B-12: 676 pg/mL (ref 211–911)

## 2016-05-13 NOTE — Patient Instructions (Signed)
OK to take OTC Miralax every day for constipation  Please continue all other medications as before, and refills have been done if requested.  Please have the pharmacy call with any other refills you may need.  Please continue your efforts at being more active, low cholesterol diet, and weight control.  You are otherwise up to date with prevention measures today.  Please keep your appointments with your specialists as you may have planned  Please go to the LAB in the Basement (turn left off the elevator) for the tests to be done today  You will be contacted by phone if any changes need to be made immediately.  Otherwise, you will receive a letter about your results with an explanation, but please check with MyChart first.  Please remember to sign up for MyChart if you have not done so, as this will be important to you in the future with finding out test results, communicating by private email, and scheduling acute appointments online when needed.  If you have Medicare related insurance (such as traditoinal Medicare, [employer] or [employer], or similar), Please make an appointment at the Scheduling desk with Selena Batten, the [employer], for your Wellness Visit in this office, which is a benefit with your insurance.  Please return in 6 months, or sooner if needed

## 2016-05-13 NOTE — Assessment & Plan Note (Signed)
stable overall by history and exam, recent data reviewed with pt, and pt to continue medical treatment as before,  to f/u any worsening symptoms or concerns Lab Results  Component Value Date   HGBA1C 6.3 05/14/2015   For f/u today

## 2016-05-13 NOTE — Assessment & Plan Note (Signed)
Mild to mod recurrent, for miralax daily

## 2016-05-13 NOTE — Assessment & Plan Note (Signed)

## 2016-05-13 NOTE — Assessment & Plan Note (Signed)
Mild stable, for f/u lab today

## 2016-05-13 NOTE — Progress Notes (Signed)
Subjective:    Patient ID: Deanna Wall, female    DOB: 02-08-1932, 83 y.o.   MRN: [id number]  HPI  Here for wellness and f/u;  Overall doing ok;  Pt denies Chest pain, worsening SOB, DOE, wheezing, orthopnea, PND, worsening LE edema, palpitations, dizziness or syncope.  Pt denies neurological change such as new headache, or worsening facial or extremity weakness, and has nuerology f/u appt later this month.   Pt denies polydipsia, polyuria, or low sugar symptoms. Pt states overall good compliance with treatment and medications, good tolerability, and has been trying to follow appropriate diet.  Pt denies worsening depressive symptoms, suicidal ideation or panic. No fever, night sweats, wt loss, loss of appetite, or other constitutional symptoms.  Pt states persistent mod disability with ADL's and husband able to help,  has high fall risk, but home safety reviewed and adequate, no other significant changes in hearing or vision, and not active with exercise as cannot ambulate well at all due to the MS.  Does not want to try up on exam table today  Declines dxa as not able to get up on table for exam  No other new history except for recurring mild to mod constipation. Past Medical History:  Diagnosis Date  . ANEMIA, PERNICIOUS 01/30/2009  . Cataracts, bilateral   . DEGENERATIVE DISC DISEASE 10/28/2006  . ECZEMA 10/28/2006  . Gait disorder   . Glaucoma   . HYPERLIPIDEMIA 02/16/2010  . HYPERTENSION 10/28/2006  . Impaired glucose tolerance 12/31/2010  . MS (multiple sclerosis) (HCC)   . Multiple sclerosis (HCC) 10/28/2006  . Obesity   . OSTEOARTHRITIS, KNEES, BILATERAL, SEVERE 06/14/2009  . Sebaceous cyst    chest   . VITAMIN B12 DEFICIENCY 06/14/2009   Past Surgical History:  Procedure Laterality Date  . COLONOSCOPY    . TUBAL LIGATION  1958  . UPPER GASTROINTESTINAL ENDOSCOPY      reports that she has never smoked. She has never used smokeless tobacco. She reports that she does not drink alcohol  or use drugs. family history includes Arthritis in her mother and sister; Bone cancer in her mother; Cancer in her mother; Diabetes in her sister. Allergies  Allergen Reactions  . Atorvastatin     REACTION: feels funny in face and head   Current Outpatient Prescriptions on File Prior to Visit  Medication Sig Dispense Refill  . amLODipine (NORVASC) 5 MG tablet Take 1 tablet (5 mg total) by mouth daily. 90 tablet 3  . aspirin 81 MG tablet Take 81 mg by mouth daily.      . cloNIDine (CATAPRES) 0.3 MG tablet TAKE ONE TABLET BY MOUTH TWICE DAILY 180 tablet 0  . furosemide (LASIX) 20 MG tablet TAKE 1 TABLET (20 MG TOTAL) BY MOUTH DAILY AS NEEDED FOR SWELLING 90 tablet 3  . latanoprost (XALATAN) 0.005 % ophthalmic solution INSTILL 1 DROP IN BOTH EYES AT BEDTIME. KEEP THE BOTTLE IN THE FRIDGE UNTIL YOU OPEN IT.  3  . levocetirizine (XYZAL) 5 MG tablet Take 1 tablet (5 mg total) by mouth every evening. 30 tablet 11  . mupirocin cream (BACTROBAN) 2 % Apply 1 application topically 2 (two) times daily. 15 g 0  . nitrofurantoin (MACRODANTIN) 50 MG capsule TAKE ONE CAPSULE BY MOUTH ONCE DAILY 90 capsule 0  . simvastatin (ZOCOR) 40 MG tablet Take 1 tablet (40 mg total) by mouth every evening. 90 tablet 3  . timolol (TIMOPTIC) 0.5 % ophthalmic solution INSTILL ONE DROP IN [facility] EYE ONCE  DAILY IN THE MORNING  4  . vitamin B-12 (CYANOCOBALAMIN) 500 MCG tablet Take 500 mcg by mouth daily.     No current facility-administered medications on file prior to visit.    Review of Systems Constitutional: Negative for increased diaphoresis, or other activity, appetite or siginficant weight change other than noted HENT: Negative for worsening hearing loss, ear pain, facial swelling, mouth sores and neck stiffness.   Eyes: Negative for other worsening pain, redness or visual disturbance.  Respiratory: Negative for choking or stridor Cardiovascular: Negative for other chest pain and palpitations.  Gastrointestinal:  Negative for worsening diarrhea, blood in stool, or abdominal distention Genitourinary: Negative for hematuria, flank pain or change in urine volume.  Musculoskeletal: Negative for myalgias or other joint complaints.  Skin: Negative for other color change and wound or drainage.  Neurological: Negative for syncope and numbness. other than noted Hematological: Negative for adenopathy. or other swelling Psychiatric/Behavioral: Negative for hallucinations, SI, self-injury, decreased concentration or other worsening agitation.  All other system neg per pt    Objective:   Physical Exam BP 110/64   Pulse (!) 48   Temp 97.4 F (36.3 C)   Ht 5\' 7"  (1.702 m)   Wt 258 lb (117 kg) Comment: with wheelchair  SpO2 97%   BMI 40.41 kg/m  VS noted,  Constitutional: Pt is oriented to person, place, and time. Appears well-developed and well-nourished, in no significant distress Head: Normocephalic and atraumatic  Eyes: Conjunctivae and EOM are normal. Pupils are equal, round, and reactive to light Right Ear: External ear normal.  Left Ear: External ear normal Nose: Nose normal.  Mouth/Throat: Oropharynx is clear and moist  Neck: Normal range of motion. Neck supple. No JVD present. No tracheal deviation present or significant neck LA or mass Cardiovascular: Normal rate, regular rhythm, normal heart sounds and intact distal pulses.   Pulmonary/Chest: Effort normal and breath sounds without rales or wheezing  Abdominal: Soft. Bowel sounds are normal. NT. No HSM  Musculoskeletal: Normal range of motion. Exhibits no edema Lymphadenopathy: Has no cervical adenopathy.  Neurological: Pt is alert and oriented to person, place, and time. Pt has normal reflexes. No cranial nerve deficit. Ow not done in detail Skin: Skin is warm and dry. No rash noted or new ulcers Psychiatric:  Has normal mood and affect. Behavior is normal.   Lab Results  Component Value Date   WBC 5.9 05/14/2015   HGB 11.5 (L) 05/14/2015    HCT 34.4 (L) 05/14/2015   PLT 319.0 05/14/2015   GLUCOSE 107 (H) 05/14/2015   CHOL 148 05/14/2015   TRIG 85.0 05/14/2015   HDL 50.90 05/14/2015   LDLDIRECT 180.0 01/29/2010   LDLCALC 80 05/14/2015   ALT 10 05/14/2015   AST 15 05/14/2015   NA 142 05/14/2015   K 4.1 05/14/2015   CL 107 05/14/2015   CREATININE 0.85 05/14/2015   BUN 14 05/14/2015   CO2 30 05/14/2015   TSH 2.49 05/14/2015   HGBA1C 6.3 05/14/2015       Assessment & Plan:

## 2016-05-13 NOTE — Assessment & Plan Note (Signed)
For neuro f/u as planned 

## 2016-05-14 ENCOUNTER — Other Ambulatory Visit: Payer: Self-pay | Admitting: Internal Medicine

## 2016-05-14 NOTE — Telephone Encounter (Signed)
Done erx 

## 2016-05-14 NOTE — Telephone Encounter (Signed)
Routing to dr john, please advise, thanks 

## 2016-05-22 ENCOUNTER — Encounter: Payer: Self-pay | Admitting: Neurology

## 2016-05-22 ENCOUNTER — Ambulatory Visit (INDEPENDENT_AMBULATORY_CARE_PROVIDER_SITE_OTHER): Payer: Medicare Other | Admitting: Neurology

## 2016-05-22 VITALS — BP 128/64 | HR 48 | Ht 67.0 in

## 2016-05-22 DIAGNOSIS — G35 Multiple sclerosis: Secondary | ICD-10-CM | POA: Diagnosis not present

## 2016-05-22 NOTE — Progress Notes (Signed)
Reason for visit: Multiple sclerosis  Deanna Wall is an 83 y.o. female  History of present illness:  Deanna Wall is an 83 year old right-handed black female with a history of multiple sclerosis with a chronic progressive course. The patient was initially diagnosed in 1974 with multiple sclerosis when she presented with a gait disorder. The patient has essentially lost her ability to ambulate, she can stand for transfers, she may have an occasional fall. She can get herself up if she crawls to something and pulls herself up. She has fairly good strength in the arms. She has plenty of mobility equipment at home including a standard wheelchair, motorized wheelchair, walker, bedside commode, and shower chair. The patient also has a lift chair. The patient feels that she can have fairly good mobility inside her own home. She denies any new symptoms of numbness, weakness, vision changes, or changes in bowel or bladder control. The patient returns to this office for an evaluation. She is not on any disease modifying medications. She does report some constipation, she will be going on MiraLAX for this.  Past Medical History:  Diagnosis Date  . ANEMIA, PERNICIOUS 01/30/2009  . Cataracts, bilateral   . DEGENERATIVE DISC DISEASE 10/28/2006  . ECZEMA 10/28/2006  . Gait disorder   . Glaucoma   . HYPERLIPIDEMIA 02/16/2010  . HYPERTENSION 10/28/2006  . Impaired glucose tolerance 12/31/2010  . MS (multiple sclerosis) (HCC)   . Multiple sclerosis (HCC) 10/28/2006  . Obesity   . OSTEOARTHRITIS, KNEES, BILATERAL, SEVERE 06/14/2009  . Sebaceous cyst    chest   . VITAMIN B12 DEFICIENCY 06/14/2009    Past Surgical History:  Procedure Laterality Date  . COLONOSCOPY    . TUBAL LIGATION  1958  . UPPER GASTROINTESTINAL ENDOSCOPY      Family History  Problem Relation Age of Onset  . Bone cancer Mother   . Arthritis Mother   . Cancer Mother     bone  . Diabetes Sister   . Arthritis Sister     Social  history:  reports that she has never smoked. She has never used smokeless tobacco. She reports that she does not drink alcohol or use drugs.    Allergies  Allergen Reactions  . Atorvastatin     REACTION: feels funny in face and head    Medications:  Prior to Admission medications   Medication Sig Start Date End Date Taking? Authorizing Provider  amLODipine (NORVASC) 5 MG tablet Take 1 tablet (5 mg total) by mouth daily. 09/11/15  Yes Corwin Levins, MD  aspirin 81 MG tablet Take 81 mg by mouth daily.     Yes Historical Provider, MD  cloNIDine (CATAPRES) 0.3 MG tablet TAKE ONE TABLET BY MOUTH TWICE DAILY 04/01/16  Yes Corwin Levins, MD  furosemide (LASIX) 20 MG tablet TAKE 1 TABLET (20 MG TOTAL) BY MOUTH DAILY AS NEEDED FOR SWELLING 11/13/15  Yes Corwin Levins, MD  latanoprost (XALATAN) 0.005 % ophthalmic solution INSTILL 1 DROP IN BOTH EYES AT BEDTIME. KEEP THE BOTTLE IN THE FRIDGE UNTIL YOU OPEN IT. 11/08/14  Yes Historical Provider, MD  levocetirizine (XYZAL) 5 MG tablet Take 1 tablet (5 mg total) by mouth every evening. 05/16/15  Yes Corwin Levins, MD  mupirocin cream (BACTROBAN) 2 % Apply 1 application topically 2 (two) times daily. 09/11/15  Yes Corwin Levins, MD  nitrofurantoin (MACRODANTIN) 50 MG capsule TAKE ONE CAPSULE BY MOUTH ONCE DAILY 05/14/16  Yes Corwin Levins, MD  simvastatin (ZOCOR) 40 MG tablet Take 1 tablet (40 mg total) by mouth every evening. 09/11/15  Yes Corwin Levins, MD  timolol (TIMOPTIC) 0.5 % ophthalmic solution INSTILL ONE DROP IN [facility] EYE ONCE DAILY IN THE MORNING 02/15/15  Yes Historical Provider, MD  vitamin B-12 (CYANOCOBALAMIN) 500 MCG tablet Take 500 mcg by mouth daily.   Yes Historical Provider, MD    ROS:  Out of a complete 14 system review of symptoms, the patient complains only of the following symptoms, and all other reviewed systems are negative.  Constipation Leg swelling Joint pain, walking difficulty Moles  Blood pressure 128/64, pulse (!) 48, height 5'  7" (1.702 m).  Physical Exam  General: The patient is alert and cooperative at the time of the examination.  Neuromuscular: The patient is unable to fully extend her legs at the knee, she lacks about 15 or 20 of full extension of both knees.  Skin: 1+ edema at ankles is noted bilaterally.   Neurologic Exam  Mental status: The patient is alert and oriented x 3 at the time of the examination. The patient has apparent normal recent and remote memory, with an apparently normal attention span and concentration ability.   Cranial nerves: Facial symmetry is present. Speech is normal, no aphasia or dysarthria is noted. Extraocular movements are full. Visual fields are full. Pupils are equal, round, and reactive to light. Discs are flat bilaterally.  Motor: The patient has good strength in the upper extremities. With the lower extremities, the patient has 4/5 strength with flexion at the hips, the right side is slightly weaker. The patient is relatively good strength with flexion and extension at the knees, she may have a mild right foot drop, good strength with dorsiflexion of the left foot.  Sensory examination: Soft touch sensation is symmetric on the face, arms, and legs.  Coordination: The patient has good finger-nose-finger and heel-to-shin bilaterally.  Gait and station: The patient is wheelchair-bound, she was not ambulated.  Reflexes: Deep tendon reflexes are symmetric, but are depressed.   Assessment/Plan:  1. Multiple sclerosis  2. Gait disorder  The patient has had multiple sclerosis since 1974 with a slowly progressive course. The patient is not on any disease modifying agents. She is not getting any medications through this office. I will have the patient come back on an as-needed basis, she may call for any questions or problems.  Marlan Palau MD 05/22/2016 12:37 PM  [facility] [address] [place], [place] [address]  Phone  [phone] Fax [phone]

## 2016-05-25 ENCOUNTER — Other Ambulatory Visit: Payer: Self-pay | Admitting: Internal Medicine

## 2016-07-02 ENCOUNTER — Other Ambulatory Visit: Payer: Self-pay | Admitting: Internal Medicine

## 2016-07-23 DIAGNOSIS — Z1231 Encounter for screening mammogram for malignant neoplasm of breast: Secondary | ICD-10-CM | POA: Diagnosis not present

## 2016-07-23 LAB — HM MAMMOGRAPHY

## 2016-08-04 DIAGNOSIS — H11423 Conjunctival edema, bilateral: Secondary | ICD-10-CM | POA: Diagnosis not present

## 2016-08-04 DIAGNOSIS — H401211 Low-tension glaucoma, right eye, mild stage: Secondary | ICD-10-CM | POA: Diagnosis not present

## 2016-08-04 DIAGNOSIS — H47233 Glaucomatous optic atrophy, bilateral: Secondary | ICD-10-CM | POA: Diagnosis not present

## 2016-08-04 DIAGNOSIS — H401222 Low-tension glaucoma, left eye, moderate stage: Secondary | ICD-10-CM | POA: Diagnosis not present

## 2016-09-03 ENCOUNTER — Telehealth: Payer: Self-pay

## 2016-09-03 ENCOUNTER — Telehealth: Payer: Self-pay | Admitting: Internal Medicine

## 2016-09-03 NOTE — Telephone Encounter (Signed)
Yes ma'am I just received the fax in the back also.

## 2016-09-03 NOTE — Telephone Encounter (Signed)
Approved 09/03/16-09/03/17 Key [id number]

## 2016-09-03 NOTE — Telephone Encounter (Signed)
Just FYI-Pts nitrofurantoin (MACRODANTIN) 50 MG capsule Was approved for one year effective today

## 2016-09-28 ENCOUNTER — Other Ambulatory Visit: Payer: Self-pay | Admitting: Internal Medicine

## 2016-10-07 ENCOUNTER — Encounter: Payer: Self-pay | Admitting: Internal Medicine

## 2016-10-07 ENCOUNTER — Ambulatory Visit (INDEPENDENT_AMBULATORY_CARE_PROVIDER_SITE_OTHER): Payer: Medicare Other | Admitting: Internal Medicine

## 2016-10-07 VITALS — BP 116/72 | HR 63 | Ht 67.0 in

## 2016-10-07 DIAGNOSIS — R42 Dizziness and giddiness: Secondary | ICD-10-CM

## 2016-10-07 DIAGNOSIS — K5900 Constipation, unspecified: Secondary | ICD-10-CM

## 2016-10-07 DIAGNOSIS — R112 Nausea with vomiting, unspecified: Secondary | ICD-10-CM | POA: Diagnosis not present

## 2016-10-07 DIAGNOSIS — E785 Hyperlipidemia, unspecified: Secondary | ICD-10-CM

## 2016-10-07 MED ORDER — MECLIZINE HCL 12.5 MG PO TABS: 12.5000 mg | ORAL_TABLET | Freq: Three times a day (TID) | ORAL | 1 refills | Status: AC | PRN

## 2016-10-07 MED ORDER — MECLIZINE HCL 12.5 MG PO TABS: 12.5000 mg | ORAL_TABLET | Freq: Three times a day (TID) | ORAL | 1 refills | Status: DC | PRN

## 2016-10-07 MED ORDER — EZETIMIBE 10 MG PO TABS: 10.0000 mg | ORAL_TABLET | Freq: Every day | ORAL | 3 refills | Status: DC

## 2016-10-07 NOTE — Patient Instructions (Signed)
Please take all new medication as prescribed - the zetia 10 mg for cholesterol, and the Meclizine as needed for dizziness  OK to take Ginger Cubes with you to avoid sea sickness on your cruise next month  OK to stop the simvastatin (zocor) as you have  Please continue all other medications as before, and refills have been done if requested.  Please have the pharmacy call with any other refills you may need.  Please continue your efforts at being more active, low cholesterol diet, and weight control.  Please keep your appointments with your specialists as you may have planned

## 2016-10-10 DIAGNOSIS — R112 Nausea with vomiting, unspecified: Secondary | ICD-10-CM | POA: Insufficient documentation

## 2016-10-10 DIAGNOSIS — R42 Dizziness and giddiness: Secondary | ICD-10-CM | POA: Insufficient documentation

## 2016-10-10 NOTE — Assessment & Plan Note (Signed)
Ok for meclizine as pt requested,  to f/u any worsening symptoms or concerns

## 2016-10-10 NOTE — Assessment & Plan Note (Signed)
D/w pt, has not been taking regular laxative, ok to restart the miralax asd, cont prune juice prn as she does

## 2016-10-10 NOTE — Assessment & Plan Note (Signed)
Pt not willing to continue statin, ok for change to zetia 10 qd,  to f/u any worsening symptoms or concerns

## 2016-10-10 NOTE — Assessment & Plan Note (Signed)
Transient episode, I suspect likely related to constipation but unable to convince pt that is was not her statin since she has been taking the statin for some time, now resolved apparently, exam benign, ok to follow

## 2016-10-10 NOTE — Progress Notes (Signed)
Subjective:    Patient ID: Deanna Wall, female    DOB: 1931-07-09, 83 y.o.   MRN: [id number]  HPI  Here to f/u, c/o experiencing an episode of several hours n/v yesterday which seemed to have resolved but she is convinced it was the zocor so does not want to take anymore.  Denies worsening reflux, abd pain, dysphagia, or blood but has worsening constipation symptoms in the past few wks without diet change.  Pt denies chest pain, increased sob or doe, wheezing, orthopnea, PND, increased LE swelling, palpitations, or syncope, but has had intermittent vertigo like dizziness, asks for tx.   Pt denies fever, wt loss, night sweats, loss of appetite, or other constitutional symptoms  Pt denies new neurological symptoms such as new headache, or new facial or extremity weakness or numbness . Remains wheelchair bound Past Medical History:  Diagnosis Date  . ANEMIA, PERNICIOUS 01/30/2009  . Cataracts, bilateral   . DEGENERATIVE DISC DISEASE 10/28/2006  . ECZEMA 10/28/2006  . Gait disorder   . Glaucoma   . HYPERLIPIDEMIA 02/16/2010  . HYPERTENSION 10/28/2006  . Impaired glucose tolerance 12/31/2010  . MS (multiple sclerosis) (HCC)   . Multiple sclerosis (HCC) 10/28/2006  . Obesity   . OSTEOARTHRITIS, KNEES, BILATERAL, SEVERE 06/14/2009  . Sebaceous cyst    chest   . VITAMIN B12 DEFICIENCY 06/14/2009   Past Surgical History:  Procedure Laterality Date  . COLONOSCOPY    . TUBAL LIGATION  1958  . UPPER GASTROINTESTINAL ENDOSCOPY      reports that she has never smoked. She has never used smokeless tobacco. She reports that she does not drink alcohol or use drugs. family history includes Arthritis in her mother and sister; Bone cancer in her mother; Cancer in her mother; Diabetes in her sister. Allergies  Allergen Reactions  . Atorvastatin     REACTION: feels funny in face and head   Current Outpatient Prescriptions on File Prior to Visit  Medication Sig Dispense Refill  . amLODipine (NORVASC) 5 MG  tablet Take 1 tablet (5 mg total) by mouth daily. 90 tablet 3  . aspirin 81 MG tablet Take 81 mg by mouth daily.      . cloNIDine (CATAPRES) 0.3 MG tablet TAKE ONE TABLET BY MOUTH TWICE DAILY 180 tablet 1  . furosemide (LASIX) 20 MG tablet TAKE 1 TABLET (20 MG TOTAL) BY MOUTH DAILY AS NEEDED FOR SWELLING 90 tablet 3  . latanoprost (XALATAN) 0.005 % ophthalmic solution INSTILL 1 DROP IN BOTH EYES AT BEDTIME. KEEP THE BOTTLE IN THE FRIDGE UNTIL YOU OPEN IT.  3  . levocetirizine (XYZAL) 5 MG tablet TAKE ONE TABLET BY MOUTH ONCE DAILY IN THE EVENING 90 tablet 3  . mupirocin cream (BACTROBAN) 2 % Apply 1 application topically 2 (two) times daily. 15 g 0  . nitrofurantoin (MACRODANTIN) 50 MG capsule TAKE ONE CAPSULE BY MOUTH ONCE DAILY 90 capsule 3  . timolol (TIMOPTIC) 0.5 % ophthalmic solution INSTILL ONE DROP IN EACH EYE ONCE DAILY IN THE MORNING  4  . vitamin B-12 (CYANOCOBALAMIN) 500 MCG tablet Take 500 mcg by mouth daily.     No current facility-administered medications on file prior to visit.    Review of Systems  Constitutional: Negative for other unusual diaphoresis or sweats HENT: Negative for ear discharge or swelling Eyes: Negative for other worsening visual disturbances Respiratory: Negative for stridor or other swelling  Gastrointestinal: Negative for worsening distension or other blood Genitourinary: Negative for retention or other urinary  change Musculoskeletal: Negative for other MSK pain or swelling Skin: Negative for color change or other new lesions Neurological: Negative for worsening tremors and other numbness  Psychiatric/Behavioral: Negative for worsening agitation or other fatigue All other system neg per pt    Objective:   Physical Exam BP 116/72   Pulse 63   Ht 5\' 7"  (1.702 m)   SpO2 99%  VS noted,  Constitutional: Pt appears in NAD HENT: Head: NCAT.  Right Ear: External ear normal.  Left Ear: External ear normal.  Eyes: . Pupils are equal, round, and  reactive to light. Conjunctivae and EOM are normal Nose: without d/c or deformity Neck: Neck supple. Gross normal ROM Cardiovascular: Normal rate and regular rhythm.   Pulmonary/Chest: Effort normal and breath sounds without rales or wheezing.  Abd:  Soft, NT, ND, + BS, no organomegaly - benign exam Neurological: Pt is alert. At baseline orientation, motor grossly intact Skin: Skin is warm. No rashes, other new lesions, no LE edema Psychiatric: Pt behavior is normal without agitation , mild nervous No other exam findings    Assessment & Plan:

## 2016-11-03 NOTE — Progress Notes (Addendum)
Subjective:   Deanna Wall is a 83 y.o. female who presents for Medicare Annual (Subsequent) preventive examination.  Review of Systems:  No ROS.  Medicare Wellness Visit. Additional risk factors are reflected in the social history.  Cardiac Risk Factors include: advanced age (>57men, >66 women);dyslipidemia;hypertension;obesity (BMI >30kg/m2);sedentary lifestyle Sleep patterns: feels rested on waking, gets up 1 times nightly to void and sleeps 6-7 hours nightly.    Home Safety/Smoke Alarms: Feels safe in home. Smoke alarms in place.  Living environment; residence and Firearm Safety: 1-story house/ trailer, equipment: Walkers, Type: Agricultural consultant, Development worker, international aid, Type: Tub Dentist and wheelchair, no firearms. Seat Belt Safety/Bike Helmet: Wears seat belt.   Counseling:   Eye Exam- appointment every 6 months Dental- appointment every 6 months  Female:   Mammo- Last 07/23/16,  BI-RADS category 1: negative       Dexa scan- N/D, declined referral         Objective:     Vitals: BP 126/72   Pulse 60   Temp 97.9 F (36.6 C)   Ht 5\' 7"  (1.702 m)   SpO2 98%   There is no height or weight on file to calculate BMI.   Tobacco History  Smoking Status  . Never Smoker  Smokeless Tobacco  . Never Used     Counseling given: Not Answered   Past Medical History:  Diagnosis Date  . ANEMIA, PERNICIOUS 01/30/2009  . Cataracts, bilateral   . DEGENERATIVE DISC DISEASE 10/28/2006  . ECZEMA 10/28/2006  . Gait disorder   . Glaucoma   . HYPERLIPIDEMIA 02/16/2010  . HYPERTENSION 10/28/2006  . Impaired glucose tolerance 12/31/2010  . MS (multiple sclerosis) (HCC)   . Multiple sclerosis (HCC) 10/28/2006  . Obesity   . OSTEOARTHRITIS, KNEES, BILATERAL, SEVERE 06/14/2009  . Sebaceous cyst    chest   . VITAMIN B12 DEFICIENCY 06/14/2009   Past Surgical History:  Procedure Laterality Date  . COLONOSCOPY    . TUBAL LIGATION  1958  . UPPER GASTROINTESTINAL ENDOSCOPY     Family History    Problem Relation Age of Onset  . Bone cancer Mother   . Arthritis Mother   . Cancer Mother        bone  . Diabetes Sister   . Arthritis Sister    History  Sexual Activity  . Sexual activity: Not on file    Outpatient Encounter Prescriptions as of 11/04/2016  Medication Sig  . amLODipine (NORVASC) 5 MG tablet Take 1 tablet (5 mg total) by mouth daily.  Marland Kitchen aspirin 81 MG tablet Take 81 mg by mouth daily.    . cloNIDine (CATAPRES) 0.3 MG tablet TAKE ONE TABLET BY MOUTH TWICE DAILY  . ezetimibe (ZETIA) 10 MG tablet Take 1 tablet (10 mg total) by mouth daily.  . furosemide (LASIX) 20 MG tablet TAKE 1 TABLET (20 MG TOTAL) BY MOUTH DAILY AS NEEDED FOR SWELLING  . latanoprost (XALATAN) 0.005 % ophthalmic solution INSTILL 1 DROP IN BOTH EYES AT BEDTIME. KEEP THE BOTTLE IN THE FRIDGE UNTIL YOU OPEN IT.  Marland Kitchen levocetirizine (XYZAL) 5 MG tablet TAKE ONE TABLET BY MOUTH ONCE DAILY IN THE EVENING  . meclizine (ANTIVERT) 12.5 MG tablet Take 1 tablet (12.5 mg total) by mouth 3 (three) times daily as needed for dizziness.  . nitrofurantoin (MACRODANTIN) 50 MG capsule TAKE ONE CAPSULE BY MOUTH ONCE DAILY  . timolol (TIMOPTIC) 0.5 % ophthalmic solution INSTILL ONE DROP IN [facility] EYE ONCE DAILY IN THE MORNING  .  vitamin B-12 (CYANOCOBALAMIN) 500 MCG tablet Take 500 mcg by mouth daily.  . [DISCONTINUED] mupirocin cream (BACTROBAN) 2 % Apply 1 application topically 2 (two) times daily.   No facility-administered encounter medications on file as of 11/04/2016.     Activities of Daily Living In your present state of health, do you have any difficulty performing the following activities: 11/04/2016  Hearing? N  Vision? N  Difficulty concentrating or making decisions? N  Walking or climbing stairs? Y  Dressing or bathing? Y  Doing errands, shopping? Y  Preparing Food and eating ? N  Using the Toilet? N  In the past six months, have you accidently leaked urine? N  Do you have problems with loss of bowel  control? N  Managing your Medications? N  Managing your Finances? N  Housekeeping or managing your Housekeeping? Y  Some recent data might be hidden    Patient Care Team: Corwin Levins, MD as PCP - General    Assessment:    Physical assessment deferred to PCP.  Exercise Activities and Dietary recommendations Current Exercise Habits: Home exercise routine (chair exercise pamphlets provided), Time (Minutes): 20, Frequency (Times/Week): 3, Weekly Exercise (Minutes/Week): 60, Intensity: Mild, Exercise limited by: orthopedic condition(s)  Diet (meal preparation, eat out, water intake, caffeinated beverages, dairy products, fruits and vegetables): in general, a "healthy" diet  , well balanced, eats a variety of fruits and vegetables daily, limits salt, fat/cholesterol, sugar, caffeine, drinks 6-8 glasses of water daily.  Reviewed heart healthy and weight loss tips, relevant patient education assigned to patient using Emmi.   Goals    . I want to lose weight          I will decrease the amount of sweets I eat and increase my physical activity      Fall Risk Fall Risk  11/04/2016 05/13/2016 05/16/2015 05/15/2014 05/12/2013  Falls in the past year? No Yes No No No  Number falls in past yr: - 1 - - -  Injury with Fall? - No - - -  Risk for fall due to : Impaired balance/gait;Impaired mobility - - - -   Depression Screen PHQ 2/9 Scores 11/04/2016 05/13/2016 05/16/2015 05/15/2014  PHQ - 2 Score 0 0 1 0     Cognitive Function MMSE - Mini Mental State Exam 11/04/2016  Orientation to time 5  Orientation to Place 5  Registration 3  Attention/ Calculation 5  Recall 2  Language- name 2 objects 2  Language- repeat 1  Language- follow 3 step command 3  Language- read & follow direction 1  Write a sentence 1  Copy design 1  Total score 29        Immunization History  Administered Date(s) Administered  . Influenza Split 12/31/2010, 12/07/2011  . Influenza Whole 03/23/2005, 12/09/2007,  12/17/2008, 02/04/2010  . Influenza,inj,Quad PF,36+ Mos 12/15/2012, 11/09/2013, 12/25/2014, 11/13/2015  . Pneumococcal Conjugate-13 05/12/2013  . Pneumococcal Polysaccharide-23 03/23/2005, 12/31/2010  . Td 03/24/1999, 02/04/2010  . Zoster 12/09/2007   Screening Tests Health Maintenance  Topic Date Due  . DEXA SCAN  11/26/1998  . INFLUENZA VACCINE  10/21/2016  . TETANUS/TDAP  02/05/2020  . PNA vac Low Risk Adult  Completed      Plan:    Continue doing brain stimulating activities (puzzles, reading, adult coloring books, staying active) to keep memory sharp.   Continue to eat heart healthy diet (full of fruits, vegetables, whole grains, lean protein, water--limit salt, fat, and sugar intake) and increase physical activity as  tolerated.  I have personally reviewed and noted the following in the patient's chart:   . Medical and social history . Use of alcohol, tobacco or illicit drugs  . Current medications and supplements . Functional ability and status . Nutritional status . Physical activity . Advanced directives . List of other physicians . Vitals . Screenings to include cognitive, depression, and falls . Referrals and appointments  In addition, I have reviewed and discussed with patient certain preventive protocols, quality metrics, and best practice recommendations. A written personalized care plan for preventive services as well as general preventive health recommendations were provided to patient.     Wanda Plump, RN  11/04/2016   Medical screening examination/treatment/procedure(s) were performed by non-physician practitioner and as supervising physician I was immediately available for consultation/collaboration. I agree with above. Oliver Barre, MD

## 2016-11-03 NOTE — Progress Notes (Signed)
Pre visit review using our clinic review tool, if applicable. No additional management support is needed unless otherwise documented below in the visit note. 

## 2016-11-04 ENCOUNTER — Other Ambulatory Visit (INDEPENDENT_AMBULATORY_CARE_PROVIDER_SITE_OTHER): Payer: Medicare Other

## 2016-11-04 ENCOUNTER — Ambulatory Visit (INDEPENDENT_AMBULATORY_CARE_PROVIDER_SITE_OTHER): Payer: Medicare Other | Admitting: Internal Medicine

## 2016-11-04 ENCOUNTER — Encounter: Payer: Self-pay | Admitting: Internal Medicine

## 2016-11-04 VITALS — BP 126/72 | HR 60 | Temp 97.9°F | Ht 67.0 in

## 2016-11-04 DIAGNOSIS — D509 Iron deficiency anemia, unspecified: Secondary | ICD-10-CM

## 2016-11-04 DIAGNOSIS — Z0000 Encounter for general adult medical examination without abnormal findings: Secondary | ICD-10-CM | POA: Diagnosis not present

## 2016-11-04 DIAGNOSIS — I10 Essential (primary) hypertension: Secondary | ICD-10-CM

## 2016-11-04 DIAGNOSIS — E785 Hyperlipidemia, unspecified: Secondary | ICD-10-CM | POA: Diagnosis not present

## 2016-11-04 DIAGNOSIS — R7302 Impaired glucose tolerance (oral): Secondary | ICD-10-CM | POA: Diagnosis not present

## 2016-11-04 LAB — BASIC METABOLIC PANEL
BUN: 11 mg/dL (ref 6–23)
CALCIUM: 8.9 mg/dL (ref 8.4–10.5)
CO2: 30 meq/L (ref 19–32)
CREATININE: 0.7 mg/dL (ref 0.40–1.20)
Chloride: 106 mEq/L (ref 96–112)
GFR: 102.79 mL/min (ref >60.00)
GLUCOSE: 103 mg/dL — AB (ref 70–99)
Potassium: 3.4 mEq/L — ABNORMAL LOW (ref 3.5–5.1)
SODIUM: 142 meq/L (ref 135–145)

## 2016-11-04 LAB — CBC WITH DIFFERENTIAL/PLATELET
Basophils Absolute: 0.1 10*3/uL (ref 0.0–0.1)
Basophils Relative: 1.4 % (ref 0.0–3.0)
EOS PCT: 2.2 % (ref 0.0–5.0)
Eosinophils Absolute: 0.1 10*3/uL (ref 0.0–0.7)
HCT: 36.6 % (ref 36.0–46.0)
HEMOGLOBIN: 12 g/dL (ref 12.0–15.0)
LYMPHS PCT: 30.9 % (ref 12.0–46.0)
Lymphs Abs: 1.9 10*3/uL (ref 0.7–4.0)
MCHC: 32.6 g/dL (ref 30.0–36.0)
MCV: 86.4 fl (ref 78.0–100.0)
MONOS PCT: 6.8 % (ref 3.0–12.0)
Monocytes Absolute: 0.4 10*3/uL (ref 0.1–1.0)
Neutro Abs: 3.6 10*3/uL (ref 1.4–7.7)
Neutrophils Relative %: 58.7 % (ref 43.0–77.0)
Platelets: 317 10*3/uL (ref 150.0–400.0)
RBC: 4.24 Mil/uL (ref 3.87–5.11)
RDW: 14.8 % (ref 11.5–15.5)
WBC: 6.1 10*3/uL (ref 4.0–10.5)

## 2016-11-04 LAB — FERRITIN: FERRITIN: 52.3 ng/mL (ref 10.0–291.0)

## 2016-11-04 LAB — IBC PANEL
Iron: 48 ug/dL (ref 42–145)
Saturation Ratios: 14.6 % — ABNORMAL LOW (ref 20.0–50.0)
Transferrin: 235 mg/dL (ref 212.0–360.0)

## 2016-11-04 LAB — LIPID PANEL
CHOL/HDL RATIO: 4
Cholesterol: 175 mg/dL (ref 0–200)
HDL: 47.1 mg/dL (ref >39.00)
LDL Cholesterol: 101 mg/dL — ABNORMAL HIGH (ref 0–99)
NONHDL: 127.44
Triglycerides: 132 mg/dL (ref 0.0–149.0)
VLDL: 26.4 mg/dL (ref 0.0–40.0)

## 2016-11-04 LAB — HEMOGLOBIN A1C: Hgb A1c MFr Bld: 6.4 % (ref 4.6–6.5)

## 2016-11-04 NOTE — Patient Instructions (Addendum)
Please continue all other medications as before, and refills have been done if requested.  Please have the pharmacy call with any other refills you may need.  Please continue your efforts at being more active, low cholesterol diet, and weight control. Please keep your appointments with your specialists as you may have planned  Please go to the LAB in the Basement (turn left off the elevator) for the tests to be done today  You will be contacted by phone if any changes need to be made immediately.  Otherwise, you will receive a letter about your results with an explanation, but please check with MyChart first.  Please remember to sign up for MyChart if you have not done so, as this will be important to you in the future with finding out test results, communicating by private email, and scheduling acute appointments online when needed.  Please return in 6 months, or sooner if needed=  Continue doing brain stimulating activities (puzzles, reading, adult coloring books, staying active) to keep memory sharp.   Continue to eat heart healthy diet (full of fruits, vegetables, whole grains, lean protein, water--limit salt, fat, and sugar intake) and increase physical activity as tolerated.   Deanna Wall , Thank you for taking time to come for your Medicare Wellness Visit. I appreciate your ongoing commitment to your health goals. Please review the following plan we discussed and let me know if I can assist you in the future.   These are the goals we discussed: Goals    . I want to lose weight          I will decrease the amount of sweets I eat and increase my physical activity       This is a list of the screening recommended for you and due dates:  Health Maintenance  Topic Date Due  . DEXA scan (bone density measurement)  11/26/1998  . Flu Shot  10/21/2016  . Tetanus Vaccine  02/05/2020  . Pneumonia vaccines  Completed

## 2016-11-04 NOTE — Progress Notes (Signed)
Subjective:    Patient ID: Deanna Wall, female    DOB: 01-07-1932, 83 y.o.   MRN: [id number]  HPI  .Here for wellness and f/u;  Overall doing ok;  Pt denies Chest pain, worsening SOB, DOE, wheezing, orthopnea, PND, worsening LE edema, palpitations, dizziness or syncope.  Pt denies neurological change such as new headache, facial or extremity weakness.  Pt denies polydipsia, polyuria, or low sugar symptoms. Pt states overall good compliance with treatment and medications, good tolerability, and has been trying to follow appropriate diet.  Pt denies worsening depressive symptoms, suicidal ideation or panic. No fever, night sweats, wt loss, loss of appetite, or other constitutional symptoms.  Pt states good ability with ADL's, has low fall risk, home safety reviewed and adequate, no other significant changes in hearing or vision, and only occasionally active with exercise. Tolerating zetia.   Wt Readings from Last 3 Encounters:  05/13/16 258 lb (117 kg)  12/26/15 250 lb (113.4 kg)  11/13/15 250 lb (113.4 kg)  neurologically stable, has been asked per neuro to f/u if any problems.  Uses walker at home.  No new complaints Past Medical History:  Diagnosis Date  . ANEMIA, PERNICIOUS 01/30/2009  . Cataracts, bilateral   . DEGENERATIVE DISC DISEASE 10/28/2006  . ECZEMA 10/28/2006  . Gait disorder   . Glaucoma   . HYPERLIPIDEMIA 02/16/2010  . HYPERTENSION 10/28/2006  . Impaired glucose tolerance 12/31/2010  . MS (multiple sclerosis) (HCC)   . Multiple sclerosis (HCC) 10/28/2006  . Obesity   . OSTEOARTHRITIS, KNEES, BILATERAL, SEVERE 06/14/2009  . Sebaceous cyst    chest   . VITAMIN B12 DEFICIENCY 06/14/2009   Past Surgical History:  Procedure Laterality Date  . COLONOSCOPY    . TUBAL LIGATION  1958  . UPPER GASTROINTESTINAL ENDOSCOPY      reports that she has never smoked. She has never used smokeless tobacco. She reports that she does not drink alcohol or use drugs. family history includes  Arthritis in her mother and sister; Bone cancer in her mother; Cancer in her mother; Diabetes in her sister. Allergies  Allergen Reactions  . Atorvastatin     REACTION: feels funny in face and head   Current Outpatient Prescriptions on File Prior to Visit  Medication Sig Dispense Refill  . amLODipine (NORVASC) 5 MG tablet Take 1 tablet (5 mg total) by mouth daily. 90 tablet 3  . aspirin 81 MG tablet Take 81 mg by mouth daily.      . cloNIDine (CATAPRES) 0.3 MG tablet TAKE ONE TABLET BY MOUTH TWICE DAILY 180 tablet 1  . ezetimibe (ZETIA) 10 MG tablet Take 1 tablet (10 mg total) by mouth daily. 90 tablet 3  . furosemide (LASIX) 20 MG tablet TAKE 1 TABLET (20 MG TOTAL) BY MOUTH DAILY AS NEEDED FOR SWELLING 90 tablet 3  . latanoprost (XALATAN) 0.005 % ophthalmic solution INSTILL 1 DROP IN BOTH EYES AT BEDTIME. KEEP THE BOTTLE IN THE FRIDGE UNTIL YOU OPEN IT.  3  . levocetirizine (XYZAL) 5 MG tablet TAKE ONE TABLET BY MOUTH ONCE DAILY IN THE EVENING 90 tablet 3  . meclizine (ANTIVERT) 12.5 MG tablet Take 1 tablet (12.5 mg total) by mouth 3 (three) times daily as needed for dizziness. 30 tablet 1  . nitrofurantoin (MACRODANTIN) 50 MG capsule TAKE ONE CAPSULE BY MOUTH ONCE DAILY 90 capsule 3  . timolol (TIMOPTIC) 0.5 % ophthalmic solution INSTILL ONE DROP IN EACH EYE ONCE DAILY IN THE MORNING  4  .  vitamin B-12 (CYANOCOBALAMIN) 500 MCG tablet Take 500 mcg by mouth daily.     No current facility-administered medications on file prior to visit.    Review of Systems  Constitutional: Negative for other unusual diaphoresis or sweats HENT: Negative for ear discharge or swelling Eyes: Negative for other worsening visual disturbances Respiratory: Negative for stridor or other swelling  Gastrointestinal: Negative for worsening distension or other blood Genitourinary: Negative for retention or other urinary change Musculoskeletal: Negative for other MSK pain or swelling Skin: Negative for color change  or other new lesions Neurological: Negative for worsening tremors and other numbness  Psychiatric/Behavioral: Negative for worsening agitation or other fatigue All other system neg per pt    Objective:   Physical Exam BP 126/72   Pulse 60   Temp 97.9 F (36.6 C)   Ht 5\' 7"  (1.702 m)   SpO2 98%  VS noted, examined in wheelchair Constitutional: Pt appears in NAD HENT: Head: NCAT.  Right Ear: External ear normal.  Left Ear: External ear normal.  Eyes: . Pupils are equal, round, and reactive to light. Conjunctivae and EOM are normal Nose: without d/c or deformity Neck: Neck supple. Gross normal ROM Cardiovascular: Normal rate and regular rhythm.   Pulmonary/Chest: Effort normal and breath sounds without rales or wheezing.  Abd:  Soft, NT, ND, + BS, no organomegaly Neurological: Pt is alert. At baseline orientation, motor grossly intact Skin: Skin is warm. No rashes, other new lesions, no LE edema Psychiatric: Pt behavior is normal without agitation  No other exam findings  Lab Results  Component Value Date   WBC 6.2 05/13/2016   HGB 11.3 (L) 05/13/2016   HCT 34.2 (L) 05/13/2016   PLT 333.0 05/13/2016   GLUCOSE 129 (H) 05/13/2016   CHOL 141 05/13/2016   TRIG 68.0 05/13/2016   HDL 47.80 05/13/2016   LDLDIRECT 180.0 01/29/2010   LDLCALC 79 05/13/2016   ALT 9 05/13/2016   AST 13 05/13/2016   NA 140 05/13/2016   K 4.3 05/13/2016   CL 108 05/13/2016   CREATININE 0.86 05/13/2016   BUN 11 05/13/2016   CO2 28 05/13/2016   TSH 3.60 05/13/2016   HGBA1C 6.2 05/13/2016       Assessment & Plan:

## 2016-11-05 ENCOUNTER — Telehealth: Payer: Self-pay

## 2016-11-05 ENCOUNTER — Encounter: Payer: Self-pay | Admitting: Internal Medicine

## 2016-11-05 ENCOUNTER — Other Ambulatory Visit: Payer: Self-pay | Admitting: Internal Medicine

## 2016-11-05 MED ORDER — POTASSIUM CHLORIDE CRYS ER 10 MEQ PO TBCR: 10.0000 meq | EXTENDED_RELEASE_TABLET | Freq: Every day | ORAL | 0 refills | Status: DC

## 2016-11-05 NOTE — Addendum Note (Signed)
Addended by: Roney Mans on: 11/05/2016 02:40 PM   Modules accepted: Orders

## 2016-11-05 NOTE — Telephone Encounter (Signed)
-----   Message from Corwin Levins, MD sent at 11/05/2016 12:05 PM EDT ----- Left message on MyChart, pt to cont same tx except  The test results show that your current treatment is OK, except the potassium is slightly low.  This may be due to the lasix.  We should treat with a short course of potassium pills.  A new  Prescription will be sent, and you should hear from the office as well.  Medea Deines to please inform pt, I will do rx for kdur 10 qd for 5 days

## 2016-11-05 NOTE — Telephone Encounter (Signed)
Please resend Pts potassium pills to [facility] on [address]

## 2016-11-05 NOTE — Telephone Encounter (Signed)
done

## 2016-11-05 NOTE — Telephone Encounter (Signed)
Called pt, LVM.   

## 2016-11-07 NOTE — Assessment & Plan Note (Signed)
stable overall by history and exam, recent data reviewed with pt, and pt to continue medical treatment as before,  to f/u any worsening symptoms or concerns BP Readings from Last 3 Encounters:  11/04/16 126/72  10/07/16 116/72  05/22/16 128/64

## 2016-11-07 NOTE — Assessment & Plan Note (Signed)
No recent overt bleeding, for f/u lab today 

## 2016-11-07 NOTE — Assessment & Plan Note (Signed)
stable overall by history and exam, recent data reviewed with pt, and pt to continue medical treatment as before,  to f/u any worsening symptoms or concerns Lab Results  Component Value Date   LDLCALC 101 (H) 11/04/2016

## 2016-11-07 NOTE — Assessment & Plan Note (Signed)
stable overall by history and exam, recent data reviewed with pt, and pt to continue medical treatment as before,  to f/u any worsening symptoms or concerns Lab Results  Component Value Date   HGBA1C 6.4 11/04/2016

## 2016-11-27 DIAGNOSIS — H47233 Glaucomatous optic atrophy, bilateral: Secondary | ICD-10-CM | POA: Diagnosis not present

## 2016-11-27 DIAGNOSIS — H401232 Low-tension glaucoma, bilateral, moderate stage: Secondary | ICD-10-CM | POA: Diagnosis not present

## 2016-11-27 DIAGNOSIS — H04123 Dry eye syndrome of bilateral lacrimal glands: Secondary | ICD-10-CM | POA: Diagnosis not present

## 2016-11-27 DIAGNOSIS — H18413 Arcus senilis, bilateral: Secondary | ICD-10-CM | POA: Diagnosis not present

## 2016-11-30 ENCOUNTER — Other Ambulatory Visit: Payer: Self-pay | Admitting: Internal Medicine

## 2016-12-23 ENCOUNTER — Ambulatory Visit (INDEPENDENT_AMBULATORY_CARE_PROVIDER_SITE_OTHER): Payer: Medicare Other

## 2016-12-23 DIAGNOSIS — Z23 Encounter for immunization: Secondary | ICD-10-CM

## 2016-12-25 ENCOUNTER — Other Ambulatory Visit: Payer: Self-pay | Admitting: Internal Medicine

## 2016-12-30 ENCOUNTER — Other Ambulatory Visit: Payer: Self-pay | Admitting: Internal Medicine

## 2017-02-26 DIAGNOSIS — Z961 Presence of intraocular lens: Secondary | ICD-10-CM | POA: Diagnosis not present

## 2017-02-26 DIAGNOSIS — H401232 Low-tension glaucoma, bilateral, moderate stage: Secondary | ICD-10-CM | POA: Diagnosis not present

## 2017-02-26 DIAGNOSIS — H47233 Glaucomatous optic atrophy, bilateral: Secondary | ICD-10-CM | POA: Diagnosis not present

## 2017-02-26 DIAGNOSIS — H35373 Puckering of macula, bilateral: Secondary | ICD-10-CM | POA: Diagnosis not present

## 2017-03-31 ENCOUNTER — Ambulatory Visit: Payer: Medicare Other | Admitting: Internal Medicine

## 2017-03-31 ENCOUNTER — Encounter: Payer: Self-pay | Admitting: Internal Medicine

## 2017-03-31 VITALS — BP 116/68 | HR 60 | Temp 98.3°F | Ht 67.0 in

## 2017-03-31 DIAGNOSIS — I10 Essential (primary) hypertension: Secondary | ICD-10-CM

## 2017-03-31 DIAGNOSIS — R7302 Impaired glucose tolerance (oral): Secondary | ICD-10-CM

## 2017-03-31 DIAGNOSIS — R059 Cough, unspecified: Secondary | ICD-10-CM

## 2017-03-31 DIAGNOSIS — R05 Cough: Secondary | ICD-10-CM

## 2017-03-31 MED ORDER — AZITHROMYCIN 250 MG PO TABS
ORAL_TABLET | ORAL | 1 refills | Status: DC
Start: 2017-03-31 — End: 2017-05-06

## 2017-03-31 MED ORDER — HYDROCODONE-HOMATROPINE 5-1.5 MG/5ML PO SYRP: 5.0000 mL | ORAL_SOLUTION | Freq: Four times a day (QID) | ORAL | 0 refills | Status: AC | PRN

## 2017-03-31 NOTE — Patient Instructions (Addendum)
Please take all new medication as prescribed - the antibiotic, and cough medicine if needed  Please continue all other medications as before, and refills have been done if requested.  Please have the pharmacy call with any other refills you may need.  Please keep your appointments with your specialists as you may have planned     

## 2017-04-01 DIAGNOSIS — R05 Cough: Secondary | ICD-10-CM | POA: Insufficient documentation

## 2017-04-01 DIAGNOSIS — R059 Cough, unspecified: Secondary | ICD-10-CM | POA: Insufficient documentation

## 2017-04-01 NOTE — Assessment & Plan Note (Signed)
Mild to mod, c/w broncihitis vs pna, unable for cxr at this facility due to not being able to stand, for antibx course, cough med prn, to f/u any worsening symptoms or concerns

## 2017-04-01 NOTE — Assessment & Plan Note (Signed)
stable overall by history and exam, recent data reviewed with pt, and pt to continue medical treatment as before,  to f/u any worsening symptoms or concerns Lab Results  Component Value Date   HGBA1C 6.4 11/04/2016

## 2017-04-01 NOTE — Assessment & Plan Note (Signed)
stable overall by history and exam, recent data reviewed with pt, and pt to continue medical treatment as before,  to f/u any worsening symptoms or concerns BP Readings from Last 3 Encounters:  03/31/17 116/68  11/04/16 126/72  10/07/16 116/72

## 2017-04-01 NOTE — Progress Notes (Signed)
Subjective:    Patient ID: Deanna Wall, female    DOB: 13-Feb-1932, 84 y.o.   MRN: [id number]  HPI Here with acute onset mild to mod 2-3 days ST, HA, general weakness and malaise, with prod cough greenish sputum, but Pt denies chest pain, increased sob or doe, wheezing, orthopnea, PND, increased LE swelling, palpitations, dizziness or syncope.   Pt denies polydipsia, polyuria, or low sugar symptoms such as weakness or confusion improved with po intake.  Pt states overall good compliance with meds, trying to follow lower cholesterol diet Past Medical History:  Diagnosis Date  . ANEMIA, PERNICIOUS 01/30/2009  . Cataracts, bilateral   . DEGENERATIVE DISC DISEASE 10/28/2006  . ECZEMA 10/28/2006  . Gait disorder   . Glaucoma   . HYPERLIPIDEMIA 02/16/2010  . HYPERTENSION 10/28/2006  . Impaired glucose tolerance 12/31/2010  . MS (multiple sclerosis) (HCC)   . Multiple sclerosis (HCC) 10/28/2006  . Obesity   . OSTEOARTHRITIS, KNEES, BILATERAL, SEVERE 06/14/2009  . Sebaceous cyst    chest   . VITAMIN B12 DEFICIENCY 06/14/2009   Past Surgical History:  Procedure Laterality Date  . COLONOSCOPY    . TUBAL LIGATION  1958  . UPPER GASTROINTESTINAL ENDOSCOPY      reports that  has never smoked. she has never used smokeless tobacco. She reports that she does not drink alcohol or use drugs. family history includes Arthritis in her mother and sister; Bone cancer in her mother; Cancer in her mother; Diabetes in her sister. Allergies  Allergen Reactions  . Atorvastatin     REACTION: feels funny in face and head   ] Current Outpatient Medications on File Prior to Visit  Medication Sig Dispense Refill  . amLODipine (NORVASC) 5 MG tablet TAKE 1 TABLET BY MOUTH DAILY 90 tablet 1  . aspirin 81 MG tablet Take 81 mg by mouth daily.      . cloNIDine (CATAPRES) 0.3 MG tablet TAKE 1 TABLET BY MOUTH TWICE DAILY 180 tablet 3  . ezetimibe (ZETIA) 10 MG tablet Take 1 tablet (10 mg total) by mouth daily. 90 tablet  3  . furosemide (LASIX) 20 MG tablet TAKE ONE TABLET BY MOUTH ONCE DAILY AS NEEDED FOR  SWELLING 90 tablet 3  . latanoprost (XALATAN) 0.005 % ophthalmic solution INSTILL 1 DROP IN BOTH EYES AT BEDTIME. KEEP THE BOTTLE IN THE FRIDGE UNTIL YOU OPEN IT.  3  . levocetirizine (XYZAL) 5 MG tablet TAKE ONE TABLET BY MOUTH ONCE DAILY IN THE EVENING 90 tablet 3  . meclizine (ANTIVERT) 12.5 MG tablet Take 1 tablet (12.5 mg total) by mouth 3 (three) times daily as needed for dizziness. 30 tablet 1  . nitrofurantoin (MACRODANTIN) 50 MG capsule TAKE ONE CAPSULE BY MOUTH ONCE DAILY 90 capsule 3  . potassium chloride (K-DUR,KLOR-CON) 10 MEQ tablet Take 1 tablet (10 mEq total) by mouth daily. 5 tablet 0  . timolol (TIMOPTIC) 0.5 % ophthalmic solution INSTILL ONE DROP IN [facility] EYE ONCE DAILY IN THE MORNING  4  . vitamin B-12 (CYANOCOBALAMIN) 500 MCG tablet Take 500 mcg by mouth daily.     No current facility-administered medications on file prior to visit.    Review of Systems  Constitutional: Negative for other unusual diaphoresis or sweats HENT: Negative for ear discharge or swelling Eyes: Negative for other worsening visual disturbances Respiratory: Negative for stridor or other swelling  Gastrointestinal: Negative for worsening distension or other blood Genitourinary: Negative for retention or other urinary change Musculoskeletal: Negative for other  MSK pain or swelling Skin: Negative for color change or other new lesions Neurological: Negative for worsening tremors and other numbness  Psychiatric/Behavioral: Negative for worsening agitation or other fatigue All other system neg per pt    Objective:   Physical Exam BP 116/68   Pulse 60   Temp 98.3 F (36.8 C) (Oral)   Ht 5\' 7"  (1.702 m)   SpO2 99%   BMI 40.41 kg/m  VS noted, mild ill Constitutional: Pt appears in NAD HENT: Head: NCAT.  Right Ear: External ear normal.  Left Ear: External ear normal.  Eyes: . Pupils are equal, round, and  reactive to light. Conjunctivae and EOM are normal Bilat tm's with mild erythema.  Max sinus areas non tender.  Pharynx with mild erythema, no exudate Nose: without d/c or deformity Neck: Neck supple. Gross normal ROM Cardiovascular: Normal rate and regular rhythm.   Pulmonary/Chest: Effort normal and breath sounds decreased without rales or wheezing.  Neurological: Pt is alert. At baseline orientation, motor grossly intact Skin: Skin is warm. No rashes, other new lesions, no LE edema Psychiatric: Pt behavior is normal without agitation  No other exam findings    Assessment & Plan:

## 2017-05-06 ENCOUNTER — Encounter: Payer: Self-pay | Admitting: Internal Medicine

## 2017-05-06 ENCOUNTER — Other Ambulatory Visit (INDEPENDENT_AMBULATORY_CARE_PROVIDER_SITE_OTHER): Payer: Medicare Other

## 2017-05-06 ENCOUNTER — Ambulatory Visit: Payer: Medicare Other | Admitting: Internal Medicine

## 2017-05-06 VITALS — BP 114/76 | HR 65 | Temp 97.8°F | Ht 67.0 in

## 2017-05-06 DIAGNOSIS — R7302 Impaired glucose tolerance (oral): Secondary | ICD-10-CM

## 2017-05-06 DIAGNOSIS — Z0000 Encounter for general adult medical examination without abnormal findings: Secondary | ICD-10-CM

## 2017-05-06 DIAGNOSIS — E611 Iron deficiency: Secondary | ICD-10-CM

## 2017-05-06 DIAGNOSIS — E538 Deficiency of other specified B group vitamins: Secondary | ICD-10-CM | POA: Diagnosis not present

## 2017-05-06 LAB — BASIC METABOLIC PANEL
BUN: 10 mg/dL (ref 6–23)
CHLORIDE: 105 meq/L (ref 96–112)
CO2: 29 meq/L (ref 19–32)
Calcium: 8.8 mg/dL (ref 8.4–10.5)
Creatinine, Ser: 0.82 mg/dL (ref 0.40–1.20)
GFR: 85.53 mL/min (ref >60.00)
GLUCOSE: 126 mg/dL — AB (ref 70–99)
POTASSIUM: 4 meq/L (ref 3.5–5.1)
SODIUM: 140 meq/L (ref 135–145)

## 2017-05-06 LAB — CBC WITH DIFFERENTIAL/PLATELET
BASOS PCT: 1 % (ref 0.0–3.0)
Basophils Absolute: 0.1 10*3/uL (ref 0.0–0.1)
EOS ABS: 0.1 10*3/uL (ref 0.0–0.7)
Eosinophils Relative: 2 % (ref 0.0–5.0)
HCT: 33.4 % — ABNORMAL LOW (ref 36.0–46.0)
Hemoglobin: 11.1 g/dL — ABNORMAL LOW (ref 12.0–15.0)
LYMPHS ABS: 1.7 10*3/uL (ref 0.7–4.0)
Lymphocytes Relative: 30.4 % (ref 12.0–46.0)
MCHC: 33.3 g/dL (ref 30.0–36.0)
MCV: 85.4 fl (ref 78.0–100.0)
MONO ABS: 0.3 10*3/uL (ref 0.1–1.0)
Monocytes Relative: 5.4 % (ref 3.0–12.0)
NEUTROS PCT: 61.2 % (ref 43.0–77.0)
Neutro Abs: 3.4 10*3/uL (ref 1.4–7.7)
PLATELETS: 330 10*3/uL (ref 150.0–400.0)
RBC: 3.91 Mil/uL (ref 3.87–5.11)
RDW: 14.7 % (ref 11.5–15.5)
WBC: 5.6 10*3/uL (ref 4.0–10.5)

## 2017-05-06 LAB — HEPATIC FUNCTION PANEL
ALT: 10 U/L (ref 0–35)
AST: 13 U/L (ref 0–37)
Albumin: 3.5 g/dL (ref 3.5–5.2)
Alkaline Phosphatase: 79 U/L (ref 39–117)
Bilirubin, Direct: 0.1 mg/dL (ref 0.0–0.3)
Total Bilirubin: 0.8 mg/dL (ref 0.2–1.2)
Total Protein: 6.9 g/dL (ref 6.0–8.3)

## 2017-05-06 LAB — URINALYSIS, ROUTINE W REFLEX MICROSCOPIC
BILIRUBIN URINE: NEGATIVE
KETONES UR: NEGATIVE
NITRITE: NEGATIVE
PH: 7.5 (ref 5.0–8.0)
SPECIFIC GRAVITY, URINE: 1.01 (ref 1.000–1.030)
Total Protein, Urine: NEGATIVE
URINE GLUCOSE: NEGATIVE
UROBILINOGEN UA: 0.2 (ref 0.0–1.0)

## 2017-05-06 LAB — LIPID PANEL
CHOL/HDL RATIO: 4
Cholesterol: 169 mg/dL (ref 0–200)
HDL: 44.3 mg/dL (ref >39.00)
LDL CALC: 103 mg/dL — AB (ref 0–99)
NonHDL: 124.26
Triglycerides: 108 mg/dL (ref 0.0–149.0)
VLDL: 21.6 mg/dL (ref 0.0–40.0)

## 2017-05-06 LAB — IBC PANEL
Iron: 59 ug/dL (ref 42–145)
Saturation Ratios: 18.7 % — ABNORMAL LOW (ref 20.0–50.0)
Transferrin: 225 mg/dL (ref 212.0–360.0)

## 2017-05-06 LAB — VITAMIN B12: Vitamin B-12: 780 pg/mL (ref 211–911)

## 2017-05-06 LAB — HEMOGLOBIN A1C: Hgb A1c MFr Bld: 6.2 % (ref 4.6–6.5)

## 2017-05-06 LAB — TSH: TSH: 3.73 u[IU]/mL (ref 0.35–4.50)

## 2017-05-06 NOTE — Progress Notes (Signed)
Subjective:    Patient ID: Deanna Wall, female    DOB: [birth year], 84 y.o.   MRN: [id number]  HPI  Here for wellness and f/u with husband;  Overall doing ok;  Pt denies Chest pain, worsening SOB, DOE, wheezing, orthopnea, PND, worsening LE edema, palpitations, dizziness or syncope.  Pt denies neurological change such as new headache, facial or extremity weakness.  Pt denies polydipsia, polyuria, or low sugar symptoms. Pt states overall good compliance with treatment and medications, good tolerability, and has been trying to follow appropriate diet.  Pt denies worsening depressive symptoms, suicidal ideation or panic. No fever, night sweats, wt loss, loss of appetite, or other constitutional symptoms.  Pt states good ability with ADL's, has high fall risk, home safety reviewed and adequate, no other significant changes in hearing or vision, and not active with exercise.  Declines DXA for now.  No new complaints or interval hx Past Medical History:  Diagnosis Date  . ANEMIA, PERNICIOUS 01/30/2009  . Cataracts, bilateral   . DEGENERATIVE DISC DISEASE 10/28/2006  . ECZEMA 10/28/2006  . Gait disorder   . Glaucoma   . HYPERLIPIDEMIA 02/16/2010  . HYPERTENSION 10/28/2006  . Impaired glucose tolerance 12/31/2010  . MS (multiple sclerosis) (HCC)   . Multiple sclerosis (HCC) 10/28/2006  . Obesity   . OSTEOARTHRITIS, KNEES, BILATERAL, SEVERE 06/14/2009  . Sebaceous cyst    chest   . VITAMIN B12 DEFICIENCY 06/14/2009   Past Surgical History:  Procedure Laterality Date  . COLONOSCOPY    . TUBAL LIGATION  1958  . UPPER GASTROINTESTINAL ENDOSCOPY      reports that  has never smoked. she has never used smokeless tobacco. She reports that she does not drink alcohol or use drugs. family history includes Arthritis in her mother and sister; Bone cancer in her mother; Cancer in her mother; Diabetes in her sister. Allergies  Allergen Reactions  . Atorvastatin     REACTION: feels funny in face and head    Current Outpatient Medications on File Prior to Visit  Medication Sig Dispense Refill  . amLODipine (NORVASC) 5 MG tablet TAKE 1 TABLET BY MOUTH DAILY 90 tablet 1  . aspirin 81 MG tablet Take 81 mg by mouth daily.      . cloNIDine (CATAPRES) 0.3 MG tablet TAKE 1 TABLET BY MOUTH TWICE DAILY 180 tablet 3  . ezetimibe (ZETIA) 10 MG tablet Take 1 tablet (10 mg total) by mouth daily. 90 tablet 3  . furosemide (LASIX) 20 MG tablet TAKE ONE TABLET BY MOUTH ONCE DAILY AS NEEDED FOR  SWELLING 90 tablet 3  . latanoprost (XALATAN) 0.005 % ophthalmic solution INSTILL 1 DROP IN BOTH EYES AT BEDTIME. KEEP THE BOTTLE IN THE FRIDGE UNTIL YOU OPEN IT.  3  . levocetirizine (XYZAL) 5 MG tablet TAKE ONE TABLET BY MOUTH ONCE DAILY IN THE EVENING 90 tablet 3  . meclizine (ANTIVERT) 12.5 MG tablet Take 1 tablet (12.5 mg total) by mouth 3 (three) times daily as needed for dizziness. 30 tablet 1  . nitrofurantoin (MACRODANTIN) 50 MG capsule TAKE ONE CAPSULE BY MOUTH ONCE DAILY 90 capsule 3  . potassium chloride (K-DUR,KLOR-CON) 10 MEQ tablet Take 1 tablet (10 mEq total) by mouth daily. 5 tablet 0  . timolol (TIMOPTIC) 0.5 % ophthalmic solution INSTILL ONE DROP IN [facility] EYE ONCE DAILY IN THE MORNING  4  . vitamin B-12 (CYANOCOBALAMIN) 500 MCG tablet Take 500 mcg by mouth daily.     No current facility-administered medications  on file prior to visit.    Review of Systems Constitutional: Negative for other unusual diaphoresis, sweats, appetite or weight changes HENT: Negative for other worsening hearing loss, ear pain, facial swelling, mouth sores or neck stiffness.   Eyes: Negative for other worsening pain, redness or other visual disturbance.  Respiratory: Negative for other stridor or swelling Cardiovascular: Negative for other palpitations or other chest pain  Gastrointestinal: Negative for worsening diarrhea or loose stools, blood in stool, distention or other pain Genitourinary: Negative for hematuria, flank  pain or other change in urine volume.  Musculoskeletal: Negative for myalgias or other joint swelling.  Skin: Negative for other color change, or other wound or worsening drainage.  Neurological: Negative for other syncope or numbness. Hematological: Negative for other adenopathy or swelling Psychiatric/Behavioral: Negative for hallucinations, other worsening agitation, SI, self-injury, or new decreased concentration All other system neg per pt    Objective:   Physical Exam BP 114/76   Pulse 65   Temp 97.8 F (36.6 C) (Oral)   Ht 5\' 7"  (1.702 m)   SpO2 97%   BMI 40.41 kg/m  VS noted, examined in wheelchair Constitutional: Pt is oriented to person, place, and time. Appears well-developed and well-nourished, in no significant distress and comfortable Head: Normocephalic and atraumatic  Eyes: Conjunctivae and EOM are normal. Pupils are equal, round, and reactive to light Right Ear: External ear normal without discharge Left Ear: External ear normal without discharge Nose: Nose without discharge or deformity Mouth/Throat: Oropharynx is without other ulcerations and moist  Neck: Normal range of motion. Neck supple. No JVD present. No tracheal deviation present or significant neck LA or mass Cardiovascular: Normal rate, regular rhythm, normal heart sounds and intact distal pulses.   Pulmonary/Chest: WOB normal and breath sounds without rales or wheezing  Abdominal: Soft. Bowel sounds are normal. NT. No HSM  Musculoskeletal: Normal range of motion. Exhibits no edema Lymphadenopathy: Has no other cervical adenopathy.  Neurological: Pt is alert and oriented to person, place, and time. Pt has normal reflexes. No cranial nerve deficit. Motor with 4/5 RLE weakness, o/w intact Skin: Skin is warm and dry. No rash noted or new ulcerations Psychiatric:  Has normal mood and affect. Behavior is normal without agitation No other exam findings    Assessment & Plan:

## 2017-05-06 NOTE — Patient Instructions (Signed)

## 2017-05-09 NOTE — Assessment & Plan Note (Signed)
Lab Results  Component Value Date   HGBA1C 6.2 05/06/2017  stable overall by history and exam, recent data reviewed with pt, and pt to continue medical treatment as before,  to f/u any worsening symptoms or concerns

## 2017-05-09 NOTE — Assessment & Plan Note (Signed)

## 2017-05-25 ENCOUNTER — Other Ambulatory Visit: Payer: Self-pay | Admitting: Internal Medicine

## 2017-06-07 ENCOUNTER — Other Ambulatory Visit: Payer: Self-pay | Admitting: Internal Medicine

## 2017-06-24 DIAGNOSIS — H401232 Low-tension glaucoma, bilateral, moderate stage: Secondary | ICD-10-CM | POA: Diagnosis not present

## 2017-06-24 DIAGNOSIS — H04123 Dry eye syndrome of bilateral lacrimal glands: Secondary | ICD-10-CM | POA: Diagnosis not present

## 2017-06-24 DIAGNOSIS — H35373 Puckering of macula, bilateral: Secondary | ICD-10-CM | POA: Diagnosis not present

## 2017-06-24 DIAGNOSIS — B079 Viral wart, unspecified: Secondary | ICD-10-CM | POA: Diagnosis not present

## 2017-06-28 ENCOUNTER — Other Ambulatory Visit: Payer: Self-pay | Admitting: Internal Medicine

## 2017-07-01 ENCOUNTER — Telehealth: Payer: Self-pay | Admitting: Internal Medicine

## 2017-07-01 NOTE — Telephone Encounter (Signed)
Patient dropped off Parking placard renewal.  Placed in [name]'s box for tracking.  Patient would like call once ready for pickup.

## 2017-07-01 NOTE — Telephone Encounter (Signed)
Form has been completed & placed in providers box to review to sign.

## 2017-07-02 NOTE — Telephone Encounter (Signed)
Patient has been inform that the form is ready to be picked up.   Copy sent to scan.

## 2017-07-26 DIAGNOSIS — Z1231 Encounter for screening mammogram for malignant neoplasm of breast: Secondary | ICD-10-CM | POA: Diagnosis not present

## 2017-07-26 LAB — HM MAMMOGRAPHY

## 2017-08-03 LAB — HM MAMMOGRAPHY

## 2017-10-05 DIAGNOSIS — S0500XA Injury of conjunctiva and corneal abrasion without foreign body, unspecified eye, initial encounter: Secondary | ICD-10-CM | POA: Diagnosis not present

## 2017-10-05 DIAGNOSIS — H401232 Low-tension glaucoma, bilateral, moderate stage: Secondary | ICD-10-CM | POA: Diagnosis not present

## 2017-10-05 DIAGNOSIS — H35033 Hypertensive retinopathy, bilateral: Secondary | ICD-10-CM | POA: Diagnosis not present

## 2017-10-05 DIAGNOSIS — H35373 Puckering of macula, bilateral: Secondary | ICD-10-CM | POA: Diagnosis not present

## 2017-10-13 ENCOUNTER — Encounter: Payer: Self-pay | Admitting: Podiatry

## 2017-10-13 ENCOUNTER — Ambulatory Visit: Payer: Medicare Other | Admitting: Podiatry

## 2017-10-13 DIAGNOSIS — B351 Tinea unguium: Secondary | ICD-10-CM | POA: Diagnosis not present

## 2017-10-13 DIAGNOSIS — M79675 Pain in left toe(s): Secondary | ICD-10-CM

## 2017-10-13 DIAGNOSIS — M79674 Pain in right toe(s): Secondary | ICD-10-CM | POA: Diagnosis not present

## 2017-10-13 DIAGNOSIS — L600 Ingrowing nail: Secondary | ICD-10-CM | POA: Diagnosis not present

## 2017-10-13 NOTE — Progress Notes (Signed)
Subjective:    Patient ID: Deanna Wall, female    DOB: 12-27-1931, 84 y.o.   MRN: [id number]  HPI 84 year old female presents the office today with her mom for concerns of thick, discolored toenails that she cannot trim herself and she states the big toenail gangrene which cause pain.  Denies any redness or drainage or any swelling.  She has no other concerns.  Review of Systems  All other systems reviewed and are negative.  Past Medical History:  Diagnosis Date  . ANEMIA, PERNICIOUS 01/30/2009  . Cataracts, bilateral   . DEGENERATIVE DISC DISEASE 10/28/2006  . ECZEMA 10/28/2006  . Gait disorder   . Glaucoma   . HYPERLIPIDEMIA 02/16/2010  . HYPERTENSION 10/28/2006  . Impaired glucose tolerance 12/31/2010  . MS (multiple sclerosis) (HCC)   . Multiple sclerosis (HCC) 10/28/2006  . Obesity   . OSTEOARTHRITIS, KNEES, BILATERAL, SEVERE 06/14/2009  . Sebaceous cyst    chest   . VITAMIN B12 DEFICIENCY 06/14/2009    Past Surgical History:  Procedure Laterality Date  . COLONOSCOPY    . TUBAL LIGATION  1958  . UPPER GASTROINTESTINAL ENDOSCOPY       Current Outpatient Medications:  .  amLODipine (NORVASC) 5 MG tablet, TAKE 1 TABLET BY MOUTH ONCE DAILY, Disp: 90 tablet, Rfl: 1 .  aspirin 81 MG tablet, Take 81 mg by mouth daily.  , Disp: , Rfl:  .  cloNIDine (CATAPRES) 0.3 MG tablet, TAKE 1 TABLET BY MOUTH TWICE DAILY, Disp: 180 tablet, Rfl: 3 .  ezetimibe (ZETIA) 10 MG tablet, Take 1 tablet (10 mg total) by mouth daily., Disp: 90 tablet, Rfl: 3 .  furosemide (LASIX) 20 MG tablet, TAKE ONE TABLET BY MOUTH ONCE DAILY AS NEEDED FOR  SWELLING, Disp: 90 tablet, Rfl: 3 .  latanoprost (XALATAN) 0.005 % ophthalmic solution, INSTILL 1 DROP IN BOTH EYES AT BEDTIME. KEEP THE BOTTLE IN THE FRIDGE UNTIL YOU OPEN IT., Disp: , Rfl: 3 .  levocetirizine (XYZAL) 5 MG tablet, TAKE 1 TABLET BY MOUTH ONCE DAILY IN THE EVENING, Disp: 90 tablet, Rfl: 1 .  nitrofurantoin (MACRODANTIN) 50 MG capsule, TAKE 1  CAPSULE BY MOUTH ONCE DAILY, Disp: 30 capsule, Rfl: 11 .  potassium chloride (K-DUR,KLOR-CON) 10 MEQ tablet, Take 1 tablet (10 mEq total) by mouth daily., Disp: 5 tablet, Rfl: 0 .  timolol (TIMOPTIC) 0.5 % ophthalmic solution, INSTILL ONE DROP IN [facility] EYE ONCE DAILY IN THE MORNING, Disp: , Rfl: 4 .  vitamin B-12 (CYANOCOBALAMIN) 500 MCG tablet, Take 500 mcg by mouth daily., Disp: , Rfl:   Allergies  Allergen Reactions  . Atorvastatin     REACTION: feels funny in face and head        Objective:   Physical Exam General: AAO x3, NAD  Dermatological: Nails are hypertrophic, dystrophic, brittle, discolored, elongated 10.  There is incurvation present on the medial lateral aspects of bilateral hallux toenails without any signs of infection.  No surrounding redness or drainage. Tenderness nails 1-5 bilaterally. No open lesions or pre-ulcerative lesions are identified today.  Vascular: Dorsalis Pedis artery and Posterior Tibial artery pedal pulses are 2/4 bilateral with immedate capillary fill time.  There is no pain with calf compression, swelling, warmth, erythema.   Neruologic: Grossly intact via light touch bilateral.Protective threshold with Semmes Wienstein monofilament intact to all pedal sites bilateral.   Musculoskeletal: No gross boney pedal deformities bilateral. No pain, crepitus, or limitation noted with foot and ankle range of motion bilateral. Muscular strength  5/5 in all groups tested bilateral.  Gait: Unassisted, Nonantalgic.      Assessment & Plan:  84 year old female symptom medic ingrown toenails bilateral hallux toenails with onychomycosis -Treatment options discussed including all alternatives, risks, and complications -Etiology of symptoms were discussed -Discussed treatment options for ingrown toenails.  She was somewhat of a partial nail avulsions.  I sharply debrided the nails without any complications or bleeding.  She symptoms continue to consider partial nail  avulsion. -Daily foot inspection -Recommend periodic debridement.  We will see her back in 3 months for routine care.  Vivi Barrack DPM

## 2017-10-19 ENCOUNTER — Telehealth: Payer: Self-pay | Admitting: Internal Medicine

## 2017-10-19 NOTE — Telephone Encounter (Signed)
Copied from CRM [id number]. Topic: Quick Communication - See Telephone Encounter >> Oct 19, 2017  1:45 PM [name] wrote: CRM for notification. See Telephone encounter for: 10/19/17.  Deanna Wall, nurse practitioner called and said her heart rate has been running really low in the 40's. She said she saw her last night. The highest it was at around 50. She said that she is using timolol (TIMOPTIC) 0.5 % ophthalmic solution for her eyes. Call back @ [phone]

## 2017-10-20 MED ORDER — CLONIDINE HCL 0.1 MG PO TABS: 0.1000 mg | ORAL_TABLET | Freq: Three times a day (TID) | ORAL | 5 refills | Status: DC

## 2017-10-20 NOTE — Telephone Encounter (Signed)
Pt has been informed after speaking with Deanna Wall. Pt has an appt 10/21/17 at 11am.

## 2017-10-20 NOTE — Telephone Encounter (Signed)
Called Melinda, LVM to return call to discuss.

## 2017-10-20 NOTE — Telephone Encounter (Signed)
The HR being low could possibly be related to the eye drops but this is rare  It is Much more common to be due to the clonidine, but we have to be very careful NOT to just stop the clonidine completely as this can lead to unusual severe rebound HTN  OK to CHANGE her clonidine for now to 0.1 mg bid (I will send rx) and continue the eye drops for now  Pt needs ROV this wk at some point such as tomorrow or Friday (or Monday at the latest) to see how BP and HR are doing

## 2017-10-21 ENCOUNTER — Encounter: Payer: Self-pay | Admitting: Internal Medicine

## 2017-10-21 ENCOUNTER — Other Ambulatory Visit: Payer: Self-pay | Admitting: Internal Medicine

## 2017-10-21 ENCOUNTER — Other Ambulatory Visit (INDEPENDENT_AMBULATORY_CARE_PROVIDER_SITE_OTHER): Payer: Medicare Other

## 2017-10-21 ENCOUNTER — Ambulatory Visit: Payer: Medicare Other | Admitting: Internal Medicine

## 2017-10-21 VITALS — BP 124/80 | HR 43 | Temp 98.0°F | Ht 67.0 in

## 2017-10-21 DIAGNOSIS — R001 Bradycardia, unspecified: Secondary | ICD-10-CM | POA: Diagnosis not present

## 2017-10-21 DIAGNOSIS — R7302 Impaired glucose tolerance (oral): Secondary | ICD-10-CM

## 2017-10-21 DIAGNOSIS — I10 Essential (primary) hypertension: Secondary | ICD-10-CM

## 2017-10-21 DIAGNOSIS — E785 Hyperlipidemia, unspecified: Secondary | ICD-10-CM

## 2017-10-21 LAB — CBC WITH DIFFERENTIAL/PLATELET
Basophils Absolute: 0.1 10*3/uL (ref 0.0–0.1)
Basophils Relative: 0.9 % (ref 0.0–3.0)
Eosinophils Absolute: 0.1 10*3/uL (ref 0.0–0.7)
Eosinophils Relative: 1.3 % (ref 0.0–5.0)
HCT: 36.1 % (ref 36.0–46.0)
Hemoglobin: 12.2 g/dL (ref 12.0–15.0)
LYMPHS PCT: 27.9 % (ref 12.0–46.0)
Lymphs Abs: 1.9 10*3/uL (ref 0.7–4.0)
MCHC: 33.6 g/dL (ref 30.0–36.0)
MCV: 85.4 fl (ref 78.0–100.0)
Monocytes Absolute: 0.5 10*3/uL (ref 0.1–1.0)
Monocytes Relative: 7 % (ref 3.0–12.0)
Neutro Abs: 4.2 10*3/uL (ref 1.4–7.7)
Neutrophils Relative %: 62.9 % (ref 43.0–77.0)
PLATELETS: 332 10*3/uL (ref 150.0–400.0)
RBC: 4.23 Mil/uL (ref 3.87–5.11)
RDW: 14.6 % (ref 11.5–15.5)
WBC: 6.6 10*3/uL (ref 4.0–10.5)

## 2017-10-21 LAB — TSH: TSH: 4.36 u[IU]/mL (ref 0.35–4.50)

## 2017-10-21 LAB — HEPATIC FUNCTION PANEL
ALT: 11 U/L (ref 0–35)
AST: 14 U/L (ref 0–37)
Albumin: 4 g/dL (ref 3.5–5.2)
Alkaline Phosphatase: 85 U/L (ref 39–117)
Bilirubin, Direct: 0.2 mg/dL (ref 0.0–0.3)
Total Bilirubin: 0.9 mg/dL (ref 0.2–1.2)
Total Protein: 7.6 g/dL (ref 6.0–8.3)

## 2017-10-21 LAB — BASIC METABOLIC PANEL
BUN: 11 mg/dL (ref 6–23)
CO2: 29 mEq/L (ref 19–32)
Calcium: 9.2 mg/dL (ref 8.4–10.5)
Chloride: 103 mEq/L (ref 96–112)
Creatinine, Ser: 0.79 mg/dL (ref 0.40–1.20)
GFR: 89.19 mL/min (ref >60.00)
Glucose, Bld: 110 mg/dL — ABNORMAL HIGH (ref 70–99)
POTASSIUM: 3.3 meq/L — AB (ref 3.5–5.1)
SODIUM: 139 meq/L (ref 135–145)

## 2017-10-21 LAB — LIPID PANEL
Cholesterol: 186 mg/dL (ref 0–200)
HDL: 57.4 mg/dL (ref >39.00)
LDL CALC: 107 mg/dL — AB (ref 0–99)
NonHDL: 128.18
Total CHOL/HDL Ratio: 3
Triglycerides: 108 mg/dL (ref 0.0–149.0)
VLDL: 21.6 mg/dL (ref 0.0–40.0)

## 2017-10-21 LAB — HEMOGLOBIN A1C: Hgb A1c MFr Bld: 6.3 % (ref 4.6–6.5)

## 2017-10-21 MED ORDER — POTASSIUM CHLORIDE ER 10 MEQ PO TBCR: 10.0000 meq | EXTENDED_RELEASE_TABLET | Freq: Every day | ORAL | 3 refills | Status: DC

## 2017-10-21 NOTE — Patient Instructions (Signed)
Ok to take the decreased BP pill (clonidine 0.1 mg twice per day)  OK to STOP the clonidine you are taking now (0.3 mg twice per day)  Please contact your eye doctor to mention the low Heart Rate and see if your eye drops can be changed  Makes sure to check your Blood pressure daily which also shows the Heart Rate  Please call if your Blood Pressure goes up more than 140/90 most of the time  Please call 1 week if your Heart Rate stays less than 50  Please continue all other medications as before, and refills have been done if requested.  Please have the pharmacy call with any other refills you may need.  Please continue your efforts at being more active, low cholesterol diet, and weight control.  Please keep your appointments with your specialists as you may have planned  Please go to the LAB in the Basement (turn left off the elevator) for the tests to be done today  You will be contacted by phone if any changes need to be made immediately.  Otherwise, you will receive a letter about your results with an explanation, but please check with MyChart first.  Please remember to sign up for MyChart if you have not done so, as this will be important to you in the future with finding out test results, communicating by private email, and scheduling acute appointments online when needed.

## 2017-10-21 NOTE — Progress Notes (Signed)
Subjective:    Patient ID: Deanna Wall, female    DOB: [birth year], 84 y.o.   MRN: [id number]  HPI  Here to f/u low HR found after insurance nurse visited her home; we were notified yesterday, but pt has not had chance to change meds as recommended; pt Recently was changed per optho to BB eye drop in July with f/u for glaucoma in October - optho - Dr Hanley Seamen.   Not yet changed the clonidin 0.3 to 0.1 (just prescribed yesterday)  Does not have home BP monitor but plans to get one. Pt denies chest pain, increased sob or doe, wheezing, orthopnea, PND, increased LE swelling, palpitations, dizziness or syncope.  Pt denies new neurological symptoms such as new headache, or facial weakness or other focal symptoms BP Readings from Last 3 Encounters:  10/21/17 124/80  05/06/17 114/76  03/31/17 116/68   Past Medical History:  Diagnosis Date  . ANEMIA, PERNICIOUS 01/30/2009  . Cataracts, bilateral   . DEGENERATIVE DISC DISEASE 10/28/2006  . ECZEMA 10/28/2006  . Gait disorder   . Glaucoma   . HYPERLIPIDEMIA 02/16/2010  . HYPERTENSION 10/28/2006  . Impaired glucose tolerance 12/31/2010  . MS (multiple sclerosis) (HCC)   . Multiple sclerosis (HCC) 10/28/2006  . Obesity   . OSTEOARTHRITIS, KNEES, BILATERAL, SEVERE 06/14/2009  . Sebaceous cyst    chest   . VITAMIN B12 DEFICIENCY 06/14/2009   Past Surgical History:  Procedure Laterality Date  . COLONOSCOPY    . TUBAL LIGATION  1958  . UPPER GASTROINTESTINAL ENDOSCOPY      reports that she has never smoked. She has never used smokeless tobacco. She reports that she does not drink alcohol or use drugs. family history includes Arthritis in her mother and sister; Bone cancer in her mother; Cancer in her mother; Diabetes in her sister. Allergies  Allergen Reactions  . Atorvastatin     REACTION: feels funny in face and head   Current Outpatient Medications on File Prior to Visit  Medication Sig Dispense Refill  . amLODipine (NORVASC) 5 MG tablet  TAKE 1 TABLET BY MOUTH ONCE DAILY 90 tablet 1  . aspirin 81 MG tablet Take 81 mg by mouth daily.      . cloNIDine (CATAPRES) 0.1 MG tablet Take 1 tablet (0.1 mg total) by mouth 3 (three) times daily. 60 tablet 5  . ezetimibe (ZETIA) 10 MG tablet Take 1 tablet (10 mg total) by mouth daily. 90 tablet 3  . furosemide (LASIX) 20 MG tablet TAKE ONE TABLET BY MOUTH ONCE DAILY AS NEEDED FOR  SWELLING 90 tablet 3  . latanoprost (XALATAN) 0.005 % ophthalmic solution INSTILL 1 DROP IN BOTH EYES AT BEDTIME. KEEP THE BOTTLE IN THE FRIDGE UNTIL YOU OPEN IT.  3  . levocetirizine (XYZAL) 5 MG tablet TAKE 1 TABLET BY MOUTH ONCE DAILY IN THE EVENING 90 tablet 1  . nitrofurantoin (MACRODANTIN) 50 MG capsule TAKE 1 CAPSULE BY MOUTH ONCE DAILY 30 capsule 11  . timolol (TIMOPTIC) 0.5 % ophthalmic solution INSTILL ONE DROP IN EACH EYE ONCE DAILY IN THE MORNING  4  . vitamin B-12 (CYANOCOBALAMIN) 500 MCG tablet Take 500 mcg by mouth daily.     No current facility-administered medications on file prior to visit.    Review of Systems  Constitutional: Negative for other unusual diaphoresis or sweats HENT: Negative for ear discharge or swelling Eyes: Negative for other worsening visual disturbances Respiratory: Negative for stridor or other swelling  Gastrointestinal: Negative for worsening distension  or other blood Genitourinary: Negative for retention or other urinary change Musculoskeletal: Negative for other MSK pain or swelling Skin: Negative for color change or other new lesions Neurological: Negative for worsening tremors and other numbness  Psychiatric/Behavioral: Negative for worsening agitation or other fatigue All other system neg per pt    Objective:   Physical Exam BP 124/80   Pulse (!) 43   Temp 98 F (36.7 C) (Oral)   Ht 5\' 7"  (1.702 m)   SpO2 94%   BMI 40.41 kg/m  VS noted, not ill appaering Constitutional: Pt appears in NAD HENT: Head: NCAT.  Right Ear: External ear normal.  Left Ear:  External ear normal.  Eyes: . Pupils are equal, round, and reactive to light. Conjunctivae and EOM are normal Nose: without d/c or deformity Neck: Neck supple. Gross normal ROM Cardiovascular: Slow rate and regular rhythm.   Pulmonary/Chest: Effort normal and breath sounds without rales or wheezing.  Neurological: Pt is alert. At baseline orientation, motor intact to UE, cn 2-12 intact Skin: Skin is warm. No rashes, other new lesions, no LE edema Psychiatric: Pt behavior is normal without agitation  No other exam findings  ECG today I have personally interpreted:   Sinus bradycardia 44, no ischemic changes    Assessment & Plan:

## 2017-10-21 NOTE — Assessment & Plan Note (Signed)
stable overall by history and exam, recent data reviewed with pt, and pt to continue medical treatment as before,  to f/u any worsening symptoms or concerns Lab Results  Component Value Date   LDLCALC 107 (H) 10/21/2017

## 2017-10-21 NOTE — Assessment & Plan Note (Signed)
stable overall by history and exam, recent data reviewed with pt, and pt to continue medical treatment as before,  to f/u any worsening symptoms or concerns BP Readings from Last 3 Encounters:  10/21/17 124/80  05/06/17 114/76  03/31/17 116/68

## 2017-10-21 NOTE — Assessment & Plan Note (Signed)
Sinus rhythm, to reduce clonidine to 0.1 bid, pt to call optho to consider BB eye drop change, for labs asd today, continue to monitor BP and HR at home, f/u OV 1-2 wks

## 2017-10-21 NOTE — Assessment & Plan Note (Signed)
stable overall by history and exam, recent data reviewed with pt, and pt to continue medical treatment as before,  to f/u any worsening symptoms or concerns Lab Results  Component Value Date   HGBA1C 6.3 10/21/2017   

## 2017-11-04 ENCOUNTER — Encounter: Payer: Self-pay | Admitting: Internal Medicine

## 2017-11-04 ENCOUNTER — Ambulatory Visit: Payer: Medicare Other | Admitting: Internal Medicine

## 2017-11-04 VITALS — BP 136/84 | HR 82 | Temp 98.0°F | Ht 67.0 in

## 2017-11-04 DIAGNOSIS — R7302 Impaired glucose tolerance (oral): Secondary | ICD-10-CM

## 2017-11-04 DIAGNOSIS — I10 Essential (primary) hypertension: Secondary | ICD-10-CM | POA: Diagnosis not present

## 2017-11-04 DIAGNOSIS — F419 Anxiety disorder, unspecified: Secondary | ICD-10-CM

## 2017-11-04 DIAGNOSIS — J309 Allergic rhinitis, unspecified: Secondary | ICD-10-CM | POA: Diagnosis not present

## 2017-11-04 DIAGNOSIS — E785 Hyperlipidemia, unspecified: Secondary | ICD-10-CM | POA: Diagnosis not present

## 2017-11-04 MED ORDER — CLONIDINE HCL 0.3 MG PO TABS: 0.3000 mg | ORAL_TABLET | Freq: Two times a day (BID) | ORAL | 3 refills | Status: DC

## 2017-11-04 MED ORDER — CITALOPRAM HYDROBROMIDE 10 MG PO TABS: 10.0000 mg | ORAL_TABLET | Freq: Every day | ORAL | 3 refills | Status: DC

## 2017-11-04 NOTE — Assessment & Plan Note (Addendum)
stable overall by history and exam, recent data reviewed with pt, and pt to continue medical treatment as before,  to f/u any worsening symptoms or concerns BP Readings from Last 3 Encounters:  11/04/17 136/84  10/21/17 124/80  05/06/17 114/76   Note:  Total time for pt hx, exam, review of record with pt in the room, determination of diagnoses and plan for further eval and tx is > 40 min, with over 50% spent in coordination and counseling of patient including the differential dx, tx, further evaluation and other management of HTN, HLD, hyperglycemia, allergies and anxiety

## 2017-11-04 NOTE — Assessment & Plan Note (Signed)
Ok for celexa 10 qd 

## 2017-11-04 NOTE — Assessment & Plan Note (Signed)
Mild to mod, for claritin and nasacort asd OTC, to f/u any worsening symptoms or concerns

## 2017-11-04 NOTE — Assessment & Plan Note (Signed)
Lab Results  Component Value Date   LDLCALC 107 (H) 10/21/2017  stable overall by history and exam, recent data reviewed with pt, and pt to continue medical treatment as before,  to f/u any worsening symptoms or concerns

## 2017-11-04 NOTE — Patient Instructions (Addendum)
Ok to continue the clonidine 0.3 mg twice per day as you are doing  Please take all new medication as prescribed - the celexa 10 mg per day  Please continue all other medications as before, and refills have been done if requested.  Please have the pharmacy call with any other refills you may need.  Please continue your efforts at being more active, low cholesterol diet, and weight control.  Please keep your appointments with your specialists as you may have planned  Please return in 6 months, or sooner if needed, with Lab testing done 3-5 days before

## 2017-11-04 NOTE — Assessment & Plan Note (Signed)
stable overall by history and exam, recent data reviewed with pt, and pt to continue medical treatment as before,  to f/u any worsening symptoms or concerns Lab Results  Component Value Date   HGBA1C 6.3 10/21/2017

## 2017-11-04 NOTE — Progress Notes (Signed)
Subjective:    Patient ID: Deanna Wall, female    DOB: 1931-07-20, 84 y.o.   MRN: [id number]  HPI  Here to f/u; overall doing ok,  Pt denies chest pain, increasing sob or doe, wheezing, orthopnea, PND, increased LE swelling, palpitations, dizziness or syncope.  Pt denies new neurological symptoms such as new headache, or facial or extremity weakness or numbness.  Pt denies polydipsia, polyuria, or low sugar episode.  Pt states overall good compliance with meds, mostly trying to follow appropriate diet, with wt overall stable,  but little exercise however. Does have several wks ongoing nasal allergy symptoms with clearish congestion, itch and sneezing, without fever, pain, ST, cough, swelling or wheezing, but head feels heavy. Denies worsening depressive symptoms, suicidal ideation, or panic; has ongoing anxiety, some worse recently per pt and family asking her to accept some medication.   Past Medical History:  Diagnosis Date  . ANEMIA, PERNICIOUS 01/30/2009  . Cataracts, bilateral   . DEGENERATIVE DISC DISEASE 10/28/2006  . ECZEMA 10/28/2006  . Gait disorder   . Glaucoma   . HYPERLIPIDEMIA 02/16/2010  . HYPERTENSION 10/28/2006  . Impaired glucose tolerance 12/31/2010  . MS (multiple sclerosis) (HCC)   . Multiple sclerosis (HCC) 10/28/2006  . Obesity   . OSTEOARTHRITIS, KNEES, BILATERAL, SEVERE 06/14/2009  . Sebaceous cyst    chest   . VITAMIN B12 DEFICIENCY 06/14/2009   Past Surgical History:  Procedure Laterality Date  . COLONOSCOPY    . TUBAL LIGATION  1958  . UPPER GASTROINTESTINAL ENDOSCOPY      reports that she has never smoked. She has never used smokeless tobacco. She reports that she does not drink alcohol or use drugs. family history includes Arthritis in her mother and sister; Bone cancer in her mother; Cancer in her mother; Diabetes in her sister. Allergies  Allergen Reactions  . Atorvastatin     REACTION: feels funny in face and head   Current Outpatient Medications on  File Prior to Visit  Medication Sig Dispense Refill  . amLODipine (NORVASC) 5 MG tablet TAKE 1 TABLET BY MOUTH ONCE DAILY 90 tablet 1  . aspirin 81 MG tablet Take 81 mg by mouth daily.      Marland Kitchen ezetimibe (ZETIA) 10 MG tablet Take 1 tablet (10 mg total) by mouth daily. 90 tablet 3  . furosemide (LASIX) 20 MG tablet TAKE ONE TABLET BY MOUTH ONCE DAILY AS NEEDED FOR  SWELLING 90 tablet 3  . latanoprost (XALATAN) 0.005 % ophthalmic solution INSTILL 1 DROP IN BOTH EYES AT BEDTIME. KEEP THE BOTTLE IN THE FRIDGE UNTIL YOU OPEN IT.  3  . levocetirizine (XYZAL) 5 MG tablet TAKE 1 TABLET BY MOUTH ONCE DAILY IN THE EVENING 90 tablet 1  . nitrofurantoin (MACRODANTIN) 50 MG capsule TAKE 1 CAPSULE BY MOUTH ONCE DAILY 30 capsule 11  . potassium chloride (K-DUR) 10 MEQ tablet Take 1 tablet (10 mEq total) by mouth daily. 90 tablet 3  . timolol (TIMOPTIC) 0.5 % ophthalmic solution INSTILL ONE DROP IN EACH EYE ONCE DAILY IN THE MORNING  4  . vitamin B-12 (CYANOCOBALAMIN) 500 MCG tablet Take 500 mcg by mouth daily.     No current facility-administered medications on file prior to visit.    Review of Systems  Constitutional: Negative for other unusual diaphoresis or sweats HENT: Negative for ear discharge or swelling Eyes: Negative for other worsening visual disturbances Respiratory: Negative for stridor or other swelling  Gastrointestinal: Negative for worsening distension or other blood  Genitourinary: Negative for retention or other urinary change Musculoskeletal: Negative for other MSK pain or swelling Skin: Negative for color change or other new lesions Neurological: Negative for worsening tremors and other numbness  Psychiatric/Behavioral: Negative for worsening agitation or other fatigue All other system neg per pt    Objective:   Physical Exam BP 136/84   Pulse 82   Temp 98 F (36.7 C) (Oral)   Ht 5\' 7"  (1.702 m)   SpO2 96%   BMI 40.41 kg/m  VS noted,  Constitutional: Pt appears in NAD HENT:  Head: NCAT.  Right Ear: External ear normal.  Left Ear: External ear normal.  Bilat tm's with mild erythema.  Max sinus areas non tender.  Pharynx with mild erythema, no exudate Eyes: . Pupils are equal, round, and reactive to light. Conjunctivae and EOM are normal Nose: without d/c or deformity Neck: Neck supple. Gross normal ROM Cardiovascular: Normal rate and regular rhythm.   Pulmonary/Chest: Effort normal and breath sounds without rales or wheezing.  Abd:  Soft, NT, ND, + BS, no organomegaly Neurological: Pt is alert. At baseline orientation, motor no change, wheelchair bound Skin: Skin is warm. No rashes, other new lesions, trace bilat LE edema Psychiatric: Pt behavior is normal without agitation , mild nervous No other exam findings  Lab Results  Component Value Date   WBC 6.6 10/21/2017   HGB 12.2 10/21/2017   HCT 36.1 10/21/2017   PLT 332.0 10/21/2017   GLUCOSE 110 (H) 10/21/2017   CHOL 186 10/21/2017   TRIG 108.0 10/21/2017   HDL 57.40 10/21/2017   LDLDIRECT 180.0 01/29/2010   LDLCALC 107 (H) 10/21/2017   ALT 11 10/21/2017   AST 14 10/21/2017   NA 139 10/21/2017   K 3.3 (L) 10/21/2017   CL 103 10/21/2017   CREATININE 0.79 10/21/2017   BUN 11 10/21/2017   CO2 29 10/21/2017   TSH 4.36 10/21/2017   HGBA1C 6.3 10/21/2017      Assessment & Plan:

## 2017-11-30 ENCOUNTER — Other Ambulatory Visit: Payer: Self-pay | Admitting: Internal Medicine

## 2017-12-16 ENCOUNTER — Other Ambulatory Visit: Payer: Self-pay | Admitting: Internal Medicine

## 2018-01-12 DIAGNOSIS — H35373 Puckering of macula, bilateral: Secondary | ICD-10-CM | POA: Diagnosis not present

## 2018-01-12 DIAGNOSIS — H35033 Hypertensive retinopathy, bilateral: Secondary | ICD-10-CM | POA: Diagnosis not present

## 2018-01-12 DIAGNOSIS — H401232 Low-tension glaucoma, bilateral, moderate stage: Secondary | ICD-10-CM | POA: Diagnosis not present

## 2018-01-12 DIAGNOSIS — H11823 Conjunctivochalasis, bilateral: Secondary | ICD-10-CM | POA: Diagnosis not present

## 2018-01-13 ENCOUNTER — Other Ambulatory Visit: Payer: Self-pay | Admitting: Internal Medicine

## 2018-01-14 ENCOUNTER — Ambulatory Visit (INDEPENDENT_AMBULATORY_CARE_PROVIDER_SITE_OTHER): Payer: Medicare Other | Admitting: *Deleted

## 2018-01-14 DIAGNOSIS — Z23 Encounter for immunization: Secondary | ICD-10-CM | POA: Diagnosis not present

## 2018-01-24 ENCOUNTER — Ambulatory Visit (INDEPENDENT_AMBULATORY_CARE_PROVIDER_SITE_OTHER): Payer: Medicare Other | Admitting: Podiatry

## 2018-01-24 DIAGNOSIS — Q828 Other specified congenital malformations of skin: Secondary | ICD-10-CM | POA: Diagnosis not present

## 2018-01-24 DIAGNOSIS — M79675 Pain in left toe(s): Secondary | ICD-10-CM | POA: Diagnosis not present

## 2018-01-24 DIAGNOSIS — M79674 Pain in right toe(s): Secondary | ICD-10-CM

## 2018-01-24 DIAGNOSIS — B351 Tinea unguium: Secondary | ICD-10-CM | POA: Diagnosis not present

## 2018-01-26 NOTE — Progress Notes (Signed)
Subjective: 84 y.o. returns the office today for painful, elongated, thickened toenails which she cannot trim herself. Denies any redness or drainage around the nails.  She also has some calluses that she was to have trimmed.  Denies any acute changes since last appointment and no new complaints today. Denies any systemic complaints such as fevers, chills, nausea, vomiting.   PCP: Corwin Levins, MD  Objective: AAO 3, NAD DP/PT pulses palpable, CRT less than 3 seconds Nails hypertrophic, dystrophic, elongated, brittle, discolored 10. There is tenderness overlying the nails 1-5 bilaterally. There is no surrounding erythema or drainage along the nail sites. Hyperkeratotic lesions right medial hallux.  Minimal callus left posterior heel.  No ongoing ulceration drainage or any signs of infection. No open lesions or pre-ulcerative lesions are identified. No other areas of tenderness bilateral lower extremities. No overlying edema, erythema, increased warmth. No pain with calf compression, swelling, warmth, erythema.  Assessment: Patient presents with symptomatic onychomycosis, hyperkeratotic lesions  Plan: -Treatment options including alternatives, risks, complications were discussed -Nails sharply debrided 10 without complication/bleeding. -Hyperkeratotic lesion sharply debrided without any complications or bleeding.  Discussed moisturizer to the area daily.  I did not bring the left posterior heel ulcer is very minimal today and more dry skin. -Discussed daily foot inspection. If there are any changes, to call the office immediately.  -Follow-up in 3 months or sooner if any problems are to arise. In the meantime, encouraged to call the office with any questions, concerns, changes symptoms.  Ovid Curd, DPM

## 2018-04-21 DIAGNOSIS — H401232 Low-tension glaucoma, bilateral, moderate stage: Secondary | ICD-10-CM | POA: Diagnosis not present

## 2018-04-26 ENCOUNTER — Encounter: Payer: Self-pay | Admitting: Podiatry

## 2018-04-26 ENCOUNTER — Ambulatory Visit (INDEPENDENT_AMBULATORY_CARE_PROVIDER_SITE_OTHER): Payer: Medicare Other | Admitting: Podiatry

## 2018-04-26 DIAGNOSIS — G35 Multiple sclerosis: Secondary | ICD-10-CM

## 2018-04-26 DIAGNOSIS — B351 Tinea unguium: Secondary | ICD-10-CM

## 2018-04-26 DIAGNOSIS — M79674 Pain in right toe(s): Secondary | ICD-10-CM

## 2018-04-26 DIAGNOSIS — L84 Corns and callosities: Secondary | ICD-10-CM

## 2018-04-26 DIAGNOSIS — M79675 Pain in left toe(s): Secondary | ICD-10-CM

## 2018-04-26 DIAGNOSIS — G35D Multiple sclerosis, unspecified: Secondary | ICD-10-CM

## 2018-04-26 NOTE — Patient Instructions (Signed)

## 2018-04-26 NOTE — Progress Notes (Signed)
Subjective: Deanna Wall presents with  cc of painful, discolored, thick toenails calluses which interfere with activities of daily living. Pain is aggravated when wearing enclosed shoe gear. Pain is relieved with periodic professional debridement.  She has history of multiple sclerosis  Most symptomatic today are her left great toe which she feels is an ingrown toenail and a corn on her left fifth digit.  Pain is located on the medial and lateral aspects of the left great.  She denies any swelling, redness, or drainage.  Area is just painful when she puts on certain shoes.  Corwin Levins, MD is her PCP.Last visit with him was November 04, 2017   Current Outpatient Medications:  .  amLODipine (NORVASC) 5 MG tablet, TAKE 1 TABLET BY MOUTH ONCE DAILY, Disp: 90 tablet, Rfl: 1 .  aspirin 81 MG tablet, Take 81 mg by mouth daily.  , Disp: , Rfl:  .  citalopram (CELEXA) 10 MG tablet, Take 1 tablet (10 mg total) by mouth daily., Disp: 90 tablet, Rfl: 3 .  cloNIDine (CATAPRES) 0.3 MG tablet, Take 1 tablet (0.3 mg total) by mouth 2 (two) times daily., Disp: 180 tablet, Rfl: 3 .  ezetimibe (ZETIA) 10 MG tablet, TAKE 1 TABLET BY MOUTH ONCE DAILY, Disp: 90 tablet, Rfl: 3 .  furosemide (LASIX) 20 MG tablet, TAKE 1 TABLET BY MOUTH ONCE DAILY AS NEEDED FOR  SWELLING, Disp: 90 tablet, Rfl: 0 .  latanoprost (XALATAN) 0.005 % ophthalmic solution, INSTILL 1 DROP IN BOTH EYES AT BEDTIME. KEEP THE BOTTLE IN THE FRIDGE UNTIL YOU OPEN IT., Disp: , Rfl: 3 .  levocetirizine (XYZAL) 5 MG tablet, TAKE 1 TABLET BY MOUTH ONCE DAILY IN THE EVENING, Disp: 90 tablet, Rfl: 1 .  nitrofurantoin (MACRODANTIN) 50 MG capsule, TAKE 1 CAPSULE BY MOUTH ONCE DAILY, Disp: 30 capsule, Rfl: 11 .  potassium chloride (K-DUR) 10 MEQ tablet, Take 1 tablet (10 mEq total) by mouth daily., Disp: 90 tablet, Rfl: 3 .  SIMBRINZA 1-0.2 % SUSP, , Disp: , Rfl: 4 .  timolol (TIMOPTIC) 0.5 % ophthalmic solution, INSTILL ONE DROP IN [facility] EYE ONCE DAILY  IN THE MORNING, Disp: , Rfl: 4 .  vitamin B-12 (CYANOCOBALAMIN) 500 MCG tablet, Take 500 mcg by mouth daily., Disp: , Rfl:   Allergies  Allergen Reactions  . Atorvastatin     REACTION: feels funny in face and head    Vascular Examination: Capillary refill time <3 seconds x 10 digits Dorsalis pedis and Posterior tibial pulses present b/l No digital hair x 10 digits Skin temperature warm to cool b/l  Dermatological Examination: Skin with normal turgor, texture and tone b/l  Toenails 1-5 b/l discolored, thick, dystrophic with subungual debris and pain with palpation to nailbeds due to thickness of nails.  Hyperkeratotic lesion medial aspect of left posterior heel, dorsal aspect left fifth PIPJ, medial right hallux IPJ, and first metatarsal head right foot.  There is no erythema, no edema, no drainage, no flocculence noted.  Musculoskeletal: Muscle strength 5/5 to all LE muscle groups  Neurological: Sensation diminished with 10 gram monofilament. Vibratory sensation diminished  Assessment: 1. Painful onychomycosis toenails 1-5 b/l 2. Callus medial aspect of left posterior heel, dorsal aspect left fifth PIPJ, medial right hallux IPJ, and first metatarsal head right foot 3. Multiple sclerosis  Plan: 1. Continue diabetic foot care principles.  2. Toenails 1-5 b/l were debrided in length and girth without iatrogenic bleeding. 3. Hyperkeratotic lesions pared with sterile blade medial aspect of left  posterior heel, dorsal aspect left fifth PIPJ, medial right hallux IPJ, and first metatarsal head right foot. 4. Patient to continue soft, supportive shoe gear 5. Patient to report any pedal injuries to medical professional  6. Follow up 3 months. Patient/POA to call should there be a concern in the interim.

## 2018-05-04 ENCOUNTER — Other Ambulatory Visit (INDEPENDENT_AMBULATORY_CARE_PROVIDER_SITE_OTHER): Payer: Medicare Other

## 2018-05-04 ENCOUNTER — Ambulatory Visit (INDEPENDENT_AMBULATORY_CARE_PROVIDER_SITE_OTHER): Payer: Medicare Other | Admitting: Internal Medicine

## 2018-05-04 ENCOUNTER — Encounter: Payer: Self-pay | Admitting: Internal Medicine

## 2018-05-04 VITALS — BP 124/82 | HR 42 | Temp 97.9°F | Ht 67.0 in

## 2018-05-04 DIAGNOSIS — R001 Bradycardia, unspecified: Secondary | ICD-10-CM | POA: Diagnosis not present

## 2018-05-04 DIAGNOSIS — Z0000 Encounter for general adult medical examination without abnormal findings: Secondary | ICD-10-CM | POA: Diagnosis not present

## 2018-05-04 DIAGNOSIS — R7302 Impaired glucose tolerance (oral): Secondary | ICD-10-CM | POA: Diagnosis not present

## 2018-05-04 LAB — HEPATIC FUNCTION PANEL
ALBUMIN: 3.6 g/dL (ref 3.5–5.2)
ALT: 10 U/L (ref 0–35)
AST: 14 U/L (ref 0–37)
Alkaline Phosphatase: 74 U/L (ref 39–117)
Bilirubin, Direct: 0.2 mg/dL (ref 0.0–0.3)
TOTAL PROTEIN: 6.9 g/dL (ref 6.0–8.3)
Total Bilirubin: 0.6 mg/dL (ref 0.2–1.2)

## 2018-05-04 LAB — URINALYSIS, ROUTINE W REFLEX MICROSCOPIC
Bilirubin Urine: NEGATIVE
Hgb urine dipstick: NEGATIVE
KETONES UR: NEGATIVE
Nitrite: NEGATIVE
RBC / HPF: NONE SEEN (ref 0–?)
Specific Gravity, Urine: 1.01 (ref 1.000–1.030)
TOTAL PROTEIN, URINE-UPE24: NEGATIVE
UROBILINOGEN UA: 0.2 (ref 0.0–1.0)
Urine Glucose: NEGATIVE
pH: 6.5 (ref 5.0–8.0)

## 2018-05-04 LAB — CBC WITH DIFFERENTIAL/PLATELET
BASOS PCT: 1.1 % (ref 0.0–3.0)
Basophils Absolute: 0.1 10*3/uL (ref 0.0–0.1)
Eosinophils Absolute: 0.1 10*3/uL (ref 0.0–0.7)
Eosinophils Relative: 1.1 % (ref 0.0–5.0)
HCT: 35.2 % — ABNORMAL LOW (ref 36.0–46.0)
Hemoglobin: 11.6 g/dL — ABNORMAL LOW (ref 12.0–15.0)
Lymphocytes Relative: 32.9 % (ref 12.0–46.0)
Lymphs Abs: 2 10*3/uL (ref 0.7–4.0)
MCHC: 33 g/dL (ref 30.0–36.0)
MCV: 85.7 fl (ref 78.0–100.0)
Monocytes Absolute: 0.5 10*3/uL (ref 0.1–1.0)
Monocytes Relative: 7.9 % (ref 3.0–12.0)
Neutro Abs: 3.4 10*3/uL (ref 1.4–7.7)
Neutrophils Relative %: 57 % (ref 43.0–77.0)
Platelets: 308 10*3/uL (ref 150.0–400.0)
RBC: 4.11 Mil/uL (ref 3.87–5.11)
RDW: 14.5 % (ref 11.5–15.5)
WBC: 5.9 10*3/uL (ref 4.0–10.5)

## 2018-05-04 LAB — BASIC METABOLIC PANEL
BUN: 13 mg/dL (ref 6–23)
CO2: 25 mEq/L (ref 19–32)
CREATININE: 0.87 mg/dL (ref 0.40–1.20)
Calcium: 8.7 mg/dL (ref 8.4–10.5)
Chloride: 108 mEq/L (ref 96–112)
GFR: 74.98 mL/min (ref >60.00)
Glucose, Bld: 129 mg/dL — ABNORMAL HIGH (ref 70–99)
Potassium: 3.9 mEq/L (ref 3.5–5.1)
Sodium: 141 mEq/L (ref 135–145)

## 2018-05-04 LAB — LIPID PANEL
CHOLESTEROL: 168 mg/dL (ref 0–200)
HDL: 45.7 mg/dL (ref >39.00)
LDL Cholesterol: 107 mg/dL — ABNORMAL HIGH (ref 0–99)
NonHDL: 122.76
TRIGLYCERIDES: 79 mg/dL (ref 0.0–149.0)
Total CHOL/HDL Ratio: 4
VLDL: 15.8 mg/dL (ref 0.0–40.0)

## 2018-05-04 LAB — TSH: TSH: 2.74 u[IU]/mL (ref 0.35–4.50)

## 2018-05-04 LAB — HEMOGLOBIN A1C: Hgb A1c MFr Bld: 6.3 % (ref 4.6–6.5)

## 2018-05-04 NOTE — Assessment & Plan Note (Signed)
stable overall by history and exam, recent data reviewed with pt, and pt to continue medical treatment as before,  to f/u any worsening symptoms or concerns  

## 2018-05-04 NOTE — Assessment & Plan Note (Signed)

## 2018-05-04 NOTE — Progress Notes (Signed)
Subjective:    Patient ID: Deanna Wall, female    DOB: 02/03/1932, 85 y.o.   MRN: [id number]  HPI  Here for wellness and f/u;  Overall doing ok;  Pt denies Chest pain, worsening SOB, DOE, wheezing, orthopnea, PND, worsening LE edema, palpitations, dizziness or syncope.  Pt denies neurological change such as new headache, facial or extremity weakness.  Pt denies polydipsia, polyuria, or low sugar symptoms. Pt states overall good compliance with treatment and medications except only taking the fluid pill qod, has good tolerability, and has been trying to follow appropriate diet.  Pt denies worsening depressive symptoms, suicidal ideation or panic. No fever, night sweats, wt loss, loss of appetite, or other constitutional symptoms.  Pt states good ability with ADL's, has mod to high fall risk but still able to walk from room to room at home with walker, home safety reviewed and adequate, no other significant changes in hearing or vision, and only occasionally active with exercise. No other new complaints BP Readings from Last 3 Encounters:  05/04/18 124/82  11/04/17 136/84  10/21/17 124/80   Past Medical History:  Diagnosis Date  . ANEMIA, PERNICIOUS 01/30/2009  . Cataracts, bilateral   . DEGENERATIVE DISC DISEASE 10/28/2006  . ECZEMA 10/28/2006  . Gait disorder   . Glaucoma   . HYPERLIPIDEMIA 02/16/2010  . HYPERTENSION 10/28/2006  . Impaired glucose tolerance 12/31/2010  . MS (multiple sclerosis) (HCC)   . Multiple sclerosis (HCC) 10/28/2006  . Obesity   . OSTEOARTHRITIS, KNEES, BILATERAL, SEVERE 06/14/2009  . Sebaceous cyst    chest   . VITAMIN B12 DEFICIENCY 06/14/2009   Past Surgical History:  Procedure Laterality Date  . COLONOSCOPY    . TUBAL LIGATION  1958  . UPPER GASTROINTESTINAL ENDOSCOPY      reports that she has never smoked. She has never used smokeless tobacco. She reports that she does not drink alcohol or use drugs. family history includes Arthritis in her mother and  sister; Bone cancer in her mother; Cancer in her mother; Diabetes in her sister. Allergies  Allergen Reactions  . Atorvastatin     REACTION: feels funny in face and head   Review of Systems Constitutional: Negative for other unusual diaphoresis, sweats, appetite or weight changes HENT: Negative for other worsening hearing loss, ear pain, facial swelling, mouth sores or neck stiffness.   Eyes: Negative for other worsening pain, redness or other visual disturbance.  Respiratory: Negative for other stridor or swelling Cardiovascular: Negative for other palpitations or other chest pain  Gastrointestinal: Negative for worsening diarrhea or loose stools, blood in stool, distention or other pain Genitourinary: Negative for hematuria, flank pain or other change in urine volume.  Musculoskeletal: Negative for myalgias or other joint swelling.  Skin: Negative for other color change, or other wound or worsening drainage.  Neurological: Negative for other syncope or numbness. Hematological: Negative for other adenopathy or swelling Psychiatric/Behavioral: Negative for hallucinations, other worsening agitation, SI, self-injury, or new decreased concentration All other system neg per pt    Objective:   Physical Exam BP 124/82   Pulse (!) 42   Temp 97.9 F (36.6 C) (Oral)   Ht 5\' 7"  (1.702 m)   SpO2 94%   BMI 40.41 kg/m  VS noted, obese Constitutional: Pt is oriented to person, place, and time. Appears well-developed and well-nourished, in no significant distress and comfortable Head: Normocephalic and atraumatic  Eyes: Conjunctivae and EOM are normal. Pupils are equal, round, and reactive to light Right  Ear: External ear normal without discharge Left Ear: External ear normal without discharge Nose: Nose without discharge or deformity Mouth/Throat: Oropharynx is without other ulcerations and moist  Neck: Normal range of motion. Neck supple. No JVD present. No tracheal deviation present or  significant neck LA or mass Cardiovascular: Normal rate, regular rhythm, normal heart sounds and intact distal pulses.   Pulmonary/Chest: WOB normal and breath sounds without rales or wheezing  Abdominal: Soft. Bowel sounds are normal. NT. No HSM  Musculoskeletal: Normal range of motion. Exhibits no edema Lymphadenopathy: Has no other cervical adenopathy.  Neurological: Pt is alert and oriented to person, place, and time. Pt has normal reflexes. No cranial nerve deficit. Motor grossly intact, Gait intact Skin: Skin is warm and dry. No rash noted or new ulcerations Psychiatric:  Has normal mood and affect. Behavior is normal without agitation No other exam findings      Assessment & Plan:

## 2018-05-04 NOTE — Patient Instructions (Signed)
Please continue all other medications as before, and refills have been done if requested.  Please have the pharmacy call with any other refills you may need.  Please continue your efforts at being more active, low cholesterol diet, and weight control.  You are otherwise up to date with prevention measures today.  Please keep your appointments with your specialists as you may have planned  You will be contacted regarding the referral for: Echocardiogram, and Cardiology  Please go to the LAB in the Basement (turn left off the elevator) for the tests to be done today  You will be contacted by phone if any changes need to be made immediately.  Otherwise, you will receive a letter about your results with an explanation, but please check with MyChart first.  Please remember to sign up for MyChart if you have not done so, as this will be important to you in the future with finding out test results, communicating by private email, and scheduling acute appointments online when needed.  Please return in 6 months, or sooner if needed

## 2018-05-04 NOTE — Assessment & Plan Note (Signed)
asympt but persistent  ? Related to eye drops, for echo, refer cardiology

## 2018-05-05 ENCOUNTER — Encounter: Payer: Self-pay | Admitting: Internal Medicine

## 2018-05-10 ENCOUNTER — Ambulatory Visit: Payer: Medicare Other | Admitting: Cardiovascular Disease

## 2018-05-13 ENCOUNTER — Other Ambulatory Visit (HOSPITAL_COMMUNITY): Payer: Medicare Other

## 2018-05-18 ENCOUNTER — Ambulatory Visit: Payer: Medicare Other | Admitting: Cardiovascular Disease

## 2018-05-18 ENCOUNTER — Encounter: Payer: Self-pay | Admitting: Cardiovascular Disease

## 2018-05-18 VITALS — BP 118/68 | HR 44 | Ht 67.0 in | Wt 250.0 lb

## 2018-05-18 DIAGNOSIS — I10 Essential (primary) hypertension: Secondary | ICD-10-CM

## 2018-05-18 DIAGNOSIS — R001 Bradycardia, unspecified: Secondary | ICD-10-CM

## 2018-05-18 DIAGNOSIS — E782 Mixed hyperlipidemia: Secondary | ICD-10-CM

## 2018-05-18 NOTE — Assessment & Plan Note (Signed)
History of essential hypertension with blood pressure measured today at 118/68.  She is on amlodipine, and clonidine.

## 2018-05-18 NOTE — Assessment & Plan Note (Signed)
History of hyperlipidemia on Zetia with lipid profile performed 05/04/2018 revealing total cluster 168, LDL of 107 and HDL 45

## 2018-05-18 NOTE — Assessment & Plan Note (Signed)
History of asymptomatic bradycardia with heart rates in the 40s.  She attributes this to eyedrops which she recently started by her optometrist, Dr. Adair Patter, but in review of her eyedrops there do not seem to be any that contain beta-blockers.  In any event, she is asymptomatic from this and no further evaluation or therapy needs to be performed.

## 2018-05-18 NOTE — Progress Notes (Signed)
05/18/2018 Deanna Wall   1931/04/16  [id number]  Primary Physician Corwin Levins, MD Primary Cardiologist: Runell Gess MD Nicholes Calamity, [place]  HPI:  Deanna Wall is a 85 y.o. moderately overweight married African-American female mother of 5 children, grandmother of 82 grandchildren is accompanied by her daughter Synetta Fail today.  She was referred by Dr. Oliver Barre for cardiovascular dilation because of asymptomatic sinus bradycardia.  She does have a history of treated hypertension hyperlipidemia.  She is never had a heart attack or stroke.  She denies chest pain or shortness of breath.  She was diagnosed with multiple sclerosis in 1973 and has been wheelchair-bound for the last 15 years although she does ambulate around her house with a walker.  She was noted to be bradycardic on routine exam by her PCP recently with a heart rate in the 40s.   Current Meds  Medication Sig  . amLODipine (NORVASC) 5 MG tablet TAKE 1 TABLET BY MOUTH ONCE DAILY  . aspirin 81 MG tablet Take 81 mg by mouth daily.    . citalopram (CELEXA) 10 MG tablet Take 1 tablet (10 mg total) by mouth daily.  . cloNIDine (CATAPRES) 0.3 MG tablet Take 1 tablet (0.3 mg total) by mouth 2 (two) times daily.  Marland Kitchen ezetimibe (ZETIA) 10 MG tablet TAKE 1 TABLET BY MOUTH ONCE DAILY  . furosemide (LASIX) 20 MG tablet TAKE 1 TABLET BY MOUTH ONCE DAILY AS NEEDED FOR  SWELLING  . latanoprost (XALATAN) 0.005 % ophthalmic solution INSTILL 1 DROP IN BOTH EYES AT BEDTIME. KEEP THE BOTTLE IN THE FRIDGE UNTIL YOU OPEN IT.  Marland Kitchen potassium chloride (K-DUR) 10 MEQ tablet Take 1 tablet (10 mEq total) by mouth daily.  Marland Kitchen SIMBRINZA 1-0.2 % SUSP   . vitamin B-12 (CYANOCOBALAMIN) 500 MCG tablet Take 500 mcg by mouth daily.     Allergies  Allergen Reactions  . Atorvastatin     REACTION: feels funny in face and head    Social History   Socioeconomic History  . Marital status: Married    Spouse name: Not on file  . Number of children: 5    . Years of education: 11TH   . Highest education level: Not on file  Occupational History  . Occupation: homemaker    Employer: RETIRED  Social Needs  . Financial resource strain: Not on file  . Food insecurity:    Worry: Not on file    Inability: Not on file  . Transportation needs:    Medical: Not on file    Non-medical: Not on file  Tobacco Use  . Smoking status: Never Smoker  . Smokeless tobacco: Never Used  Substance and Sexual Activity  . Alcohol use: No  . Drug use: No  . Sexual activity: Not on file  Lifestyle  . Physical activity:    Days per week: Not on file    Minutes per session: Not on file  . Stress: Not on file  Relationships  . Social connections:    Talks on phone: Not on file    Gets together: Not on file    Attends religious service: Not on file    Active member of club or organization: Not on file    Attends meetings of clubs or organizations: Not on file    Relationship status: Not on file  . Intimate partner violence:    Fear of current or ex partner: Not on file    Emotionally abused: Not  on file    Physically abused: Not on file    Forced sexual activity: Not on file  Other Topics Concern  . Not on file  Social History Narrative  . Not on file     Review of Systems: General: negative for chills, fever, night sweats or weight changes.  Cardiovascular: negative for chest pain, dyspnea on exertion, edema, orthopnea, palpitations, paroxysmal nocturnal dyspnea or shortness of breath Dermatological: negative for rash Respiratory: negative for cough or wheezing Urologic: negative for hematuria Abdominal: negative for nausea, vomiting, diarrhea, bright red blood per rectum, melena, or hematemesis Neurologic: negative for visual changes, syncope, or dizziness All other systems reviewed and are otherwise negative except as noted above.    Blood pressure 118/68, pulse (!) 44, height 5\' 7"  (1.702 m), weight 250 lb (113.4 kg).  General  appearance: alert and no distress Neck: no adenopathy, no carotid bruit, no JVD, supple, symmetrical, trachea midline and thyroid not enlarged, symmetric, no tenderness/mass/nodules Lungs: clear to auscultation bilaterally Heart: regular rate and rhythm, S1, S2 normal, no murmur, click, rub or gallop Extremities: extremities normal, atraumatic, no cyanosis or edema Pulses: 2+ and symmetric Skin: Skin color, texture, turgor normal. No rashes or lesions Neurologic: Alert and oriented X 3, normal strength and tone. Normal symmetric reflexes. Normal coordination and gait  EKG sinus bradycardia 44 without ST or T wave changes.  I personally reviewed this EKG.  ASSESSMENT AND PLAN:   Hyperlipidemia History of hyperlipidemia on Zetia with lipid profile performed 05/04/2018 revealing total cluster 168, LDL of 107 and HDL 45  Essential hypertension History of essential hypertension with blood pressure measured today at 118/68.  She is on amlodipine, and clonidine.  Bradycardia History of asymptomatic bradycardia with heart rates in the 40s.  She attributes this to eyedrops which she recently started by her optometrist, Dr. Adair Patter, but in review of her eyedrops there do not seem to be any that contain beta-blockers.  In any event, she is asymptomatic from this and no further evaluation or therapy needs to be performed.      Runell Gess MD FACP,FACC,FAHA, [facility] 05/18/2018 9:37 AM

## 2018-05-18 NOTE — Patient Instructions (Signed)
Medication Instructions:  Your physician recommends that you continue on your current medications as directed. Please refer to the Current Medication list given to you today.  If you need a refill on your cardiac medications before your next appointment, please call your pharmacy.   Lab work: NONE If you have labs (blood work) drawn today and your tests are completely normal, you will receive your results only by: . MyChart Message (if you have MyChart) OR . A paper copy in the mail If you have any lab test that is abnormal or we need to change your treatment, we will call you to review the results.  Testing/Procedures: NONE  Follow-Up: At [facility], you and your health needs are our priority.  As part of our continuing mission to provide you with exceptional heart care, we have created designated Provider Care Teams.  These Care Teams include your primary Cardiologist (physician) and Advanced Practice Providers (APPs -  Physician Assistants and Nurse Practitioners) who all work together to provide you with the care you need, when you need it. . You may schedule a follow up appointment AS NEEDED. You may see Dr. Berry or one of the following Advanced Practice Providers on your designated Care Team:   . Luke Kilroy, PA-C . Hao Meng, PA-C . Angela Duke, PA-C . Kathryn Lawrence, DNP . Rhonda Barrett, PA-C . Krista Kroeger, PA-C . Callie Goodrich, PA-C    

## 2018-05-19 ENCOUNTER — Encounter: Payer: Self-pay | Admitting: Internal Medicine

## 2018-05-19 ENCOUNTER — Ambulatory Visit (HOSPITAL_COMMUNITY): Payer: Medicare Other | Attending: Internal Medicine

## 2018-05-19 DIAGNOSIS — R001 Bradycardia, unspecified: Secondary | ICD-10-CM | POA: Diagnosis not present

## 2018-05-30 ENCOUNTER — Other Ambulatory Visit: Payer: Self-pay | Admitting: Internal Medicine

## 2018-06-16 ENCOUNTER — Other Ambulatory Visit: Payer: Self-pay | Admitting: Internal Medicine

## 2018-07-26 ENCOUNTER — Ambulatory Visit: Payer: Medicare Other | Admitting: Podiatry

## 2018-07-27 ENCOUNTER — Other Ambulatory Visit: Payer: Self-pay | Admitting: Internal Medicine

## 2018-09-21 ENCOUNTER — Other Ambulatory Visit: Payer: Self-pay | Admitting: Internal Medicine

## 2018-09-21 DIAGNOSIS — Z1231 Encounter for screening mammogram for malignant neoplasm of breast: Secondary | ICD-10-CM | POA: Diagnosis not present

## 2018-09-21 LAB — HM MAMMOGRAPHY

## 2018-09-21 NOTE — Telephone Encounter (Signed)
Done erx for prevention UTI

## 2018-09-27 DIAGNOSIS — H47233 Glaucomatous optic atrophy, bilateral: Secondary | ICD-10-CM | POA: Diagnosis not present

## 2018-09-27 DIAGNOSIS — I10 Essential (primary) hypertension: Secondary | ICD-10-CM | POA: Diagnosis not present

## 2018-09-27 DIAGNOSIS — H35373 Puckering of macula, bilateral: Secondary | ICD-10-CM | POA: Diagnosis not present

## 2018-09-27 DIAGNOSIS — H401232 Low-tension glaucoma, bilateral, moderate stage: Secondary | ICD-10-CM | POA: Diagnosis not present

## 2018-10-19 ENCOUNTER — Other Ambulatory Visit: Payer: Self-pay

## 2018-10-19 DIAGNOSIS — Z20822 Contact with and (suspected) exposure to covid-19: Secondary | ICD-10-CM

## 2018-10-21 LAB — NOVEL CORONAVIRUS, NAA: SARS-CoV-2, NAA: NOT DETECTED

## 2018-11-02 ENCOUNTER — Other Ambulatory Visit: Payer: Self-pay

## 2018-11-02 ENCOUNTER — Encounter: Payer: Self-pay | Admitting: Internal Medicine

## 2018-11-02 ENCOUNTER — Ambulatory Visit (INDEPENDENT_AMBULATORY_CARE_PROVIDER_SITE_OTHER): Payer: Medicare Other | Admitting: Internal Medicine

## 2018-11-02 VITALS — BP 128/86 | HR 57 | Temp 98.2°F | Ht 67.0 in

## 2018-11-02 DIAGNOSIS — I10 Essential (primary) hypertension: Secondary | ICD-10-CM | POA: Diagnosis not present

## 2018-11-02 DIAGNOSIS — M19041 Primary osteoarthritis, right hand: Secondary | ICD-10-CM | POA: Diagnosis not present

## 2018-11-02 DIAGNOSIS — R269 Unspecified abnormalities of gait and mobility: Secondary | ICD-10-CM

## 2018-11-02 DIAGNOSIS — R7302 Impaired glucose tolerance (oral): Secondary | ICD-10-CM

## 2018-11-02 DIAGNOSIS — M19049 Primary osteoarthritis, unspecified hand: Secondary | ICD-10-CM | POA: Insufficient documentation

## 2018-11-02 DIAGNOSIS — M19042 Primary osteoarthritis, left hand: Secondary | ICD-10-CM

## 2018-11-02 DIAGNOSIS — G35 Multiple sclerosis: Secondary | ICD-10-CM | POA: Diagnosis not present

## 2018-11-02 LAB — POCT GLYCOSYLATED HEMOGLOBIN (HGB A1C): Hemoglobin A1C: 6.1 % — AB (ref 4.0–5.6)

## 2018-11-02 MED ORDER — DICLOFENAC SODIUM 1 % TD GEL: 2.0000 g | Freq: Four times a day (QID) | TRANSDERMAL | 5 refills | Status: AC | PRN

## 2018-11-02 NOTE — Patient Instructions (Signed)
Please take all new medication as prescribed - the voltaren gel as needed for pain  Your A1c was OK today  Please continue all other medications as before, and refills have been done if requested.  Please have the pharmacy call with any other refills you may need.  Please continue your efforts at being more active, low cholesterol diet, and weight control.  You are otherwise up to date with prevention measures today.  Please keep your appointments with your specialists as you may have planned  You will be contacted regarding the referral for: [facility] for home nurse and physical therapy  We can hold on other lab work today  Please return in 6 months, or sooner if needed

## 2018-11-02 NOTE — Progress Notes (Signed)
Subjective:    Patient ID: Deanna Wall, female    DOB: 22-Oct-1931, 85 y.o.   MRN: [id number]  HPI  Here to f/u; overall doing ok,  Pt denies chest pain, increasing sob or doe, wheezing, orthopnea, PND, increased LE swelling, palpitations, dizziness or syncope.  Pt denies new neurological symptoms such as new headache, or facial or extremity weakness or numbness.  Pt denies polydipsia, polyuria, or low sugar episode.  Pt states overall good compliance with meds, mostly trying to follow appropriate diet, with wt overall stable,  but little exercise however. Remains wheelchair bound for out of the home, but does walk with walker at home, though more difficult recently, no falls, has not had PT recently.  Also c/o bilat finger joint arthritic pains, mild to mod, intermittent, x 6 mo .cphx Current Outpatient Medications on File Prior to Visit  Medication Sig Dispense Refill  . amLODipine (NORVASC) 5 MG tablet Take 1 tablet by mouth once daily 90 tablet 1  . aspirin 81 MG tablet Take 81 mg by mouth daily.      . citalopram (CELEXA) 10 MG tablet Take 1 tablet (10 mg total) by mouth daily. 90 tablet 3  . cloNIDine (CATAPRES) 0.3 MG tablet Take 1 tablet (0.3 mg total) by mouth 2 (two) times daily. 180 tablet 3  . ezetimibe (ZETIA) 10 MG tablet TAKE 1 TABLET BY MOUTH ONCE DAILY 90 tablet 3  . furosemide (LASIX) 20 MG tablet TAKE 1 TABLET BY MOUTH ONCE DAILY AS NEEDED FOR  SWELLING 90 tablet 0  . latanoprost (XALATAN) 0.005 % ophthalmic solution INSTILL 1 DROP IN BOTH EYES AT BEDTIME. KEEP THE BOTTLE IN THE FRIDGE UNTIL YOU OPEN IT.  3  . nitrofurantoin (MACRODANTIN) 50 MG capsule Take 1 capsule by mouth once daily 30 capsule 3  . potassium chloride (K-DUR) 10 MEQ tablet Take 1 tablet (10 mEq total) by mouth daily. 90 tablet 3  . SIMBRINZA 1-0.2 % SUSP   4  . vitamin B-12 (CYANOCOBALAMIN) 500 MCG tablet Take 500 mcg by mouth daily.     No current facility-administered medications on file prior to  visit.    Review of Systems  Constitutional: Negative for other unusual diaphoresis or sweats HENT: Negative for ear discharge or swelling Eyes: Negative for other worsening visual disturbances Respiratory: Negative for stridor or other swelling  Gastrointestinal: Negative for worsening distension or other blood Genitourinary: Negative for retention or other urinary change Musculoskeletal: Negative for other MSK pain or swelling Skin: Negative for color change or other new lesions Neurological: Negative for worsening tremors and other numbness  Psychiatric/Behavioral: Negative for worsening agitation or other fatigue All other system neg per pt    Objective:   Physical Exam BP 128/86   Pulse (!) 57   Temp 98.2 F (36.8 C) (Oral)   Ht 5\' 7"  (1.702 m)   SpO2 96%   BMI 39.16 kg/m  VS noted, in wheelchair, unable to stand well Constitutional: Pt appears in NAD HENT: Head: NCAT.  Right Ear: External ear normal.  Left Ear: External ear normal.  Eyes: . Pupils are equal, round, and reactive to light. Conjunctivae and EOM are normal Nose: without d/c or deformity Neck: Neck supple. Gross normal ROM Cardiovascular: Normal rate and regular rhythm.   Pulmonary/Chest: Effort normal and breath sounds without rales or wheezing.  Abd:  Soft, NT, ND, + BS, no organomegaly Hand OA changes noted nontender Neurological: Pt is alert. At baseline orientation, motor grossly intact  Skin: Skin is warm. No rashes, other new lesions, trace bialt pedal edema Psychiatric: Pt behavior is normal without agitation  No other exam findings  Contains abnormal data POCT glycosylated hemoglobin (Hb A1C) Order: [id number] Status:  Final result Visible to patient:  No (not released) Dx:  Impaired glucose tolerance  Ref Range & Units 10:03 (11/02/18) 67mo ago (05/04/18) 23yr ago (10/21/17) 68yr ago (05/06/17) 36yr ago (11/04/16) 14yr ago (05/13/16) 91yr ago (05/14/15)  Hemoglobin A1C 4.0 - 5.6 % 6.1Abnormal   6.3  R, CM  6.3 R, CM  6.2 R, CM  6.4 R, CM  6.2 R, CM  6.3 R, CM            Assessment & Plan:

## 2018-11-05 ENCOUNTER — Encounter: Payer: Self-pay | Admitting: Internal Medicine

## 2018-11-05 NOTE — Assessment & Plan Note (Signed)
Ok for volt gel prn,  to f/u any worsening symptoms or concerns 

## 2018-11-05 NOTE — Assessment & Plan Note (Signed)
For PT as above 

## 2018-11-05 NOTE — Assessment & Plan Note (Signed)
stable overall by history and exam, recent data reviewed with pt, and pt to continue medical treatment as before,  to f/u any worsening symptoms or concerns  

## 2018-11-05 NOTE — Assessment & Plan Note (Signed)
With worsening mobility, for [facility] with RN and PT, continue neuro f/u

## 2018-11-10 ENCOUNTER — Telehealth: Payer: Self-pay

## 2018-11-10 MED ORDER — POLYETHYLENE GLYCOL 3350 17 GM/SCOOP PO POWD: 17.0000 g | Freq: Two times a day (BID) | ORAL | 1 refills | Status: AC | PRN

## 2018-11-10 NOTE — Telephone Encounter (Signed)
Ok for miralax asd - done erx 

## 2018-11-10 NOTE — Telephone Encounter (Signed)
Copied from [place]. Topic: General - Other >> Nov 10, 2018 10:48 AM Jodie Echevaria wrote: Reason for CRM: Glenard Haring with [facility] called to say that she is out visiting patient and the heart rate is 50 but patient is asymptomatic. Also patient reported not having a BM since 11/07/2018 but is taking a substance called prune lax and Per angel wondering if the Dr wanted to prescribe something to help patient move her bowels. Any questions Glenard Haring Ph# [phone]

## 2018-11-10 NOTE — Telephone Encounter (Signed)
Informed that medication was sen t 

## 2018-11-15 ENCOUNTER — Telehealth: Payer: Self-pay | Admitting: Internal Medicine

## 2018-11-15 NOTE — Telephone Encounter (Signed)
Copied from [place] [phone]. Topic: General - Other >> Nov 15, 2018 10:12 AM Virl Axe D wrote: Reason for CRM: Kiouna with [facility] stated pt's heart rate is 48, checked twice.Pt told her Dr. Jenny Reichmann is aware it's related to pt's eye drops she's using. All her other vitals were normal and she is asymptomatic.

## 2018-11-15 NOTE — Telephone Encounter (Signed)
Ok to continue same tx

## 2018-11-15 NOTE — Telephone Encounter (Signed)
Kennieth Rad has been informed

## 2018-11-17 ENCOUNTER — Other Ambulatory Visit: Payer: Self-pay | Admitting: Internal Medicine

## 2018-11-22 DIAGNOSIS — Z9181 History of falling: Secondary | ICD-10-CM

## 2018-11-22 DIAGNOSIS — M19042 Primary osteoarthritis, left hand: Secondary | ICD-10-CM | POA: Diagnosis not present

## 2018-11-22 DIAGNOSIS — M19041 Primary osteoarthritis, right hand: Secondary | ICD-10-CM | POA: Diagnosis not present

## 2018-11-22 DIAGNOSIS — I10 Essential (primary) hypertension: Secondary | ICD-10-CM | POA: Diagnosis not present

## 2018-11-22 DIAGNOSIS — Z7982 Long term (current) use of aspirin: Secondary | ICD-10-CM

## 2018-11-22 DIAGNOSIS — R7302 Impaired glucose tolerance (oral): Secondary | ICD-10-CM

## 2018-11-22 DIAGNOSIS — G35 Multiple sclerosis: Secondary | ICD-10-CM | POA: Diagnosis not present

## 2018-11-22 DIAGNOSIS — Z79899 Other long term (current) drug therapy: Secondary | ICD-10-CM

## 2018-11-28 ENCOUNTER — Other Ambulatory Visit: Payer: Self-pay | Admitting: Internal Medicine

## 2018-11-29 ENCOUNTER — Telehealth: Payer: Self-pay

## 2018-11-29 NOTE — Telephone Encounter (Signed)
Ok to follow as  Long as she has no worsening dizziness, weakness or falls or other unusual symptoms

## 2018-11-29 NOTE — Telephone Encounter (Signed)
Noted.  Copied from [place] [phone]. Topic: General - Inquiry >> Nov 29, 2018  9:59 AM Richardo Priest, NT wrote: Reason for CRM: Ms.Hill, RN with [facility], called in stating she wanted to notify PCP and RN that patient's heart rate last week was 50. Please advise. Call back is necessary is [phone].

## 2018-11-30 NOTE — Telephone Encounter (Signed)
Pt has been notified.

## 2018-12-23 ENCOUNTER — Other Ambulatory Visit: Payer: Self-pay | Admitting: Internal Medicine

## 2018-12-26 DIAGNOSIS — H401232 Low-tension glaucoma, bilateral, moderate stage: Secondary | ICD-10-CM | POA: Diagnosis not present

## 2018-12-26 DIAGNOSIS — H16143 Punctate keratitis, bilateral: Secondary | ICD-10-CM | POA: Diagnosis not present

## 2018-12-26 DIAGNOSIS — H35373 Puckering of macula, bilateral: Secondary | ICD-10-CM | POA: Diagnosis not present

## 2018-12-26 DIAGNOSIS — H35033 Hypertensive retinopathy, bilateral: Secondary | ICD-10-CM | POA: Diagnosis not present

## 2018-12-27 ENCOUNTER — Telehealth: Payer: Self-pay | Admitting: Internal Medicine

## 2018-12-27 NOTE — Telephone Encounter (Signed)
Patient's daughter, [name], dropped off FMLA to be completed for her regarding her father (Deanna Wall) and mother (Deanna Wall) who are both patients of Dr John. She needs this to take them to their doctor appointments. This has been completed before. °Please fax to [phone] when completed and mail a copy to the patient's home address.  °Placed in [name]'s box for completion. ° °

## 2018-12-27 NOTE — Telephone Encounter (Signed)
Forms have been completed & Placed in providers box to review and sign.  

## 2018-12-29 NOTE — Telephone Encounter (Signed)
Forms have been signed, Faxed to number below as requested. Copy sent to scan &Charged for once under husband Camila Li).  Original mailed as requested.

## 2019-02-07 ENCOUNTER — Other Ambulatory Visit: Payer: Self-pay | Admitting: Internal Medicine

## 2019-03-10 ENCOUNTER — Other Ambulatory Visit: Payer: Self-pay | Admitting: Internal Medicine

## 2019-04-04 DIAGNOSIS — H35033 Hypertensive retinopathy, bilateral: Secondary | ICD-10-CM | POA: Diagnosis not present

## 2019-04-04 DIAGNOSIS — H35373 Puckering of macula, bilateral: Secondary | ICD-10-CM | POA: Diagnosis not present

## 2019-04-04 DIAGNOSIS — H401232 Low-tension glaucoma, bilateral, moderate stage: Secondary | ICD-10-CM | POA: Diagnosis not present

## 2019-04-04 DIAGNOSIS — H16143 Punctate keratitis, bilateral: Secondary | ICD-10-CM | POA: Diagnosis not present

## 2019-04-10 ENCOUNTER — Other Ambulatory Visit: Payer: Self-pay | Admitting: Internal Medicine

## 2019-05-10 ENCOUNTER — Other Ambulatory Visit: Payer: Self-pay

## 2019-05-10 ENCOUNTER — Other Ambulatory Visit: Payer: Medicare Other

## 2019-05-10 ENCOUNTER — Ambulatory Visit (INDEPENDENT_AMBULATORY_CARE_PROVIDER_SITE_OTHER): Payer: Medicare Other | Admitting: Internal Medicine

## 2019-05-10 ENCOUNTER — Encounter: Payer: Self-pay | Admitting: Internal Medicine

## 2019-05-10 VITALS — BP 118/78 | Temp 97.8°F | Ht 67.0 in | Wt 250.0 lb

## 2019-05-10 DIAGNOSIS — R7309 Other abnormal glucose: Secondary | ICD-10-CM | POA: Diagnosis not present

## 2019-05-10 DIAGNOSIS — E559 Vitamin D deficiency, unspecified: Secondary | ICD-10-CM | POA: Diagnosis not present

## 2019-05-10 DIAGNOSIS — R7302 Impaired glucose tolerance (oral): Secondary | ICD-10-CM | POA: Diagnosis not present

## 2019-05-10 DIAGNOSIS — Z0000 Encounter for general adult medical examination without abnormal findings: Secondary | ICD-10-CM

## 2019-05-10 DIAGNOSIS — E538 Deficiency of other specified B group vitamins: Secondary | ICD-10-CM

## 2019-05-10 DIAGNOSIS — D509 Iron deficiency anemia, unspecified: Secondary | ICD-10-CM

## 2019-05-10 LAB — CBC WITH DIFFERENTIAL/PLATELET
Basophils Absolute: 0.1 10*3/uL (ref 0.0–0.1)
Basophils Relative: 1.2 % (ref 0.0–3.0)
Eosinophils Absolute: 0.1 10*3/uL (ref 0.0–0.7)
Eosinophils Relative: 1.5 % (ref 0.0–5.0)
HCT: 34.9 % — ABNORMAL LOW (ref 36.0–46.0)
Hemoglobin: 11.4 g/dL — ABNORMAL LOW (ref 12.0–15.0)
Lymphocytes Relative: 31.9 % (ref 12.0–46.0)
Lymphs Abs: 1.8 10*3/uL (ref 0.7–4.0)
MCHC: 32.6 g/dL (ref 30.0–36.0)
MCV: 86.3 fl (ref 78.0–100.0)
Monocytes Absolute: 0.4 10*3/uL (ref 0.1–1.0)
Monocytes Relative: 6.2 % (ref 3.0–12.0)
Neutro Abs: 3.4 10*3/uL (ref 1.4–7.7)
Neutrophils Relative %: 59.2 % (ref 43.0–77.0)
Platelets: 319 10*3/uL (ref 150.0–400.0)
RBC: 4.05 Mil/uL (ref 3.87–5.11)
RDW: 14.9 % (ref 11.5–15.5)
WBC: 5.8 10*3/uL (ref 4.0–10.5)

## 2019-05-10 LAB — URINALYSIS, ROUTINE W REFLEX MICROSCOPIC
Bilirubin Urine: NEGATIVE
Hgb urine dipstick: NEGATIVE
Ketones, ur: NEGATIVE
Nitrite: NEGATIVE
Specific Gravity, Urine: 1.02 (ref 1.000–1.030)
Total Protein, Urine: NEGATIVE
Urine Glucose: NEGATIVE
Urobilinogen, UA: 0.2 (ref 0.0–1.0)
pH: 6.5 (ref 5.0–8.0)

## 2019-05-10 LAB — BASIC METABOLIC PANEL
BUN: 11 mg/dL (ref 6–23)
CO2: 28 mEq/L (ref 19–32)
Calcium: 8.9 mg/dL (ref 8.4–10.5)
Chloride: 107 mEq/L (ref 96–112)
Creatinine, Ser: 0.81 mg/dL (ref 0.40–1.20)
GFR: 81.23 mL/min (ref >60.00)
Glucose, Bld: 118 mg/dL — ABNORMAL HIGH (ref 70–99)
Potassium: 3.5 mEq/L (ref 3.5–5.1)
Sodium: 140 mEq/L (ref 135–145)

## 2019-05-10 LAB — HEPATIC FUNCTION PANEL
ALT: 14 U/L (ref 0–35)
AST: 17 U/L (ref 0–37)
Albumin: 3.7 g/dL (ref 3.5–5.2)
Alkaline Phosphatase: 71 U/L (ref 39–117)
Bilirubin, Direct: 0.1 mg/dL (ref 0.0–0.3)
Total Bilirubin: 0.8 mg/dL (ref 0.2–1.2)
Total Protein: 6.9 g/dL (ref 6.0–8.3)

## 2019-05-10 LAB — LIPID PANEL
Cholesterol: 183 mg/dL (ref 0–200)
HDL: 52.6 mg/dL (ref >39.00)
LDL Cholesterol: 112 mg/dL — ABNORMAL HIGH (ref 0–99)
NonHDL: 130.49
Total CHOL/HDL Ratio: 3
Triglycerides: 92 mg/dL (ref 0.0–149.0)
VLDL: 18.4 mg/dL (ref 0.0–40.0)

## 2019-05-10 LAB — IBC PANEL
Iron: 63 ug/dL (ref 42–145)
Saturation Ratios: 23 % (ref 20.0–50.0)
Transferrin: 196 mg/dL — ABNORMAL LOW (ref 212.0–360.0)

## 2019-05-10 LAB — TSH: TSH: 3.42 u[IU]/mL (ref 0.35–4.50)

## 2019-05-10 LAB — VITAMIN B12: Vitamin B-12: 445 pg/mL (ref 211–911)

## 2019-05-10 LAB — VITAMIN D 25 HYDROXY (VIT D DEFICIENCY, FRACTURES): VITD: 11.89 ng/mL — ABNORMAL LOW (ref 30.00–100.00)

## 2019-05-10 NOTE — Assessment & Plan Note (Signed)
stable overall by history and exam, recent data reviewed with pt, and pt to continue medical treatment as before,  to f/u any worsening symptoms or concerns  

## 2019-05-10 NOTE — Assessment & Plan Note (Signed)
Also for iron level 

## 2019-05-10 NOTE — Progress Notes (Signed)
Subjective:    Patient ID: Deanna Wall, female    DOB: [birth year], 86 y.o.   MRN: [id number]  HPI  Here for wellness and f/u;  Overall doing ok;  Pt denies Chest pain, worsening SOB, DOE, wheezing, orthopnea, PND, worsening LE edema, palpitations, dizziness or syncope.  Pt denies neurological change such as new headache, facial or extremity weakness.  Pt denies polydipsia, polyuria, or low sugar symptoms. Pt states overall good compliance with treatment and medications, good tolerability, and has been trying to follow appropriate diet.  Pt denies worsening depressive symptoms, suicidal ideation or panic. No fever, night sweats, wt loss, loss of appetite, or other constitutional symptoms.  Pt states good ability with ADL's, has low fall risk, home safety reviewed and adequate, no other significant changes in hearing or vision, and only occasionally active with exercise. Do the ADLs for herself but walks only few steps at home.   Past Medical History:  Diagnosis Date  . ANEMIA, PERNICIOUS 01/30/2009  . Cataracts, bilateral   . DEGENERATIVE DISC DISEASE 10/28/2006  . ECZEMA 10/28/2006  . Gait disorder   . Glaucoma   . HYPERLIPIDEMIA 02/16/2010  . HYPERTENSION 10/28/2006  . Impaired glucose tolerance 12/31/2010  . MS (multiple sclerosis) (HCC)   . Multiple sclerosis (HCC) 10/28/2006  . Obesity   . OSTEOARTHRITIS, KNEES, BILATERAL, SEVERE 06/14/2009  . Sebaceous cyst    chest   . VITAMIN B12 DEFICIENCY 06/14/2009   Past Surgical History:  Procedure Laterality Date  . COLONOSCOPY    . TUBAL LIGATION  1958  . UPPER GASTROINTESTINAL ENDOSCOPY      reports that she has never smoked. She has never used smokeless tobacco. She reports that she does not drink alcohol or use drugs. family history includes Arthritis in her mother and sister; Bone cancer in her mother; Cancer in her mother; Diabetes in her sister. Allergies  Allergen Reactions  . Atorvastatin     REACTION: feels funny in face and head     Current Outpatient Medications on File Prior to Visit  Medication Sig Dispense Refill  . amLODipine (NORVASC) 5 MG tablet Take 1 tablet by mouth once daily 90 tablet 1  . aspirin 81 MG tablet Take 81 mg by mouth daily.      . citalopram (CELEXA) 10 MG tablet Take 1 tablet by mouth once daily 90 tablet 0  . cloNIDine (CATAPRES) 0.3 MG tablet Take 1 tablet by mouth twice daily 180 tablet 1  . ezetimibe (ZETIA) 10 MG tablet Take 1 tablet (10 mg total) by mouth daily. Annual appt is due in  must see provider for future refills 30 tablet 0  . furosemide (LASIX) 20 MG tablet TAKE 1 TABLET BY MOUTH ONCE DAILY AS NEEDED FOR  SWELLING 90 tablet 1  . latanoprost (XALATAN) 0.005 % ophthalmic solution INSTILL 1 DROP IN BOTH EYES AT BEDTIME. KEEP THE BOTTLE IN THE FRIDGE UNTIL YOU OPEN IT.  3  . nitrofurantoin (MACRODANTIN) 50 MG capsule Take 1 capsule by mouth once daily 30 capsule 3  . potassium chloride (K-DUR) 10 MEQ tablet Take 1 tablet by mouth once daily 90 tablet 1  . SIMBRINZA 1-0.2 % SUSP   4  . vitamin B-12 (CYANOCOBALAMIN) 500 MCG tablet Take 500 mcg by mouth daily.    . diclofenac sodium (VOLTAREN) 1 % GEL Apply 2 g topically 4 (four) times daily as needed. (Patient not taking: Reported on 05/10/2019) 200 g 5  . polyethylene glycol powder (GLYCOLAX/MIRALAX) 17  GM/SCOOP powder Take 17 g by mouth 2 (two) times daily as needed. (Patient not taking: Reported on 05/10/2019) 3350 g 1   No current facility-administered medications on file prior to visit.   Review of Systems All otherwise neg per pt     Objective:   Physical Exam BP 118/78 (BP Location: Left Arm, Patient Position: Sitting, Cuff Size: Large)   Temp 97.8 F (36.6 C) (Oral)   Ht 5\' 7"  (1.702 m)   Wt 250 lb (113.4 kg) Comment: pt reported  BMI 39.16 kg/m  VS noted,  Constitutional: Pt appears in NAD HENT: Head: NCAT.  Right Ear: External ear normal.  Left Ear: External ear normal.  Eyes: . Pupils are equal, round, and  reactive to light. Conjunctivae and EOM are normal Nose: without d/c or deformity Neck: Neck supple. Gross normal ROM Cardiovascular: Normal rate and regular rhythm.   Pulmonary/Chest: Effort normal and breath sounds without rales or wheezing.  Abd:  Soft, NT, ND, + BS, no organomegaly Neurological: Pt is alert. At baseline orientation, motor grossly intact Skin: Skin is warm. No rashes, other new lesions, no LE edema Psychiatric: Pt behavior is normal without agitation  All otherwise neg per pt  Lab Results  Component Value Date   WBC 5.8 05/10/2019   HGB 11.4 (L) 05/10/2019   HCT 34.9 (L) 05/10/2019   PLT 319.0 05/10/2019   GLUCOSE 118 (H) 05/10/2019   CHOL 183 05/10/2019   TRIG 92.0 05/10/2019   HDL 52.60 05/10/2019   LDLDIRECT 180.0 01/29/2010   LDLCALC 112 (H) 05/10/2019   ALT 14 05/10/2019   AST 17 05/10/2019   NA 140 05/10/2019   K 3.5 05/10/2019   CL 107 05/10/2019   CREATININE 0.81 05/10/2019   BUN 11 05/10/2019   CO2 28 05/10/2019   TSH 3.42 05/10/2019   HGBA1C 6.1 (A) 11/02/2018      Assessment & Plan:

## 2019-05-10 NOTE — Patient Instructions (Signed)

## 2019-05-10 NOTE — Assessment & Plan Note (Signed)

## 2019-05-11 ENCOUNTER — Encounter: Payer: Self-pay | Admitting: Internal Medicine

## 2019-05-11 ENCOUNTER — Other Ambulatory Visit: Payer: Self-pay | Admitting: Internal Medicine

## 2019-05-11 LAB — HEMOGLOBIN A1C
Hgb A1c MFr Bld: 6.3 % of total Hgb — ABNORMAL HIGH (ref <5.7)
Mean Plasma Glucose: 134 (calc)
eAG (mmol/L): 7.4 (calc)

## 2019-05-11 MED ORDER — VITAMIN D (ERGOCALCIFEROL) 1.25 MG (50000 UNIT) PO CAPS: 50000.0000 [IU] | ORAL_CAPSULE | ORAL | 0 refills | Status: DC

## 2019-05-12 MED ORDER — EZETIMIBE 10 MG PO TABS: 10.0000 mg | ORAL_TABLET | Freq: Every day | ORAL | 11 refills | Status: DC

## 2019-05-12 NOTE — Addendum Note (Signed)
Addended by: Radford Pax M on: 05/12/2019 11:28 AM   Modules accepted: Orders

## 2019-05-26 ENCOUNTER — Other Ambulatory Visit: Payer: Self-pay | Admitting: Internal Medicine

## 2019-06-06 ENCOUNTER — Other Ambulatory Visit: Payer: Self-pay | Admitting: Internal Medicine

## 2019-06-06 NOTE — Telephone Encounter (Signed)
Please refill as per office routine med refill policy (all routine meds refilled for 3 mo or monthly per pt preference up to one year from last visit, then month to month grace period for 3 mo, then further med refills will have to be denied)  

## 2019-06-08 ENCOUNTER — Telehealth: Payer: Self-pay | Admitting: Internal Medicine

## 2019-06-08 MED ORDER — EZETIMIBE 10 MG PO TABS: 10.0000 mg | ORAL_TABLET | Freq: Every day | ORAL | 0 refills | Status: DC

## 2019-06-08 NOTE — Telephone Encounter (Signed)
Requested refill sent to pharmacy

## 2019-06-08 NOTE — Telephone Encounter (Signed)
    1.Medication Requested: ezetimibe (ZETIA) 10 MG tablet  2. Pharmacy (Name, Street, [place]):[facility] - [place], [place] - [address]  3. On Med List: yes  4. Last Visit with PCP: 05/10/19  5. Next visit date with PCP: 11/08/19   Agent: Please be advised that RX refills may take up to 3 business days. We ask that you follow-up with your pharmacy.

## 2019-06-24 ENCOUNTER — Other Ambulatory Visit: Payer: Self-pay | Admitting: Internal Medicine

## 2019-07-03 DIAGNOSIS — H35033 Hypertensive retinopathy, bilateral: Secondary | ICD-10-CM | POA: Diagnosis not present

## 2019-07-03 DIAGNOSIS — H04123 Dry eye syndrome of bilateral lacrimal glands: Secondary | ICD-10-CM | POA: Diagnosis not present

## 2019-07-03 DIAGNOSIS — H35373 Puckering of macula, bilateral: Secondary | ICD-10-CM | POA: Diagnosis not present

## 2019-07-03 DIAGNOSIS — H401232 Low-tension glaucoma, bilateral, moderate stage: Secondary | ICD-10-CM | POA: Diagnosis not present

## 2019-08-15 ENCOUNTER — Other Ambulatory Visit: Payer: Self-pay | Admitting: Internal Medicine

## 2019-08-17 ENCOUNTER — Other Ambulatory Visit: Payer: Self-pay | Admitting: Internal Medicine

## 2019-09-21 DIAGNOSIS — Z1231 Encounter for screening mammogram for malignant neoplasm of breast: Secondary | ICD-10-CM | POA: Diagnosis not present

## 2019-10-02 DIAGNOSIS — H11823 Conjunctivochalasis, bilateral: Secondary | ICD-10-CM | POA: Diagnosis not present

## 2019-10-02 DIAGNOSIS — H401232 Low-tension glaucoma, bilateral, moderate stage: Secondary | ICD-10-CM | POA: Diagnosis not present

## 2019-10-02 DIAGNOSIS — H35373 Puckering of macula, bilateral: Secondary | ICD-10-CM | POA: Diagnosis not present

## 2019-10-02 DIAGNOSIS — H04123 Dry eye syndrome of bilateral lacrimal glands: Secondary | ICD-10-CM | POA: Diagnosis not present

## 2019-11-08 ENCOUNTER — Ambulatory Visit (INDEPENDENT_AMBULATORY_CARE_PROVIDER_SITE_OTHER): Payer: Medicare Other | Admitting: Internal Medicine

## 2019-11-08 ENCOUNTER — Encounter: Payer: Self-pay | Admitting: Internal Medicine

## 2019-11-08 ENCOUNTER — Other Ambulatory Visit: Payer: Self-pay

## 2019-11-08 VITALS — BP 128/84 | HR 46 | Temp 97.8°F | Ht 67.0 in

## 2019-11-08 DIAGNOSIS — R7302 Impaired glucose tolerance (oral): Secondary | ICD-10-CM

## 2019-11-08 DIAGNOSIS — E559 Vitamin D deficiency, unspecified: Secondary | ICD-10-CM | POA: Diagnosis not present

## 2019-11-08 DIAGNOSIS — D510 Vitamin B12 deficiency anemia due to intrinsic factor deficiency: Secondary | ICD-10-CM | POA: Diagnosis not present

## 2019-11-08 DIAGNOSIS — I10 Essential (primary) hypertension: Secondary | ICD-10-CM | POA: Diagnosis not present

## 2019-11-08 DIAGNOSIS — E782 Mixed hyperlipidemia: Secondary | ICD-10-CM | POA: Diagnosis not present

## 2019-11-08 LAB — COMPLETE METABOLIC PANEL WITH GFR
AG Ratio: 1.3 (calc) (ref 1.0–2.5)
ALT: 15 U/L (ref 6–29)
AST: 16 U/L (ref 10–35)
Albumin: 3.8 g/dL (ref 3.6–5.1)
Alkaline phosphatase (APISO): 76 U/L (ref 37–153)
BUN: 10 mg/dL (ref 7–25)
CO2: 26 mmol/L (ref 20–32)
Calcium: 9.2 mg/dL (ref 8.6–10.4)
Chloride: 108 mmol/L (ref 98–110)
Creat: 0.84 mg/dL (ref 0.60–0.88)
GFR, Est African American: 73 mL/min/{1.73_m2} (ref >=60)
GFR, Est Non African American: 63 mL/min/{1.73_m2} (ref >=60)
Globulin: 2.9 g/dL (calc) (ref 1.9–3.7)
Glucose, Bld: 108 mg/dL — ABNORMAL HIGH (ref 65–99)
Potassium: 4.7 mmol/L (ref 3.5–5.3)
Sodium: 142 mmol/L (ref 135–146)
Total Bilirubin: 0.9 mg/dL (ref 0.2–1.2)
Total Protein: 6.7 g/dL (ref 6.1–8.1)

## 2019-11-08 NOTE — Progress Notes (Addendum)
Subjective:    Patient ID: Deanna Wall, female    DOB: [birth year], 86 y.o.   MRN: [id number]  HPI   Here to f/u; overall doing ok,  Pt denies chest pain, increasing sob or doe, wheezing, orthopnea, PND, increased LE swelling, palpitations, dizziness or syncope.  Pt denies new neurological symptoms such as new headache, or facial or extremity weakness or numbness.  Pt denies polydipsia, polyuria, or low sugar episode.  Pt states overall good compliance with meds, mostly trying to follow appropriate diet, with wt overall stable,  but little exercise however.  Has been statin intolerant in past.  No new complaints  Past Medical History:  Diagnosis Date  . ANEMIA, PERNICIOUS 01/30/2009  . Cataracts, bilateral   . DEGENERATIVE DISC DISEASE 10/28/2006  . ECZEMA 10/28/2006  . Gait disorder   . Glaucoma   . HYPERLIPIDEMIA 02/16/2010  . HYPERTENSION 10/28/2006  . Impaired glucose tolerance 12/31/2010  . MS (multiple sclerosis) (HCC)   . Multiple sclerosis (HCC) 10/28/2006  . Obesity   . OSTEOARTHRITIS, KNEES, BILATERAL, SEVERE 06/14/2009  . Sebaceous cyst    chest   . VITAMIN B12 DEFICIENCY 06/14/2009   Past Surgical History:  Procedure Laterality Date  . COLONOSCOPY    . TUBAL LIGATION  1958  . UPPER GASTROINTESTINAL ENDOSCOPY      reports that she has never smoked. She has never used smokeless tobacco. She reports that she does not drink alcohol and does not use drugs. family history includes Arthritis in her mother and sister; Bone cancer in her mother; Cancer in her mother; Diabetes in her sister. Allergies  Allergen Reactions  . Atorvastatin     REACTION: feels funny in face and head   Current Outpatient Medications on File Prior to Visit  Medication Sig Dispense Refill  . aspirin 81 MG tablet Take 81 mg by mouth daily.      . citalopram (CELEXA) 10 MG tablet Take 1 tablet by mouth once daily 90 tablet 0  . cloNIDine (CATAPRES) 0.3 MG tablet Take 1 tablet by mouth twice daily 180  tablet 3  . diclofenac sodium (VOLTAREN) 1 % GEL Apply 2 g topically 4 (four) times daily as needed. 200 g 5  . ezetimibe (ZETIA) 10 MG tablet Take 1 tablet (10 mg total) by mouth daily. Annual appt is due in  must see provider for future refills 30 tablet 0  . furosemide (LASIX) 20 MG tablet TAKE 1 TABLET BY MOUTH ONCE DAILY AS NEEDED FOR  SWELLING 90 tablet 1  . latanoprost (XALATAN) 0.005 % ophthalmic solution INSTILL 1 DROP IN BOTH EYES AT BEDTIME. KEEP THE BOTTLE IN THE FRIDGE UNTIL YOU OPEN IT.  3  . nitrofurantoin (MACRODANTIN) 50 MG capsule Take 1 capsule by mouth once daily 30 capsule 5  . polyethylene glycol powder (GLYCOLAX/MIRALAX) 17 GM/SCOOP powder Take 17 g by mouth 2 (two) times daily as needed. 3350 g 1  . potassium chloride (KLOR-CON) 10 MEQ tablet Take 1 tablet by mouth once daily 90 tablet 1  . SIMBRINZA 1-0.2 % SUSP   4  . vitamin B-12 (CYANOCOBALAMIN) 500 MCG tablet Take 500 mcg by mouth daily.    . Vitamin D, Ergocalciferol, (DRISDOL) 1.25 MG (50000 UNIT) CAPS capsule Take 1 capsule (50,000 Units total) by mouth every 7 (seven) days. 12 capsule 0   No current facility-administered medications on file prior to visit.   Review of Systems All otherwise neg per pt    Objective:  Physical Exam BP 128/84   Pulse (!) 46   Temp 97.8 F (36.6 C) (Oral)   Ht 5\' 7"  (1.702 m)   SpO2 96%   BMI 39.16 kg/m  VS noted,  Constitutional: Pt appears in NAD HENT: Head: NCAT.  Right Ear: External ear normal.  Left Ear: External ear normal.  Eyes: . Pupils are equal, round, and reactive to light. Conjunctivae and EOM are normal Nose: without d/c or deformity Neck: Neck supple. Gross normal ROM Cardiovascular: Normal rate and regular rhythm.   Pulmonary/Chest: Effort normal and breath sounds without rales or wheezing.  Abd:  Soft, NT, ND, + BS, no organomegaly Neurological: Pt is alert. At baseline orientation, motor grossly intact Skin: Skin is warm. No rashes, other new  lesions, no LE edema Psychiatric: Pt behavior is normal without agitation  All otherwise neg per pt Lab Results  Component Value Date   WBC 6.2 11/08/2019   HGB 10.8 (L) 11/08/2019   HCT 34.3 (L) 11/08/2019   PLT 326 11/08/2019   GLUCOSE 108 (H) 11/08/2019   CHOL 173 11/08/2019   TRIG 89 11/08/2019   HDL 51 11/08/2019   LDLDIRECT 180.0 01/29/2010   LDLCALC 104 (H) 11/08/2019   ALT 15 11/08/2019   AST 16 11/08/2019   NA 142 11/08/2019   K 4.7 11/08/2019   CL 108 11/08/2019   CREATININE 0.84 11/08/2019   BUN 10 11/08/2019   CO2 26 11/08/2019   TSH 3.42 05/10/2019   HGBA1C 6.3 (H) 11/08/2019        Assessment & Plan:

## 2019-11-08 NOTE — Patient Instructions (Addendum)
Please take OTC Vitamin D3 at 2000 units per day, indefinitely  Please continue all other medications as before, and refills have been done if requested.  Please have the pharmacy call with any other refills you may need.  Please continue your efforts at being more active, low cholesterol diet, and weight control.  Please keep your appointments with your specialists as you may have planned  Please go to the LAB at the blood drawing area for the tests to be done  You will be contacted by phone if any changes need to be made immediately.  Otherwise, you will receive a letter about your results with an explanation, but please check with MyChart first.  Please remember to sign up for MyChart if you have not done so, as this will be important to you in the future with finding out test results, communicating by private email, and scheduling acute appointments online when needed.  Please make an Appointment to return in 6 months, or sooner if needed 

## 2019-11-09 ENCOUNTER — Other Ambulatory Visit: Payer: Self-pay | Admitting: Internal Medicine

## 2019-11-09 ENCOUNTER — Encounter: Payer: Self-pay | Admitting: Internal Medicine

## 2019-11-09 LAB — CBC WITH DIFFERENTIAL/PLATELET
Absolute Monocytes: 428 cells/uL (ref 200–950)
Basophils Absolute: 31 cells/uL (ref 0–200)
Basophils Relative: 0.5 %
Eosinophils Absolute: 68 cells/uL (ref 15–500)
Eosinophils Relative: 1.1 %
HCT: 34.3 % — ABNORMAL LOW (ref 35.0–45.0)
Hemoglobin: 10.8 g/dL — ABNORMAL LOW (ref 11.7–15.5)
Lymphs Abs: 1941 cells/uL (ref 850–3900)
MCH: 27.3 pg (ref 27.0–33.0)
MCHC: 31.5 g/dL — ABNORMAL LOW (ref 32.0–36.0)
MCV: 86.8 fL (ref 80.0–100.0)
MPV: 10.6 fL (ref 7.5–12.5)
Monocytes Relative: 6.9 %
Neutro Abs: 3732 cells/uL (ref 1500–7800)
Neutrophils Relative %: 60.2 %
Platelets: 326 10*3/uL (ref 140–400)
RBC: 3.95 10*6/uL (ref 3.80–5.10)
RDW: 13.6 % (ref 11.0–15.0)
Total Lymphocyte: 31.3 %
WBC: 6.2 10*3/uL (ref 3.8–10.8)

## 2019-11-09 LAB — LIPID PANEL
Cholesterol: 173 mg/dL (ref <200)
HDL: 51 mg/dL (ref >=50)
LDL Cholesterol (Calc): 104 mg/dL (calc) — ABNORMAL HIGH
Non-HDL Cholesterol (Calc): 122 mg/dL (calc) (ref <130)
Total CHOL/HDL Ratio: 3.4 (calc) (ref <5.0)
Triglycerides: 89 mg/dL (ref <150)

## 2019-11-09 LAB — HEMOGLOBIN A1C
Hgb A1c MFr Bld: 6.3 % of total Hgb — ABNORMAL HIGH (ref <5.7)
Mean Plasma Glucose: 134 (calc)
eAG (mmol/L): 7.4 (calc)

## 2019-11-09 LAB — VITAMIN D 25 HYDROXY (VIT D DEFICIENCY, FRACTURES): Vit D, 25-Hydroxy: 57 ng/mL (ref 30–100)

## 2019-11-09 LAB — VITAMIN B12: Vitamin B-12: 1089 pg/mL (ref 200–1100)

## 2019-11-09 NOTE — Telephone Encounter (Signed)
Please refill as per office routine med refill policy (all routine meds refilled for 3 mo or monthly per pt preference up to one year from last visit, then month to month grace period for 3 mo, then further med refills will have to be denied)  

## 2019-11-10 DIAGNOSIS — H401232 Low-tension glaucoma, bilateral, moderate stage: Secondary | ICD-10-CM | POA: Diagnosis not present

## 2019-11-10 DIAGNOSIS — H35033 Hypertensive retinopathy, bilateral: Secondary | ICD-10-CM | POA: Diagnosis not present

## 2019-11-10 DIAGNOSIS — H35373 Puckering of macula, bilateral: Secondary | ICD-10-CM | POA: Diagnosis not present

## 2019-11-10 DIAGNOSIS — H04123 Dry eye syndrome of bilateral lacrimal glands: Secondary | ICD-10-CM | POA: Diagnosis not present

## 2019-11-11 ENCOUNTER — Encounter: Payer: Self-pay | Admitting: Internal Medicine

## 2019-11-11 NOTE — Assessment & Plan Note (Signed)
stable overall by history and exam, recent data reviewed with pt, and pt to continue medical treatment as before,  to f/u any worsening symptoms or concerns  

## 2019-11-11 NOTE — Assessment & Plan Note (Signed)
For f/u lab 

## 2019-11-11 NOTE — Assessment & Plan Note (Signed)
For cont oral replacement 

## 2019-11-11 NOTE — Assessment & Plan Note (Addendum)
stable overall by history and exam, recent data reviewed with pt, and pt to continue medical treatment as before,  to f/u any worsening symptoms or concerns  I spent 31 minutes in preparing to see the patient by review of recent labs, imaging and procedures, obtaining and reviewing separately obtained history, communicating with the patient and family or caregiver, ordering medications, tests or procedures, and documenting clinical information in the EHR including the differential Dx, treatment, and any further evaluation and other management of vit d def, anemia, hyperglycemia, hld, htn

## 2019-12-08 ENCOUNTER — Other Ambulatory Visit: Payer: Self-pay | Admitting: Internal Medicine

## 2019-12-08 NOTE — Telephone Encounter (Signed)
Please refill as per office routine med refill policy (all routine meds refilled for 3 mo or monthly per pt preference up to one year from last visit, then month to month grace period for 3 mo, then further med refills will have to be denied)  

## 2020-01-04 ENCOUNTER — Other Ambulatory Visit: Payer: Self-pay | Admitting: Internal Medicine

## 2020-01-16 ENCOUNTER — Ambulatory Visit: Payer: Medicare Other | Admitting: Podiatry

## 2020-02-06 DIAGNOSIS — H401232 Low-tension glaucoma, bilateral, moderate stage: Secondary | ICD-10-CM | POA: Diagnosis not present

## 2020-02-06 DIAGNOSIS — H35373 Puckering of macula, bilateral: Secondary | ICD-10-CM | POA: Diagnosis not present

## 2020-02-06 DIAGNOSIS — H35033 Hypertensive retinopathy, bilateral: Secondary | ICD-10-CM | POA: Diagnosis not present

## 2020-02-06 DIAGNOSIS — H04123 Dry eye syndrome of bilateral lacrimal glands: Secondary | ICD-10-CM | POA: Diagnosis not present

## 2020-02-13 ENCOUNTER — Other Ambulatory Visit: Payer: Self-pay | Admitting: Internal Medicine

## 2020-02-13 NOTE — Telephone Encounter (Signed)
Please refill as per office routine med refill policy (all routine meds refilled for 3 mo or monthly per pt preference up to one year from last visit, then month to month grace period for 3 mo, then further med refills will have to be denied)  

## 2020-03-09 ENCOUNTER — Other Ambulatory Visit: Payer: Self-pay | Admitting: Internal Medicine

## 2020-03-09 NOTE — Telephone Encounter (Signed)
Please refill as per office routine med refill policy (all routine meds refilled for 3 mo or monthly per pt preference up to one year from last visit, then month to month grace period for 3 mo, then further med refills will have to be denied)  

## 2020-04-04 DIAGNOSIS — Z1152 Encounter for screening for COVID-19: Secondary | ICD-10-CM | POA: Diagnosis not present

## 2020-05-12 ENCOUNTER — Other Ambulatory Visit: Payer: Self-pay

## 2020-05-12 ENCOUNTER — Inpatient Hospital Stay
Admission: EM | Admit: 2020-05-12 | Discharge: 2020-05-16 | DRG: 481 | Disposition: A | Discharge status: Skilled Nursing Facility | Payer: Medicare Other | Attending: Internal Medicine | Admitting: Internal Medicine

## 2020-05-12 ENCOUNTER — Emergency Department: Payer: Medicare Other

## 2020-05-12 ENCOUNTER — Encounter: Payer: Self-pay | Admitting: Emergency Medicine

## 2020-05-12 DIAGNOSIS — D62 Acute posthemorrhagic anemia: Secondary | ICD-10-CM | POA: Diagnosis not present

## 2020-05-12 DIAGNOSIS — Z043 Encounter for examination and observation following other accident: Secondary | ICD-10-CM | POA: Diagnosis not present

## 2020-05-12 DIAGNOSIS — E669 Obesity, unspecified: Secondary | ICD-10-CM | POA: Diagnosis not present

## 2020-05-12 DIAGNOSIS — Z7982 Long term (current) use of aspirin: Secondary | ICD-10-CM

## 2020-05-12 DIAGNOSIS — R4182 Altered mental status, unspecified: Secondary | ICD-10-CM | POA: Diagnosis not present

## 2020-05-12 DIAGNOSIS — E785 Hyperlipidemia, unspecified: Secondary | ICD-10-CM | POA: Diagnosis not present

## 2020-05-12 DIAGNOSIS — Z888 Allergy status to other drugs, medicaments and biological substances status: Secondary | ICD-10-CM

## 2020-05-12 DIAGNOSIS — I370 Nonrheumatic pulmonary valve stenosis: Secondary | ICD-10-CM | POA: Diagnosis not present

## 2020-05-12 DIAGNOSIS — G35 Multiple sclerosis: Secondary | ICD-10-CM | POA: Diagnosis not present

## 2020-05-12 DIAGNOSIS — Z8261 Family history of arthritis: Secondary | ICD-10-CM | POA: Diagnosis not present

## 2020-05-12 DIAGNOSIS — Z20822 Contact with and (suspected) exposure to covid-19: Secondary | ICD-10-CM | POA: Diagnosis not present

## 2020-05-12 DIAGNOSIS — M5136 Other intervertebral disc degeneration, lumbar region: Secondary | ICD-10-CM | POA: Diagnosis not present

## 2020-05-12 DIAGNOSIS — S7291XA Unspecified fracture of right femur, initial encounter for closed fracture: Secondary | ICD-10-CM

## 2020-05-12 DIAGNOSIS — D510 Vitamin B12 deficiency anemia due to intrinsic factor deficiency: Secondary | ICD-10-CM | POA: Diagnosis not present

## 2020-05-12 DIAGNOSIS — Z6831 Body mass index (BMI) 31.0-31.9, adult: Secondary | ICD-10-CM | POA: Diagnosis not present

## 2020-05-12 DIAGNOSIS — S72401A Unspecified fracture of lower end of right femur, initial encounter for closed fracture: Secondary | ICD-10-CM | POA: Diagnosis not present

## 2020-05-12 DIAGNOSIS — I10 Essential (primary) hypertension: Secondary | ICD-10-CM | POA: Diagnosis not present

## 2020-05-12 DIAGNOSIS — E559 Vitamin D deficiency, unspecified: Secondary | ICD-10-CM | POA: Diagnosis not present

## 2020-05-12 DIAGNOSIS — M6281 Muscle weakness (generalized): Secondary | ICD-10-CM | POA: Diagnosis not present

## 2020-05-12 DIAGNOSIS — Z79899 Other long term (current) drug therapy: Secondary | ICD-10-CM | POA: Diagnosis not present

## 2020-05-12 DIAGNOSIS — S72301A Unspecified fracture of shaft of right femur, initial encounter for closed fracture: Principal | ICD-10-CM | POA: Diagnosis present

## 2020-05-12 DIAGNOSIS — H409 Unspecified glaucoma: Secondary | ICD-10-CM | POA: Diagnosis not present

## 2020-05-12 DIAGNOSIS — M170 Bilateral primary osteoarthritis of knee: Secondary | ICD-10-CM | POA: Diagnosis not present

## 2020-05-12 DIAGNOSIS — R5381 Other malaise: Secondary | ICD-10-CM | POA: Diagnosis not present

## 2020-05-12 DIAGNOSIS — Z4789 Encounter for other orthopedic aftercare: Secondary | ICD-10-CM | POA: Diagnosis not present

## 2020-05-12 DIAGNOSIS — F32A Depression, unspecified: Secondary | ICD-10-CM | POA: Diagnosis not present

## 2020-05-12 DIAGNOSIS — W19XXXA Unspecified fall, initial encounter: Secondary | ICD-10-CM

## 2020-05-12 DIAGNOSIS — E538 Deficiency of other specified B group vitamins: Secondary | ICD-10-CM | POA: Diagnosis present

## 2020-05-12 DIAGNOSIS — R279 Unspecified lack of coordination: Secondary | ICD-10-CM | POA: Diagnosis not present

## 2020-05-12 DIAGNOSIS — W010XXA Fall on same level from slipping, tripping and stumbling without subsequent striking against object, initial encounter: Secondary | ICD-10-CM | POA: Diagnosis present

## 2020-05-12 DIAGNOSIS — Z01818 Encounter for other preprocedural examination: Secondary | ICD-10-CM | POA: Diagnosis not present

## 2020-05-12 DIAGNOSIS — M1990 Unspecified osteoarthritis, unspecified site: Secondary | ICD-10-CM | POA: Diagnosis not present

## 2020-05-12 DIAGNOSIS — Z01810 Encounter for preprocedural cardiovascular examination: Secondary | ICD-10-CM | POA: Diagnosis not present

## 2020-05-12 DIAGNOSIS — S72401D Unspecified fracture of lower end of right femur, subsequent encounter for closed fracture with routine healing: Secondary | ICD-10-CM | POA: Diagnosis not present

## 2020-05-12 DIAGNOSIS — S72451D Displaced supracondylar fracture without intracondylar extension of lower end of right femur, subsequent encounter for closed fracture with routine healing: Secondary | ICD-10-CM | POA: Diagnosis not present

## 2020-05-12 DIAGNOSIS — S72351A Displaced comminuted fracture of shaft of right femur, initial encounter for closed fracture: Secondary | ICD-10-CM | POA: Diagnosis not present

## 2020-05-12 DIAGNOSIS — S72491A Other fracture of lower end of right femur, initial encounter for closed fracture: Secondary | ICD-10-CM | POA: Diagnosis not present

## 2020-05-12 DIAGNOSIS — Z419 Encounter for procedure for purposes other than remedying health state, unspecified: Secondary | ICD-10-CM

## 2020-05-12 LAB — CBC WITH DIFFERENTIAL/PLATELET
Abs Immature Granulocytes: 0.08 10*3/uL — ABNORMAL HIGH (ref 0.00–0.07)
Basophils Absolute: 0.1 10*3/uL (ref 0.0–0.1)
Basophils Relative: 0 %
Eosinophils Absolute: 0 10*3/uL (ref 0.0–0.5)
Eosinophils Relative: 0 %
HCT: 33.8 % — ABNORMAL LOW (ref 36.0–46.0)
Hemoglobin: 11 g/dL — ABNORMAL LOW (ref 12.0–15.0)
Immature Granulocytes: 0 %
Lymphocytes Relative: 13 %
Lymphs Abs: 2.5 10*3/uL (ref 0.7–4.0)
MCH: 28.3 pg (ref 26.0–34.0)
MCHC: 32.5 g/dL (ref 30.0–36.0)
MCV: 86.9 fL (ref 80.0–100.0)
Monocytes Absolute: 1.1 10*3/uL — ABNORMAL HIGH (ref 0.1–1.0)
Monocytes Relative: 6 %
Neutro Abs: 15.2 10*3/uL — ABNORMAL HIGH (ref 1.7–7.7)
Neutrophils Relative %: 81 %
Platelets: 360 10*3/uL (ref 150–400)
RBC: 3.89 MIL/uL (ref 3.87–5.11)
RDW: 15.1 % (ref 11.5–15.5)
WBC: 19 10*3/uL — ABNORMAL HIGH (ref 4.0–10.5)
nRBC: 0 % (ref 0.0–0.2)

## 2020-05-12 LAB — COMPREHENSIVE METABOLIC PANEL
ALT: 16 U/L (ref 0–44)
AST: 20 U/L (ref 15–41)
Albumin: 3.7 g/dL (ref 3.5–5.0)
Alkaline Phosphatase: 85 U/L (ref 38–126)
Anion gap: 9 (ref 5–15)
BUN: 13 mg/dL (ref 8–23)
CO2: 24 mmol/L (ref 22–32)
Calcium: 8.8 mg/dL — ABNORMAL LOW (ref 8.9–10.3)
Chloride: 109 mmol/L (ref 98–111)
Creatinine, Ser: 0.86 mg/dL (ref 0.44–1.00)
GFR, Estimated: >60 mL/min (ref >60)
Glucose, Bld: 139 mg/dL — ABNORMAL HIGH (ref 70–99)
Potassium: 4 mmol/L (ref 3.5–5.1)
Sodium: 142 mmol/L (ref 135–145)
Total Bilirubin: 1 mg/dL (ref 0.3–1.2)
Total Protein: 7.6 g/dL (ref 6.5–8.1)

## 2020-05-12 LAB — RESP PANEL BY RT-PCR (FLU A&B, COVID) ARPGX2
Influenza A by PCR: NEGATIVE
Influenza B by PCR: NEGATIVE
SARS Coronavirus 2 by RT PCR: NEGATIVE

## 2020-05-12 MED ORDER — EZETIMIBE 10 MG PO TABS
10.0000 mg | ORAL_TABLET | Freq: Every day | ORAL | Status: DC
  Administered 2020-05-14 – 2020-05-16 (×3): 10 mg via ORAL
  Filled 2020-05-12 (×4): qty 1

## 2020-05-12 MED ORDER — MAGNESIUM CITRATE PO SOLN
1.0000 | Freq: Once | ORAL | Status: DC | PRN
  Filled 2020-05-12: qty 296

## 2020-05-12 MED ORDER — BISACODYL 5 MG PO TBEC: 5.0000 mg | DELAYED_RELEASE_TABLET | Freq: Every day | ORAL | Status: DC | PRN

## 2020-05-12 MED ORDER — HYDROCODONE-ACETAMINOPHEN 5-325 MG PO TABS
1.0000 | ORAL_TABLET | ORAL | Status: DC | PRN
Start: 2020-05-12 — End: 2020-05-13

## 2020-05-12 MED ORDER — POTASSIUM CHLORIDE CRYS ER 10 MEQ PO TBCR: 10.0000 meq | EXTENDED_RELEASE_TABLET | Freq: Every day | ORAL | Status: DC

## 2020-05-12 MED ORDER — IPRATROPIUM BROMIDE 0.02 % IN SOLN: 0.5000 mg | Freq: Four times a day (QID) | RESPIRATORY_TRACT | Status: DC | PRN

## 2020-05-12 MED ORDER — VITAMIN B-12 1000 MCG PO TABS
500.0000 ug | ORAL_TABLET | Freq: Every day | ORAL | Status: DC
  Administered 2020-05-14 – 2020-05-16 (×3): 500 ug via ORAL
  Filled 2020-05-12 (×3): qty 1

## 2020-05-12 MED ORDER — ONDANSETRON HCL 4 MG/2ML IJ SOLN: 4.0000 mg | Freq: Four times a day (QID) | INTRAMUSCULAR | Status: DC | PRN

## 2020-05-12 MED ORDER — ONDANSETRON HCL 4 MG/2ML IJ SOLN
4.0000 mg | Freq: Once | INTRAMUSCULAR | Status: AC
  Administered 2020-05-12: 4 mg via INTRAVENOUS
  Filled 2020-05-12: qty 2

## 2020-05-12 MED ORDER — ACETAMINOPHEN 325 MG PO TABS: 650.0000 mg | ORAL_TABLET | Freq: Four times a day (QID) | ORAL | Status: DC | PRN

## 2020-05-12 MED ORDER — HEPARIN SODIUM (PORCINE) 5000 UNIT/ML IJ SOLN: 5000.0000 [IU] | Freq: Three times a day (TID) | INTRAMUSCULAR | Status: DC

## 2020-05-12 MED ORDER — CITALOPRAM HYDROBROMIDE 10 MG PO TABS
10.0000 mg | ORAL_TABLET | Freq: Every day | ORAL | Status: DC
  Administered 2020-05-14 – 2020-05-16 (×3): 10 mg via ORAL
  Filled 2020-05-12 (×4): qty 1

## 2020-05-12 MED ORDER — LATANOPROST 0.005 % OP SOLN
1.0000 [drp] | Freq: Every day | OPHTHALMIC | Status: DC
  Administered 2020-05-13 – 2020-05-15 (×3): 1 [drp] via OPHTHALMIC
  Filled 2020-05-12: qty 2.5

## 2020-05-12 MED ORDER — CEFAZOLIN SODIUM-DEXTROSE 2-4 GM/100ML-% IV SOLN
2.0000 g | INTRAVENOUS | Status: DC
  Filled 2020-05-12: qty 100

## 2020-05-12 MED ORDER — AMLODIPINE BESYLATE 5 MG PO TABS
5.0000 mg | ORAL_TABLET | Freq: Every day | ORAL | Status: DC
  Administered 2020-05-14 – 2020-05-16 (×3): 5 mg via ORAL
  Filled 2020-05-12 (×3): qty 1

## 2020-05-12 MED ORDER — OXYCODONE-ACETAMINOPHEN 5-325 MG PO TABS
1.0000 | ORAL_TABLET | Freq: Once | ORAL | Status: DC
Start: 2020-05-12 — End: 2020-05-13

## 2020-05-12 MED ORDER — TRAZODONE HCL 50 MG PO TABS: 25.0000 mg | ORAL_TABLET | Freq: Every evening | ORAL | Status: DC | PRN

## 2020-05-12 MED ORDER — CLONIDINE HCL 0.1 MG PO TABS
0.3000 mg | ORAL_TABLET | Freq: Two times a day (BID) | ORAL | Status: DC
  Administered 2020-05-13 – 2020-05-16 (×6): 0.3 mg via ORAL
  Filled 2020-05-12 (×6): qty 3

## 2020-05-12 MED ORDER — MORPHINE SULFATE (PF) 2 MG/ML IV SOLN
1.0000 mg | Freq: Four times a day (QID) | INTRAVENOUS | Status: DC | PRN
  Administered 2020-05-13: 1 mg via INTRAVENOUS
  Filled 2020-05-12: qty 1

## 2020-05-12 MED ORDER — SENNOSIDES-DOCUSATE SODIUM 8.6-50 MG PO TABS: 1.0000 | ORAL_TABLET | Freq: Every evening | ORAL | Status: DC | PRN

## 2020-05-12 MED ORDER — ACETAMINOPHEN 650 MG RE SUPP: 650.0000 mg | Freq: Four times a day (QID) | RECTAL | Status: DC | PRN

## 2020-05-12 MED ORDER — MORPHINE SULFATE (PF) 4 MG/ML IV SOLN
4.0000 mg | Freq: Once | INTRAVENOUS | Status: AC
  Administered 2020-05-12: 4 mg via INTRAVENOUS
  Filled 2020-05-12: qty 1

## 2020-05-12 MED ORDER — ALBUTEROL SULFATE (2.5 MG/3ML) 0.083% IN NEBU: 2.5000 mg | INHALATION_SOLUTION | Freq: Four times a day (QID) | RESPIRATORY_TRACT | Status: DC | PRN

## 2020-05-12 MED ORDER — ONDANSETRON HCL 4 MG PO TABS: 4.0000 mg | ORAL_TABLET | Freq: Four times a day (QID) | ORAL | Status: DC | PRN

## 2020-05-12 NOTE — Consult Note (Signed)
ORTHOPAEDIC CONSULTATION  REQUESTING PHYSICIAN: Chesley Noon, MD  Chief Complaint: Right leg pain status post fall  HPI: Deanna Wall is a 87 y.o. female with MS seen in the emergency department with her daughter.  Patient initially presented to [facility] in [place] after a fall at home.  Patient states that she has MS and that her legs gave out on her today causing her to fall.  She denied significant pain after the fall and attempted to ambulate.  Patient brought to [facility] when she was having difficulty ambulating.  X-rays demonstrated a right displaced distal femur fracture.  Patient was referred to [facility] for treatment of her fracture.  Past Medical History:  Diagnosis Date  . ANEMIA, PERNICIOUS 01/30/2009  . Cataracts, bilateral   . DEGENERATIVE DISC DISEASE 10/28/2006  . ECZEMA 10/28/2006  . Gait disorder   . Glaucoma   . HYPERLIPIDEMIA 02/16/2010  . HYPERTENSION 10/28/2006  . Impaired glucose tolerance 12/31/2010  . MS (multiple sclerosis) (HCC)   . Multiple sclerosis (HCC) 10/28/2006  . Obesity   . OSTEOARTHRITIS, KNEES, BILATERAL, SEVERE 06/14/2009  . Sebaceous cyst    chest   . VITAMIN B12 DEFICIENCY 06/14/2009   Past Surgical History:  Procedure Laterality Date  . COLONOSCOPY    . TUBAL LIGATION  1958  . UPPER GASTROINTESTINAL ENDOSCOPY     Social History   Socioeconomic History  . Marital status: Married    Spouse name: Not on file  . Number of children: 5  . Years of education: 11TH   . Highest education level: Not on file  Occupational History  . Occupation: homemaker    Employer: RETIRED  Tobacco Use  . Smoking status: Never Smoker  . Smokeless tobacco: Never Used  Substance and Sexual Activity  . Alcohol use: No  . Drug use: No  . Sexual activity: Not on file  Other Topics Concern  . Not on file  Social History Narrative  . Not on file   Social Determinants of Health   Financial Resource Strain: Not  on file  Food Insecurity: Not on file  Transportation Needs: Not on file  Physical Activity: Not on file  Stress: Not on file  Social Connections: Not on file   Family History  Problem Relation Age of Onset  . Bone cancer Mother   . Arthritis Mother   . Cancer Mother        bone  . Diabetes Sister   . Arthritis Sister    Allergies  Allergen Reactions  . Atorvastatin     REACTION: feels funny in face and head   Prior to Admission medications   Medication Sig Start Date End Date Taking? Authorizing Provider  potassium chloride (KLOR-CON) 10 MEQ tablet Take 1 tablet by mouth once daily 03/11/20   Corwin Levins, MD  amLODipine [facility]) 5 MG tablet Take 1 tablet by mouth once daily 02/14/20   Corwin Levins, MD  aspirin 81 MG tablet Take 81 mg by mouth daily.      [provider]  citalopram (CELEXA) 10 MG tablet Take 1 tablet by mouth once daily 01/04/20   Corwin Levins, MD  cloNIDine (CATAPRES) 0.3 MG tablet Take 1 tablet by mouth twice daily 06/26/19   Corwin Levins, MD  diclofenac sodium (VOLTAREN) 1 % GEL Apply 2 g topically 4 (four) times daily as needed. 11/02/18   Corwin Levins, MD  ezetimibe (ZETIA) 10 MG tablet Take 1 tablet (  10 mg total) by mouth daily. Annual appt is due in  must see provider for future refills 06/08/19   Corwin Levins, MD  furosemide (LASIX) 20 MG tablet TAKE 1 TABLET BY MOUTH ONCE DAILY AS NEEDED FOR  SWELLING 02/07/19   Corwin Levins, MD  latanoprost (XALATAN) 0.005 % ophthalmic solution INSTILL 1 DROP IN BOTH EYES AT BEDTIME. KEEP THE BOTTLE IN THE FRIDGE UNTIL YOU OPEN IT. 11/08/14   [provider]  nitrofurantoin (MACRODANTIN) 50 MG capsule Take 1 capsule by mouth once daily 06/26/19   Corwin Levins, MD  polyethylene glycol powder (GLYCOLAX/MIRALAX) 17 GM/SCOOP powder Take 17 g by mouth 2 (two) times daily as needed. 11/10/18   Corwin Levins, MD  SIMBRINZA 1-0.2 % SUSP  01/23/18   [provider]  vitamin B-12 (CYANOCOBALAMIN) 500  MCG tablet Take 500 mcg by mouth daily.    [provider]  Vitamin D, Ergocalciferol, (DRISDOL) 1.25 MG (50000 UNIT) CAPS capsule Take 1 capsule (50,000 Units total) by mouth every 7 (seven) days. 05/11/19   Corwin Levins, MD   DG Chest 1 View  Result Date: 05/12/2020 CLINICAL DATA:  Larey Seat today EXAM: CHEST  1 VIEW COMPARISON:  04/14/2011 FINDINGS: Normal heart size, mediastinal contours, and pulmonary vascularity. Linear scarring in the mid lungs bilaterally and at medial LEFT base. Lungs otherwise clear. No infiltrate, pleural effusion or pneumothorax. Atherosclerotic calcification aorta. IMPRESSION: BILATERAL lung scarring. No acute abnormalities. Aortic Atherosclerosis (ICD10-I70.0). Electronically Signed   By: Ulyses Southward M.D.   On: 05/12/2020 19:06   DG Knee 1-2 Views Right  Result Date: 05/12/2020 CLINICAL DATA:  Fall today EXAM: RIGHT KNEE - 1-2 VIEW COMPARISON:  None FINDINGS: Comminuted displaced oblique fracture of the distal RIGHT femoral metadiaphysis with mild apex posterior angulation and mild overriding. No dislocation. Degenerative changes of RIGHT knee greatest at lateral compartment. No joint effusion. IMPRESSION: Displaced and angulated distal RIGHT femoral metadiaphyseal fracture. Osseous demineralization with degenerative changes RIGHT knee. Electronically Signed   By: Ulyses Southward M.D.   On: 05/12/2020 19:08   DG Hip Unilat W or Wo Pelvis 2-3 Views Right  Result Date: 05/12/2020 CLINICAL DATA:  Fall today EXAM: DG HIP (WITH OR WITHOUT PELVIS) 2-3V RIGHT COMPARISON:  None FINDINGS: Osseous demineralization. Hip and SI joint spaces preserved. Iliopsoas tendon calcification LEFT hip. No acute fracture, dislocation, or bone destruction. Degenerative disc and facet disease changes at visualized lower lumbar spine. IMPRESSION: No acute osseous abnormalities involving the pelvis or hips. Electronically Signed   By: Ulyses Southward M.D.   On: 05/12/2020 19:07    Positive ROS: All  other systems have been reviewed and were otherwise negative with the exception of those mentioned in the HPI and as above.  Physical Exam: General: Alert, no acute distress   MUSCULOSKELETAL: Right lower extremity: Patient has a knee immobilizer in place.  Her skin is intact.  Her compartments are soft and compressible.  Distally she has palpable pedal pulses, intact sensation light touch and intact motor function.  She has shortening and external rotation of the right lower extremity compared to the left.  Assessment: Closed, displaced right distal femur fracture  Plan: I have reviewed the x-ray films of the patient's right femur.  She has a significantly displaced and shortened right femur fracture.  I explained to the patient that this fracture would require surgery to heal.  Without surgery she will not walk and would require long-term stabilization with a splint.  I would recommend surgical intervention with open reduction internal fixation.  Stabilizing the fracture with plate and screws would allow the fracture to heal.  The patient will be admitted to the medical service for preop clearance.  Patient will be n.p.o. after midnight.  She will be placed on the OR schedule for tomorrow pending medical clearance.  I reviewed the details of the operation as well as the postoperative course with the patient and her daughter.  I answered all her questions.   Juanell Fairly, MD    05/12/2020 8:50 PM

## 2020-05-12 NOTE — H&P (Signed)
History and Physical   [facility] [place] [facility] Ad[facility] History and Physical [employer] Jackelynn Hosie, D.O.    Patient Name: Deanna Wall MR#: [id number] Date of Birth: 11/26/1931 Date of Admission: 05/12/2020  Referring MD/NP/PA: Gala RomneyJonathan Cuthriell, PA Primary Care Physician: Corwin LevinsJohn, James W, MD  Chief Complaint:  Chief Complaint  Patient presents with   Leg Injury    HPI: Deanna Wall is a 87 y.o. female with a known history of HTN, HLD, MS, OA, pernicious anemia, glaucoma presents to the emergency department for evaluation of right femur fracture.  Patient was in a usual state of health until this afternoon when she sustained a mechanical fall at home, slipped and fell twisting her leg.  She was evaluated at [facility] where xrays revealed a displaced distal femur fracture.  She was ambulatory following the fall and has since denied pain.   Patient denies fevers/chills, weakness, dizziness, chest pain, shortness of breath, N/V/C/D, abdominal pain, dysuria/frequency, changes in mental status.    Otherwise there has been no change in status. Patient has been taking medication as prescribed and there has been no recent change in medication or diet.  No recent antibiotics.  There has been no recent illness, hospitalizations, travel or sick contacts.    Patient is functionally independent with ADL with the use of a walker - has generalized lower extremity weakness 2/2 MS but she does get around.  She does not have any chest pain or shortness of breath that limits her activity level.   EMS/ED Course: Patient received morphine, Percocet and Zofran. Medical admission has been requested for optimization preop.  Orthopedist Dr. Martha ClanKrasinski has consulted and plans to operate in AM.  Review of Systems:  CONSTITUTIONAL: No fever/chills, fatigue, weakness, weight gain/loss, headache. EYES: No blurry or double vision. ENT: No tinnitus, postnasal drip, redness or soreness of the  oropharynx. RESPIRATORY: No cough, dyspnea, wheeze.  No hemoptysis.  CARDIOVASCULAR: No chest pain, palpitations, syncope, orthopnea. No lower extremity edema.  GASTROINTESTINAL: No nausea, vomiting, abdominal pain, diarrhea, constipation.  No hematemesis, melena or hematochezia. GENITOURINARY: No dysuria, frequency, hematuria. ENDOCRINE: No polyuria or nocturia. No heat or cold intolerance. HEMATOLOGY: No anemia, bruising, bleeding. INTEGUMENTARY: No rashes, ulcers, lesions. MUSCULOSKELETAL: No arthritis, gout, dyspnea. NEUROLOGIC: No numbness, tingling, ataxia, seizure-type activity, weakness. PSYCHIATRIC: No anxiety, depression, insomnia.   Past Medical History:  Diagnosis Date   ANEMIA, PERNICIOUS 01/30/2009   Cataracts, bilateral    DEGENERATIVE DISC DISEASE 10/28/2006   ECZEMA 10/28/2006   Gait disorder    Glaucoma    HYPERLIPIDEMIA 02/16/2010   HYPERTENSION 10/28/2006   Impaired glucose tolerance 12/31/2010   MS (multiple sclerosis) (HCC)    Multiple sclerosis (HCC) 10/28/2006   Obesity    OSTEOARTHRITIS, KNEES, BILATERAL, SEVERE 06/14/2009   Sebaceous cyst    chest    VITAMIN B12 DEFICIENCY 06/14/2009    Past Surgical History:  Procedure Laterality Date   COLONOSCOPY     TUBAL LIGATION  1958   UPPER GASTROINTESTINAL ENDOSCOPY       reports that she has never smoked. She has never used smokeless tobacco. She reports that she does not drink alcohol and does not use drugs.  Allergies  Allergen Reactions   Atorvastatin     REACTION: feels funny in face and head    Family History  Problem Relation Age of Onset   Bone cancer Mother    Arthritis Mother    Cancer Mother        bone  Diabetes Sister    Arthritis Sister     Prior to Admission medications   Medication Sig Start Date End Date Taking? Authorizing Provider  potassium chloride (KLOR-CON) 10 MEQ tablet Take 1 tablet by mouth once daily 03/11/20   Corwin Levins, MD  amLODipine  [facility]) 5 MG tablet Take 1 tablet by mouth once daily 02/14/20   Corwin Levins, MD  aspirin 81 MG tablet Take 81 mg by mouth daily.      [provider]  citalopram (CELEXA) 10 MG tablet Take 1 tablet by mouth once daily 01/04/20   Corwin Levins, MD  cloNIDine (CATAPRES) 0.3 MG tablet Take 1 tablet by mouth twice daily 06/26/19   Corwin Levins, MD  diclofenac sodium (VOLTAREN) 1 % GEL Apply 2 g topically 4 (four) times daily as needed. 11/02/18   Corwin Levins, MD  ezetimibe (ZETIA) 10 MG tablet Take 1 tablet (10 mg total) by mouth daily. Annual appt is due in  must see provider for future refills 06/08/19   Corwin Levins, MD  furosemide (LASIX) 20 MG tablet TAKE 1 TABLET BY MOUTH ONCE DAILY AS NEEDED FOR  SWELLING 02/07/19   Corwin Levins, MD  latanoprost (XALATAN) 0.005 % ophthalmic solution INSTILL 1 DROP IN BOTH EYES AT BEDTIME. KEEP THE BOTTLE IN THE FRIDGE UNTIL YOU OPEN IT. 11/08/14   [provider]  nitrofurantoin (MACRODANTIN) 50 MG capsule Take 1 capsule by mouth once daily 06/26/19   Corwin Levins, MD  polyethylene glycol powder (GLYCOLAX/MIRALAX) 17 GM/SCOOP powder Take 17 g by mouth 2 (two) times daily as needed. 11/10/18   Corwin Levins, MD  SIMBRINZA 1-0.2 % SUSP  01/23/18   [provider]  vitamin B-12 (CYANOCOBALAMIN) 500 MCG tablet Take 500 mcg by mouth daily.    [provider]  Vitamin D, Ergocalciferol, (DRISDOL) 1.25 MG (50000 UNIT) CAPS capsule Take 1 capsule (50,000 Units total) by mouth every 7 (seven) days. 05/11/19   Corwin Levins, MD    Physical Exam: Vitals:   05/12/20 2100 05/12/20 2130 05/12/20 2200 05/12/20 2230  BP: (!) 154/69 (!) 195/72 (!) 153/64 (!) 151/60  Pulse: 70 76 70 70  Resp: 16 17 15 15   Temp:      TempSrc:      SpO2: 96% 98% 96% 96%  Weight:      Height:        GENERAL: 87 y.o.-year-old white female patient, well-developed, well-nourished lying in the bed in no acute distress.  Pleasant and cooperative.   HEENT:  Head atraumatic, normocephalic. Pupils equal. Mucus membranes moist. NECK: Supple. No JVD. CHEST: Normal breath sounds bilaterally. No wheezing, rales, rhonchi or crackles. No use of accessory muscles of respiration.  No reproducible chest wall tenderness.  CARDIOVASCULAR: S1, S2 normal. No murmurs, rubs, or gallops. Cap refill <2 seconds. Pulses intact distally.  ABDOMEN: Soft, nondistended, nontender. No rebound, guarding, rigidity. Normoactive bowel sounds present in all four quadrants.  EXTREMITIES: Right knee immobilizer in place. Right hip and thigh are nontender. R leg short and externally rotated.  No pedal edema, cyanosis, or clubbing. No calf tenderness or Homan's sign.  NEUROLOGIC: The patient is alert and oriented x 3. Cranial nerves II through XII are grossly intact with no focal sensorimotor deficit. PSYCHIATRIC:  Normal affect, mood, thought content. SKIN: Warm, dry, and intact without obvious rash, lesion, or ulcer.    Labs on Admission:  CBC: Recent Labs  Lab 05/12/20 1803  WBC 19.0*  NEUTROABS 15.2*  HGB 11.0*  HCT 33.8*  MCV 86.9  PLT 360   Basic Metabolic Panel: Recent Labs  Lab 05/12/20 1803  NA 142  K 4.0  CL 109  CO2 24  GLUCOSE 139*  BUN 13  CREATININE 0.86  CALCIUM 8.8*   GFR: Estimated Creatinine Clearance: 60.9 mL/min (by C-G formula based on SCr of 0.86 mg/dL). Liver Function Tests: Recent Labs  Lab 05/12/20 1803  AST 20  ALT 16  ALKPHOS 85  BILITOT 1.0  PROT 7.6  ALBUMIN 3.7   No results for input(s): LIPASE, AMYLASE in the last 168 hours. No results for input(s): AMMONIA in the last 168 hours. Coagulation Profile: No results for input(s): INR, PROTIME in the last 168 hours. Cardiac Enzymes: No results for input(s): CKTOTAL, CKMB, CKMBINDEX, TROPONINI in the last 168 hours. BNP (last 3 results) No results for input(s): PROBNP in the last 8760 hours. HbA1C: No results for input(s): HGBA1C in the last 72 hours. CBG: No results  for input(s): GLUCAP in the last 168 hours. Lipid Profile: No results for input(s): CHOL, HDL, LDLCALC, TRIG, CHOLHDL, LDLDIRECT in the last 72 hours. Thyroid Function Tests: No results for input(s): TSH, T4TOTAL, FREET4, T3FREE, THYROIDAB in the last 72 hours. Anemia Panel: No results for input(s): VITAMINB12, FOLATE, FERRITIN, TIBC, IRON, RETICCTPCT in the last 72 hours. Urine analysis:    Component Value Date/Time   COLORURINE YELLOW 05/10/2019 1041   APPEARANCEUR Sl Cloudy (A) 05/10/2019 1041   LABSPEC 1.020 05/10/2019 1041   PHURINE 6.5 05/10/2019 1041   GLUCOSEU NEGATIVE 05/10/2019 1041   HGBUR NEGATIVE 05/10/2019 1041   HGBUR negative 11/25/2006 0821   BILIRUBINUR NEGATIVE 05/10/2019 1041   BILIRUBINUR negative 12/26/2015 1412   KETONESUR NEGATIVE 05/10/2019 1041   PROTEINUR negative 12/26/2015 1412   UROBILINOGEN 0.2 05/10/2019 1041   NITRITE NEGATIVE 05/10/2019 1041   LEUKOCYTESUR LARGE (A) 05/10/2019 1041   Sepsis Labs: @LABRCNTIP (procalcitonin:4,lacticidven:4) ) Recent Results (from the past 240 hour(s))  Resp Panel by RT-PCR (Flu A&B, Covid) Nasopharyngeal Swab     Status: None   Collection Time: 05/12/20  6:05 PM   Specimen: Nasopharyngeal Swab; Nasopharyngeal(NP) swabs in vial transport medium  Result Value Ref Range Status   SARS Coronavirus 2 by RT PCR NEGATIVE NEGATIVE Final    Comment: (NOTE) SARS-CoV-2 target nucleic acids are NOT DETECTED.  The SARS-CoV-2 RNA is generally detectable in upper respiratory specimens during the acute phase of infection. The lowest concentration of SARS-CoV-2 viral copies this assay can detect is 138 copies/mL. A negative result does not preclude SARS-Cov-2 infection and should not be used as the sole basis for treatment or other patient management decisions. A negative result may occur with  improper specimen collection/handling, submission of specimen other than nasopharyngeal swab, presence of viral mutation(s) within  the areas targeted by this assay, and inadequate number of viral copies(<138 copies/mL). A negative result must be combined with clinical observations, patient history, and epidemiological information. The expected result is Negative.  Fact Sheet for Patients:  BloggerCourse.com  Fact Sheet for Healthcare Providers:  SeriousBroker.it  This test is no t yet approved or cleared by the [place] FDA and  has been authorized for detection and/or diagnosis of SARS-CoV-2 by FDA under an Emergency Use Authorization (EUA). This EUA will remain  in effect (meaning this test can be used) for the duration of the COVID-19 declaration under Section 564(b)(1) of the Act, 21 U.S.C.section 360bbb-3(b)(1), unless the authorization is terminated  or revoked sooner.       Influenza A by PCR NEGATIVE NEGATIVE Final   Influenza B by PCR NEGATIVE NEGATIVE Final    Comment: (NOTE) The Xpert Xpress SARS-CoV-2/FLU/RSV plus assay is intended as an aid in the diagnosis of influenza from Nasopharyngeal swab specimens and should not be used as a sole basis for treatment. Nasal washings and aspirates are unacceptable for Xpert Xpress SARS-CoV-2/FLU/RSV testing.  Fact Sheet for Patients: BloggerCourse.com  Fact Sheet for Healthcare Providers: SeriousBroker.it  This test is not yet approved or cleared by the [place] FDA and has been authorized for detection and/or diagnosis of SARS-CoV-2 by FDA under an Emergency Use Authorization (EUA). This EUA will remain in effect (meaning this test can be used) for the duration of the COVID-19 declaration under Section 564(b)(1) of the Act, 21 U.S.C. section 360bbb-3(b)(1), unless the authorization is terminated or revoked.  Performed at [facility], [address], [place], [place] [address]      Radiological Exams on Admission: DG Chest 1  View  Result Date: 05/12/2020 CLINICAL DATA:  Larey Seat today EXAM: CHEST  1 VIEW COMPARISON:  04/14/2011 FINDINGS: Normal heart size, mediastinal contours, and pulmonary vascularity. Linear scarring in the mid lungs bilaterally and at medial LEFT base. Lungs otherwise clear. No infiltrate, pleural effusion or pneumothorax. Atherosclerotic calcification aorta. IMPRESSION: BILATERAL lung scarring. No acute abnormalities. Aortic Atherosclerosis (ICD10-I70.0). Electronically Signed   By: Ulyses Southward M.D.   On: 05/12/2020 19:06   DG Knee 1-2 Views Right  Result Date: 05/12/2020 CLINICAL DATA:  Fall today EXAM: RIGHT KNEE - 1-2 VIEW COMPARISON:  None FINDINGS: Comminuted displaced oblique fracture of the distal RIGHT femoral metadiaphysis with mild apex posterior angulation and mild overriding. No dislocation. Degenerative changes of RIGHT knee greatest at lateral compartment. No joint effusion. IMPRESSION: Displaced and angulated distal RIGHT femoral metadiaphyseal fracture. Osseous demineralization with degenerative changes RIGHT knee. Electronically Signed   By: Ulyses Southward M.D.   On: 05/12/2020 19:08   DG Hip Unilat W or Wo Pelvis 2-3 Views Right  Result Date: 05/12/2020 CLINICAL DATA:  Fall today EXAM: DG HIP (WITH OR WITHOUT PELVIS) 2-3V RIGHT COMPARISON:  None FINDINGS: Osseous demineralization. Hip and SI joint spaces preserved. Iliopsoas tendon calcification LEFT hip. No acute fracture, dislocation, or bone destruction. Degenerative disc and facet disease changes at visualized lower lumbar spine. IMPRESSION: No acute osseous abnormalities involving the pelvis or hips. Electronically Signed   By: Ulyses Southward M.D.   On: 05/12/2020 19:07    EKG: Normal sinus rhythm at 64 bpm with normal axis, LVH and nonspecific ST-T wave changes.   Assessment/Plan  This is a 87 y.o. female with a history of HTN, HLD, MS, OA, pernicious anemia, glaucoma now being admitted with:  #. Displaced distal right femur  fracture - Admit inpatient - Check EKG, echo, INR - Hold aspirin tomorrow morning - Pain control - Given advanced age and medical history, would appreciate preop cardiology evaluation prior to surgery.  - Will need PT eval - Remainder of orders per ortho.   #. History of HTN - Continue Norvasc, Clonidine, Lasix  #. History of HLD - Continue Zetia  #. History of OA - Hold Voltaren  #. History of depression - Continue Celexa  #. History of glaucoma - Continue Xalatan   Admission status: IP IV Fluids: HL Diet/Nutrition: NPO after midnight Consults called: Ortho, cardiology  DVT Px: Heparin, SCDs and early ambulation. Code Status: Full Code  Disposition Plan:  To be determined  All the records are reviewed and case discussed with ED provider. Management plans discussed with the patient and daughter at bedside. They express understanding and agree with plan of care.  Tonye Royalty D.O. on 05/12/2020 at 10:55 PM CC: Primary care physician; Corwin Levins, MD   05/12/2020, 10:55 PM

## 2020-05-12 NOTE — ED Notes (Signed)
Report given to [facility]

## 2020-05-12 NOTE — ED Provider Notes (Signed)
[facility] Emergency Department Provider Note  ____________________________________________  Time seen: Approximately 5:55 PM  I have reviewed the triage vital signs and the nursing notes.   HISTORY  Chief Complaint Leg Injury    HPI Deanna Wall is a 87 y.o. female who presents the emergency department via EMS from [facility] urgent care for complaint of distal femur fracture.  According to the patient she had a mechanical fall where she slipped and fell twisting her leg earlier today.  Patient states that she was originally evaluated by EMS but as she did not have any pain she did not want to be transported.  EMS urged the patient's family to take her to the orthopedic urgent care which they did.  Initial x-rays revealed distal femur fracture with significant displacement.  Patient is denying any pain, swelling or ecchymosis to the right leg at this time.  She denies hitting her head or losing consciousness.  She has no hip or back pain.  Patient states that she ambulated on this extremity after the initial injury.  Again, she denies any pain complaints currently and is here due to identified distal femur fracture on imaging from urgent care         Past Medical History:  Diagnosis Date  . ANEMIA, PERNICIOUS 01/30/2009  . Cataracts, bilateral   . DEGENERATIVE DISC DISEASE 10/28/2006  . ECZEMA 10/28/2006  . Gait disorder   . Glaucoma   . HYPERLIPIDEMIA 02/16/2010  . HYPERTENSION 10/28/2006  . Impaired glucose tolerance 12/31/2010  . MS (multiple sclerosis) (HCC)   . Multiple sclerosis (HCC) 10/28/2006  . Obesity   . OSTEOARTHRITIS, KNEES, BILATERAL, SEVERE 06/14/2009  . Sebaceous cyst    chest   . VITAMIN B12 DEFICIENCY 06/14/2009    Patient Active Problem List   Diagnosis Date Noted  . Vitamin D deficiency 11/08/2019  . Arthritis of hand, degenerative 11/02/2018  . Anxiety 11/04/2017  . Bradycardia 10/21/2017  . Ingrown toenail 10/13/2017  . Cough  04/01/2017  . Nausea & vomiting 10/10/2016  . Vertigo 10/10/2016  . Constipation 05/13/2016  . Irregular heart beat 11/13/2015  . Insect bite, infected 09/11/2015  . Cerumen impaction 09/11/2014  . Allergic rhinitis 06/05/2014  . Nausea without vomiting 06/05/2014  . Paresthesias 11/08/2012  . Left facial pain 12/07/2011  . Recurrent UTI 11/24/2011  . Dysuria 05/25/2011  . Impaired glucose tolerance 12/31/2010  . Preventative health care 07/01/2010  . Hyperlipidemia 02/16/2010  . VITAMIN B12 DEFICIENCY 06/14/2009  . Iron deficiency anemia 06/14/2009  . OSTEOARTHRITIS, KNEES, BILATERAL, SEVERE 06/14/2009  . LUMBAR RADICULOPATHY, RIGHT 06/14/2009  . Abnormality of gait 06/14/2009  . ANEMIA, PERNICIOUS 01/30/2009  . Multiple sclerosis (HCC) 10/28/2006  . Essential hypertension 10/28/2006  . ECZEMA 10/28/2006  . DEGENERATIVE DISC DISEASE 10/28/2006    Past Surgical History:  Procedure Laterality Date  . COLONOSCOPY    . TUBAL LIGATION  1958  . UPPER GASTROINTESTINAL ENDOSCOPY      Prior to Admission medications   Medication Sig Start Date End Date Taking? Authorizing Provider  potassium chloride (KLOR-CON) 10 MEQ tablet Take 1 tablet by mouth once daily 03/11/20   Corwin Levins, MD  amLODipine [facility]) 5 MG tablet Take 1 tablet by mouth once daily 02/14/20   Corwin Levins, MD  aspirin 81 MG tablet Take 81 mg by mouth daily.      [provider]  citalopram (CELEXA) 10 MG tablet Take 1 tablet by mouth once daily 01/04/20  Corwin Levins, MD  cloNIDine (CATAPRES) 0.3 MG tablet Take 1 tablet by mouth twice daily 06/26/19   Corwin Levins, MD  diclofenac sodium (VOLTAREN) 1 % GEL Apply 2 g topically 4 (four) times daily as needed. 11/02/18   Corwin Levins, MD  ezetimibe (ZETIA) 10 MG tablet Take 1 tablet (10 mg total) by mouth daily. Annual appt is due in  must see provider for future refills 06/08/19   Corwin Levins, MD  furosemide (LASIX) 20 MG tablet TAKE 1 TABLET BY MOUTH  ONCE DAILY AS NEEDED FOR  SWELLING 02/07/19   Corwin Levins, MD  latanoprost (XALATAN) 0.005 % ophthalmic solution INSTILL 1 DROP IN BOTH EYES AT BEDTIME. KEEP THE BOTTLE IN THE FRIDGE UNTIL YOU OPEN IT. 11/08/14   [provider]  nitrofurantoin (MACRODANTIN) 50 MG capsule Take 1 capsule by mouth once daily 06/26/19   Corwin Levins, MD  polyethylene glycol powder (GLYCOLAX/MIRALAX) 17 GM/SCOOP powder Take 17 g by mouth 2 (two) times daily as needed. 11/10/18   Corwin Levins, MD  SIMBRINZA 1-0.2 % SUSP  01/23/18   [provider]  vitamin B-12 (CYANOCOBALAMIN) 500 MCG tablet Take 500 mcg by mouth daily.    [provider]  Vitamin D, Ergocalciferol, (DRISDOL) 1.25 MG (50000 UNIT) CAPS capsule Take 1 capsule (50,000 Units total) by mouth every 7 (seven) days. 05/11/19   Corwin Levins, MD    Allergies Atorvastatin  Family History  Problem Relation Age of Onset  . Bone cancer Mother   . Arthritis Mother   . Cancer Mother        bone  . Diabetes Sister   . Arthritis Sister     Social History Social History   Tobacco Use  . Smoking status: Never Smoker  . Smokeless tobacco: Never Used  Substance Use Topics  . Alcohol use: No  . Drug use: No     Review of Systems  Constitutional: No fever/chills Eyes: No visual changes. No discharge ENT: No upper respiratory complaints. Cardiovascular: no chest pain. Respiratory: no cough. No SOB. Gastrointestinal: No abdominal pain.  No nausea, no vomiting.  No diarrhea.  No constipation. Musculoskeletal: Right lower extremity injury secondary to mechanical fall Skin: Negative for rash, abrasions, lacerations, ecchymosis. Neurological: Negative for headaches, focal weakness or numbness.  10 System ROS otherwise negative.  ____________________________________________   PHYSICAL EXAM:  VITAL SIGNS: ED Triage Vitals  Enc Vitals Group     BP      Pulse      Resp      Temp      Temp src      SpO2      Weight       Height      Head Circumference      Peak Flow      Pain Score      Pain Loc      Pain Edu?      Excl. in GC?      Constitutional: Alert and oriented. Well appearing and in no acute distress. Eyes: Conjunctivae are normal. PERRL. EOMI. Head: Atraumatic. ENT:      Ears:       Nose: No congestion/rhinnorhea.      Mouth/Throat: Mucous membranes are moist.  Neck: No stridor.    Cardiovascular: Normal rate, regular rhythm. Normal S1 and S2.  Good peripheral circulation. Respiratory: Normal respiratory effort without tachypnea or retractions. Lungs CTAB. Good air entry to the bases  with no decreased or absent breath sounds. Musculoskeletal: Full range of motion to all extremities. No gross deformities appreciated.  Visualization of the right lower extremity revealed some edema about the right knee.  No significant gross edema when compared with unaffected side.  Patient does have an in place knee immobilizer from orthopedic urgent care.  There is no tenderness over the patient's hip, proximal femur, tib-fib region, ankle or foot.  Dorsalis pedis pulses appreciated and marked at this time.  Sensation intact all digits. Neurologic:  Normal speech and language. No gross focal neurologic deficits are appreciated.  Skin:  Skin is warm, dry and intact. No rash noted. Psychiatric: Mood and affect are normal. Speech and behavior are normal. Patient exhibits appropriate insight and judgement.   ____________________________________________   LABS (all labs ordered are listed, but only abnormal results are displayed)  Labs Reviewed  COMPREHENSIVE METABOLIC PANEL - Abnormal; Notable for the following components:      Result Value   Glucose, Bld 139 (*)    Calcium 8.8 (*)    All other components within normal limits  CBC WITH DIFFERENTIAL/PLATELET - Abnormal; Notable for the following components:   WBC 19.0 (*)    Hemoglobin 11.0 (*)    HCT 33.8 (*)    Neutro Abs 15.2 (*)    Monocytes  Absolute 1.1 (*)    Abs Immature Granulocytes 0.08 (*)    All other components within normal limits  RESP PANEL BY RT-PCR (FLU A&B, COVID) [id number]   ____________________________________________  EKG   ____________________________________________  RADIOLOGY I personally viewed and evaluated these images as part of my medical decision making, as well as reviewing the written report by the radiologist.  ED Provider Interpretation: Acute, displaced angulated right femoral metadiaphyseal fracture.  DG Chest 1 View  Result Date: 05/12/2020 CLINICAL DATA:  Larey Seat today EXAM: CHEST  1 VIEW COMPARISON:  04/14/2011 FINDINGS: Normal heart size, mediastinal contours, and pulmonary vascularity. Linear scarring in the mid lungs bilaterally and at medial LEFT base. Lungs otherwise clear. No infiltrate, pleural effusion or pneumothorax. Atherosclerotic calcification aorta. IMPRESSION: BILATERAL lung scarring. No acute abnormalities. Aortic Atherosclerosis (ICD10-I70.0). Electronically Signed   By: Ulyses Southward M.D.   On: 05/12/2020 19:06   DG Knee 1-2 Views Right  Result Date: 05/12/2020 CLINICAL DATA:  Fall today EXAM: RIGHT KNEE - 1-2 VIEW COMPARISON:  None FINDINGS: Comminuted displaced oblique fracture of the distal RIGHT femoral metadiaphysis with mild apex posterior angulation and mild overriding. No dislocation. Degenerative changes of RIGHT knee greatest at lateral compartment. No joint effusion. IMPRESSION: Displaced and angulated distal RIGHT femoral metadiaphyseal fracture. Osseous demineralization with degenerative changes RIGHT knee. Electronically Signed   By: Ulyses Southward M.D.   On: 05/12/2020 19:08   DG Hip Unilat W or Wo Pelvis 2-3 Views Right  Result Date: 05/12/2020 CLINICAL DATA:  Fall today EXAM: DG HIP (WITH OR WITHOUT PELVIS) 2-3V RIGHT COMPARISON:  None FINDINGS: Osseous demineralization. Hip and SI joint spaces preserved. Iliopsoas tendon calcification LEFT hip. No acute fracture,  dislocation, or bone destruction. Degenerative disc and facet disease changes at visualized lower lumbar spine. IMPRESSION: No acute osseous abnormalities involving the pelvis or hips. Electronically Signed   By: Ulyses Southward M.D.   On: 05/12/2020 19:07    ____________________________________________    PROCEDURES  Procedure(s) performed:    Procedures    Medications  ceFAZolin (ANCEF) IVPB 2g/100 mL premix (has no administration in time range)  oxyCODONE-acetaminophen (PERCOCET/ROXICET) 5-325 MG per tablet 1 tablet (  1 tablet Oral Not Given 05/12/20 2230)  morphine 4 MG/ML injection 4 mg (4 mg Intravenous Given 05/12/20 2001)  ondansetron (ZOFRAN) injection 4 mg (4 mg Intravenous Given 05/12/20 2000)     ____________________________________________   INITIAL IMPRESSION / ASSESSMENT AND PLAN / ED COURSE  Pertinent labs & imaging results that were available during my care of the patient were reviewed by me and considered in my medical decision making (see chart for details).  Review of the [place] CSRS was performed in accordance of the [facility] prior to dispensing any controlled drugs.           Patient's diagnosis is consistent with distal femur fracture.  Patient presented to the emergency department after mechanical fall today.  Patient was ambulatory on this knee after the injury but presented to [facility] urgent care.  Imaging there revealed displaced fracture and she was sent to the emergency department for evaluation.  Imaging here again confirmed acute angulated displaced fracture of the distal femur.  Patient is vascularly intact.  Patient had no other complaints.  I discussed the case with Ortho who advised that they would perform surgery here.  Dr. Martha Clan requested that we discussed the patient with hospitalist to see if they would admit the patient. Hospitalist agrees to admit the patient at this time. Patient care will be transferred to the hospitalist service pending her  surgical repair of femur fracture.   ____________________________________________  FINAL CLINICAL IMPRESSION(S) / ED DIAGNOSES  Final diagnoses:  Other closed fracture of distal end of right femur, initial encounter (HCC)  Fall, initial encounter      NEW MEDICATIONS STARTED DURING THIS VISIT:  ED Discharge Orders    None          This chart was dictated using voice recognition software/Dragon. Despite best efforts to proofread, errors can occur which can change the meaning. Any change was purely unintentional.    Racheal Patches, PA-C 05/12/20 2233    Chesley Noon, MD 05/13/20 [phone]

## 2020-05-12 NOTE — ED Notes (Signed)
Purewick placed by this RN.  

## 2020-05-12 NOTE — ED Triage Notes (Signed)
Pt here via EMS from [facility]. Pt with fracture to right femur. Pt denies any pain at this time. Pt had a fall at home earlier today. Denied any pain or issues at that time. Pt began to have discomfort and went to [facility] they sent her here to ED

## 2020-05-13 ENCOUNTER — Inpatient Hospital Stay: Payer: Medicare Other | Admitting: Anesthesiology

## 2020-05-13 ENCOUNTER — Encounter
Admission: EM | Disposition: A | Discharge status: Skilled Nursing Facility | Payer: Self-pay | Source: Home / Self Care | Attending: Internal Medicine

## 2020-05-13 ENCOUNTER — Other Ambulatory Visit: Payer: Self-pay

## 2020-05-13 ENCOUNTER — Inpatient Hospital Stay: Payer: Medicare Other

## 2020-05-13 ENCOUNTER — Encounter: Payer: Self-pay | Admitting: Family Medicine

## 2020-05-13 ENCOUNTER — Inpatient Hospital Stay (HOSPITAL_COMMUNITY)
Admit: 2020-05-13 | Discharge: 2020-05-13 | Disposition: A | Discharge status: Home / Self Care | Payer: Medicare Other | Attending: Family Medicine | Admitting: Family Medicine

## 2020-05-13 DIAGNOSIS — Z01818 Encounter for other preprocedural examination: Secondary | ICD-10-CM | POA: Diagnosis not present

## 2020-05-13 DIAGNOSIS — I370 Nonrheumatic pulmonary valve stenosis: Secondary | ICD-10-CM | POA: Diagnosis not present

## 2020-05-13 DIAGNOSIS — I10 Essential (primary) hypertension: Secondary | ICD-10-CM

## 2020-05-13 DIAGNOSIS — M1990 Unspecified osteoarthritis, unspecified site: Secondary | ICD-10-CM

## 2020-05-13 DIAGNOSIS — Z01810 Encounter for preprocedural cardiovascular examination: Secondary | ICD-10-CM

## 2020-05-13 HISTORY — PX: ORIF FEMUR FRACTURE: SHX2119

## 2020-05-13 LAB — COMPREHENSIVE METABOLIC PANEL
ALT: 14 U/L (ref 0–44)
AST: 19 U/L (ref 15–41)
Albumin: 3.4 g/dL — ABNORMAL LOW (ref 3.5–5.0)
Alkaline Phosphatase: 79 U/L (ref 38–126)
Anion gap: 6 (ref 5–15)
BUN: 14 mg/dL (ref 8–23)
CO2: 26 mmol/L (ref 22–32)
Calcium: 8.7 mg/dL — ABNORMAL LOW (ref 8.9–10.3)
Chloride: 109 mmol/L (ref 98–111)
Creatinine, Ser: 0.95 mg/dL (ref 0.44–1.00)
GFR, Estimated: 58 mL/min — ABNORMAL LOW (ref >60)
Glucose, Bld: 135 mg/dL — ABNORMAL HIGH (ref 70–99)
Potassium: 4.1 mmol/L (ref 3.5–5.1)
Sodium: 141 mmol/L (ref 135–145)
Total Bilirubin: 1.3 mg/dL — ABNORMAL HIGH (ref 0.3–1.2)
Total Protein: 6.9 g/dL (ref 6.5–8.1)

## 2020-05-13 LAB — ECHOCARDIOGRAM COMPLETE
AR max vel: 2 cm2
AV Area VTI: 2.6 cm2
AV Area mean vel: 2.33 cm2
AV Mean grad: 4 mmHg
AV Peak grad: 9.6 mmHg
Ao pk vel: 1.55 m/s
Area-P 1/2: 4.17 cm2
Height: 67 in
MV VTI: 1.9 cm2
S' Lateral: 2.2 cm
Single Plane A4C EF: 60.2 %
Weight: 3259.28 oz

## 2020-05-13 LAB — CBC
HCT: 30.6 % — ABNORMAL LOW (ref 36.0–46.0)
Hemoglobin: 9.9 g/dL — ABNORMAL LOW (ref 12.0–15.0)
MCH: 28.1 pg (ref 26.0–34.0)
MCHC: 32.4 g/dL (ref 30.0–36.0)
MCV: 86.9 fL (ref 80.0–100.0)
Platelets: 337 10*3/uL (ref 150–400)
RBC: 3.52 MIL/uL — ABNORMAL LOW (ref 3.87–5.11)
RDW: 15.1 % (ref 11.5–15.5)
WBC: 8.3 10*3/uL (ref 4.0–10.5)
nRBC: 0 % (ref 0.0–0.2)

## 2020-05-13 LAB — SURGICAL PCR SCREEN
MRSA, PCR: NEGATIVE
Staphylococcus aureus: NEGATIVE

## 2020-05-13 LAB — PROTIME-INR
INR: 1.2 (ref 0.8–1.2)
Prothrombin Time: 14.6 seconds (ref 11.4–15.2)

## 2020-05-13 SURGERY — OPEN REDUCTION INTERNAL FIXATION (ORIF) DISTAL FEMUR FRACTURE
Anesthesia: General | Laterality: Right

## 2020-05-13 MED ORDER — MORPHINE SULFATE (PF) 2 MG/ML IV SOLN: 0.5000 mg | INTRAVENOUS | Status: DC | PRN

## 2020-05-13 MED ORDER — SUGAMMADEX SODIUM 200 MG/2ML IV SOLN
INTRAVENOUS | Status: DC | PRN
  Administered 2020-05-13: 200 mg via INTRAVENOUS

## 2020-05-13 MED ORDER — HYDROMORPHONE HCL 1 MG/ML IJ SOLN
INTRAMUSCULAR | Status: DC | PRN
Start: 2020-05-13 — End: 2020-05-13
  Administered 2020-05-13 (×2): .5 mg via INTRAVENOUS

## 2020-05-13 MED ORDER — OXYCODONE HCL 5 MG/5ML PO SOLN: 5.0000 mg | Freq: Once | ORAL | Status: DC | PRN

## 2020-05-13 MED ORDER — PROPOFOL 10 MG/ML IV BOLUS
INTRAVENOUS | Status: DC | PRN
  Administered 2020-05-13: 80 mg via INTRAVENOUS

## 2020-05-13 MED ORDER — SODIUM CHLORIDE 0.9 % IV SOLN
INTRAVENOUS | Status: DC | PRN
  Administered 2020-05-13: 20 ug/min via INTRAVENOUS

## 2020-05-13 MED ORDER — BISACODYL 10 MG RE SUPP: 10.0000 mg | Freq: Every day | RECTAL | Status: DC | PRN

## 2020-05-13 MED ORDER — DOCUSATE SODIUM 100 MG PO CAPS
100.0000 mg | ORAL_CAPSULE | Freq: Two times a day (BID) | ORAL | Status: DC
  Administered 2020-05-13 – 2020-05-16 (×5): 100 mg via ORAL
  Filled 2020-05-13 (×5): qty 1

## 2020-05-13 MED ORDER — FENTANYL CITRATE (PF) 100 MCG/2ML IJ SOLN: 25.0000 ug | INTRAMUSCULAR | Status: DC | PRN

## 2020-05-13 MED ORDER — ONDANSETRON HCL 4 MG/2ML IJ SOLN: 4.0000 mg | Freq: Once | INTRAMUSCULAR | Status: DC | PRN

## 2020-05-13 MED ORDER — EPHEDRINE SULFATE 50 MG/ML IJ SOLN
INTRAMUSCULAR | Status: DC | PRN
  Administered 2020-05-13: 5 mg via INTRAVENOUS
  Administered 2020-05-13: 10 mg via INTRAVENOUS
  Administered 2020-05-13 (×2): 5 mg via INTRAVENOUS

## 2020-05-13 MED ORDER — ACETAMINOPHEN 10 MG/ML IV SOLN
INTRAVENOUS | Status: AC
  Filled 2020-05-13: qty 100

## 2020-05-13 MED ORDER — NITROFURANTOIN MACROCRYSTAL 50 MG PO CAPS
50.0000 mg | ORAL_CAPSULE | Freq: Every day | ORAL | Status: DC
  Administered 2020-05-14 – 2020-05-16 (×3): 50 mg via ORAL
  Filled 2020-05-13 (×4): qty 1

## 2020-05-13 MED ORDER — ALUM & MAG HYDROXIDE-SIMETH 200-200-20 MG/5ML PO SUSP: 30.0000 mL | ORAL | Status: DC | PRN

## 2020-05-13 MED ORDER — POTASSIUM CHLORIDE IN NACL 20-0.45 MEQ/L-% IV SOLN
INTRAVENOUS | Status: DC
  Filled 2020-05-13 (×4): qty 1000

## 2020-05-13 MED ORDER — BRINZOLAMIDE-BRIMONIDINE 1-0.2 % OP SUSP: 1.0000 [drp] | Freq: Two times a day (BID) | OPHTHALMIC | Status: DC

## 2020-05-13 MED ORDER — CEFAZOLIN SODIUM-DEXTROSE 2-4 GM/100ML-% IV SOLN
INTRAVENOUS | Status: AC
  Filled 2020-05-13: qty 100

## 2020-05-13 MED ORDER — TRAMADOL HCL 50 MG PO TABS
50.0000 mg | ORAL_TABLET | Freq: Four times a day (QID) | ORAL | Status: DC
Start: 2020-05-13 — End: 2020-05-16
  Administered 2020-05-13 – 2020-05-16 (×11): 50 mg via ORAL
  Filled 2020-05-13 (×11): qty 1

## 2020-05-13 MED ORDER — ASPIRIN 81 MG PO CHEW
81.0000 mg | CHEWABLE_TABLET | Freq: Every day | ORAL | Status: DC
  Administered 2020-05-14 – 2020-05-16 (×3): 81 mg via ORAL
  Filled 2020-05-13 (×3): qty 1

## 2020-05-13 MED ORDER — ENOXAPARIN SODIUM 40 MG/0.4ML ~~LOC~~ SOLN
40.0000 mg | SUBCUTANEOUS | Status: DC
  Administered 2020-05-14 – 2020-05-16 (×3): 40 mg via SUBCUTANEOUS
  Filled 2020-05-13 (×3): qty 0.4

## 2020-05-13 MED ORDER — SENNA 8.6 MG PO TABS
1.0000 | ORAL_TABLET | Freq: Two times a day (BID) | ORAL | Status: DC
  Administered 2020-05-13 – 2020-05-16 (×5): 8.6 mg via ORAL
  Filled 2020-05-13 (×6): qty 1

## 2020-05-13 MED ORDER — OXYCODONE HCL 5 MG PO TABS: 5.0000 mg | ORAL_TABLET | Freq: Once | ORAL | Status: DC | PRN

## 2020-05-13 MED ORDER — PHENYLEPHRINE HCL (PRESSORS) 10 MG/ML IV SOLN
INTRAVENOUS | Status: DC | PRN
  Administered 2020-05-13: 200 ug via INTRAVENOUS
  Administered 2020-05-13 (×3): 100 ug via INTRAVENOUS

## 2020-05-13 MED ORDER — METHOCARBAMOL 1000 MG/10ML IJ SOLN
500.0000 mg | Freq: Four times a day (QID) | INTRAVENOUS | Status: DC | PRN
  Filled 2020-05-13: qty 5

## 2020-05-13 MED ORDER — LACTATED RINGERS IV SOLN: Freq: Once | INTRAVENOUS | Status: AC

## 2020-05-13 MED ORDER — NEOMYCIN-POLYMYXIN B GU 40-200000 IR SOLN
Status: AC
  Filled 2020-05-13: qty 4

## 2020-05-13 MED ORDER — ACETAMINOPHEN 10 MG/ML IV SOLN
INTRAVENOUS | Status: DC | PRN
  Administered 2020-05-13: 1000 mg via INTRAVENOUS

## 2020-05-13 MED ORDER — FENTANYL CITRATE (PF) 100 MCG/2ML IJ SOLN
INTRAMUSCULAR | Status: DC | PRN
  Administered 2020-05-13: 100 ug via INTRAVENOUS

## 2020-05-13 MED ORDER — NEOMYCIN-POLYMYXIN B GU 40-200000 IR SOLN
Status: DC | PRN
  Administered 2020-05-13: 4 mL

## 2020-05-13 MED ORDER — FENTANYL CITRATE (PF) 100 MCG/2ML IJ SOLN
INTRAMUSCULAR | Status: AC
  Filled 2020-05-13: qty 2

## 2020-05-13 MED ORDER — ONDANSETRON HCL 4 MG/2ML IJ SOLN: 4.0000 mg | Freq: Four times a day (QID) | INTRAMUSCULAR | Status: DC | PRN

## 2020-05-13 MED ORDER — ONDANSETRON HCL 4 MG/2ML IJ SOLN
INTRAMUSCULAR | Status: DC | PRN
  Administered 2020-05-13 (×2): 4 mg via INTRAVENOUS

## 2020-05-13 MED ORDER — LIDOCAINE HCL (CARDIAC) PF 100 MG/5ML IV SOSY
PREFILLED_SYRINGE | INTRAVENOUS | Status: DC | PRN
  Administered 2020-05-13: 80 mg via INTRAVENOUS

## 2020-05-13 MED ORDER — DEXAMETHASONE SODIUM PHOSPHATE 10 MG/ML IJ SOLN
INTRAMUSCULAR | Status: DC | PRN
  Administered 2020-05-13: 10 mg via INTRAVENOUS

## 2020-05-13 MED ORDER — DORZOLAMIDE HCL 2 % OP SOLN
1.0000 [drp] | Freq: Two times a day (BID) | OPHTHALMIC | Status: DC
  Administered 2020-05-13 – 2020-05-16 (×6): 1 [drp] via OPHTHALMIC
  Filled 2020-05-13: qty 10

## 2020-05-13 MED ORDER — CEFAZOLIN SODIUM-DEXTROSE 2-4 GM/100ML-% IV SOLN
2.0000 g | Freq: Four times a day (QID) | INTRAVENOUS | Status: AC
  Administered 2020-05-13 – 2020-05-14 (×2): 2 g via INTRAVENOUS
  Filled 2020-05-13 (×2): qty 100

## 2020-05-13 MED ORDER — ONDANSETRON HCL 4 MG PO TABS: 4.0000 mg | ORAL_TABLET | Freq: Four times a day (QID) | ORAL | Status: DC | PRN

## 2020-05-13 MED ORDER — GLYCOPYRROLATE 0.2 MG/ML IJ SOLN
INTRAMUSCULAR | Status: DC | PRN
  Administered 2020-05-13: .2 mg via INTRAVENOUS

## 2020-05-13 MED ORDER — METHOCARBAMOL 500 MG PO TABS
500.0000 mg | ORAL_TABLET | Freq: Four times a day (QID) | ORAL | Status: DC | PRN
  Administered 2020-05-15: 500 mg via ORAL
  Filled 2020-05-13: qty 1

## 2020-05-13 MED ORDER — ROCURONIUM BROMIDE 100 MG/10ML IV SOLN
INTRAVENOUS | Status: DC | PRN
  Administered 2020-05-13: 10 mg via INTRAVENOUS
  Administered 2020-05-13: 20 mg via INTRAVENOUS
  Administered 2020-05-13: 40 mg via INTRAVENOUS
  Administered 2020-05-13: 50 mg via INTRAVENOUS

## 2020-05-13 MED ORDER — HYDROCODONE-ACETAMINOPHEN 5-325 MG PO TABS: 1.0000 | ORAL_TABLET | ORAL | Status: DC | PRN

## 2020-05-13 MED ORDER — LACTATED RINGERS IV SOLN: INTRAVENOUS | Status: DC | PRN

## 2020-05-13 MED ORDER — HYDROMORPHONE HCL 1 MG/ML IJ SOLN
INTRAMUSCULAR | Status: AC
  Filled 2020-05-13: qty 1

## 2020-05-13 MED ORDER — CEFAZOLIN SODIUM-DEXTROSE 2-3 GM-%(50ML) IV SOLR
INTRAVENOUS | Status: DC | PRN
  Administered 2020-05-13: 2 g via INTRAVENOUS

## 2020-05-13 MED ORDER — POLYETHYLENE GLYCOL 3350 17 G PO PACK: 17.0000 g | PACK | Freq: Every day | ORAL | Status: DC | PRN

## 2020-05-13 MED ORDER — POLYVINYL ALCOHOL 1.4 % OP SOLN
1.0000 [drp] | OPHTHALMIC | Status: DC | PRN
  Filled 2020-05-13: qty 15

## 2020-05-13 SURGICAL SUPPLY — 48 items
BIT DRILL 4.3 (BIT) ×4
BIT DRILL 4.3X300MM (BIT) IMPLANT
BNDG ELASTIC 6X5.8 VLCR STR LF (GAUZE/BANDAGES/DRESSINGS) ×4 IMPLANT
CAP LOCK NCB (Cap) ×5 IMPLANT
CAST PADDING 6X4YD ST 30248 (SOFTGOODS) ×1
COVER WAND RF STERILE (DRAPES) ×2 IMPLANT
DRAPE C-ARM XRAY 36X54 (DRAPES) ×2 IMPLANT
DRAPE C-ARMOR (DRAPES) ×2 IMPLANT
DRSG AQUACEL AG ADV 3.5X14 (GAUZE/BANDAGES/DRESSINGS) ×1 IMPLANT
DURAPREP 26ML APPLICATOR (WOUND CARE) ×5 IMPLANT
ELECT REM PT RETURN 9FT ADLT (ELECTROSURGICAL) ×2
ELECTRODE REM PT RTRN 9FT ADLT (ELECTROSURGICAL) ×1 IMPLANT
GAUZE SPONGE 4X4 12PLY STRL (GAUZE/BANDAGES/DRESSINGS) ×3 IMPLANT
GAUZE XEROFORM 1X8 LF (GAUZE/BANDAGES/DRESSINGS) ×2 IMPLANT
GLOVE SURG 9.0 ORTHO LTXF (GLOVE) ×4 IMPLANT
GLOVE SURG UNDER POLY LF SZ9 (GLOVE) ×2 IMPLANT
GOWN STRL REUS W/ TWL LRG LVL3 (GOWN DISPOSABLE) ×1 IMPLANT
GOWN STRL REUS W/TWL 2XL LVL3 (GOWN DISPOSABLE) ×2 IMPLANT
GOWN STRL REUS W/TWL LRG LVL3 (GOWN DISPOSABLE) ×2
IMMBOLIZER KNEE 19 BLUE UNIV (SOFTGOODS) ×1 IMPLANT
K-WIRE 2.0 (WIRE) ×2
K-WIRE FXSTD 280X2XNS SS (WIRE) ×1
KWIRE FXSTD 280X2XNS SS (WIRE) IMPLANT
MANIFOLD NEPTUNE II (INSTRUMENTS) ×2 IMPLANT
MAT ABSORB  FLUID 56X50 GRAY (MISCELLANEOUS) ×2
MAT ABSORB FLUID 56X50 GRAY (MISCELLANEOUS) ×1 IMPLANT
NDL FILTER BLUNT 18X1 1/2 (NEEDLE) ×1 IMPLANT
NEEDLE FILTER BLUNT 18X 1/2SAF (NEEDLE) ×1
NEEDLE FILTER BLUNT 18X1 1/2 (NEEDLE) ×1 IMPLANT
NS IRRIG 1000ML POUR BTL (IV SOLUTION) ×2 IMPLANT
PACK TOTAL KNEE (MISCELLANEOUS) ×2 IMPLANT
PAD ABD DERMACEA PRESS 5X9 (GAUZE/BANDAGES/DRESSINGS) ×2 IMPLANT
PADDING CAST COTTON 6X4 ST (SOFTGOODS) IMPLANT
PLATE FEMUR RIGHT 9H (Plate) ×1 IMPLANT
SCREW 5.0 32MM (Screw) ×3 IMPLANT
SCREW 5.0 60MM (Screw) ×4 IMPLANT
SCREW 5.0 80MM (Screw) ×1 IMPLANT
SCREW NCB 5.0X34MM (Screw) ×1 IMPLANT
SCREW NCB 5.0X36MM (Screw) ×1 IMPLANT
SCREW NCB 5.0X85MM (Screw) ×4 IMPLANT
SPONGE LAP 18X18 RF (DISPOSABLE) ×2 IMPLANT
STAPLER SKIN PROX 35W (STAPLE) ×3 IMPLANT
STOCKINETTE BIAS CUT 6 980064 (GAUZE/BANDAGES/DRESSINGS) ×2 IMPLANT
STRAP SAFETY 5IN WIDE (MISCELLANEOUS) ×2 IMPLANT
SUT VIC AB 0 CT1 36 (SUTURE) ×5 IMPLANT
SUT VIC AB 2-0 CT1 27 (SUTURE) ×4
SUT VIC AB 2-0 CT1 TAPERPNT 27 (SUTURE) ×2 IMPLANT
SYR 5ML LL (SYRINGE) ×2 IMPLANT

## 2020-05-13 NOTE — Anesthesia Procedure Notes (Signed)
Procedure Name: Intubation Performed by: Fletcher-Harrison, Tawana, CRNA Pre-anesthesia Checklist: Patient identified, Emergency Drugs available, Suction available and Patient being monitored Patient Re-evaluated:Patient Re-evaluated prior to induction Oxygen Delivery Method: Circle system utilized Preoxygenation: Pre-oxygenation with 100% oxygen Induction Type: IV induction Ventilation: Mask ventilation without difficulty Laryngoscope Size: McGraph and 3 Grade View: Grade I Tube type: Oral Tube size: 6.5 mm Number of attempts: 1 Airway Equipment and Method: Stylet and Oral airway Placement Confirmation: ETT inserted through vocal cords under direct vision,  positive ETCO2,  breath sounds checked- equal and bilateral and CO2 detector Secured at: 21 cm Tube secured with: Tape Dental Injury: Teeth and Oropharynx as per pre-operative assessment        

## 2020-05-13 NOTE — Progress Notes (Signed)
Subjective:  Patient seen with her daughter at the bedside today.  Patient was cleared by cardiology for surgery.  Patient is in agreement with the plan for surgical fixation of her right femur fracture.  I discussed the details of the surgery with the patient and her daughter.  Patient had no further questions.  She has no complaints.  Objective:   VITALS:   Vitals:   05/13/20 0425 05/13/20 0535 05/13/20 0814 05/13/20 1216  BP: (!) 176/67 (!) 146/65 (!) 167/58 (!) 181/52  Pulse: 78 86 79 86  Resp: 18  14 14   Temp: 97.6 F (36.4 C)  98.7 F (37.1 C) 97.8 F (36.6 C)  TempSrc:   Oral Oral  SpO2: 99%  99% 97%  Weight:      Height:        PHYSICAL EXAM: Right lower extremity: Skin is intact.  Patient has mild thigh swelling but compartments are soft and compressible.  Distally she is neurovascular intact.  She has shortening and external rotation of her right lower extremity.  Patient can dorsiflex and plantarflex her ankle and flex and extend her toes.   LABS  Results for orders placed or performed during the hospital encounter of 05/12/20 (from the past 24 hour(s))  Comprehensive metabolic panel     Status: Abnormal   Collection Time: 05/12/20  6:03 PM  Result Value Ref Range   Sodium 142 135 - 145 mmol/L   Potassium 4.0 3.5 - 5.1 mmol/L   Chloride 109 98 - 111 mmol/L   CO2 24 22 - 32 mmol/L   Glucose, Bld 139 (H) 70 - 99 mg/dL   BUN 13 8 - 23 mg/dL   Creatinine, Ser 1.610.86 0.44 - 1.00 mg/dL   Calcium 8.8 (L) 8.9 - 10.3 mg/dL   Total Protein 7.6 6.5 - 8.1 g/dL   Albumin 3.7 3.5 - 5.0 g/dL   AST 20 15 - 41 U/L   ALT 16 0 - 44 U/L   Alkaline Phosphatase 85 38 - 126 U/L   Total Bilirubin 1.0 0.3 - 1.2 mg/dL   GFR, Estimated >09>60 >60>60 mL/min   Anion gap 9 5 - 15  CBC with Differential     Status: Abnormal   Collection Time: 05/12/20  6:03 PM  Result Value Ref Range   WBC 19.0 (H) 4.0 - 10.5 K/uL   RBC 3.89 3.87 - 5.11 MIL/uL   Hemoglobin 11.0 (L) 12.0 - 15.0 g/dL   HCT  45.433.8 (L) 09.836.0 - 46.0 %   MCV 86.9 80.0 - 100.0 fL   MCH 28.3 26.0 - 34.0 pg   MCHC 32.5 30.0 - 36.0 g/dL   RDW 11.915.1 14.711.5 - 82.915.5 %   Platelets 360 150 - 400 K/uL   nRBC 0.0 0.0 - 0.2 %   Neutrophils Relative % 81 %   Neutro Abs 15.2 (H) 1.7 - 7.7 K/uL   Lymphocytes Relative 13 %   Lymphs Abs 2.5 0.7 - 4.0 K/uL   Monocytes Relative 6 %   Monocytes Absolute 1.1 (H) 0.1 - 1.0 K/uL   Eosinophils Relative 0 %   Eosinophils Absolute 0.0 0.0 - 0.5 K/uL   Basophils Relative 0 %   Basophils Absolute 0.1 0.0 - 0.1 K/uL   Immature Granulocytes 0 %   Abs Immature Granulocytes 0.08 (H) 0.00 - 0.07 K/uL  Resp Panel by RT-PCR (Flu A&B, Covid) Nasopharyngeal Swab     Status: None   Collection Time: 05/12/20  6:05 PM  Specimen: Nasopharyngeal Swab; Nasopharyngeal(NP) swabs in vial transport medium  Result Value Ref Range   SARS Coronavirus 2 by RT PCR NEGATIVE NEGATIVE   Influenza A by PCR NEGATIVE NEGATIVE   Influenza B by PCR NEGATIVE NEGATIVE  Protime-INR     Status: None   Collection Time: 05/13/20 12:25 AM  Result Value Ref Range   Prothrombin Time 14.6 11.4 - 15.2 seconds   INR 1.2 0.8 - 1.2  Surgical pcr screen     Status: None   Collection Time: 05/13/20  2:40 AM   Specimen: Nasal Mucosa; Nasal Swab  Result Value Ref Range   MRSA, PCR NEGATIVE NEGATIVE   Staphylococcus aureus NEGATIVE NEGATIVE  Comprehensive metabolic panel     Status: Abnormal   Collection Time: 05/13/20  3:44 AM  Result Value Ref Range   Sodium 141 135 - 145 mmol/L   Potassium 4.1 3.5 - 5.1 mmol/L   Chloride 109 98 - 111 mmol/L   CO2 26 22 - 32 mmol/L   Glucose, Bld 135 (H) 70 - 99 mg/dL   BUN 14 8 - 23 mg/dL   Creatinine, Ser 2.67 0.44 - 1.00 mg/dL   Calcium 8.7 (L) 8.9 - 10.3 mg/dL   Total Protein 6.9 6.5 - 8.1 g/dL   Albumin 3.4 (L) 3.5 - 5.0 g/dL   AST 19 15 - 41 U/L   ALT 14 0 - 44 U/L   Alkaline Phosphatase 79 38 - 126 U/L   Total Bilirubin 1.3 (H) 0.3 - 1.2 mg/dL   GFR, Estimated 58 (L) >60  mL/min   Anion gap 6 5 - 15  CBC     Status: Abnormal   Collection Time: 05/13/20  3:44 AM  Result Value Ref Range   WBC 8.3 4.0 - 10.5 K/uL   RBC 3.52 (L) 3.87 - 5.11 MIL/uL   Hemoglobin 9.9 (L) 12.0 - 15.0 g/dL   HCT 12.4 (L) 58.0 - 99.8 %   MCV 86.9 80.0 - 100.0 fL   MCH 28.1 26.0 - 34.0 pg   MCHC 32.4 30.0 - 36.0 g/dL   RDW 33.8 25.0 - 53.9 %   Platelets 337 150 - 400 K/uL   nRBC 0.0 0.0 - 0.2 %    DG Chest 1 View  Result Date: 05/12/2020 CLINICAL DATA:  Larey Seat today EXAM: CHEST  1 VIEW COMPARISON:  04/14/2011 FINDINGS: Normal heart size, mediastinal contours, and pulmonary vascularity. Linear scarring in the mid lungs bilaterally and at medial LEFT base. Lungs otherwise clear. No infiltrate, pleural effusion or pneumothorax. Atherosclerotic calcification aorta. IMPRESSION: BILATERAL lung scarring. No acute abnormalities. Aortic Atherosclerosis (ICD10-I70.0). Electronically Signed   By: Ulyses Southward M.D.   On: 05/12/2020 19:06   DG Knee 1-2 Views Right  Result Date: 05/12/2020 CLINICAL DATA:  Fall today EXAM: RIGHT KNEE - 1-2 VIEW COMPARISON:  None FINDINGS: Comminuted displaced oblique fracture of the distal RIGHT femoral metadiaphysis with mild apex posterior angulation and mild overriding. No dislocation. Degenerative changes of RIGHT knee greatest at lateral compartment. No joint effusion. IMPRESSION: Displaced and angulated distal RIGHT femoral metadiaphyseal fracture. Osseous demineralization with degenerative changes RIGHT knee. Electronically Signed   By: Ulyses Southward M.D.   On: 05/12/2020 19:08   ECHOCARDIOGRAM COMPLETE  Result Date: 05/13/2020    ECHOCARDIOGRAM REPORT   Patient Name:   Deanna Wall Date of Exam: 05/13/2020 Medical Rec #:  [id number]       Height:       67.0 in Accession #:  [id number]      Weight:       203.7 lb Date of Birth:  1931/08/30        BSA:          2.038 m Patient Age:    88 years        BP:           167/58 mmHg Patient Gender: F               HR:            82 bpm. Exam Location:  [facility] Procedure: 2D Echo, Color Doppler and Cardiac Doppler Indications:     Preoperative evaluation  History:         Patient has prior history of Echocardiogram examinations. Risk                  Factors:Hypertension and Dyslipidemia.  Sonographer:     Humphrey Rolls RDCS (AE) Referring Phys:  [id number] Tonye Royalty Diagnosing Phys: Lorine Bears MD  Sonographer Comments: Technically difficult study due to poor echo windows. IMPRESSIONS  1. Left ventricular ejection fraction, by estimation, is 55 to 60%. The left ventricle has normal function. The left ventricle has no regional wall motion abnormalities. Left ventricular diastolic parameters are consistent with Grade I diastolic dysfunction (impaired relaxation).  2. Right ventricular systolic function is normal. The right ventricular size is normal. Tricuspid regurgitation signal is inadequate for assessing PA pressure.  3. The mitral valve is normal in structure. No evidence of mitral valve regurgitation. No evidence of mitral stenosis.  4. The aortic valve was not well visualized. Aortic valve regurgitation is not visualized. Mild to moderate aortic valve sclerosis/calcification is present, without any evidence of aortic stenosis.  5. The inferior vena cava is normal in size with greater than 50% respiratory variability, suggesting right atrial pressure of 3 mmHg. FINDINGS  Left Ventricle: Left ventricular ejection fraction, by estimation, is 55 to 60%. The left ventricle has normal function. The left ventricle has no regional wall motion abnormalities. The left ventricular internal cavity size was normal in size. There is  no left ventricular hypertrophy. Left ventricular diastolic parameters are consistent with Grade I diastolic dysfunction (impaired relaxation). Right Ventricle: The right ventricular size is normal. No increase in right ventricular wall thickness. Right ventricular systolic function is normal. Tricuspid  regurgitation signal is inadequate for assessing PA pressure. Left Atrium: Left atrial size was normal in size. Right Atrium: Right atrial size was normal in size. Pericardium: There is no evidence of pericardial effusion. Mitral Valve: The mitral valve is normal in structure. No evidence of mitral valve regurgitation. No evidence of mitral valve stenosis. MV peak gradient, 6.1 mmHg. The mean mitral valve gradient is 2.0 mmHg. Tricuspid Valve: The tricuspid valve is normal in structure. Tricuspid valve regurgitation is not demonstrated. No evidence of tricuspid stenosis. Aortic Valve: The aortic valve was not well visualized. Aortic valve regurgitation is not visualized. Mild to moderate aortic valve sclerosis/calcification is present, without any evidence of aortic stenosis. Aortic valve mean gradient measures 4.0 mmHg.  Aortic valve peak gradient measures 9.6 mmHg. Aortic valve area, by VTI measures 2.60 cm. Pulmonic Valve: The pulmonic valve was normal in structure. Pulmonic valve regurgitation is mild. No evidence of pulmonic stenosis. Aorta: The aortic root is normal in size and structure. Venous: The inferior vena cava is normal in size with greater than 50% respiratory variability, suggesting right atrial pressure of 3 mmHg. IAS/Shunts: No atrial level shunt  detected by color flow Doppler.  LEFT VENTRICLE PLAX 2D LVIDd:         3.20 cm     Diastology LVIDs:         2.20 cm     LV e' medial:    7.40 cm/s LV PW:         0.80 cm     LV E/e' medial:  9.5 LV IVS:        0.90 cm     LV e' lateral:   7.83 cm/s LVOT diam:     2.10 cm     LV E/e' lateral: 9.0 LV SV:         53 LV SV Index:   26 LVOT Area:     3.46 cm  LV Volumes (MOD) LV vol d, MOD A4C: 71.9 ml LV vol s, MOD A4C: 28.6 ml LV SV MOD A4C:     71.9 ml LEFT ATRIUM         Index LA diam:    2.40 cm 1.18 cm/m  AORTIC VALVE                   PULMONIC VALVE AV Area (Vmax):    2.00 cm    PV Vmax:       1.62 m/s AV Area (Vmean):   2.33 cm    PV Vmean:       103.000 cm/s AV Area (VTI):     2.60 cm    PV VTI:        0.221 m AV Vmax:           155.00 cm/s PV Peak grad:  10.5 mmHg AV Vmean:          92.200 cm/s PV Mean grad:  5.0 mmHg AV VTI:            0.205 m AV Peak Grad:      9.6 mmHg AV Mean Grad:      4.0 mmHg LVOT Vmax:         89.40 cm/s LVOT Vmean:        61.900 cm/s LVOT VTI:          0.154 m LVOT/AV VTI ratio: 0.75  AORTA Ao Root diam: 2.70 cm MITRAL VALVE MV Area (PHT): 4.17 cm     SHUNTS MV Area VTI:   1.90 cm     Systemic VTI:  0.15 m MV Peak grad:  6.1 mmHg     Systemic Diam: 2.10 cm MV Mean grad:  2.0 mmHg MV Vmax:       1.23 m/s MV Vmean:      69.7 cm/s MV Decel Time: 182 msec MV E velocity: 70.30 cm/s MV A velocity: 105.00 cm/s MV E/A ratio:  0.67 Lorine Bears MD Electronically signed by Lorine Bears MD Signature Date/Time: 05/13/2020/11:25:07 AM    Final    DG Hip Unilat W or Wo Pelvis 2-3 Views Right  Result Date: 05/12/2020 CLINICAL DATA:  Fall today EXAM: DG HIP (WITH OR WITHOUT PELVIS) 2-3V RIGHT COMPARISON:  None FINDINGS: Osseous demineralization. Hip and SI joint spaces preserved. Iliopsoas tendon calcification LEFT hip. No acute fracture, dislocation, or bone destruction. Degenerative disc and facet disease changes at visualized lower lumbar spine. IMPRESSION: No acute osseous abnormalities involving the pelvis or hips. Electronically Signed   By: Ulyses Southward M.D.   On: 05/12/2020 19:07    Assessment/Plan: Day of Surgery   Active Problems:   Closed fracture of right  distal femur [facility])  Patient scheduled for ORIF of the right femur fracture today.  She has been n.p.o. after midnight.  Patient's heparin was stopped in preparation for surgery.  I reviewed the patient's x-rays and labs in preparation for this case.    Juanell Fairly , MD 05/13/2020, 2:03 PM

## 2020-05-13 NOTE — Op Note (Addendum)
05/13/2020  7:19 PM  PATIENT:  Deanna Wall    PRE-OPERATIVE DIAGNOSIS:  Displace distal RIGHT Femur Fracture  POST-OPERATIVE DIAGNOSIS:  Same  PROCEDURE:  OPEN REDUCTION INTERNAL FIXATION (ORIF) DISTAL RIGHT FEMUR FRACTURE  SURGEON:  Thornton Park, MD  ANESTHESIA:   General  PREOPERATIVE INDICATIONS:  Deanna Wall is a  87 y.o. female with a diagnosis of Displaced Distal right femur fracture.  Given the fracture displacement it was recommended the patient undergo open reduction internal fixation.  I discussed the risks and benefits of surgery preoperatively with the patient and her daughter. The risks include but are not limited to infection, bleeding requiring blood transfusion, nerve or blood vessel injury, joint stiffness or loss of motion, persistent pain, weakness or instability, leg length discrepancy, change in lower extremity rotation, malunion, nonunion and hardware failure and the need for further surgery. Medical risks include but are not limited to DVT and pulmonary embolism, myocardial infarction, stroke, pneumonia, respiratory failure and death. Patient understood these risks and wished to proceed.   OPERATIVE IMPLANTS: Biomet distal femoral fracture plate  OPERATIVE FINDINGS: Comminuted fracture of the right distal femur with significant displacement  OPERATIVE PROCEDURE: Patient was met in the preoperative area.  The right leg was marked according the hospital's correct site of surgery protocol.  Answered all questions by the patient and her daughter. Patient was brought to the operating room. She was placed supine on the operative table after undergoing general anesthesia.  Patient is a prepped and draped in a sterile fashion. A timeout was performed to verify the patient's name, date of birth, medical record number, correct site of surgery and correct procedure to be performed. His also used to verify the patient received antibiotics that all appropriate  instruments, implants and radiographic studies were available in the room. Once all in attendance were in agreement, the case began. The patient received 2 g of Ancef prior to the onset of the case.  C-arm images were taken of the fracture after the left lower extremity was placed over a large triangle.  Longitudinal traction and manual reduction was performed to align the fracture appropriately. A long lateral incision was then made along the femur extending from the knee to approximately three quarters of the way up her thigh.  The key elevator was used to elevate the subcutaneous tissue off the deep fascia. A deep blade was then used to incise the fascia exposing the underlying vastus lateralis muscle. A Zimmer NCB distal femoral plate was then placed using a submuscular approach.  A large "[place] claw" fracture reduction clamp was used to reduce the fracture and approximate the plate along the lateral femur.  The plate was provisionally fixed distally with a K wire. C-arm images of the plate placement were performed. Once the plate was deemed in adequate position, it was fixed distally with bicortical screws into the condyles.  Bicortical screws were placed across the condyles, distal to the comminuted portion of the femoral metaphysis.  The attention was then turned to proximal fixation. A bridge plating technique was used spanning the extensive comminution in the metaphysis.   Bicortical screws were then placed in the proximal femoral shaft.   Once all screws were in place final C-arm images of the construct were taken.  The wound was copiously irrigated.    The deep fascia was then closed with interrupted 0 Vicryl sutures. The wound was then again copiously irrigated. The subcutaneous tissue was closed in 2 layers both with 0  and 2-0 Vicryl. The skin was approximated with staples. A dry sterile dressing was applied.  I was scrubbed and present the entire case and all sharp, sponge and instrument counts  were correct at the conclusion the case.

## 2020-05-13 NOTE — Consult Note (Signed)
Cardiology Consultation:   Patient ID: Deanna Wall; [id number]; [birth year]   Admit date: 05/12/2020 Date of Consult: 05/13/2020  Primary Care Provider: Corwin Levins, MD Primary Cardiologist: Allyson Sabal Primary Electrophysiologist:  None   Patient Profile:   Deanna Wall is a 87 y.o. female with a hx of MS, HTN, HLD, OA, pernicious anemia, and glaucoma who is being seen today for preoperative cardiac risk stratification at the request of Dr. Martha Clan.  History of Present Illness:   Deanna Wall was evaluated by Dr. Gery Pray in 04/2018 at the request of her PCP for asymptomatic sinus bradycardia.  Echo at that time showed an EF of 55 to 60%, mildly increased LV wall thickness with diastolic dysfunction, normal RV systolic function and ventricular cavity size, mild thickening of the mitral valve leaflet, normal size aortic root and ascending aorta.  Her bradycardic heart rates were attributed to eyedrops which she had recently started by her optometrist.  No further cardiac evaluation or therapy was indicated.  She was admitted to the hospital on 05/12/2020 after sustaining a mechanical fall in the setting of weakness, possibly associated with underlying MS.  No syncope, chest pain, palpitations, dyspnea, or dizziness.  Upon the patient's arrival to [facility] they were found to have stable vitals. EKG showed sinus rhythm without acute ST-T changes, CXR showed bilateral lung scarring without acute abnormalities.  She was found to have a displaced and angulated distal right femoral fracture.  Orthopedic surgery is planning for surgical repair later this afternoon.  Echo demonstrated preserved LV systolic function with an EF of 55 to 60%, no regional wall motion abnormalities, grade 1 diastolic dysfunction, normal RV systolic function and ventricular cavity size, mild to moderate aortic valve sclerosis without evidence of stenosis, and an estimated right atrial pressure of 3 mmHg.  She is able to achieve >  4 METs without cardiac limitation. She has done well since her visit with [place] in 2020 and denies any cardiac concerns.   Past Medical History:  Diagnosis Date  . ANEMIA, PERNICIOUS 01/30/2009  . Cataracts, bilateral   . DEGENERATIVE DISC DISEASE 10/28/2006  . ECZEMA 10/28/2006  . Gait disorder   . Glaucoma   . HYPERLIPIDEMIA 02/16/2010  . HYPERTENSION 10/28/2006  . Impaired glucose tolerance 12/31/2010  . MS (multiple sclerosis) (HCC)   . Multiple sclerosis (HCC) 10/28/2006  . Obesity   . OSTEOARTHRITIS, KNEES, BILATERAL, SEVERE 06/14/2009  . Sebaceous cyst    chest   . VITAMIN B12 DEFICIENCY 06/14/2009    Past Surgical History:  Procedure Laterality Date  . COLONOSCOPY    . TUBAL LIGATION  1958  . UPPER GASTROINTESTINAL ENDOSCOPY       Home Meds: Prior to Admission medications   Medication Sig Start Date End Date Taking? Authorizing Provider  potassium chloride (KLOR-CON) 10 MEQ tablet Take 1 tablet by mouth once daily 03/11/20   Corwin Levins, MD  amLODipine [facility]) 5 MG tablet Take 1 tablet by mouth once daily 02/14/20   Corwin Levins, MD  aspirin 81 MG tablet Take 81 mg by mouth daily.      [provider]  citalopram (CELEXA) 10 MG tablet Take 1 tablet by mouth once daily 01/04/20   Corwin Levins, MD  cloNIDine (CATAPRES) 0.3 MG tablet Take 1 tablet by mouth twice daily 06/26/19   Corwin Levins, MD  diclofenac sodium (VOLTAREN) 1 % GEL Apply 2 g topically 4 (four) times daily as needed. 11/02/18  Corwin Levins, MD  ezetimibe (ZETIA) 10 MG tablet Take 1 tablet (10 mg total) by mouth daily. Annual appt is due in  must see provider for future refills 06/08/19   Corwin Levins, MD  furosemide (LASIX) 20 MG tablet TAKE 1 TABLET BY MOUTH ONCE DAILY AS NEEDED FOR  SWELLING 02/07/19   Corwin Levins, MD  latanoprost (XALATAN) 0.005 % ophthalmic solution INSTILL 1 DROP IN BOTH EYES AT BEDTIME. KEEP THE BOTTLE IN THE FRIDGE UNTIL YOU OPEN IT. 11/08/14   [provider]   nitrofurantoin (MACRODANTIN) 50 MG capsule Take 1 capsule by mouth once daily 06/26/19   Corwin Levins, MD  polyethylene glycol powder (GLYCOLAX/MIRALAX) 17 GM/SCOOP powder Take 17 g by mouth 2 (two) times daily as needed. 11/10/18   Corwin Levins, MD  SIMBRINZA 1-0.2 % SUSP  01/23/18   [provider]  vitamin B-12 (CYANOCOBALAMIN) 500 MCG tablet Take 500 mcg by mouth daily.    [provider]  Vitamin D, Ergocalciferol, (DRISDOL) 1.25 MG (50000 UNIT) CAPS capsule Take 1 capsule (50,000 Units total) by mouth every 7 (seven) days. 05/11/19   Corwin Levins, MD    Inpatient Medications: Scheduled Meds: . amLODipine  5 mg Oral Daily  . citalopram  10 mg Oral Daily  . cloNIDine  0.3 mg Oral BID  . ezetimibe  10 mg Oral Daily  . latanoprost  1 drop Both Eyes QHS  . oxyCODONE-acetaminophen  1 tablet Oral Once  . potassium chloride  10 mEq Oral Daily  . vitamin B-12  500 mcg Oral Daily   Continuous Infusions: .  ceFAZolin (ANCEF) IV     PRN Meds: acetaminophen **OR** acetaminophen, albuterol, bisacodyl, HYDROcodone-acetaminophen, ipratropium, magnesium citrate, morphine injection, ondansetron **OR** ondansetron (ZOFRAN) IV, senna-docusate, traZODone  Allergies:   Allergies  Allergen Reactions  . Atorvastatin     REACTION: feels funny in face and head    Social History:   Social History   Socioeconomic History  . Marital status: Widowed    Spouse name: Not on file  . Number of children: 5  . Years of education: 11TH   . Highest education level: Not on file  Occupational History  . Occupation: homemaker    Employer: RETIRED  Tobacco Use  . Smoking status: Never Smoker  . Smokeless tobacco: Never Used  Substance and Sexual Activity  . Alcohol use: No  . Drug use: No  . Sexual activity: Not on file  Other Topics Concern  . Not on file  Social History Narrative  . Not on file   Social Determinants of Health   Financial Resource Strain: Not on file  Food  Insecurity: Not on file  Transportation Needs: Not on file  Physical Activity: Not on file  Stress: Not on file  Social Connections: Not on file  Intimate Partner Violence: Not on file     Family History:   Family History  Problem Relation Age of Onset  . Bone cancer Mother   . Arthritis Mother   . Cancer Mother        bone  . Diabetes Sister   . Arthritis Sister     ROS:  Review of Systems  Constitutional: Positive for malaise/fatigue. Negative for chills, diaphoresis, fever and weight loss.  HENT: Negative for congestion.   Eyes: Negative for discharge and redness.  Respiratory: Negative for cough, sputum production, shortness of breath and wheezing.   Cardiovascular: Negative for chest pain, palpitations, orthopnea, claudication,  leg swelling and PND.  Gastrointestinal: Negative for abdominal pain, heartburn, nausea and vomiting.  Musculoskeletal: Positive for falls and joint pain. Negative for myalgias.  Skin: Negative for rash.  Neurological: Positive for weakness. Negative for dizziness, tingling, tremors, sensory change, speech change, focal weakness and loss of consciousness.  Endo/Heme/Allergies: Does not bruise/bleed easily.  Psychiatric/Behavioral: Negative for substance abuse. The patient is not nervous/anxious.   All other systems reviewed and are negative.     Physical Exam/Data:   Vitals:   05/13/20 0136 05/13/20 0425 05/13/20 0535 05/13/20 0814  BP: (!) 152/72 (!) 176/67 (!) 146/65 (!) 167/58  Pulse: 91 78 86 79  Resp: 20 18  14   Temp: 98.2 F (36.8 C) 97.6 F (36.4 C)  98.7 F (37.1 C)  TempSrc: Oral   Oral  SpO2: 99% 99%  99%  Weight: 92.4 kg     Height:        Intake/Output Summary (Last 24 hours) at 05/13/2020 1141 Last data filed at 05/13/2020 0950 Gross per 24 hour  Intake -  Output 800 ml  Net -800 ml   Filed Weights   05/12/20 1759 05/13/20 0136  Weight: 113 kg 92.4 kg   Body mass index is 31.9 kg/m.   Physical Exam: General:  Well developed, well nourished, in no acute distress. Head: Normocephalic, atraumatic, sclera non-icteric, no xanthomas, nares without discharge.  Neck: Negative for carotid bruits. JVD not elevated. Lungs: Clear bilaterally to auscultation without wheezes, rales, or rhonchi. Breathing is unlabored. Anterior exam to minimize movement secondary to fractured femur.  Heart: RRR with S1 S2. No murmurs, rubs, or gallops appreciated. Abdomen: Soft, non-tender, non-distended with normoactive bowel sounds. No hepatomegaly. No rebound/guarding. No obvious abdominal masses. Msk:  Strength and tone appear normal for age. Extremities: No clubbing or cyanosis. No edema. Distal pedal pulses are 2+ and equal bilaterally. Neuro: Alert and oriented X 3. No facial asymmetry. No focal deficit. Moves all extremities spontaneously. Psych:  Responds to questions appropriately with a normal affect.   EKG:  The EKG was personally reviewed and demonstrates: NSR, 64 bpm,, LVH, no acute ST-T changes Telemetry:  Telemetry was personally reviewed and demonstrates: Not on telemetry  Weights: Filed Weights   05/12/20 1759 05/13/20 0136  Weight: 113 kg 92.4 kg    Relevant CV Studies:  2D echo 05/13/2020: 1. Left ventricular ejection fraction, by estimation, is 55 to 60%. The  left ventricle has normal function. The left ventricle has no regional  wall motion abnormalities. Left ventricular diastolic parameters are  consistent with Grade I diastolic  dysfunction (impaired relaxation).  2. Right ventricular systolic function is normal. The right ventricular  size is normal. Tricuspid regurgitation signal is inadequate for assessing  PA pressure.  3. The mitral valve is normal in structure. No evidence of mitral valve  regurgitation. No evidence of mitral stenosis.  4. The aortic valve was not well visualized. Aortic valve regurgitation  is not visualized. Mild to moderate aortic valve sclerosis/calcification  is  present, without any evidence of aortic stenosis.  5. The inferior vena cava is normal in size with greater than 50%  respiratory variability, suggesting right atrial pressure of 3 mmHg.  __________  2D echo 05/19/2018: 1. The left ventricle has normal systolic function, with an ejection  fraction of 55-60%. The cavity size was normal. There is mildly increased  left ventricular wall thickness. Left ventricular diastolic Doppler  parameters are consistent with impaired  relaxation Indeterminent filling pressures The E/e' is  8-15. No evidence  of left ventricular regional wall motion abnormalities.  2. The right ventricle has normal systolic function. The cavity was  normal. There is no increase in right ventricular wall thickness.  3. The mitral valve is normal in structure. Mild thickening of the mitral  valve leaflet.  4. The tricuspid valve is normal in structure.  5. The aortic valve is tricuspid.  6. The aortic root and ascending aorta are normal in size and structure.  7. The inferior vena cava was normal in size with <50% respiratory  variability.  8. No evidence of left ventricular regional wall motion abnormalities.   Laboratory Data:  Chemistry Recent Labs  Lab 05/12/20 1803 05/13/20 0344  NA 142 141  K 4.0 4.1  CL 109 109  CO2 24 26  GLUCOSE 139* 135*  BUN 13 14  CREATININE 0.86 0.95  CALCIUM 8.8* 8.7*  GFRNONAA >60 58*  ANIONGAP 9 6    Recent Labs  Lab 05/12/20 1803 05/13/20 0344  PROT 7.6 6.9  ALBUMIN 3.7 3.4*  AST 20 19  ALT 16 14  ALKPHOS 85 79  BILITOT 1.0 1.3*   Hematology Recent Labs  Lab 05/12/20 1803 05/13/20 0344  WBC 19.0* 8.3  RBC 3.89 3.52*  HGB 11.0* 9.9*  HCT 33.8* 30.6*  MCV 86.9 86.9  MCH 28.3 28.1  MCHC 32.5 32.4  RDW 15.1 15.1  PLT 360 337   Cardiac EnzymesNo results for input(s): TROPONINI in the last 168 hours. No results for input(s): TROPIPOC in the last 168 hours.  BNPNo results for input(s): BNP, PROBNP in  the last 168 hours.  DDimer No results for input(s): DDIMER in the last 168 hours.  Radiology/Studies:  DG Chest 1 View  Result Date: 05/12/2020 IMPRESSION: BILATERAL lung scarring. No acute abnormalities. Aortic Atherosclerosis (ICD10-I70.0). Electronically Signed   By: Ulyses Southward M.D.   On: 05/12/2020 19:06   DG Knee 1-2 Views Right  Result Date: 05/12/2020 IMPRESSION: Displaced and angulated distal RIGHT femoral metadiaphyseal fracture. Osseous demineralization with degenerative changes RIGHT knee. Electronically Signed   By: Ulyses Southward M.D.   On: 05/12/2020 19:08      DG Hip Unilat W or Wo Pelvis 2-3 Views Right  Result Date: 05/12/2020 IMPRESSION: No acute osseous abnormalities involving the pelvis or hips. Electronically Signed   By: Ulyses Southward M.D.   On: 05/12/2020 19:07    Assessment and Plan:   1.  Preoperative cardiac risk stratification: -Patient needing to undergo surgical repair of a right displaced distal femur fracture in the setting of a mechanical fall -[facility] Activity Status Index indicates she is able to achieve >4  METs without cardiac limitation -Revised Cardiac Risk Index low risk for non-cardiac surgery -Echo performed this admission showed preserved LV systolic function with normal wall motion -EKG nonacute -She may proceed with non-cardiac surgery from a cardiac perspective without further cardiac testing    For questions or updates, please contact [facility] Please consult [email or web address] for contact info under Cardiology/STEMI.   Signed, Eula Listen, PA-C [facility] Pager: [phone] 05/13/2020, 11:41 AM

## 2020-05-13 NOTE — Plan of Care (Signed)
  Problem: Clinical Measurements: Goal: Will remain free from infection Outcome: Progressing   Problem: Activity: Goal: Risk for activity intolerance will decrease Outcome: Progressing   Problem: Nutrition: Goal: Adequate nutrition will be maintained Outcome: Progressing   Problem: Coping: Goal: Level of anxiety will decrease Outcome: Progressing   Problem: Elimination: Goal: Will not experience complications related to bowel motility Outcome: Progressing Goal: Will not experience complications related to urinary retention Outcome: Progressing   Problem: Pain Managment: Goal: General experience of comfort will improve Outcome: Progressing   Problem: Safety: Goal: Ability to remain free from injury will improve Outcome: Progressing   Problem: Skin Integrity: Goal: Risk for impaired skin integrity will decrease Outcome: Progressing   

## 2020-05-13 NOTE — Progress Notes (Signed)
*  PRELIMINARY RESULTS* Echocardiogram 2D Echocardiogram has been performed.  Deanna Wall 05/13/2020, 10:57 AM

## 2020-05-13 NOTE — Progress Notes (Signed)
PROGRESS NOTE    Deanna Wall  NLG:[id number] DOB: 07/09/1931 DOA: 05/12/2020 PCP: Corwin Levins, MD    Brief Narrative:  Deanna Wall is a 87 y.o. female with a known history of HTN, HLD, MS, OA, pernicious anemia, glaucoma presents to the emergency department for evaluation of right femur fracture.  Patient was in a usual state of health until this afternoon when she sustained a mechanical fall at home, slipped and fell twisting her leg.  She was evaluated at [facility] where xrays revealed a displaced distal femur fracture  2/21-cardiology cleared pt for surgery.  Patient overall at low risk from cardiac standpoint, no need for ischemic cardiac evaluation  Consultants:   Cardiology, Ortho  Procedures:   Antimicrobials:       Subjective: No complaints this am. Pain controlled.   Objective: Vitals:   05/13/20 0136 05/13/20 0425 05/13/20 0535 05/13/20 0814  BP: (!) 152/72 (!) 176/67 (!) 146/65 (!) 167/58  Pulse: 91 78 86 79  Resp: 20 18  14   Temp: 98.2 F (36.8 C) 97.6 F (36.4 C)  98.7 F (37.1 C)  TempSrc: Oral   Oral  SpO2: 99% 99%  99%  Weight: 92.4 kg     Height:        Intake/Output Summary (Last 24 hours) at 05/13/2020 0823 Last data filed at 05/13/2020 0535 Gross per 24 hour  Intake --  Output 450 ml  Net -450 ml   Filed Weights   05/12/20 1759 05/13/20 0136  Weight: 113 kg 92.4 kg    Examination:  General exam: Appears calm and comfortable  Respiratory system: Clear to auscultation. Respiratory effort normal. Cardiovascular system: S1 & S2 heard, RRR. No JVD, murmurs, rubs, gallops or clicks.  Gastrointestinal system: Abdomen is nondistended, soft and nontender. No organomegaly or masses felt. Normal bowel sounds heard. Central nervous system: Alert and oriented.  Grossly intact Extremities: No edema, right leg in brace Skin: Warm dry Psychiatry: Mood & affect appropriate.     Data Reviewed: I have personally reviewed following labs and  imaging studies  CBC: Recent Labs  Lab 05/12/20 1803 05/13/20 0344  WBC 19.0* 8.3  NEUTROABS 15.2*  --   HGB 11.0* 9.9*  HCT 33.8* 30.6*  MCV 86.9 86.9  PLT 360 337   Basic Metabolic Panel: Recent Labs  Lab 05/12/20 1803 05/13/20 0344  NA 142 141  K 4.0 4.1  CL 109 109  CO2 24 26  GLUCOSE 139* 135*  BUN 13 14  CREATININE 0.86 0.95  CALCIUM 8.8* 8.7*   GFR: Estimated Creatinine Clearance: 49.6 mL/min (by C-G formula based on SCr of 0.95 mg/dL). Liver Function Tests: Recent Labs  Lab 05/12/20 1803 05/13/20 0344  AST 20 19  ALT 16 14  ALKPHOS 85 79  BILITOT 1.0 1.3*  PROT 7.6 6.9  ALBUMIN 3.7 3.4*   No results for input(s): LIPASE, AMYLASE in the last 168 hours. No results for input(s): AMMONIA in the last 168 hours. Coagulation Profile: Recent Labs  Lab 05/13/20 0025  INR 1.2   Cardiac Enzymes: No results for input(s): CKTOTAL, CKMB, CKMBINDEX, TROPONINI in the last 168 hours. BNP (last 3 results) No results for input(s): PROBNP in the last 8760 hours. HbA1C: No results for input(s): HGBA1C in the last 72 hours. CBG: No results for input(s): GLUCAP in the last 168 hours. Lipid Profile: No results for input(s): CHOL, HDL, LDLCALC, TRIG, CHOLHDL, LDLDIRECT in the last 72 hours. Thyroid Function Tests: No results for  input(s): TSH, T4TOTAL, FREET4, T3FREE, THYROIDAB in the last 72 hours. Anemia Panel: No results for input(s): VITAMINB12, FOLATE, FERRITIN, TIBC, IRON, RETICCTPCT in the last 72 hours. Sepsis Labs: No results for input(s): PROCALCITON, LATICACIDVEN in the last 168 hours.  Recent Results (from the past 240 hour(s))  Resp Panel by RT-PCR (Flu A&B, Covid) Nasopharyngeal Swab     Status: None   Collection Time: 05/12/20  6:05 PM   Specimen: Nasopharyngeal Swab; Nasopharyngeal(NP) swabs in vial transport medium  Result Value Ref Range Status   SARS Coronavirus 2 by RT PCR NEGATIVE NEGATIVE Final    Comment: (NOTE) SARS-CoV-2 target nucleic  acids are NOT DETECTED.  The SARS-CoV-2 RNA is generally detectable in upper respiratory specimens during the acute phase of infection. The lowest concentration of SARS-CoV-2 viral copies this assay can detect is 138 copies/mL. A negative result does not preclude SARS-Cov-2 infection and should not be used as the sole basis for treatment or other patient management decisions. A negative result may occur with  improper specimen collection/handling, submission of specimen other than nasopharyngeal swab, presence of viral mutation(s) within the areas targeted by this assay, and inadequate number of viral copies(<138 copies/mL). A negative result must be combined with clinical observations, patient history, and epidemiological information. The expected result is Negative.  Fact Sheet for Patients:  BloggerCourse.com  Fact Sheet for Healthcare Providers:  SeriousBroker.it  This test is no t yet approved or cleared by the [place] FDA and  has been authorized for detection and/or diagnosis of SARS-CoV-2 by FDA under an Emergency Use Authorization (EUA). This EUA will remain  in effect (meaning this test can be used) for the duration of the COVID-19 declaration under Section 564(b)(1) of the Act, 21 U.S.C.section 360bbb-3(b)(1), unless the authorization is terminated  or revoked sooner.       Influenza A by PCR NEGATIVE NEGATIVE Final   Influenza B by PCR NEGATIVE NEGATIVE Final    Comment: (NOTE) The Xpert Xpress SARS-CoV-2/FLU/RSV plus assay is intended as an aid in the diagnosis of influenza from Nasopharyngeal swab specimens and should not be used as a sole basis for treatment. Nasal washings and aspirates are unacceptable for Xpert Xpress SARS-CoV-2/FLU/RSV testing.  Fact Sheet for Patients: BloggerCourse.com  Fact Sheet for Healthcare Providers: SeriousBroker.it  This  test is not yet approved or cleared by the [place] FDA and has been authorized for detection and/or diagnosis of SARS-CoV-2 by FDA under an Emergency Use Authorization (EUA). This EUA will remain in effect (meaning this test can be used) for the duration of the COVID-19 declaration under Section 564(b)(1) of the Act, 21 U.S.C. section 360bbb-3(b)(1), unless the authorization is terminated or revoked.  Performed at [facility], [address] [place], [place] [address]   Surgical pcr screen     Status: None   Collection Time: 05/13/20  2:40 AM   Specimen: Nasal Mucosa; Nasal Swab  Result Value Ref Range Status   MRSA, PCR NEGATIVE NEGATIVE Final   Staphylococcus aureus NEGATIVE NEGATIVE Final    Comment: (NOTE) The Xpert SA Assay (FDA approved for NASAL specimens in patients 79 years of age and older), is one component of a comprehensive surveillance program. It is not intended to diagnose infection nor to guide or monitor treatment. Performed at [facility], [address], [place], [place] [address]          Radiology Studies: DG Chest 1 View  Result Date: 05/12/2020 CLINICAL DATA:  Larey Seat today EXAM:  CHEST  1 VIEW COMPARISON:  04/14/2011 FINDINGS: Normal heart size, mediastinal contours, and pulmonary vascularity. Linear scarring in the mid lungs bilaterally and at medial LEFT base. Lungs otherwise clear. No infiltrate, pleural effusion or pneumothorax. Atherosclerotic calcification aorta. IMPRESSION: BILATERAL lung scarring. No acute abnormalities. Aortic Atherosclerosis (ICD10-I70.0). Electronically Signed   By: Ulyses Southward M.D.   On: 05/12/2020 19:06   DG Knee 1-2 Views Right  Result Date: 05/12/2020 CLINICAL DATA:  Fall today EXAM: RIGHT KNEE - 1-2 VIEW COMPARISON:  None FINDINGS: Comminuted displaced oblique fracture of the distal RIGHT femoral metadiaphysis with mild apex posterior angulation and mild overriding. No dislocation. Degenerative  changes of RIGHT knee greatest at lateral compartment. No joint effusion. IMPRESSION: Displaced and angulated distal RIGHT femoral metadiaphyseal fracture. Osseous demineralization with degenerative changes RIGHT knee. Electronically Signed   By: Ulyses Southward M.D.   On: 05/12/2020 19:08   DG Hip Unilat W or Wo Pelvis 2-3 Views Right  Result Date: 05/12/2020 CLINICAL DATA:  Fall today EXAM: DG HIP (WITH OR WITHOUT PELVIS) 2-3V RIGHT COMPARISON:  None FINDINGS: Osseous demineralization. Hip and SI joint spaces preserved. Iliopsoas tendon calcification LEFT hip. No acute fracture, dislocation, or bone destruction. Degenerative disc and facet disease changes at visualized lower lumbar spine. IMPRESSION: No acute osseous abnormalities involving the pelvis or hips. Electronically Signed   By: Ulyses Southward M.D.   On: 05/12/2020 19:07        Scheduled Meds: . amLODipine  5 mg Oral Daily  . citalopram  10 mg Oral Daily  . cloNIDine  0.3 mg Oral BID  . ezetimibe  10 mg Oral Daily  . latanoprost  1 drop Both Eyes QHS  . oxyCODONE-acetaminophen  1 tablet Oral Once  . potassium chloride  10 mEq Oral Daily  . vitamin B-12  500 mcg Oral Daily   Continuous Infusions: .  ceFAZolin (ANCEF) IV      Assessment & Plan:   Active Problems:   Closed fracture of right distal femur (HCC)   #. Displaced distal right femur fracture Cardiology was consulted-patient had low risk for planned procedure No ischemic evaluation needed Orthopedics consulted-plan for right ORIF Bowel regimen Pain control Keep n.p.o.  #. History of HTN -Continue Norvasc and clonidine   #. History of HLD Continue Zetia  #. History of OA Hold Voltaren  #. History of depression - Continue Celexa  #. History of glaucoma - Continue Xalatan   DVT prophylaxis: SCD Code Status: Full Family Communication: none at bedside  Status is: Inpatient  Remains inpatient appropriate because:Inpatient level of care  appropriate due to severity of illness   Dispo: The patient is from: Home              Anticipated d/c is to: TBD              Anticipated d/c date is: 2 days              Patient currently is not medically stable to d/c.   Difficult to place patient No            LOS: 1 day   Time spent: 35 min with >50% on coc    Lynn Ito, MD [facility] Pager [phone]  If 7PM-7AM, please contact night-coverage 05/13/2020, 8:23 AM

## 2020-05-13 NOTE — Anesthesia Preprocedure Evaluation (Addendum)
Anesthesia Evaluation  Patient identified by MRN, date of birth, ID band Patient awake    Reviewed: Allergy & Precautions, H&P , NPO status , Patient's Chart, lab work & pertinent test results  History of Anesthesia Complications Negative for: history of anesthetic complications  Airway Mallampati: II  TM Distance: >3 FB     Dental   Pulmonary neg pulmonary ROS, neg sleep apnea, neg COPD,    breath sounds clear to auscultation       Cardiovascular hypertension, (-) angina(-) Past MI and (-) Cardiac Stents (-) dysrhythmias  Rhythm:regular Rate:Normal     Neuro/Psych Anxiety Multiple sclerosis Reports weakness of BLE and occasionally BUE Occasional numbness/weakness in all extremities   Neuromuscular disease negative psych ROS   GI/Hepatic negative GI ROS, Neg liver ROS,   Endo/Other  negative endocrine ROS  Renal/GU      Musculoskeletal  (+) Arthritis ,   Abdominal   Peds  Hematology  (+) Blood dyscrasia, anemia ,   Anesthesia Other Findings Past Medical History: 01/30/2009: ANEMIA, PERNICIOUS No date: Cataracts, bilateral 10/28/2006: DEGENERATIVE DISC DISEASE 10/28/2006: ECZEMA No date: Gait disorder No date: Glaucoma 02/16/2010: HYPERLIPIDEMIA 10/28/2006: HYPERTENSION 12/31/2010: Impaired glucose tolerance No date: MS (multiple sclerosis) (HCC) 10/28/2006: Multiple sclerosis (HCC) No date: Obesity 06/14/2009: OSTEOARTHRITIS, KNEES, BILATERAL, SEVERE No date: Sebaceous cyst     Comment:  chest  06/14/2009: VITAMIN B12 DEFICIENCY  Past Surgical History: No date: COLONOSCOPY 1958: TUBAL LIGATION No date: UPPER GASTROINTESTINAL ENDOSCOPY  BMI    Body Mass Index: 31.90 kg/m      Reproductive/Obstetrics negative OB ROS                         Anesthesia Physical Anesthesia Plan  ASA: II  Anesthesia Plan: General ETT   Post-op Pain Management:    Induction:   PONV Risk Score and  Plan: Ondansetron  Airway Management Planned:   Additional Equipment:   Intra-op Plan:   Post-operative Plan:   Informed Consent: I have reviewed the patients History and Physical, chart, labs and discussed the procedure including the risks, benefits and alternatives for the proposed anesthesia with the patient or authorized representative who has indicated his/her understanding and acceptance.     Dental Advisory Given  Plan Discussed with: Anesthesiologist, CRNA and Surgeon  Anesthesia Plan Comments: (Declines spinal due to MS)     Anesthesia Quick Evaluation

## 2020-05-13 NOTE — Anesthesia Postprocedure Evaluation (Signed)
Anesthesia Post Note  Patient: Deanna Wall  Procedure(s) Performed: OPEN REDUCTION INTERNAL FIXATION (ORIF) DISTAL FEMUR FRACTURE (Right )  Patient location during evaluation: PACU Anesthesia Type: General Level of consciousness: awake and alert Pain management: pain level controlled Vital Signs Assessment: post-procedure vital signs reviewed and stable Respiratory status: spontaneous breathing, nonlabored ventilation, respiratory function stable and patient connected to nasal cannula oxygen Cardiovascular status: blood pressure returned to baseline and stable Postop Assessment: no apparent nausea or vomiting Anesthetic complications: no   No complications documented.   Last Vitals:  Vitals:   05/13/20 2028 05/13/20 2141  BP: (!) 171/73 (!) 196/84  Pulse: 96 (!) 103  Resp: 18 17  Temp: 36.8 C 36.6 C  SpO2: 98% 97%    Last Pain:  Vitals:   05/13/20 2141  TempSrc: Oral  PainSc:                  Lenard Simmer

## 2020-05-13 NOTE — Transfer of Care (Signed)
Immediate Anesthesia Transfer of Care Note  Patient: Deanna Wall  Procedure(s) Performed: OPEN REDUCTION INTERNAL FIXATION (ORIF) DISTAL FEMUR FRACTURE (Right )  Patient Location: PACU  Anesthesia Type:General  Level of Consciousness: awake  Airway & Oxygen Therapy: Patient connected to face mask oxygen  Post-op Assessment: Post -op Vital signs reviewed and stable  Post vital signs: stable  Last Vitals:  Vitals Value Taken Time  BP 180/85 05/13/20 1900  Temp    Pulse 94 05/13/20 1903  Resp 17 05/13/20 1903  SpO2 100 % 05/13/20 1903  Vitals shown include unvalidated device data.  Last Pain:  Vitals:   05/13/20 1413  TempSrc: Temporal  PainSc: 6       Patients Stated Pain Goal: 0 (05/13/20 1413)  Complications: No complications documented.

## 2020-05-13 NOTE — ED Notes (Signed)
This nurse has contacted Maryanna Shape RN via telephone due to unable to find RN on secure chat at this time

## 2020-05-14 ENCOUNTER — Encounter: Payer: Self-pay | Admitting: Orthopedic Surgery

## 2020-05-14 ENCOUNTER — Ambulatory Visit: Payer: Medicare Other | Admitting: Internal Medicine

## 2020-05-14 DIAGNOSIS — E785 Hyperlipidemia, unspecified: Secondary | ICD-10-CM

## 2020-05-14 LAB — CBC
HCT: 25.1 % — ABNORMAL LOW (ref 36.0–46.0)
Hemoglobin: 8.3 g/dL — ABNORMAL LOW (ref 12.0–15.0)
MCH: 28.6 pg (ref 26.0–34.0)
MCHC: 33.1 g/dL (ref 30.0–36.0)
MCV: 86.6 fL (ref 80.0–100.0)
Platelets: 279 10*3/uL (ref 150–400)
RBC: 2.9 MIL/uL — ABNORMAL LOW (ref 3.87–5.11)
RDW: 15.2 % (ref 11.5–15.5)
WBC: 12.9 10*3/uL — ABNORMAL HIGH (ref 4.0–10.5)
nRBC: 0 % (ref 0.0–0.2)

## 2020-05-14 LAB — BASIC METABOLIC PANEL
Anion gap: 6 (ref 5–15)
BUN: 17 mg/dL (ref 8–23)
CO2: 24 mmol/L (ref 22–32)
Calcium: 8.5 mg/dL — ABNORMAL LOW (ref 8.9–10.3)
Chloride: 111 mmol/L (ref 98–111)
Creatinine, Ser: 1.11 mg/dL — ABNORMAL HIGH (ref 0.44–1.00)
GFR, Estimated: 48 mL/min — ABNORMAL LOW (ref >60)
Glucose, Bld: 189 mg/dL — ABNORMAL HIGH (ref 70–99)
Potassium: 4.3 mmol/L (ref 3.5–5.1)
Sodium: 141 mmol/L (ref 135–145)

## 2020-05-14 NOTE — Evaluation (Signed)
Physical Therapy Evaluation Patient Details Name: Deanna Wall MRN: [id number] DOB: 09-Apr-1931 Today's Date: 05/14/2020   History of Present Illness  Per MD:  Deanna Wall is a 87 y.o. female with a known history of HTN, HLD, MS, OA, pernicious anemia, glaucoma presents to the emergency department for evaluation of right femur fracture.  Patient was in a usual state of health until this afternoon when she sustained a mechanical fall at home, slipped and fell twisting her leg.  She was evaluated at [facility] where xrays revealed a displaced distal femur fracture     Clinical Impression  Pt received in Semi-Fowler's position and agreeable to therapy.  Pt currently has active bed rest orders that have been communicated with medical staff.  Pt currently has NWB status on the R LE.  Due to active bed rest orders, pt was only able to perform bed level exercises at this time.  Pt required modA from therapist to assist the R LE with movement during exercises listed below.  Pt notes increased pain and decreased strength in R LE.  Pt also has significant amount of IR of the L LE that prevents her from performing the bed level exercises with good technique on the L LE.  Pt notes that this mobility deficit is due to previous injury of the L LE.  Pt will be seen this PM for second session and will attempt to perform bed mobility if bed-rest restrictions are off.  Pt educated and is compliant with d/c to SNF at this time due to safety concerns.  Pt will benefit from skilled PT intervention to increase independence and safety with basic mobility in preparation for discharge to the venue listed below.       Follow Up Recommendations SNF    Equipment Recommendations  None recommended by PT    Recommendations for Other Services       Precautions / Restrictions Precautions Required Braces or Orthoses: Knee Immobilizer - Right Restrictions Weight Bearing Restrictions: Yes RLE Weight Bearing: Non weight  bearing      Mobility  Bed Mobility               General bed mobility comments: did not perform due to bed rest orders still in place at initial evaluation    Transfers                    Ambulation/Gait                Stairs            Wheelchair Mobility    Modified Rankin (Stroke Patients Only)       Balance Overall balance assessment: History of Falls                                           Pertinent Vitals/Pain Pain Assessment: Faces Faces Pain Scale: Hurts even more Pain Location: R LE Pain Descriptors / Indicators: Aching Pain Intervention(s): Limited activity within patient's tolerance;Monitored during session;Repositioned    Home Living Family/patient expects to be discharged to:: Private residence Living Arrangements: Other relatives;Children Available Help at Discharge: Family Type of Home: House Home Access: Ramped entrance     Home Layout: One level Home Equipment: Walker - 4 wheels;Bedside commode;Shower seat;Grab bars - tub/shower;Grab bars - toilet;Wheelchair - manual      Prior Function Level of Independence:  Independent with assistive device(s)         Comments: Uses walker inside the home, wheelchair when she goes out.     Hand Dominance   Dominant Hand: Right    Extremity/Trunk Assessment   Upper Extremity Assessment Upper Extremity Assessment: Generalized weakness    Lower Extremity Assessment Lower Extremity Assessment: Generalized weakness       Communication   Communication: No difficulties  Cognition Arousal/Alertness: Awake/alert Behavior During Therapy: WFL for tasks assessed/performed Overall Cognitive Status: Within Functional Limits for tasks assessed                                        General Comments General comments (skin integrity, edema, etc.): Not assessed due to bed rest orders still in place.    Exercises Total Joint  Exercises Ankle Circles/Pumps: AROM;Strengthening;Both;20 reps;Supine Quad Sets: AROM;Strengthening;Both;10 reps;Supine Gluteal Sets: AROM;Strengthening;Both;10 reps;Supine Hip ABduction/ADduction: AROM;Left;AAROM;Right;Strengthening;10 reps;Supine Straight Leg Raises: AROM;Left;AAROM;Right;Strengthening;10 reps;Supine Other Exercises Other Exercises: Pt educated on roles of therapy and physiological benefits of exercise while during hospital stay.   Assessment/Plan    PT Assessment Patient needs continued PT services  PT Problem List Decreased strength;Decreased range of motion;Decreased activity tolerance;Decreased balance;Decreased mobility;Decreased knowledge of use of DME;Decreased safety awareness;Decreased knowledge of precautions;Pain       PT Treatment Interventions DME instruction;Gait training;Functional mobility training;Therapeutic activities;Therapeutic exercise;Balance training;Neuromuscular re-education    PT Goals (Current goals can be found in the Care Plan section)  Acute Rehab PT Goals Patient Stated Goal: To get stronger. PT Goal Formulation: With patient Time For Goal Achievement: 05/28/20 Potential to Achieve Goals: Good    Frequency BID   Barriers to discharge        Co-evaluation               AM-PAC PT "6 Clicks" Mobility  Outcome Measure Help needed turning from your back to your side while in a flat bed without using bedrails?: A Lot Help needed moving from lying on your back to sitting on the side of a flat bed without using bedrails?: A Lot Help needed moving to and from a bed to a chair (including a wheelchair)?: Total Help needed standing up from a chair using your arms (e.g., wheelchair or bedside chair)?: Total Help needed to walk in hospital room?: Total Help needed climbing 3-5 steps with a railing? : Total 6 Click Score: 8    End of Session   Activity Tolerance: Patient tolerated treatment well Patient left: in bed;with call  bell/phone within reach;with bed alarm set;with SCD's reapplied   PT Visit Diagnosis: Unsteadiness on feet (R26.81);Other abnormalities of gait and mobility (R26.89);Muscle weakness (generalized) (M62.81);History of falling (Z91.81);Difficulty in walking, not elsewhere classified (R26.2);Pain Pain - Right/Left: Right Pain - part of body: Leg    Time: 3267-1245 PT Time Calculation (min) (ACUTE ONLY): 25 min   Charges:   PT Evaluation $PT Eval Low Complexity: 1 Low PT Treatments $Therapeutic Exercise: 23-37 mins        Nolon Bussing, PT, DPT 05/14/20, 12:32 PM

## 2020-05-14 NOTE — Progress Notes (Signed)
Subjective:  POD #1 s/p ORIF of right distal femur fracture.   Patient reports right thigh pain as mild.  Patient has no other complaints.  Her daughter is at the bedside.  Objective:   VITALS:   Vitals:   05/14/20 0014 05/14/20 0428 05/14/20 0833 05/14/20 1108  BP: (!) 141/77 (!) 156/74 (!) 139/59 (!) 129/52  Pulse: (!) 109 98 86 75  Resp: 20 17 16 16   Temp: 98.3 F (36.8 C) 97.9 F (36.6 C) 98 F (36.7 C) 98.3 F (36.8 C)  TempSrc:  Oral Oral Oral  SpO2: 96% 96% 91% 94%  Weight:      Height:        PHYSICAL EXAM: Right lower extremity Neurovascular intact Sensation intact distally Intact pulses distally Dorsiflexion/Plantar flexion intact Incision: dressing C/D/I No cellulitis present Compartment soft  LABS  Results for orders placed or performed during the hospital encounter of 05/12/20 (from the past 24 hour(s))  CBC     Status: Abnormal   Collection Time: 05/14/20  4:56 AM  Result Value Ref Range   WBC 12.9 (H) 4.0 - 10.5 K/uL   RBC 2.90 (L) 3.87 - 5.11 MIL/uL   Hemoglobin 8.3 (L) 12.0 - 15.0 g/dL   HCT 09.825.1 (L) 11.936.0 - 14.746.0 %   MCV 86.6 80.0 - 100.0 fL   MCH 28.6 26.0 - 34.0 pg   MCHC 33.1 30.0 - 36.0 g/dL   RDW 82.915.2 56.211.5 - 13.015.5 %   Platelets 279 150 - 400 K/uL   nRBC 0.0 0.0 - 0.2 %  Basic metabolic panel     Status: Abnormal   Collection Time: 05/14/20  4:56 AM  Result Value Ref Range   Sodium 141 135 - 145 mmol/L   Potassium 4.3 3.5 - 5.1 mmol/L   Chloride 111 98 - 111 mmol/L   CO2 24 22 - 32 mmol/L   Glucose, Bld 189 (H) 70 - 99 mg/dL   BUN 17 8 - 23 mg/dL   Creatinine, Ser 8.651.11 (H) 0.44 - 1.00 mg/dL   Calcium 8.5 (L) 8.9 - 10.3 mg/dL   GFR, Estimated 48 (L) >60 mL/min   Anion gap 6 5 - 15    DG Chest 1 View  Result Date: 05/12/2020 CLINICAL DATA:  Larey SeatFell today EXAM: CHEST  1 VIEW COMPARISON:  04/14/2011 FINDINGS: Normal heart size, mediastinal contours, and pulmonary vascularity. Linear scarring in the mid lungs bilaterally and at medial  LEFT base. Lungs otherwise clear. No infiltrate, pleural effusion or pneumothorax. Atherosclerotic calcification aorta. IMPRESSION: BILATERAL lung scarring. No acute abnormalities. Aortic Atherosclerosis (ICD10-I70.0). Electronically Signed   By: Ulyses SouthwardMark  Boles M.D.   On: 05/12/2020 19:06   DG Knee 1-2 Views Right  Result Date: 05/13/2020 CLINICAL DATA:  ORIF right femur EXAM: RIGHT KNEE - 1-2 VIEW COMPARISON:  05/12/2020.  05/13/2020 FINDINGS: Comminuted fracture distal femur above the condyle in satisfactory alignment. Lateral plate and screw fixation across the fracture. Hardware in good position. Advanced degenerative change in the lateral joint compartment of the knee. IMPRESSION: Satisfactory plate fixation of supracondylar fracture distal right femur. Electronically Signed   By: Marlan Palauharles  Clark M.D.   On: 05/13/2020 20:08   DG Knee 1-2 Views Right  Result Date: 05/12/2020 CLINICAL DATA:  Fall today EXAM: RIGHT KNEE - 1-2 VIEW COMPARISON:  None FINDINGS: Comminuted displaced oblique fracture of the distal RIGHT femoral metadiaphysis with mild apex posterior angulation and mild overriding. No dislocation. Degenerative changes of RIGHT knee greatest at lateral compartment. No  joint effusion. IMPRESSION: Displaced and angulated distal RIGHT femoral metadiaphyseal fracture. Osseous demineralization with degenerative changes RIGHT knee. Electronically Signed   By: Ulyses Southward M.D.   On: 05/12/2020 19:08   DG C-Arm 1-60 Min  Result Date: 05/13/2020 CLINICAL DATA:  Surgery, elective. Additional history provided: Right femur surgery. Provided fluoroscopy time 1 minutes, 41 seconds. EXAM: RIGHT FEMUR 2 VIEWS; DG C-ARM 1-60 MIN COMPARISON:  Radiographs of the right knee 05/12/2020. FINDINGS: Five intraoperative fluoroscopic images of the distal right femur are submitted. There has been interval ORIF of a distal right femur fracture with lateral plate and screw fixation. There is some persistent displacement of  fracture fragments, but overall alignment is near anatomic. IMPRESSION: Five intraoperative fluoroscopic images from ORIF of a distal right femoral fracture, as described. Electronically Signed   By: Jackey Loge DO   On: 05/13/2020 18:36   ECHOCARDIOGRAM COMPLETE  Result Date: 05/13/2020    ECHOCARDIOGRAM REPORT   Patient Name:   Deanna Wall Date of Exam: 05/13/2020 Medical Rec #:  [id number]       Height:       67.0 in Accession #:    [id number]      Weight:       203.7 lb Date of Birth:  1931/09/19        BSA:          2.038 m Patient Age:    88 years        BP:           167/58 mmHg Patient Gender: F               HR:           82 bpm. Exam Location:  [facility] Procedure: 2D Echo, Color Doppler and Cardiac Doppler Indications:     Preoperative evaluation  History:         Patient has prior history of Echocardiogram examinations. Risk                  Factors:Hypertension and Dyslipidemia.  Sonographer:     Humphrey Rolls RDCS (AE) Referring Phys:  [id number] Tonye Royalty Diagnosing Phys: Lorine Bears MD  Sonographer Comments: Technically difficult study due to poor echo windows. IMPRESSIONS  1. Left ventricular ejection fraction, by estimation, is 55 to 60%. The left ventricle has normal function. The left ventricle has no regional wall motion abnormalities. Left ventricular diastolic parameters are consistent with Grade I diastolic dysfunction (impaired relaxation).  2. Right ventricular systolic function is normal. The right ventricular size is normal. Tricuspid regurgitation signal is inadequate for assessing PA pressure.  3. The mitral valve is normal in structure. No evidence of mitral valve regurgitation. No evidence of mitral stenosis.  4. The aortic valve was not well visualized. Aortic valve regurgitation is not visualized. Mild to moderate aortic valve sclerosis/calcification is present, without any evidence of aortic stenosis.  5. The inferior vena cava is normal in size with greater than 50%  respiratory variability, suggesting right atrial pressure of 3 mmHg. FINDINGS  Left Ventricle: Left ventricular ejection fraction, by estimation, is 55 to 60%. The left ventricle has normal function. The left ventricle has no regional wall motion abnormalities. The left ventricular internal cavity size was normal in size. There is  no left ventricular hypertrophy. Left ventricular diastolic parameters are consistent with Grade I diastolic dysfunction (impaired relaxation). Right Ventricle: The right ventricular size is normal. No increase in right ventricular wall thickness. Right ventricular systolic  function is normal. Tricuspid regurgitation signal is inadequate for assessing PA pressure. Left Atrium: Left atrial size was normal in size. Right Atrium: Right atrial size was normal in size. Pericardium: There is no evidence of pericardial effusion. Mitral Valve: The mitral valve is normal in structure. No evidence of mitral valve regurgitation. No evidence of mitral valve stenosis. MV peak gradient, 6.1 mmHg. The mean mitral valve gradient is 2.0 mmHg. Tricuspid Valve: The tricuspid valve is normal in structure. Tricuspid valve regurgitation is not demonstrated. No evidence of tricuspid stenosis. Aortic Valve: The aortic valve was not well visualized. Aortic valve regurgitation is not visualized. Mild to moderate aortic valve sclerosis/calcification is present, without any evidence of aortic stenosis. Aortic valve mean gradient measures 4.0 mmHg.  Aortic valve peak gradient measures 9.6 mmHg. Aortic valve area, by VTI measures 2.60 cm. Pulmonic Valve: The pulmonic valve was normal in structure. Pulmonic valve regurgitation is mild. No evidence of pulmonic stenosis. Aorta: The aortic root is normal in size and structure. Venous: The inferior vena cava is normal in size with greater than 50% respiratory variability, suggesting right atrial pressure of 3 mmHg. IAS/Shunts: No atrial level shunt detected by color flow  Doppler.  LEFT VENTRICLE PLAX 2D LVIDd:         3.20 cm     Diastology LVIDs:         2.20 cm     LV e' medial:    7.40 cm/s LV PW:         0.80 cm     LV E/e' medial:  9.5 LV IVS:        0.90 cm     LV e' lateral:   7.83 cm/s LVOT diam:     2.10 cm     LV E/e' lateral: 9.0 LV SV:         53 LV SV Index:   26 LVOT Area:     3.46 cm  LV Volumes (MOD) LV vol d, MOD A4C: 71.9 ml LV vol s, MOD A4C: 28.6 ml LV SV MOD A4C:     71.9 ml LEFT ATRIUM         Index LA diam:    2.40 cm 1.18 cm/m  AORTIC VALVE                   PULMONIC VALVE AV Area (Vmax):    2.00 cm    PV Vmax:       1.62 m/s AV Area (Vmean):   2.33 cm    PV Vmean:      103.000 cm/s AV Area (VTI):     2.60 cm    PV VTI:        0.221 m AV Vmax:           155.00 cm/s PV Peak grad:  10.5 mmHg AV Vmean:          92.200 cm/s PV Mean grad:  5.0 mmHg AV VTI:            0.205 m AV Peak Grad:      9.6 mmHg AV Mean Grad:      4.0 mmHg LVOT Vmax:         89.40 cm/s LVOT Vmean:        61.900 cm/s LVOT VTI:          0.154 m LVOT/AV VTI ratio: 0.75  AORTA Ao Root diam: 2.70 cm MITRAL VALVE MV Area (PHT): 4.17 cm  SHUNTS MV Area VTI:   1.90 cm     Systemic VTI:  0.15 m MV Peak grad:  6.1 mmHg     Systemic Diam: 2.10 cm MV Mean grad:  2.0 mmHg MV Vmax:       1.23 m/s MV Vmean:      69.7 cm/s MV Decel Time: 182 msec MV E velocity: 70.30 cm/s MV A velocity: 105.00 cm/s MV E/A ratio:  0.67 Lorine Bears MD Electronically signed by Lorine Bears MD Signature Date/Time: 05/13/2020/11:25:07 AM    Final    DG Hip Unilat W or Wo Pelvis 2-3 Views Right  Result Date: 05/12/2020 CLINICAL DATA:  Fall today EXAM: DG HIP (WITH OR WITHOUT PELVIS) 2-3V RIGHT COMPARISON:  None FINDINGS: Osseous demineralization. Hip and SI joint spaces preserved. Iliopsoas tendon calcification LEFT hip. No acute fracture, dislocation, or bone destruction. Degenerative disc and facet disease changes at visualized lower lumbar spine. IMPRESSION: No acute osseous abnormalities involving the  pelvis or hips. Electronically Signed   By: Ulyses Southward M.D.   On: 05/12/2020 19:07   DG FEMUR, MIN 2 VIEWS RIGHT  Result Date: 05/13/2020 CLINICAL DATA:  Surgery, elective. Additional history provided: Right femur surgery. Provided fluoroscopy time 1 minutes, 41 seconds. EXAM: RIGHT FEMUR 2 VIEWS; DG C-ARM 1-60 MIN COMPARISON:  Radiographs of the right knee 05/12/2020. FINDINGS: Five intraoperative fluoroscopic images of the distal right femur are submitted. There has been interval ORIF of a distal right femur fracture with lateral plate and screw fixation. There is some persistent displacement of fracture fragments, but overall alignment is near anatomic. IMPRESSION: Five intraoperative fluoroscopic images from ORIF of a distal right femoral fracture, as described. Electronically Signed   By: Jackey Loge DO   On: 05/13/2020 18:36    Assessment/Plan: 1 Day Post-Op   Active Problems:   Closed fracture of right distal femur [facility])  Patient is doing well postop from an orthopedic standpoint.  Patient's dressing remains clean dry and intact.  Patient was seen and evaluated by physical therapy.  I removed the bedrest order to the patient can get out of bed with therapy.  Continue knee immobilizer while in bed but may discontinue immobilizer during physical therapy.  There are no knee motion restrictions to the patient with therapy but she is nonweightbearing on the lower extremity.  Begin Lovenox for DVT prophylaxis per pharmacy dose.  Patient will be nonweightbearing on the right lower extremity for approximately 8 to 12 weeks postop.  She will need a skilled nursing facility upon discharge.  Patient has multiple sclerosis which may complicate her postoperative physical therapy and rehab.    Juanell Fairly , MD 05/14/2020, 3:43 PM

## 2020-05-14 NOTE — TOC Initial Note (Addendum)
Transition of Care [facility]) - Initial/Assessment Note    Patient Details  Name: Deanna Wall MRN: [id number] Date of Birth: 1931-10-14  Transition of Care [facility]) CM/SW Contact:    Shelbie Ammons, RN Phone Number: 05/14/2020, 2:29 PM  Clinical Narrative:  RNCM met with patient in room. Patient is from home and normally lives independently. Patient is getting ready to work with OT when [facility] visiting but patient does verbalize that she is agreeable to SNF placement at discharge however facility that she is interested in is in [place] and very close to her daughter's house.  RNCM will completed PASSR and FL2 RNCM called and left message for patient's daughter Gerrianne Scale to follow up on which facility.       3:05: RNCM met back with patient in room and daughter Thayer Headings was present. They are requesting [facility]. RNCM sent bed request through hub and notified Kelly.         Expected Discharge Plan: Skilled Nursing Facility Barriers to Discharge: No Barriers Identified   Patient Goals and CMS Choice        Expected Discharge Plan and Services Expected Discharge Plan: [place]       Living arrangements for the past 2 months: Single Family Home                                      Prior Living Arrangements/Services Living arrangements for the past 2 months: Single Family Home Lives with:: Self Patient language and need for interpreter reviewed:: Yes Do you feel safe going back to the place where you live?: Yes      Need for Family Participation in Patient Care: Yes (Comment) Care giver support system in place?: Yes (comment)   Criminal Activity/Legal Involvement Pertinent to Current Situation/Hospitalization: No - Comment as needed  Activities of Daily Living Home Assistive Devices/Equipment: Shower chair with back,Blood pressure cuff,Scales,Walker (specify type) ADL Screening (condition at time of admission) Patient's cognitive ability  adequate to safely complete daily activities?: Yes Is the patient deaf or have difficulty hearing?: No Does the patient have difficulty seeing, even when wearing glasses/contacts?: No Does the patient have difficulty concentrating, remembering, or making decisions?: No Patient able to express need for assistance with ADLs?: Yes Does the patient have difficulty dressing or bathing?: No Independently performs ADLs?: Yes (appropriate for developmental age) Does the patient have difficulty walking or climbing stairs?: Yes Weakness of Legs: Both Weakness of Arms/Hands: None  Permission Sought/Granted                  Emotional Assessment       Orientation: : Oriented to Self,Oriented to Place,Oriented to  Time,Oriented to Situation Alcohol / Substance Use: Not Applicable Psych Involvement: No (comment)  Admission diagnosis:  Fall [W19.XXXA] Fall, initial encounter [id number]] Other closed fracture of distal end of right femur, initial encounter [facility]) [S72.491A] Patient Active Problem List   Diagnosis Date Noted  . Closed fracture of right distal femur ([place]) 05/12/2020  . Vitamin D deficiency 11/08/2019  . Arthritis of hand, degenerative 11/02/2018  . Anxiety 11/04/2017  . Bradycardia 10/21/2017  . Ingrown toenail 10/13/2017  . Cough 04/01/2017  . Nausea & vomiting 10/10/2016  . Vertigo 10/10/2016  . Constipation 05/13/2016  . Irregular heart beat 11/13/2015  . Insect bite, infected 09/11/2015  . Cerumen impaction 09/11/2014  . Allergic rhinitis 06/05/2014  . Nausea without  vomiting 06/05/2014  . Paresthesias 11/08/2012  . Left facial pain 12/07/2011  . Recurrent UTI 11/24/2011  . Dysuria 05/25/2011  . Impaired glucose tolerance 12/31/2010  . Preventative health care 07/01/2010  . Hyperlipidemia 02/16/2010  . VITAMIN B12 DEFICIENCY 06/14/2009  . Iron deficiency anemia 06/14/2009  . OSTEOARTHRITIS, KNEES, BILATERAL, SEVERE 06/14/2009  . LUMBAR RADICULOPATHY, RIGHT  06/14/2009  . Abnormality of gait 06/14/2009  . ANEMIA, PERNICIOUS 01/30/2009  . Multiple sclerosis ([place]) 10/28/2006  . Essential hypertension 10/28/2006  . ECZEMA 10/28/2006  . DEGENERATIVE DISC DISEASE 10/28/2006   PCP:  Biagio Borg, MD Pharmacy:   [facility] [address] [place] [address] Phone: [phone] Fax: [phone]     Social Determinants of Health (SDOH) Interventions    Readmission Risk Interventions No flowsheet data found.

## 2020-05-14 NOTE — NC FL2 (Signed)
[place] MEDICAID FL2 LEVEL OF CARE SCREENING TOOL     IDENTIFICATION  Patient Name: Deanna Wall Birthdate: 09/29/1931 Sex: female Admission Date (Current Location): 05/12/2020  [place] and [place] Number:  Chiropodist and Address:  [facility], [address], [place], [place] [address]      Provider Number: [id number]  Attending Physician Name and Address:  Lynn Ito, MD  Relative Name and Phone Number:  Cannon Kettle [phone]    Current Level of Care: Hospital Recommended Level of Care: Skilled Nursing Facility Prior Approval Number:    Date Approved/Denied:   PASRR Number: [id number]  Discharge Plan: SNF    Current Diagnoses: Patient Active Problem List   Diagnosis Date Noted  . Closed fracture of right distal femur ([facility]) 05/12/2020  . Vitamin D deficiency 11/08/2019  . Arthritis of hand, degenerative 11/02/2018  . Anxiety 11/04/2017  . Bradycardia 10/21/2017  . Ingrown toenail 10/13/2017  . Cough 04/01/2017  . Nausea & vomiting 10/10/2016  . Vertigo 10/10/2016  . Constipation 05/13/2016  . Irregular heart beat 11/13/2015  . Insect bite, infected 09/11/2015  . Cerumen impaction 09/11/2014  . Allergic rhinitis 06/05/2014  . Nausea without vomiting 06/05/2014  . Paresthesias 11/08/2012  . Left facial pain 12/07/2011  . Recurrent UTI 11/24/2011  . Dysuria 05/25/2011  . Impaired glucose tolerance 12/31/2010  . Preventative health care 07/01/2010  . Hyperlipidemia 02/16/2010  . VITAMIN B12 DEFICIENCY 06/14/2009  . Iron deficiency anemia 06/14/2009  . OSTEOARTHRITIS, KNEES, BILATERAL, SEVERE 06/14/2009  . LUMBAR RADICULOPATHY, RIGHT 06/14/2009  . Abnormality of gait 06/14/2009  . ANEMIA, PERNICIOUS 01/30/2009  . Multiple sclerosis ([facility]) 10/28/2006  . Essential hypertension 10/28/2006  . ECZEMA 10/28/2006  . DEGENERATIVE DISC DISEASE 10/28/2006    Orientation RESPIRATION BLADDER Height & Weight      Self,Time,Situation,Place  Normal External catheter Weight: 92.4 kg Height:  5\' 7"  (170.2 cm)  BEHAVIORAL SYMPTOMS/MOOD NEUROLOGICAL BOWEL NUTRITION STATUS      Continent Diet (Regular)  AMBULATORY STATUS COMMUNICATION OF NEEDS Skin   Extensive Assist Verbally Surgical wounds                       Personal Care Assistance Level of Assistance  Bathing,Feeding,Dressing Bathing Assistance: Maximum assistance Feeding assistance: Limited assistance Dressing Assistance: Maximum assistance     Functional Limitations Info  Sight,Hearing,Speech Sight Info: Adequate Hearing Info: Adequate Speech Info: Adequate    SPECIAL CARE FACTORS FREQUENCY  PT (By licensed PT),OT (By licensed OT)                    Contractures Contractures Info: Not present    Additional Factors Info  Code Status,Allergies Code Status Info: Full Allergies Info: Atorvastatin           Current Medications (05/14/2020):  This is the current hospital active medication list Current Facility-Administered Medications  Medication Dose Route Frequency Provider Last Rate Last Admin  . 0.45 % NaCl with KCl 20 mEq / L infusion   Intravenous Continuous 05/16/2020, MD 75 mL/hr at 05/14/20 1241 New Bag at 05/14/20 1241  . acetaminophen (TYLENOL) tablet 650 mg  650 mg Oral Q6H PRN 05/16/20, MD       Or  . acetaminophen (TYLENOL) suppository 650 mg  650 mg Rectal Q6H PRN Juanell Fairly, MD      . albuterol (PROVENTIL) (2.5 MG/3ML) 0.083% nebulizer solution 2.5 mg  2.5 mg Nebulization Q6H PRN Juanell Fairly, MD      .  alum & mag hydroxide-simeth (MAALOX/MYLANTA) 200-200-20 MG/5ML suspension 30 mL  30 mL Oral Q4H PRN Juanell Fairly, MD      . amLODipine (NORVASC) tablet 5 mg  5 mg Oral Daily Juanell Fairly, MD   5 mg at 05/14/20 7867  . aspirin chewable tablet 81 mg  81 mg Oral Daily Juanell Fairly, MD   81 mg at 05/14/20 6720  . bisacodyl (DULCOLAX) EC tablet 5 mg  5 mg Oral Daily PRN  Juanell Fairly, MD      . bisacodyl (DULCOLAX) suppository 10 mg  10 mg Rectal Daily PRN Juanell Fairly, MD      . citalopram (CELEXA) tablet 10 mg  10 mg Oral Daily Juanell Fairly, MD   10 mg at 05/14/20 9470  . cloNIDine (CATAPRES) tablet 0.3 mg  0.3 mg Oral BID Juanell Fairly, MD   0.3 mg at 05/14/20 9628  . docusate sodium (COLACE) capsule 100 mg  100 mg Oral BID Juanell Fairly, MD   100 mg at 05/14/20 0905  . dorzolamide (TRUSOPT) 2 % ophthalmic solution 1 drop  1 drop Both Eyes BID Juanell Fairly, MD   1 drop at 05/14/20 0905  . enoxaparin (LOVENOX) injection 40 mg  40 mg Subcutaneous Q24H Juanell Fairly, MD   40 mg at 05/14/20 3662  . ezetimibe (ZETIA) tablet 10 mg  10 mg Oral Daily Juanell Fairly, MD   10 mg at 05/14/20 9476  . HYDROcodone-acetaminophen (NORCO/VICODIN) 5-325 MG per tablet 1-2 tablet  1-2 tablet Oral Q4H PRN Juanell Fairly, MD      . ipratropium (ATROVENT) nebulizer solution 0.5 mg  0.5 mg Nebulization Q6H PRN Juanell Fairly, MD      . latanoprost (XALATAN) 0.005 % ophthalmic solution 1 drop  1 drop Both Eyes QHS Juanell Fairly, MD   1 drop at 05/13/20 2204  . magnesium citrate solution 1 Bottle  1 Bottle Oral Once PRN Juanell Fairly, MD      . methocarbamol (ROBAXIN) tablet 500 mg  500 mg Oral Q6H PRN Juanell Fairly, MD       Or  . methocarbamol (ROBAXIN) 500 mg in dextrose 5 % 50 mL IVPB  500 mg Intravenous Q6H PRN Juanell Fairly, MD      . morphine 2 MG/ML injection 0.5-1 mg  0.5-1 mg Intravenous Q2H PRN Juanell Fairly, MD      . nitrofurantoin (MACRODANTIN) capsule 50 mg  50 mg Oral Daily Juanell Fairly, MD   50 mg at 05/14/20 5465  . ondansetron (ZOFRAN) tablet 4 mg  4 mg Oral Q6H PRN Juanell Fairly, MD       Or  . ondansetron [facility]) injection 4 mg  4 mg Intravenous Q6H PRN Juanell Fairly, MD      . polyethylene glycol (MIRALAX / GLYCOLAX) packet 17 g  17 g Oral Daily PRN Juanell Fairly, MD      . polyvinyl alcohol (LIQUIFILM  TEARS) 1.4 % ophthalmic solution 1 drop  1 drop Both Eyes PRN Juanell Fairly, MD      . senna [facility]) tablet 8.6 mg  1 tablet Oral BID Juanell Fairly, MD   8.6 mg at 05/14/20 0354  . senna-docusate (Senokot-S) tablet 1 tablet  1 tablet Oral QHS PRN Juanell Fairly, MD      . traMADol Jane[facility] Sark) tablet 50 mg  50 mg Oral Q6H Juanell Fairly, MD   50 mg at 05/14/20 1208  . traZODone (DESYREL) tablet 25 mg  25 mg Oral QHS PRN Juanell Fairly, MD      .  vitamin B-12 (CYANOCOBALAMIN) tablet 500 mcg  500 mcg Oral Daily Juanell Fairly, MD   500 mcg at 05/14/20 5732     Discharge Medications: Please see discharge summary for a list of discharge medications.  Relevant Imaging Results:  Relevant Lab Results:   Additional Information SS# [id number]  Trenton Founds, RN

## 2020-05-14 NOTE — Progress Notes (Signed)
Physical Therapy Treatment Patient Details Name: Deanna Wall MRN: [id number] DOB: [birth year] Today's Date: 05/14/2020    History of Present Illness Per MD:  Marelin Tat is a 87 y.o. female with a known history of HTN, HLD, MS, OA, pernicious anemia, glaucoma presents to the emergency department for evaluation of right femur fracture.  Patient was in a usual state of health until this afternoon when she sustained a mechanical fall at home, slipped and fell twisting her leg.  She was evaluated at [facility] where xrays revealed a displaced distal femur fracture    PT Comments    Pt received in Semi-Fowler's position and agreeable to therapy.  Daughter currently in room with pt.  Pt was able to perform bed level exercises with assistance from therapist as indicated below.  Pt then performed transfer with maxA from therapist for LE's in order to come to seated position at EOB.  Pt attempted to stand x2, with maxA from therapist.  Pt was able to lift buttock off the bed, however was unable to fully come upright into standing position.  Pt has good technique with proper hand placement, but does not have the strength in the L LE to support her fully body weight.  Pt was assisted with maxA back into the bed and was given call bell before leaving room.  All needs met prior to leaving.  Current discharge plans to SNF remain appropriate at this time.  Pt will continue to benefit from skilled therapy in order to address deficits listed below.     Follow Up Recommendations  SNF     Equipment Recommendations  None recommended by PT    Recommendations for Other Services       Precautions / Restrictions Precautions Precautions: Fall Required Braces or Orthoses: Knee Immobilizer - Right Knee Immobilizer - Right: On at all times Restrictions Weight Bearing Restrictions: Yes RLE Weight Bearing: Non weight bearing    Mobility  Bed Mobility Overal bed mobility: Needs Assistance Bed Mobility:  Rolling;Supine to Sit;Sit to Supine Rolling: Min assist   Supine to sit: Max assist Sit to supine: Max assist        Transfers Overall transfer level: Needs assistance Equipment used: Rolling walker (2 wheeled) Transfers: Sit to/from Stand Sit to Stand: Max assist         General transfer comment: Pt attempted to stand with use of maxA +1, but unable to fully come upright.  Pt will require +2 at next session.  Ambulation/Gait                 Stairs             Wheelchair Mobility    Modified Rankin (Stroke Patients Only)       Balance Overall balance assessment: History of Falls                                          Cognition Arousal/Alertness: Awake/alert Behavior During Therapy: WFL for tasks assessed/performed Overall Cognitive Status: Within Functional Limits for tasks assessed                                        Exercises Total Joint Exercises Ankle Circles/Pumps: AROM;Strengthening;Both;20 reps;Supine Quad Sets: AROM;Strengthening;Both;10 reps;Supine Gluteal Sets: AROM;Strengthening;Both;10 reps;Supine Hip ABduction/ADduction: AROM;Left;AAROM;Right;Strengthening;10 reps;Supine  Straight Leg Raises: AROM;Left;AAROM;Right;Strengthening;10 reps;Supine Other Exercises Other Exercises: Pt educ re: role of OT, importance of EOB and OOB activity, D/C planning.    General Comments        Pertinent Vitals/Pain Pain Assessment: Faces Faces Pain Scale: Hurts even more Pain Location: R LE, increasing with movement Pain Descriptors / Indicators: Grimacing;Aching;Crying;Guarding Pain Intervention(s): Limited activity within patient's tolerance;Monitored during session;Premedicated before session;Repositioned    Home Living Family/patient expects to be discharged to:: Private residence Living Arrangements: Other relatives;Children Available Help at Discharge: Family Type of Home: House Home Access: Ramped  entrance   Home Layout: One level Home Equipment: [place] - 4 wheels;Bedside commode;Shower seat;Grab bars - tub/shower;Grab bars - toilet;Wheelchair - manual      Prior Function Level of Independence: Independent with assistive device(s)      Comments: Uses walker inside the home, wheelchair when she goes out.   PT Goals (current goals can now be found in the care plan section) Acute Rehab PT Goals Patient Stated Goal: To get stronger. Time For Goal Achievement: 05/28/20 Potential to Achieve Goals: Good Progress towards PT goals: Progressing toward goals    Frequency    BID      PT Plan Current plan remains appropriate    Co-evaluation              AM-PAC PT "6 Clicks" Mobility   Outcome Measure  Help needed turning from your back to your side while in a flat bed without using bedrails?: A Lot Help needed moving from lying on your back to sitting on the side of a flat bed without using bedrails?: A Lot Help needed moving to and from a bed to a chair (including a wheelchair)?: Total Help needed standing up from a chair using your arms (e.g., wheelchair or bedside chair)?: Total Help needed to walk in hospital room?: Total Help needed climbing 3-5 steps with a railing? : Total 6 Click Score: 8    End of Session Equipment Utilized During Treatment: Gait belt Activity Tolerance: Patient tolerated treatment well;Patient limited by pain Patient left: in bed;with call bell/phone within reach;with bed alarm set;with SCD's reapplied;with family/visitor present   PT Visit Diagnosis: Unsteadiness on feet (R26.81);Other abnormalities of gait and mobility (R26.89);Muscle weakness (generalized) (M62.81);History of falling (Z91.81);Difficulty in walking, not elsewhere classified (R26.2);Pain Pain - Right/Left: Right Pain - part of body: Leg     Time: 1638-4665 PT Time Calculation (min) (ACUTE ONLY): 21 min  Charges:  $Therapeutic Exercise: 8-22 mins                      Gwenlyn Saran, PT, DPT 05/14/20, 4:35 PM \

## 2020-05-14 NOTE — Progress Notes (Signed)
PROGRESS NOTE    Deanna Wall  OZH:[id number] DOB: 12/18/1931 DOA: 05/12/2020 PCP: Corwin LevinsJohn, James W, MD    Brief Narrative:  Deanna Wall is a 87 y.o. female with a known history of HTN, HLD, MS, OA, pernicious anemia, glaucoma presents to the emergency department for evaluation of right femur fracture.  Patient was in a usual state of health until this afternoon when she sustained a mechanical fall at home, slipped and fell twisting her leg.  She was evaluated at [facility] where xrays revealed a displaced distal femur fracture.s/p repair by Dr. Uvaldo BristleKrasinkski on 2/21.  2/21-cardiology cleared pt for surgery.  Patient overall at low risk from cardiac standpoint, no need for ischemic cardiac evaluation. 2/22-no overnight issues. Pain controlled. PT rec. SNF  Consultants:   Cardiology, Ortho  Procedures:   Antimicrobials:       Subjective: Pain controlled. No complaints.  Objective: Vitals:   05/13/20 2141 05/13/20 2312 05/14/20 0014 05/14/20 0428  BP: (!) 196/84 (!) 159/81 (!) 141/77 (!) 156/74  Pulse: (!) 103 (!) 105 (!) 109 98  Resp: 17 18 20 17   Temp: 97.8 F (36.6 C) (!) 97.4 F (36.3 C) 98.3 F (36.8 C) 97.9 F (36.6 C)  TempSrc: Oral Oral  Oral  SpO2: 97% 94% 96% 96%  Weight:      Height:        Intake/Output Summary (Last 24 hours) at 05/14/2020 0821 Last data filed at 05/14/2020 [id number] Gross per 24 hour  Intake 1752.19 ml  Output 900 ml  Net 852.19 ml   Filed Weights   05/12/20 1759 05/13/20 0136 05/13/20 1413  Weight: 113 kg 92.4 kg 92.4 kg    Examination: Nad, calm cta no w/r/r Regular s1/s2 no gallop Soft benign +bs No edema b/l aaxoxo3 grossly intact      Data Reviewed: I have personally reviewed following labs and imaging studies  CBC: Recent Labs  Lab 05/12/20 1803 05/13/20 0344 05/14/20 0456  WBC 19.0* 8.3 12.9*  NEUTROABS 15.2*  --   --   HGB 11.0* 9.9* 8.3*  HCT 33.8* 30.6* 25.1*  MCV 86.9 86.9 86.6  PLT 360 337 279   Basic  Metabolic Panel: Recent Labs  Lab 05/12/20 1803 05/13/20 0344 05/14/20 0456  NA 142 141 141  K 4.0 4.1 4.3  CL 109 109 111  CO2 24 26 24   GLUCOSE 139* 135* 189*  BUN 13 14 17   CREATININE 0.86 0.95 1.11*  CALCIUM 8.8* 8.7* 8.5*   GFR: Estimated Creatinine Clearance: 42.4 mL/min (A) (by C-G formula based on SCr of 1.11 mg/dL (H)). Liver Function Tests: Recent Labs  Lab 05/12/20 1803 05/13/20 0344  AST 20 19  ALT 16 14  ALKPHOS 85 79  BILITOT 1.0 1.3*  PROT 7.6 6.9  ALBUMIN 3.7 3.4*   No results for input(s): LIPASE, AMYLASE in the last 168 hours. No results for input(s): AMMONIA in the last 168 hours. Coagulation Profile: Recent Labs  Lab 05/13/20 0025  INR 1.2   Cardiac Enzymes: No results for input(s): CKTOTAL, CKMB, CKMBINDEX, TROPONINI in the last 168 hours. BNP (last 3 results) No results for input(s): PROBNP in the last 8760 hours. HbA1C: No results for input(s): HGBA1C in the last 72 hours. CBG: No results for input(s): GLUCAP in the last 168 hours. Lipid Profile: No results for input(s): CHOL, HDL, LDLCALC, TRIG, CHOLHDL, LDLDIRECT in the last 72 hours. Thyroid Function Tests: No results for input(s): TSH, T4TOTAL, FREET4, T3FREE, THYROIDAB in the last  72 hours. Anemia Panel: No results for input(s): VITAMINB12, FOLATE, FERRITIN, TIBC, IRON, RETICCTPCT in the last 72 hours. Sepsis Labs: No results for input(s): PROCALCITON, LATICACIDVEN in the last 168 hours.  Recent Results (from the past 240 hour(s))  Resp Panel by RT-PCR (Flu A&B, Covid) Nasopharyngeal Swab     Status: None   Collection Time: 05/12/20  6:05 PM   Specimen: Nasopharyngeal Swab; Nasopharyngeal(NP) swabs in vial transport medium  Result Value Ref Range Status   SARS Coronavirus 2 by RT PCR NEGATIVE NEGATIVE Final    Comment: (NOTE) SARS-CoV-2 target nucleic acids are NOT DETECTED.  The SARS-CoV-2 RNA is generally detectable in upper respiratory specimens during the acute phase of  infection. The lowest concentration of SARS-CoV-2 viral copies this assay can detect is 138 copies/mL. A negative result does not preclude SARS-Cov-2 infection and should not be used as the sole basis for treatment or other patient management decisions. A negative result may occur with  improper specimen collection/handling, submission of specimen other than nasopharyngeal swab, presence of viral mutation(s) within the areas targeted by this assay, and inadequate number of viral copies(<138 copies/mL). A negative result must be combined with clinical observations, patient history, and epidemiological information. The expected result is Negative.  Fact Sheet for Patients:  BloggerCourse.com  Fact Sheet for Healthcare Providers:  SeriousBroker.it  This test is no t yet approved or cleared by the [place] FDA and  has been authorized for detection and/or diagnosis of SARS-CoV-2 by FDA under an Emergency Use Authorization (EUA). This EUA will remain  in effect (meaning this test can be used) for the duration of the COVID-19 declaration under Section 564(b)(1) of the Act, 21 U.S.C.section 360bbb-3(b)(1), unless the authorization is terminated  or revoked sooner.       Influenza A by PCR NEGATIVE NEGATIVE Final   Influenza B by PCR NEGATIVE NEGATIVE Final    Comment: (NOTE) The Xpert Xpress SARS-CoV-2/FLU/RSV plus assay is intended as an aid in the diagnosis of influenza from Nasopharyngeal swab specimens and should not be used as a sole basis for treatment. Nasal washings and aspirates are unacceptable for Xpert Xpress SARS-CoV-2/FLU/RSV testing.  Fact Sheet for Patients: BloggerCourse.com  Fact Sheet for Healthcare Providers: SeriousBroker.it  This test is not yet approved or cleared by the [place] FDA and has been authorized for detection and/or diagnosis of SARS-CoV-2  by FDA under an Emergency Use Authorization (EUA). This EUA will remain in effect (meaning this test can be used) for the duration of the COVID-19 declaration under Section 564(b)(1) of the Act, 21 U.S.C. section 360bbb-3(b)(1), unless the authorization is terminated or revoked.  Performed at [facility], [address] [place], [place] [address]   Surgical pcr screen     Status: None   Collection Time: 05/13/20  2:40 AM   Specimen: Nasal Mucosa; Nasal Swab  Result Value Ref Range Status   MRSA, PCR NEGATIVE NEGATIVE Final   Staphylococcus aureus NEGATIVE NEGATIVE Final    Comment: (NOTE) The Xpert SA Assay (FDA approved for NASAL specimens in patients 22 years of age and older), is one component of a comprehensive surveillance program. It is not intended to diagnose infection nor to guide or monitor treatment. Performed at [facility], [address], [place], [place] [address]          Radiology Studies: DG Chest 1 View  Result Date: 05/12/2020 CLINICAL DATA:  Larey Seat today EXAM: CHEST  1 VIEW COMPARISON:  04/14/2011 FINDINGS: Normal  heart size, mediastinal contours, and pulmonary vascularity. Linear scarring in the mid lungs bilaterally and at medial LEFT base. Lungs otherwise clear. No infiltrate, pleural effusion or pneumothorax. Atherosclerotic calcification aorta. IMPRESSION: BILATERAL lung scarring. No acute abnormalities. Aortic Atherosclerosis (ICD10-I70.0). Electronically Signed   By: Ulyses Southward M.D.   On: 05/12/2020 19:06   DG Knee 1-2 Views Right  Result Date: 05/13/2020 CLINICAL DATA:  ORIF right femur EXAM: RIGHT KNEE - 1-2 VIEW COMPARISON:  05/12/2020.  05/13/2020 FINDINGS: Comminuted fracture distal femur above the condyle in satisfactory alignment. Lateral plate and screw fixation across the fracture. Hardware in good position. Advanced degenerative change in the lateral joint compartment of the knee. IMPRESSION: Satisfactory plate fixation  of supracondylar fracture distal right femur. Electronically Signed   By: Marlan Palau M.D.   On: 05/13/2020 20:08   DG Knee 1-2 Views Right  Result Date: 05/12/2020 CLINICAL DATA:  Fall today EXAM: RIGHT KNEE - 1-2 VIEW COMPARISON:  None FINDINGS: Comminuted displaced oblique fracture of the distal RIGHT femoral metadiaphysis with mild apex posterior angulation and mild overriding. No dislocation. Degenerative changes of RIGHT knee greatest at lateral compartment. No joint effusion. IMPRESSION: Displaced and angulated distal RIGHT femoral metadiaphyseal fracture. Osseous demineralization with degenerative changes RIGHT knee. Electronically Signed   By: Ulyses Southward M.D.   On: 05/12/2020 19:08   DG C-Arm 1-60 Min  Result Date: 05/13/2020 CLINICAL DATA:  Surgery, elective. Additional history provided: Right femur surgery. Provided fluoroscopy time 1 minutes, 41 seconds. EXAM: RIGHT FEMUR 2 VIEWS; DG C-ARM 1-60 MIN COMPARISON:  Radiographs of the right knee 05/12/2020. FINDINGS: Five intraoperative fluoroscopic images of the distal right femur are submitted. There has been interval ORIF of a distal right femur fracture with lateral plate and screw fixation. There is some persistent displacement of fracture fragments, but overall alignment is near anatomic. IMPRESSION: Five intraoperative fluoroscopic images from ORIF of a distal right femoral fracture, as described. Electronically Signed   By: Jackey Loge DO   On: 05/13/2020 18:36   ECHOCARDIOGRAM COMPLETE  Result Date: 05/13/2020    ECHOCARDIOGRAM REPORT   Patient Name:   Deanna Wall Date of Exam: 05/13/2020 Medical Rec #:  [id number]       Height:       67.0 in Accession #:    [id number]      Weight:       203.7 lb Date of Birth:  Apr 18, 1931        BSA:          2.038 m Patient Age:    88 years        BP:           167/58 mmHg Patient Gender: F               HR:           82 bpm. Exam Location:  [facility] Procedure: 2D Echo, Color Doppler and Cardiac  Doppler Indications:     Preoperative evaluation  History:         Patient has prior history of Echocardiogram examinations. Risk                  Factors:Hypertension and Dyslipidemia.  Sonographer:     Humphrey Rolls RDCS (AE) Referring Phys:  [id number] Tonye Royalty Diagnosing Phys: Lorine Bears MD  Sonographer Comments: Technically difficult study due to poor echo windows. IMPRESSIONS  1. Left ventricular ejection fraction, by estimation, is 55 to 60%. The left ventricle  has normal function. The left ventricle has no regional Wall motion abnormalities. Left ventricular diastolic parameters are consistent with Grade I diastolic dysfunction (impaired relaxation).  2. Right ventricular systolic function is normal. The right ventricular size is normal. Tricuspid regurgitation signal is inadequate for assessing PA pressure.  3. The mitral valve is normal in structure. No evidence of mitral valve regurgitation. No evidence of mitral stenosis.  4. The aortic valve was not well visualized. Aortic valve regurgitation is not visualized. Mild to moderate aortic valve sclerosis/calcification is present, without any evidence of aortic stenosis.  5. The inferior vena cava is normal in size with greater than 50% respiratory variability, suggesting right atrial pressure of 3 mmHg. FINDINGS  Left Ventricle: Left ventricular ejection fraction, by estimation, is 55 to 60%. The left ventricle has normal function. The left ventricle has no regional Wall motion abnormalities. The left ventricular internal cavity size was normal in size. There is  no left ventricular hypertrophy. Left ventricular diastolic parameters are consistent with Grade I diastolic dysfunction (impaired relaxation). Right Ventricle: The right ventricular size is normal. No increase in right ventricular Wall thickness. Right ventricular systolic function is normal. Tricuspid regurgitation signal is inadequate for assessing PA pressure. Left Atrium: Left atrial  size was normal in size. Right Atrium: Right atrial size was normal in size. Pericardium: There is no evidence of pericardial effusion. Mitral Valve: The mitral valve is normal in structure. No evidence of mitral valve regurgitation. No evidence of mitral valve stenosis. MV peak gradient, 6.1 mmHg. The mean mitral valve gradient is 2.0 mmHg. Tricuspid Valve: The tricuspid valve is normal in structure. Tricuspid valve regurgitation is not demonstrated. No evidence of tricuspid stenosis. Aortic Valve: The aortic valve was not well visualized. Aortic valve regurgitation is not visualized. Mild to moderate aortic valve sclerosis/calcification is present, without any evidence of aortic stenosis. Aortic valve mean gradient measures 4.0 mmHg.  Aortic valve peak gradient measures 9.6 mmHg. Aortic valve area, by VTI measures 2.60 cm. Pulmonic Valve: The pulmonic valve was normal in structure. Pulmonic valve regurgitation is mild. No evidence of pulmonic stenosis. Aorta: The aortic root is normal in size and structure. Venous: The inferior vena cava is normal in size with greater than 50% respiratory variability, suggesting right atrial pressure of 3 mmHg. IAS/Shunts: No atrial level shunt detected by color flow Doppler.  LEFT VENTRICLE PLAX 2D LVIDd:         3.20 cm     Diastology LVIDs:         2.20 cm     LV e' medial:    7.40 cm/s LV PW:         0.80 cm     LV E/e' medial:  9.5 LV IVS:        0.90 cm     LV e' lateral:   7.83 cm/s LVOT diam:     2.10 cm     LV E/e' lateral: 9.0 LV SV:         53 LV SV Index:   26 LVOT Area:     3.46 cm  LV Volumes (MOD) LV vol d, MOD A4C: 71.9 ml LV vol s, MOD A4C: 28.6 ml LV SV MOD A4C:     71.9 ml LEFT ATRIUM         Index LA diam:    2.40 cm 1.18 cm/m  AORTIC VALVE  PULMONIC VALVE AV Area (Vmax):    2.00 cm    PV Vmax:       1.62 m/s AV Area (Vmean):   2.33 cm    PV Vmean:      103.000 cm/s AV Area (VTI):     2.60 cm    PV VTI:        0.221 m AV Vmax:            155.00 cm/s PV Peak grad:  10.5 mmHg AV Vmean:          92.200 cm/s PV Mean grad:  5.0 mmHg AV VTI:            0.205 m AV Peak Grad:      9.6 mmHg AV Mean Grad:      4.0 mmHg LVOT Vmax:         89.40 cm/s LVOT Vmean:        61.900 cm/s LVOT VTI:          0.154 m LVOT/AV VTI ratio: 0.75  AORTA Ao Root diam: 2.70 cm MITRAL VALVE MV Area (PHT): 4.17 cm     SHUNTS MV Area VTI:   1.90 cm     Systemic VTI:  0.15 m MV Peak grad:  6.1 mmHg     Systemic Diam: 2.10 cm MV Mean grad:  2.0 mmHg MV Vmax:       1.23 m/s MV Vmean:      69.7 cm/s MV Decel Time: 182 msec MV E velocity: 70.30 cm/s MV A velocity: 105.00 cm/s MV E/A ratio:  0.67 Lorine Bears MD Electronically signed by Lorine Bears MD Signature Date/Time: 05/13/2020/11:25:07 AM    Final    DG Hip Unilat W or Wo Pelvis 2-3 Views Right  Result Date: 05/12/2020 CLINICAL DATA:  Fall today EXAM: DG HIP (WITH OR WITHOUT PELVIS) 2-3V RIGHT COMPARISON:  None FINDINGS: Osseous demineralization. Hip and SI joint spaces preserved. Iliopsoas tendon calcification LEFT hip. No acute fracture, dislocation, or bone destruction. Degenerative disc and facet disease changes at visualized lower lumbar spine. IMPRESSION: No acute osseous abnormalities involving the pelvis or hips. Electronically Signed   By: Ulyses Southward M.D.   On: 05/12/2020 19:07   DG FEMUR, MIN 2 VIEWS RIGHT  Result Date: 05/13/2020 CLINICAL DATA:  Surgery, elective. Additional history provided: Right femur surgery. Provided fluoroscopy time 1 minutes, 41 seconds. EXAM: RIGHT FEMUR 2 VIEWS; DG C-ARM 1-60 MIN COMPARISON:  Radiographs of the right knee 05/12/2020. FINDINGS: Five intraoperative fluoroscopic images of the distal right femur are submitted. There has been interval ORIF of a distal right femur fracture with lateral plate and screw fixation. There is some persistent displacement of fracture fragments, but overall alignment is near anatomic. IMPRESSION: Five intraoperative fluoroscopic images from  ORIF of a distal right femoral fracture, as described. Electronically Signed   By: Jackey Loge DO   On: 05/13/2020 18:36        Scheduled Meds: . amLODipine  5 mg Oral Daily  . aspirin  81 mg Oral Daily  . citalopram  10 mg Oral Daily  . cloNIDine  0.3 mg Oral BID  . docusate sodium  100 mg Oral BID  . dorzolamide  1 drop Both Eyes BID  . enoxaparin (LOVENOX) injection  40 mg Subcutaneous Q24H  . ezetimibe  10 mg Oral Daily  . latanoprost  1 drop Both Eyes QHS  . nitrofurantoin  50 mg Oral Daily  . senna  1 tablet  Oral BID  . traMADol  50 mg Oral Q6H  . vitamin B-12  500 mcg Oral Daily   Continuous Infusions: . 0.45 % NaCl with KCl 20 mEq / L 75 mL/hr at 05/14/20 1610  . methocarbamol (ROBAXIN) IV      Assessment & Plan:   Active Problems:   Closed fracture of right distal femur (HCC)   #. Displaced distal right femur fracture Cardiology was consulted-patient had low risk for planned procedure No ischemic evaluation needed 12/22-s/p ORIF on 2/21 by Dr. Conley Canal Bowel reigmen Pain control Per Ortho There are no knee motion restrictions to the patient with therapy but she is nonweightbearing on the lower extremity Patient will be nonweightbearing on the right lower extremity for approximately 8 to 12 weeks postop Monitor h/h   #. History of HTN Controlled on norvasc and clonidine   #. History of HLD Continue Zetia  #. History of depression Continue Celexa  #. History of OA Hold Voltaren    #. History of glaucoma - Continue Xalatan   DVT prophylaxis: Lovenox Code Status: Full Family Communication: none at bedside  Status is: Inpatient  Remains inpatient appropriate because:Inpatient level of care appropriate due to severity of illness   Dispo: The patient is from: Home              Anticipated d/c is to: SNF              Anticipated d/c date is: 2 days              Patient currently is not medically stable to d/c.   Difficult to place  patient No            LOS: 2 days   Time spent: 35 min with >50% on coc    Lynn Ito, MD [facility] Pager [phone]  If 7PM-7AM, please contact night-coverage 05/14/2020, 8:21 AM

## 2020-05-14 NOTE — Evaluation (Signed)
Occupational Therapy Evaluation Patient Details Name: Deanna Wall MRN: [id number] DOB: February 06, 1932 Today's Date: 05/14/2020    History of Present Illness Per MD:  Deanna Wall is a 87 y.o. female with a known history of HTN, HLD, MS, OA, pernicious anemia, glaucoma presents to the emergency department for evaluation of right femur fracture.  Patient was in a usual state of health until this afternoon when she sustained a mechanical fall at home, slipped and fell twisting her leg.  She was evaluated at [facility] where xrays revealed a displaced distal femur fracture   Clinical Impression   Prior to seeing pt, cleared OT session with physician, given the "strict bed rest" order in the pt's chart. MD confirmed pt cleared to treat, while adhering to NWB precaution for RLE. Deanna Wall tried her best to participate in the session, but was fearful of moving her R LE. She stated that she has had previous injuries to her L LE, and has therefore in recent years relied primarily on her R LE for weight bearing. She repeatedly expressed concern that her L LE would not be strong enough to support her. She was also tearful throughout the session, frequently saying to herself, "why did I fall?" Prior to this injury, pt lived with her husband of 70 years in a one-story home with ramped entrance; they have 5 children and multiple grandchildren who live nearby and visit daily. Deanna Wall was IND in ADL and IADL, although preferred sponge-bathing to showering. Three weeks ago, Deanna Wall's husband passed away following a fall; one of her children has been staying with her since. The combination of his death + Deanna Wall's injury has left her feeling fragile, and she openly wondered if she would "have the strength to get better." She required consistent encouragement throughout the session to remain engaged. Recommend ongoing OT while pt is hospitalized, followed by STR to improve strength, endurance, balance, and ROM  and support return to PLOF.    Follow Up Recommendations  SNF    Equipment Recommendations       Recommendations for Other Services       Precautions / Restrictions Precautions Precautions: Fall Required Braces or Orthoses: Knee Immobilizer - Right Knee Immobilizer - Right: On at all times Restrictions Weight Bearing Restrictions: Yes RLE Weight Bearing: Non weight bearing      Mobility Bed Mobility Overal bed mobility: Needs Assistance Bed Mobility: Rolling;Supine to Sit;Sit to Supine Rolling: Min assist   Supine to sit: Max assist Sit to supine: Max assist   General bed mobility comments: did not perform due to bed rest orders still in place at initial evaluation    Transfers                      Balance Overall balance assessment: History of Falls                                         ADL either performed or assessed with clinical judgement   ADL Overall ADL's : Needs assistance/impaired     Grooming: Minimal assistance   Upper Body Bathing: Minimal assistance       Upper Body Dressing : Moderate assistance                     General ADL Comments: Pt guarding R LE, resisting any movement  Vision Patient Visual Report: No change from baseline       Perception     Praxis      Pertinent Vitals/Pain Pain Assessment: Faces Faces Pain Scale: Hurts a little bit Pain Location: R LE, increasing with movement Pain Descriptors / Indicators: Grimacing;Aching;Crying;Guarding Pain Intervention(s): Limited activity within patient's tolerance;Monitored during session     Hand Dominance Right   Extremity/Trunk Assessment Upper Extremity Assessment Upper Extremity Assessment: Overall WFL for tasks assessed   Lower Extremity Assessment Lower Extremity Assessment: Generalized weakness;RLE deficits/detail RLE: Unable to fully assess due to immobilization;Unable to fully assess due to pain       Communication  Communication Communication: No difficulties   Cognition Arousal/Alertness: Awake/alert Behavior During Therapy: WFL for tasks assessed/performed Overall Cognitive Status: Within Functional Limits for tasks assessed                                     General Comments  Not assessed due to bed rest orders still in place.    Exercises Total Joint Exercises Ankle Circles/Pumps: AROM;Strengthening;Both;20 reps;Supine Quad Sets: AROM;Strengthening;Both;10 reps;Supine Gluteal Sets: AROM;Strengthening;Both;10 reps;Supine Hip ABduction/ADduction: AROM;Left;AAROM;Right;Strengthening;10 reps;Supine Straight Leg Raises: AROM;Left;AAROM;Right;Strengthening;10 reps;Supine Other Exercises Other Exercises: Pt educated on roles of therapy and physiological benefits of exercise while during hospital stay. Other Exercises: Pt educ re: role of OT, importance of EOB and OOB activity, D/C planning.   Shoulder Instructions      Home Living Family/patient expects to be discharged to:: Private residence Living Arrangements: Other relatives;Children Available Help at Discharge: Family Type of Home: House Home Access: Ramped entrance     Home Layout: One level     Bathroom Shower/Tub: Chief Strategy Officer: Standard Bathroom Accessibility: Yes   Home Equipment: Environmental consultant - 4 wheels;Bedside commode;Shower seat;Grab bars - tub/shower;Grab bars - toilet;Wheelchair - manual          Prior Functioning/Environment Level of Independence: Independent with assistive device(s)        Comments: Uses walker inside the home, wheelchair when she goes out.        OT Problem List: Decreased strength;Decreased range of motion;Decreased activity tolerance;Impaired balance (sitting and/or standing);Decreased knowledge of precautions      OT Treatment/Interventions: DME and/or AE instruction;Balance training;Patient/family education;Therapeutic exercise;Self-care/ADL training     OT Goals(Current goals can be found in the care plan section) Acute Rehab OT Goals Patient Stated Goal: To get stronger. OT Goal Formulation: With patient Time For Goal Achievement: 05/28/20 Potential to Achieve Goals: Good ADL Goals Pt Will Perform Lower Body Bathing: with min assist (using AE as necessary while adhering to precautions) Pt Will Perform Lower Body Dressing: with min assist (with AE as needed while adhering to precautions) Pt Will Perform Toileting - Clothing Manipulation and hygiene: with min assist (with AE as needed while adhering to precautions)  OT Frequency: Min 1X/week   Barriers to D/C: Decreased caregiver support  husband recently deceased       Co-evaluation              AM-PAC OT "6 Clicks" Daily Activity     Outcome Measure Help from another person eating meals?: None Help from another person taking care of personal grooming?: A Little Help from another person toileting, which includes using toliet, bedpan, or urinal?: A Lot Help from another person bathing (including washing, rinsing, drying)?: A Lot Help from another person to put on and taking  off regular upper body clothing?: A Little Help from another person to put on and taking off regular lower body clothing?: A Lot 6 Click Score: 16   End of Session Equipment Utilized During Treatment: Rolling walker;Right knee immobilizer  Activity Tolerance: Patient tolerated treatment well;Patient limited by pain Patient left: in bed;with family/visitor present;with call bell/phone within reach;with bed alarm set;with nursing/sitter in room  OT Visit Diagnosis: Unsteadiness on feet (R26.81);Other abnormalities of gait and mobility (R26.89);Muscle weakness (generalized) (M62.81)                Time: 0981-1914 OT Time Calculation (min): 24 min Charges:  OT General Charges $OT Visit: 1 Visit OT Evaluation $OT Eval Moderate Complexity: 1 Mod OT Treatments $Self Care/Home Management : 23-37  mins  Latina Craver, PhD, MS, OTR/L ascom [phone] 05/14/20, 4:00 PM

## 2020-05-15 ENCOUNTER — Encounter: Payer: Self-pay | Admitting: Orthopedic Surgery

## 2020-05-15 DIAGNOSIS — S72401D Unspecified fracture of lower end of right femur, subsequent encounter for closed fracture with routine healing: Secondary | ICD-10-CM

## 2020-05-15 LAB — BASIC METABOLIC PANEL WITH GFR
Anion gap: 4 — ABNORMAL LOW (ref 5–15)
BUN: 18 mg/dL (ref 8–23)
CO2: 27 mmol/L (ref 22–32)
Calcium: 8.2 mg/dL — ABNORMAL LOW (ref 8.9–10.3)
Chloride: 108 mmol/L (ref 98–111)
Creatinine, Ser: 0.96 mg/dL (ref 0.44–1.00)
GFR, Estimated: 58 mL/min — ABNORMAL LOW
Glucose, Bld: 121 mg/dL — ABNORMAL HIGH (ref 70–99)
Potassium: 4.6 mmol/L (ref 3.5–5.1)
Sodium: 139 mmol/L (ref 135–145)

## 2020-05-15 LAB — CBC
HCT: 21.1 % — ABNORMAL LOW (ref 36.0–46.0)
Hemoglobin: 7.1 g/dL — ABNORMAL LOW (ref 12.0–15.0)
MCH: 29.1 pg (ref 26.0–34.0)
MCHC: 33.6 g/dL (ref 30.0–36.0)
MCV: 86.5 fL (ref 80.0–100.0)
Platelets: 249 K/uL (ref 150–400)
RBC: 2.44 MIL/uL — ABNORMAL LOW (ref 3.87–5.11)
RDW: 15.3 % (ref 11.5–15.5)
WBC: 13.5 K/uL — ABNORMAL HIGH (ref 4.0–10.5)
nRBC: 0 % (ref 0.0–0.2)

## 2020-05-15 LAB — HEMOGLOBIN: Hemoglobin: 7.1 g/dL — ABNORMAL LOW (ref 12.0–15.0)

## 2020-05-15 LAB — ABO/RH: ABO/RH(D): A POS

## 2020-05-15 LAB — PREPARE RBC (CROSSMATCH)

## 2020-05-15 MED ORDER — ACETAMINOPHEN 325 MG PO TABS
650.0000 mg | ORAL_TABLET | Freq: Once | ORAL | Status: AC
  Administered 2020-05-15: 650 mg via ORAL
  Filled 2020-05-15: qty 2

## 2020-05-15 MED ORDER — DIPHENHYDRAMINE HCL 25 MG PO CAPS
25.0000 mg | ORAL_CAPSULE | Freq: Once | ORAL | Status: AC
  Administered 2020-05-15: 25 mg via ORAL
  Filled 2020-05-15: qty 1

## 2020-05-15 MED ORDER — SODIUM CHLORIDE 0.9% IV SOLUTION: Freq: Once | INTRAVENOUS | Status: AC

## 2020-05-15 NOTE — Plan of Care (Signed)

## 2020-05-15 NOTE — Care Management Important Message (Signed)
Important Message  Patient Details  Name: Deanna Wall MRN: [id number] Date of Birth: [birth year]   Medicare Important Message Given:  Yes     Verita Schneiders Shontell Prosser 05/15/2020, 12:38 PM

## 2020-05-15 NOTE — TOC Progression Note (Signed)
Transition of Care [facility]) - Progression Note    Patient Details  Name: ISRAA CABAN MRN: [id number] Date of Birth: [birth year]  [facility] CM/SW Contact  Trenton Founds, RN Phone Number: 05/15/2020, 9:33 AM  Clinical Narrative:   Patient has bed at [facility], [facility] started insurance authorization through [employer] portal. Ref # [id number]    Expected Discharge Plan: Skilled Nursing Facility Barriers to Discharge: No Barriers Identified  Expected Discharge Plan and Services Expected Discharge Plan: Skilled Nursing Facility       Living arrangements for the past 2 months: Single Family Home                                       Social Determinants of Health (SDOH) Interventions    Readmission Risk Interventions No flowsheet data found.

## 2020-05-15 NOTE — Progress Notes (Signed)
Occupational Therapy Treatment Patient Details Name: Deanna Wall MRN: [id number] DOB: Sep 15, 1931 Today's Date: 05/15/2020    History of present illness Per MD:  Deanna Wall is a 87 y.o. female with a known history of HTN, HLD, MS, OA, pernicious anemia, glaucoma presents to the emergency department for evaluation of right femur fracture.  Patient was in a usual state of health until this afternoon when she sustained a mechanical fall at home, slipped and fell twisting her leg.  She was evaluated at [facility] where xrays revealed a displaced distal femur fracture   OT comments  Deanna Wall presents today with generalized weakness and fatigue. She continues to report that her RLE pain is minimal when she is lying still in bed, but that it shoots up to 5-6/10 with movement. She engaged in bed-level therex and attempted to transition supine<sit, before reporting that she was too tired to continue and asked to return to supine position. Therapist provided education re: AE, LB dressing, importance of OOB activity, fall prevention strategies. Daughter present in room throughout session and reports that she and her siblings will be with their mom on a daily basis when pt goes to STR and will make sure she participates in therapy.    Follow Up Recommendations  SNF    Equipment Recommendations       Recommendations for Other Services      Precautions / Restrictions Precautions Precautions: Fall Required Braces or Orthoses: Knee Immobilizer - Right Knee Immobilizer - Right: On at all times;Other (comment) Restrictions Weight Bearing Restrictions: Yes RLE Weight Bearing: Non weight bearing       Mobility Bed Mobility Overal bed mobility: Needs Assistance Bed Mobility: Supine to Sit;Sit to Supine Rolling: Min assist;Mod assist   Supine to sit: HOB elevated;Mod assist Sit to supine: Max assist   General bed mobility comments: Pt required mod assist to exit R side of bed. attempted to  stand one time however pt required total assist to return to supine 2/2 to sliding off EOB after attempt. Unsafe to stand again 2/2 to strength, endurance, and unable to maintain proper wt bearing    Transfers Overall transfer level: Needs assistance Equipment used: Rolling walker (2 wheeled) Transfers: Sit to/from Stand Sit to Stand: From elevated surface;+2 safety/equipment;Max assist         General transfer comment: Did not attempt standing, due to safety concerns and pt fatigue    Balance Overall balance assessment: History of Falls                                         ADL either performed or assessed with clinical judgement   ADL Overall ADL's : Needs assistance/impaired                                             Vision Patient Visual Report: No change from baseline     Perception     Praxis      Cognition Arousal/Alertness: Awake/alert Behavior During Therapy: WFL for tasks assessed/performed Overall Cognitive Status: Within Functional Limits for tasks assessed                                 General Comments:  Pt is A and O x 4 and very cooperative and pleasant        Exercises Other Exercises Other Exercises: Educ re: AE for LB dressing, bathing; falls prevention strategies   Shoulder Instructions       General Comments issued ther ex handout with exercises that can be pereformed with knee immobilizer donned.    Pertinent Vitals/ Pain       Pain Assessment: 0-10 Pain Score: 3  Pain Location: R LE, increasing with movement Pain Descriptors / Indicators: Grimacing;Guarding;Discomfort;Sharp Pain Intervention(s): Limited activity within patient's tolerance;Monitored during session;Repositioned;Relaxation  Home Living                                          Prior Functioning/Environment              Frequency  Min 1X/week        Progress Toward Goals  OT  Goals(current goals can now be found in the care plan section)  Progress towards OT goals: Progressing toward goals  Acute Rehab OT Goals Patient Stated Goal: go to rehab close to home OT Goal Formulation: With patient Time For Goal Achievement: 05/28/20 Potential to Achieve Goals: Good  Plan Discharge plan remains appropriate;Frequency remains appropriate    Co-evaluation                 AM-PAC OT "6 Clicks" Daily Activity     Outcome Measure   Help from another person eating meals?: None Help from another person taking care of personal grooming?: A Little Help from another person toileting, which includes using toliet, bedpan, or urinal?: A Lot Help from another person bathing (including washing, rinsing, drying)?: A Lot Help from another person to put on and taking off regular upper body clothing?: A Little Help from another person to put on and taking off regular lower body clothing?: A Lot 6 Click Score: 16    End of Session Equipment Utilized During Treatment: Right knee immobilizer  OT Visit Diagnosis: Unsteadiness on feet (R26.81);Other abnormalities of gait and mobility (R26.89);Muscle weakness (generalized) (M62.81)   Activity Tolerance Patient tolerated treatment well;Patient limited by pain   Patient Left in bed;with family/visitor present;with call bell/phone within reach;with bed alarm set   Nurse Communication          Time: 2952-8413 OT Time Calculation (min): 11 min  Charges: OT General Charges $OT Visit: 1 Visit OT Treatments $Self Care/Home Management : 8-22 mins   Deanna Craver, PhD, MS, OTR/L ascom [phone] 05/15/20, 3:54 PM

## 2020-05-15 NOTE — Progress Notes (Signed)
Physical Therapy Treatment Patient Details Name: Deanna Wall MRN: [id number] DOB: [birth year] Today's Date: 05/15/2020    History of Present Illness Per MD:  Deanna Wall is a 87 y.o. female with a known history of HTN, HLD, MS, OA, pernicious anemia, glaucoma presents to the emergency department for evaluation of right femur fracture.  Patient was in a usual state of health until this afternoon when she sustained a mechanical fall at home, slipped and fell twisting her leg.  She was evaluated at [facility] where xrays revealed a displaced distal femur fracture    PT Comments    Pt was in semifowlers positioned upon arriving with knee immobilizer loosely donned. Discussed need to maintain knees straight. Pt tends to have externally rotated RLE. Discussed and repositioned several times throughout session. Did perform light AROM to BLEs. Pt performed there ex in bed prior to sitting up EOB with max assist of one required. Elected not to perform transfers 2/2 to pain and pt request. Will return this afternoon to get pt OOB if not DC to SNF today. She will greatly benefit from SNF at DC to address deficits and assist pt with returning to PLOF.     Follow Up Recommendations  SNF     Equipment Recommendations  None recommended by PT    Recommendations for Other Services       Precautions / Restrictions Precautions Precautions: Fall Required Braces or Orthoses: Knee Immobilizer - Right Knee Immobilizer - Right: On at all times;Other (comment) (except with PT/OT) Restrictions Weight Bearing Restrictions: Yes RLE Weight Bearing: Non weight bearing    Mobility  Bed Mobility Overal bed mobility: Needs Assistance Bed Mobility: Rolling;Supine to Sit;Sit to Supine Rolling: Min assist;Mod assist   Supine to sit: Max assist Sit to supine: Max assist   General bed mobility comments: Pt was long sitting in bed upon arriving. R leg externally rotated with knee immobilizer donned. discussed  better positioning to promote healing/knee extension.    Transfers    General transfer comment: will attempt OOB activity in PM session if pt is not DC to SNF.  Ambulation/Gait    General Gait Details: unable to safely perform with NWB restrictions on RLE         Cognition Arousal/Alertness: Awake/alert Behavior During Therapy: [facility] for tasks assessed/performed Overall Cognitive Status: Within Functional Limits for tasks assessed        General Comments: Pt is A and O x 4 and very cooperative and pleasant      Exercises Total Joint Exercises Ankle Circles/Pumps: AROM;Strengthening;Both;20 reps;Supine Quad Sets: AROM;Strengthening;Both;10 reps;Supine Gluteal Sets: AROM;Strengthening;Both;10 reps;Supine Hip ABduction/ADduction: AROM;Left;AAROM;Right;Strengthening;10 reps;Supine Straight Leg Raises: AROM;Left;AAROM;Right;Strengthening;10 reps;Supine        Pertinent Vitals/Pain Pain Assessment: 0-10 Pain Score: 5  Pain Location: R LE, increasing with movement Pain Descriptors / Indicators: Grimacing;Aching;Crying;Guarding Pain Intervention(s): Monitored during session;Limited activity within patient's tolerance;Premedicated before session           PT Goals (current goals can now be found in the care plan section) Acute Rehab PT Goals Patient Stated Goal: go to rehab close to home Progress towards PT goals: Progressing toward goals    Frequency    BID      PT Plan Current plan remains appropriate       AM-PAC PT "6 Clicks" Mobility   Outcome Measure  Help needed turning from your back to your side while in a flat bed without using bedrails?: A Lot Help needed moving from lying on your  back to sitting on the side of a flat bed without using bedrails?: A Lot Help needed moving to and from a bed to a chair (including a wheelchair)?: Total Help needed standing up from a chair using your arms (e.g., wheelchair or bedside chair)?: Total Help needed to walk in  hospital room?: Total Help needed climbing 3-5 steps with a railing? : Total 6 Click Score: 8    End of Session Equipment Utilized During Treatment: Gait belt Activity Tolerance: Patient tolerated treatment well Patient left: in bed;with call bell/phone within reach;with bed alarm set;with SCD's reapplied;with family/visitor present Nurse Communication: Mobility status PT Visit Diagnosis: Unsteadiness on feet (R26.81);Other abnormalities of gait and mobility (R26.89);Muscle weakness (generalized) (M62.81);History of falling (Z91.81);Difficulty in walking, not elsewhere classified (R26.2);Pain Pain - Right/Left: Right Pain - part of body: Leg     Time: 1037-1100 PT Time Calculation (min) (ACUTE ONLY): 23 min  Charges:  $Therapeutic Exercise: 8-22 mins $Therapeutic Activity: 8-22 mins                     Jetta Lout PTA 05/15/20, 11:17 AM

## 2020-05-15 NOTE — Progress Notes (Signed)
Physical Therapy Treatment Patient Details Name: Deanna Wall MRN: [id number] DOB: [birth year] Today's Date: 05/15/2020    History of Present Illness Per MD:  Deanna Wall is a 87 y.o. female with a known history of HTN, HLD, MS, OA, pernicious anemia, glaucoma presents to the emergency department for evaluation of right femur fracture.  Patient was in a usual state of health until this afternoon when she sustained a mechanical fall at home, slipped and fell twisting her leg.  She was evaluated at [facility] where xrays revealed a displaced distal femur fracture    PT Comments    Pt was long sitting in bed with supportive daughter at bedside upon arriving. She agrees to 2nd session with encouragement. Was able to exit R side of bed with increased time and constant vcs. Attempted standing 1 x at EOB with bed height elevated, however pt unable. Unsafe to attempt 2nd trial due to inability to maintain NWB restrictions. Pt was issued there ex that can be performed in bed with knee immobilizer donned. Overall pt much more fatigued this afternoon versus AM session. Acute PT continues to recommend DC to SNF to address deficits while assisting pt to PLOF.    Follow Up Recommendations  SNF     Equipment Recommendations  None recommended by PT    Recommendations for Other Services       Precautions / Restrictions Precautions Precautions: Fall Required Braces or Orthoses: Knee Immobilizer - Right Knee Immobilizer - Right: On at all times;Other (comment) (except with PT?OT) Restrictions Weight Bearing Restrictions: Yes RLE Weight Bearing: Non weight bearing    Mobility  Bed Mobility Overal bed mobility: Needs Assistance Bed Mobility: Supine to Sit;Sit to Supine Rolling: Min assist;Mod assist   Supine to sit: HOB elevated;Mod assist Sit to supine: Total assist   General bed mobility comments: Pt required mod assist to exit R side of bed. attempted to stand one time however pt required  total assist to return to supine 2/2 to sliding off EOB after attempt. Unsafe to stand again 2/2 to strength, endurance, and unable to maintain proper wt bearing    Transfers Overall transfer level: Needs assistance Equipment used: Rolling walker (2 wheeled) Transfers: Sit to/from Stand Sit to Stand: From elevated surface;+2 safety/equipment;Max assist         General transfer comment: Attempted standing 1 x EOB however pt unable to stand fully erect. highly recommend +2 assist for any OOB activity. pt has weakness in non operative LLE  Ambulation/Gait    General Gait Details: unable/unsafe at this time          Cognition Arousal/Alertness: Awake/alert Behavior During Therapy: WFL for tasks assessed/performed Overall Cognitive Status: Within Functional Limits for tasks assessed      General Comments: Pt is A and O x 4 and very cooperative and pleasant      Exercises Total Joint Exercises Ankle Circles/Pumps: AROM;Strengthening;Both;20 reps;Supine Quad Sets: AROM;Strengthening;Both;10 reps;Supine Gluteal Sets: AROM;Strengthening;Both;10 reps;Supine Hip ABduction/ADduction: AROM;Left;AAROM;Right;Strengthening;10 reps;Supine Straight Leg Raises: AROM;Left;AAROM;Right;Strengthening;10 reps;Supine    General Comments General comments (skin integrity, edema, etc.): issued ther ex handout with exercises that can be pereformed with knee immobilizer donned.      Pertinent Vitals/Pain Pain Assessment: 0-10 Pain Score: 5  Pain Location: R LE, increasing with movement Pain Descriptors / Indicators: Grimacing;Aching;Guarding;Discomfort;Sore Pain Intervention(s): Limited activity within patient's tolerance;Monitored during session;Premedicated before session           PT Goals (current goals can now be found in the care  plan section) Acute Rehab PT Goals Patient Stated Goal: go to rehab close to home Progress towards PT goals: Progressing toward goals    Frequency     BID      PT Plan Current plan remains appropriate       AM-PAC PT "6 Clicks" Mobility   Outcome Measure  Help needed turning from your back to your side while in a flat bed without using bedrails?: A Lot Help needed moving from lying on your back to sitting on the side of a flat bed without using bedrails?: A Lot Help needed moving to and from a bed to a chair (including a wheelchair)?: Total Help needed standing up from a chair using your arms (e.g., wheelchair or bedside chair)?: Total Help needed to walk in hospital room?: Total Help needed climbing 3-5 steps with a railing? : Total 6 Click Score: 8    End of Session Equipment Utilized During Treatment: Gait belt Activity Tolerance: Patient limited by fatigue;Patient limited by pain;Other (comment) (limited by NWB status RLE) Patient left: in bed;with call bell/phone within reach;with bed alarm set;with SCD's reapplied;with family/visitor present Nurse Communication: Mobility status PT Visit Diagnosis: Unsteadiness on feet (R26.81);Other abnormalities of gait and mobility (R26.89);Muscle weakness (generalized) (M62.81);History of falling (Z91.81);Difficulty in walking, not elsewhere classified (R26.2);Pain Pain - Right/Left: Right Pain - part of body: Leg     Time: 1416-1435 PT Time Calculation (min) (ACUTE ONLY): 19 min  Charges:  $Therapeutic Exercise: 8-22 mins $Therapeutic Activity: 8-22 mins                     Jetta Lout PTA 05/15/20, 2:51 PM

## 2020-05-15 NOTE — Progress Notes (Addendum)
Subjective:  POD #2 s/p ORIF of right distal femur fracture.   Patient reports right distal thigh pain as mild.  Patient states she was able to participate in physical therapy and get up out of bed today.  Patient's hemoglobin today is 7.1.  Objective:   VITALS:   Vitals:   05/14/20 2354 05/15/20 0732 05/15/20 1118 05/15/20 1541  BP: (!) 136/56 (!) 152/56 (!) 147/56 (!) 142/58  Pulse: 70 83 84 66  Resp: 18 19 16 18   Temp: 98.3 F (36.8 C) 98.2 F (36.8 C) 97.8 F (36.6 C) 98.6 F (37 C)  TempSrc:  Oral    SpO2: 92% (!) 85% (!) 85% 90%  Weight:      Height:        PHYSICAL EXAM: Right lower extremity: Neurovascular intact Sensation intact distally Intact pulses distally Dorsiflexion/Plantar flexion intact Incision: dressing C/D/I No cellulitis present Compartment soft  LABS  Results for orders placed or performed during the hospital encounter of 05/12/20 (from the past 24 hour(s))  CBC     Status: Abnormal   Collection Time: 05/15/20  4:58 AM  Result Value Ref Range   WBC 13.5 (H) 4.0 - 10.5 K/uL   RBC 2.44 (L) 3.87 - 5.11 MIL/uL   Hemoglobin 7.1 (L) 12.0 - 15.0 g/dL   HCT 05/17/20 (L) 41.7 - 40.8 %   MCV 86.5 80.0 - 100.0 fL   MCH 29.1 26.0 - 34.0 pg   MCHC 33.6 30.0 - 36.0 g/dL   RDW 14.4 81.8 - 56.3 %   Platelets 249 150 - 400 K/uL   nRBC 0.0 0.0 - 0.2 %  Basic metabolic panel     Status: Abnormal   Collection Time: 05/15/20  4:58 AM  Result Value Ref Range   Sodium 139 135 - 145 mmol/L   Potassium 4.6 3.5 - 5.1 mmol/L   Chloride 108 98 - 111 mmol/L   CO2 27 22 - 32 mmol/L   Glucose, Bld 121 (H) 70 - 99 mg/dL   BUN 18 8 - 23 mg/dL   Creatinine, Ser 05/17/20 0.44 - 1.00 mg/dL   Calcium 8.2 (L) 8.9 - 10.3 mg/dL   GFR, Estimated 58 (L) >60 mL/min   Anion gap 4 (L) 5 - 15  Hemoglobin     Status: Abnormal   Collection Time: 05/15/20 12:03 PM  Result Value Ref Range   Hemoglobin 7.1 (L) 12.0 - 15.0 g/dL    DG Knee 1-2 Views Right  Result Date:  05/13/2020 CLINICAL DATA:  ORIF right femur EXAM: RIGHT KNEE - 1-2 VIEW COMPARISON:  05/12/2020.  05/13/2020 FINDINGS: Comminuted fracture distal femur above the condyle in satisfactory alignment. Lateral plate and screw fixation across the fracture. Hardware in good position. Advanced degenerative change in the lateral joint compartment of the knee. IMPRESSION: Satisfactory plate fixation of supracondylar fracture distal right femur. Electronically Signed   By: 05/15/2020 M.D.   On: 05/13/2020 20:08    Assessment/Plan: 2 Days Post-Op   Active Problems:   Closed fracture of right distal femur (HCC)  I am going to order a single unit of packed red blood cells the patient's globin is 7.1.  Given the patient's age, she may not tolerate hemoglobin much lower.  Patient may be discharging to skilled nursing facility tomorrow and feel that she would be in a better position for discharge if she received a transfusion overnight tonight.  A CBC will be rechecked in the morning.  Femur fractures are associated  with significant amount of blood loss.  Patient also had significant blood loss during surgery and is not unexpected for the patient to have a significant drop in hemoglobin and hematocrit from her injury.    Juanell Fairly , MD 05/15/2020, 6:48 PM

## 2020-05-15 NOTE — Progress Notes (Signed)
PROGRESS NOTE    Deanna Wall  QIH:[id number] DOB: 1931-05-19 DOA: 05/12/2020 PCP: Corwin Levins, MD  Brief Narrative: Deanna Wall a 87 y.o.femalewith a known history of HTN, HLD, MS, OA, pernicious anemia, glaucomapresents to the emergency department for evaluation of right femur fracture. Patient was in a usual state of health until this afternoon when she sustained a mechanical fall at home, slipped and fell twisting her leg. She was evaluated at [facility] where xrays revealed a displaced distal femur fracture.s/p repair by Dr. Uvaldo Bristle on 2/21.  2/21-cardiology cleared pt for surgery.  Patient overall at low risk from cardiac standpoint, no need for ischemic cardiac evaluation. 2/22-no overnight issues. Pain controlled. PT rec. SNF 2/23: Hemoglobin dropped to 7.1.  Confirmed by afternoon repeat lab.  No bleeding.  No complaints    Assessment & Plan:   Active Problems:   Closed fracture of right distal femur (HCC)  Displaced distal right femur fracture Status post ORIF with Dr. Martha Clan 2/21 Plan: Nonweightbearing right lower extremity 8 to 12 weeks Multimodal pain control Bowel regimen Lovenox for DVT prophylaxis TOC consult for skilled nursing facility placement Continue knee immobilizer while in bed, discontinued during PT Continue therapy evaluations  Acute postoperative blood loss anemia Hemoglobin 7.1 on 2/23 Downtrending over the past 48 hours No clear source of blood loss Patient stable, on room air Plan: Hold transfusion for delay Recheck a.m. hemoglobin Transfuse as needed hemoglobin less than 7  Essential hypertension Controlled on norvasc and clonidine  Hyperlipidemia Continue Zetia  Depression Continue Celexa  Osteoarthritis Hold Voltaren  Glaucoma - ContinueXalatan    DVT prophylaxis: SQ Lovenox Code Status: Full Family Communication: None today Disposition Plan: Status is: Inpatient  Remains inpatient appropriate  because:Inpatient level of care appropriate due to severity of illness   Dispo: The patient is from: Home              Anticipated d/c is to: SNF              Anticipated d/c date is: 1 day              Patient currently is not medically stable to d/c.   Difficult to place patient No  Patient's hemoglobin remained stable anticipate medical readiness for discharge on 05/16/2020     Level of care: Med-Surg  Consultants:   Orthopedics  Procedures:   ORIF  Antimicrobials:   None   Subjective: Patient seen and examined.  Reports mild pain in lower extremity.  No other complaints  Objective: Vitals:   05/14/20 1944 05/14/20 2354 05/15/20 0732 05/15/20 1118  BP: (!) 158/67 (!) 136/56 (!) 152/56 (!) 147/56  Pulse: 85 70 83 84  Resp: 17 18 19 16   Temp: 97.7 F (36.5 C) 98.3 F (36.8 C) 98.2 F (36.8 C) 97.8 F (36.6 C)  TempSrc: Oral  Oral   SpO2: 90% 92% (!) 85% (!) 85%  Weight:      Height:        Intake/Output Summary (Last 24 hours) at 05/15/2020 1454 Last data filed at 05/15/2020 1404 Gross per 24 hour  Intake 1370 ml  Output 1501 ml  Net -131 ml   Filed Weights   05/12/20 1759 05/13/20 0136 05/13/20 1413  Weight: 113 kg 92.4 kg 92.4 kg    Examination:  General exam: Appears calm and comfortable  Respiratory system: Clear to auscultation. Respiratory effort normal. Cardiovascular system: S1 & S2 heard, RRR. No JVD, murmurs, rubs, gallops or clicks. No  pedal edema. Gastrointestinal system: Abdomen is nondistended, soft and nontender. No organomegaly or masses felt. Normal bowel sounds heard. Central nervous system: Alert and oriented. No focal neurological deficits. Extremities: Surgical dressings RLE, CDI, decreased power right lower extremity Skin: No rashes, lesions or ulcers Psychiatry: Judgement and insight appear normal. Mood & affect appropriate.     Data Reviewed: I have personally reviewed following labs and imaging studies  CBC: Recent  Labs  Lab 05/12/20 1803 05/13/20 0344 05/14/20 0456 05/15/20 0458 05/15/20 1203  WBC 19.0* 8.3 12.9* 13.5*  --   NEUTROABS 15.2*  --   --   --   --   HGB 11.0* 9.9* 8.3* 7.1* 7.1*  HCT 33.8* 30.6* 25.1* 21.1*  --   MCV 86.9 86.9 86.6 86.5  --   PLT 360 337 279 249  --    Basic Metabolic Panel: Recent Labs  Lab 05/12/20 1803 05/13/20 0344 05/14/20 0456 05/15/20 0458  NA 142 141 141 139  K 4.0 4.1 4.3 4.6  CL 109 109 111 108  CO2 24 26 24 27   GLUCOSE 139* 135* 189* 121*  BUN 13 14 17 18   CREATININE 0.86 0.95 1.11* 0.96  CALCIUM 8.8* 8.7* 8.5* 8.2*   GFR: Estimated Creatinine Clearance: 49.1 mL/min (by C-G formula based on SCr of 0.96 mg/dL). Liver Function Tests: Recent Labs  Lab 05/12/20 1803 05/13/20 0344  AST 20 19  ALT 16 14  ALKPHOS 85 79  BILITOT 1.0 1.3*  PROT 7.6 6.9  ALBUMIN 3.7 3.4*   No results for input(s): LIPASE, AMYLASE in the last 168 hours. No results for input(s): AMMONIA in the last 168 hours. Coagulation Profile: Recent Labs  Lab 05/13/20 0025  INR 1.2   Cardiac Enzymes: No results for input(s): CKTOTAL, CKMB, CKMBINDEX, TROPONINI in the last 168 hours. BNP (last 3 results) No results for input(s): PROBNP in the last 8760 hours. HbA1C: No results for input(s): HGBA1C in the last 72 hours. CBG: No results for input(s): GLUCAP in the last 168 hours. Lipid Profile: No results for input(s): CHOL, HDL, LDLCALC, TRIG, CHOLHDL, LDLDIRECT in the last 72 hours. Thyroid Function Tests: No results for input(s): TSH, T4TOTAL, FREET4, T3FREE, THYROIDAB in the last 72 hours. Anemia Panel: No results for input(s): VITAMINB12, FOLATE, FERRITIN, TIBC, IRON, RETICCTPCT in the last 72 hours. Sepsis Labs: No results for input(s): PROCALCITON, LATICACIDVEN in the last 168 hours.  Recent Results (from the past 240 hour(s))  Resp Panel by RT-PCR (Flu A&B, Covid) Nasopharyngeal Swab     Status: None   Collection Time: 05/12/20  6:05 PM   Specimen:  Nasopharyngeal Swab; Nasopharyngeal(NP) swabs in vial transport medium  Result Value Ref Range Status   SARS Coronavirus 2 by RT PCR NEGATIVE NEGATIVE Final    Comment: (NOTE) SARS-CoV-2 target nucleic acids are NOT DETECTED.  The SARS-CoV-2 RNA is generally detectable in upper respiratory specimens during the acute phase of infection. The lowest concentration of SARS-CoV-2 viral copies this assay can detect is 138 copies/mL. A negative result does not preclude SARS-Cov-2 infection and should not be used as the sole basis for treatment or other patient management decisions. A negative result may occur with  improper specimen collection/handling, submission of specimen other than nasopharyngeal swab, presence of viral mutation(s) within the areas targeted by this assay, and inadequate number of viral copies(<138 copies/mL). A negative result must be combined with clinical observations, patient history, and epidemiological information. The expected result is Negative.  Fact Sheet for Patients:  BloggerCourse.com  Fact Sheet for Healthcare Providers:  SeriousBroker.it  This test is no t yet approved or cleared by the [place] FDA and  has been authorized for detection and/or diagnosis of SARS-CoV-2 by FDA under an Emergency Use Authorization (EUA). This EUA will remain  in effect (meaning this test can be used) for the duration of the COVID-19 declaration under Section 564(b)(1) of the Act, 21 U.S.C.section 360bbb-3(b)(1), unless the authorization is terminated  or revoked sooner.       Influenza A by PCR NEGATIVE NEGATIVE Final   Influenza B by PCR NEGATIVE NEGATIVE Final    Comment: (NOTE) The Xpert Xpress SARS-CoV-2/FLU/RSV plus assay is intended as an aid in the diagnosis of influenza from Nasopharyngeal swab specimens and should not be used as a sole basis for treatment. Nasal washings and aspirates are unacceptable for  Xpert Xpress SARS-CoV-2/FLU/RSV testing.  Fact Sheet for Patients: BloggerCourse.com  Fact Sheet for Healthcare Providers: SeriousBroker.it  This test is not yet approved or cleared by the [place] FDA and has been authorized for detection and/or diagnosis of SARS-CoV-2 by FDA under an Emergency Use Authorization (EUA). This EUA will remain in effect (meaning this test can be used) for the duration of the COVID-19 declaration under Section 564(b)(1) of the Act, 21 U.S.C. section 360bbb-3(b)(1), unless the authorization is terminated or revoked.  Performed at [facility], [address], [place], [place] [address]   Surgical pcr screen     Status: None   Collection Time: 05/13/20  2:40 AM   Specimen: Nasal Mucosa; Nasal Swab  Result Value Ref Range Status   MRSA, PCR NEGATIVE NEGATIVE Final   Staphylococcus aureus NEGATIVE NEGATIVE Final    Comment: (NOTE) The Xpert SA Assay (FDA approved for NASAL specimens in patients 75 years of age and older), is one component of a comprehensive surveillance program. It is not intended to diagnose infection nor to guide or monitor treatment. Performed at [facility], [address], [place], [place] [address]          Radiology Studies: DG Knee 1-2 Views Right  Result Date: 05/13/2020 CLINICAL DATA:  ORIF right femur EXAM: RIGHT KNEE - 1-2 VIEW COMPARISON:  05/12/2020.  05/13/2020 FINDINGS: Comminuted fracture distal femur above the condyle in satisfactory alignment. Lateral plate and screw fixation across the fracture. Hardware in good position. Advanced degenerative change in the lateral joint compartment of the knee. IMPRESSION: Satisfactory plate fixation of supracondylar fracture distal right femur. Electronically Signed   By: Marlan Palau M.D.   On: 05/13/2020 20:08   DG C-Arm 1-60 Min  Result Date: 05/13/2020 CLINICAL DATA:  Surgery, elective.  Additional history provided: Right femur surgery. Provided fluoroscopy time 1 minutes, 41 seconds. EXAM: RIGHT FEMUR 2 VIEWS; DG C-ARM 1-60 MIN COMPARISON:  Radiographs of the right knee 05/12/2020. FINDINGS: Five intraoperative fluoroscopic images of the distal right femur are submitted. There has been interval ORIF of a distal right femur fracture with lateral plate and screw fixation. There is some persistent displacement of fracture fragments, but overall alignment is near anatomic. IMPRESSION: Five intraoperative fluoroscopic images from ORIF of a distal right femoral fracture, as described. Electronically Signed   By: Jackey Loge DO   On: 05/13/2020 18:36   DG FEMUR, MIN 2 VIEWS RIGHT  Result Date: 05/13/2020 CLINICAL DATA:  Surgery, elective. Additional history provided: Right femur surgery. Provided fluoroscopy time 1 minutes, 41 seconds. EXAM: RIGHT FEMUR 2 VIEWS; DG C-ARM 1-60 MIN COMPARISON:  Radiographs of the right knee 05/12/2020. FINDINGS: Five intraoperative fluoroscopic images of the distal right femur are submitted. There has been interval ORIF of a distal right femur fracture with lateral plate and screw fixation. There is some persistent displacement of fracture fragments, but overall alignment is near anatomic. IMPRESSION: Five intraoperative fluoroscopic images from ORIF of a distal right femoral fracture, as described. Electronically Signed   By: Jackey Loge DO   On: 05/13/2020 18:36        Scheduled Meds: . amLODipine  5 mg Oral Daily  . aspirin  81 mg Oral Daily  . citalopram  10 mg Oral Daily  . cloNIDine  0.3 mg Oral BID  . docusate sodium  100 mg Oral BID  . dorzolamide  1 drop Both Eyes BID  . enoxaparin (LOVENOX) injection  40 mg Subcutaneous Q24H  . ezetimibe  10 mg Oral Daily  . latanoprost  1 drop Both Eyes QHS  . nitrofurantoin  50 mg Oral Daily  . senna  1 tablet Oral BID  . traMADol  50 mg Oral Q6H  . vitamin B-12  500 mcg Oral Daily   Continuous  Infusions: . methocarbamol (ROBAXIN) IV       LOS: 3 days    Time spent: 25 minutes    Tresa Moore, MD [facility] Pager [phone]  If 7PM-7AM, please contact night-coverage 05/15/2020, 2:54 PM

## 2020-05-16 DIAGNOSIS — S72401D Unspecified fracture of lower end of right femur, subsequent encounter for closed fracture with routine healing: Secondary | ICD-10-CM | POA: Diagnosis not present

## 2020-05-16 DIAGNOSIS — E538 Deficiency of other specified B group vitamins: Secondary | ICD-10-CM | POA: Diagnosis not present

## 2020-05-16 DIAGNOSIS — E559 Vitamin D deficiency, unspecified: Secondary | ICD-10-CM | POA: Diagnosis not present

## 2020-05-16 DIAGNOSIS — E785 Hyperlipidemia, unspecified: Secondary | ICD-10-CM | POA: Diagnosis not present

## 2020-05-16 DIAGNOSIS — F32A Depression, unspecified: Secondary | ICD-10-CM | POA: Diagnosis not present

## 2020-05-16 DIAGNOSIS — G35 Multiple sclerosis: Secondary | ICD-10-CM | POA: Diagnosis not present

## 2020-05-16 DIAGNOSIS — S72401A Unspecified fracture of lower end of right femur, initial encounter for closed fracture: Secondary | ICD-10-CM | POA: Diagnosis not present

## 2020-05-16 DIAGNOSIS — M79604 Pain in right leg: Secondary | ICD-10-CM | POA: Diagnosis not present

## 2020-05-16 DIAGNOSIS — Z4789 Encounter for other orthopedic aftercare: Secondary | ICD-10-CM | POA: Diagnosis not present

## 2020-05-16 DIAGNOSIS — R5381 Other malaise: Secondary | ICD-10-CM | POA: Diagnosis not present

## 2020-05-16 DIAGNOSIS — R4182 Altered mental status, unspecified: Secondary | ICD-10-CM | POA: Diagnosis not present

## 2020-05-16 DIAGNOSIS — I10 Essential (primary) hypertension: Secondary | ICD-10-CM | POA: Diagnosis not present

## 2020-05-16 DIAGNOSIS — D510 Vitamin B12 deficiency anemia due to intrinsic factor deficiency: Secondary | ICD-10-CM | POA: Diagnosis not present

## 2020-05-16 DIAGNOSIS — K5903 Drug induced constipation: Secondary | ICD-10-CM | POA: Diagnosis not present

## 2020-05-16 DIAGNOSIS — R279 Unspecified lack of coordination: Secondary | ICD-10-CM | POA: Diagnosis not present

## 2020-05-16 DIAGNOSIS — M170 Bilateral primary osteoarthritis of knee: Secondary | ICD-10-CM | POA: Diagnosis not present

## 2020-05-16 DIAGNOSIS — H409 Unspecified glaucoma: Secondary | ICD-10-CM | POA: Diagnosis not present

## 2020-05-16 DIAGNOSIS — M25561 Pain in right knee: Secondary | ICD-10-CM | POA: Diagnosis not present

## 2020-05-16 DIAGNOSIS — M6281 Muscle weakness (generalized): Secondary | ICD-10-CM | POA: Diagnosis not present

## 2020-05-16 LAB — CBC WITH DIFFERENTIAL/PLATELET
Abs Immature Granulocytes: 0.05 10*3/uL (ref 0.00–0.07)
Basophils Absolute: 0 10*3/uL (ref 0.0–0.1)
Basophils Relative: 0 %
Eosinophils Absolute: 0.1 10*3/uL (ref 0.0–0.5)
Eosinophils Relative: 1 %
HCT: 25.8 % — ABNORMAL LOW (ref 36.0–46.0)
Hemoglobin: 8.7 g/dL — ABNORMAL LOW (ref 12.0–15.0)
Immature Granulocytes: 1 %
Lymphocytes Relative: 19 %
Lymphs Abs: 2 10*3/uL (ref 0.7–4.0)
MCH: 29.6 pg (ref 26.0–34.0)
MCHC: 33.7 g/dL (ref 30.0–36.0)
MCV: 87.8 fL (ref 80.0–100.0)
Monocytes Absolute: 0.9 10*3/uL (ref 0.1–1.0)
Monocytes Relative: 9 %
Neutro Abs: 7.2 10*3/uL (ref 1.7–7.7)
Neutrophils Relative %: 70 %
Platelets: 273 10*3/uL (ref 150–400)
RBC: 2.94 MIL/uL — ABNORMAL LOW (ref 3.87–5.11)
RDW: 15.2 % (ref 11.5–15.5)
WBC: 10.4 10*3/uL (ref 4.0–10.5)
nRBC: 0 % (ref 0.0–0.2)

## 2020-05-16 LAB — BPAM RBC
Blood Product Expiration Date: 202203262359
ISSUE DATE / TIME: 202202232244
Unit Type and Rh: 6200

## 2020-05-16 LAB — SARS CORONAVIRUS 2 (TAT 6-24 HRS): SARS Coronavirus 2: NEGATIVE

## 2020-05-16 LAB — BASIC METABOLIC PANEL
Anion gap: 6 (ref 5–15)
BUN: 17 mg/dL (ref 8–23)
CO2: 27 mmol/L (ref 22–32)
Calcium: 8 mg/dL — ABNORMAL LOW (ref 8.9–10.3)
Chloride: 105 mmol/L (ref 98–111)
Creatinine, Ser: 0.93 mg/dL (ref 0.44–1.00)
GFR, Estimated: 60 mL/min — ABNORMAL LOW (ref >60)
Glucose, Bld: 115 mg/dL — ABNORMAL HIGH (ref 70–99)
Potassium: 4.1 mmol/L (ref 3.5–5.1)
Sodium: 138 mmol/L (ref 135–145)

## 2020-05-16 LAB — TYPE AND SCREEN
ABO/RH(D): A POS
Antibody Screen: NEGATIVE
Unit division: 0

## 2020-05-16 MED ORDER — ENOXAPARIN SODIUM 40 MG/0.4ML ~~LOC~~ SOLN: 40.0000 mg | SUBCUTANEOUS | 0 refills | Status: DC

## 2020-05-16 MED ORDER — HYDROCODONE-ACETAMINOPHEN 5-325 MG PO TABS: 1.0000 | ORAL_TABLET | ORAL | 0 refills | Status: AC | PRN

## 2020-05-16 NOTE — Discharge Summary (Signed)
Physician Discharge Summary  ADRIEANNA BOTELER UVO:[id number] DOB: 10/15/1931 DOA: 05/12/2020  PCP: Corwin Levins, MD  Admit date: 05/12/2020 Discharge date: 05/16/2020  Admitted From: Home Disposition:  SNF  Recommendations for Outpatient Follow-up:  1. Follow up with PCP in 1-2 weeks 2. Follow up with orthopedics in 2 weeks  Home Health: No Equipment/Devices:None Discharge Condition:Stable CODE STATUS: FULL Diet recommendation: Regular Brief/Interim Summary: RobenaHarrisis a 87 y.o.femalewith a known history of HTN, HLD, MS, OA, pernicious anemia, glaucomapresents to the emergency department for evaluation of right femur fracture. Patient was in a usual state of health until this afternoon when she sustained a mechanical fall at home, slipped and fell twisting her leg. She was evaluated at [facility] where xrays revealed a displaced distal femur fracture.s/p repair by Dr. Uvaldo Bristle on 2/21.  2/21-cardiology cleared pt for surgery. Patient overall at low risk from cardiac standpoint, no need for ischemic cardiac evaluation. 2/22-no overnight issues. Pain controlled. PT rec. SNF 2/23: Hemoglobin dropped to 7.1.  Confirmed by afternoon repeat lab.  No bleeding.  No complaints 2/24: Transfused 1 unit PRBC per orthopedics on 05/15/2020.  Hemoglobin responded appropriately.  Stable at time of discharge.  Stable for discharge to skilled nursing facility.  Discharge Diagnoses:  Active Problems:   Closed fracture of right distal femur (HCC)  Displaced distal right femur fracture Status post ORIF with Dr. Martha Clan 2/21 Lovenox for DVT prophylaxis Nonweightbearing right lower extremity 8 to 12 weeks Knee immobilizer while in bed 21days Lovenox prescribed at time of discharge  Acute postoperative blood loss anemia Hemoglobin 7.1 on 2/23 Downtrending over the past 48 hours No clear source of blood loss Patient stable, on room air Transfused 1 unit PRBC Hemoglobin 8.7 on day of  discharge  Essential hypertension Controlled on norvasc and clonidine  Hyperlipidemia Continue Zetia  Depression Continue Celexa  Osteoarthritis Hold Voltaren  Glaucoma - ContinueXalatan  Discharge Instructions  Discharge Instructions    Diet - low sodium heart healthy   Complete by: As directed    Increase activity slowly   Complete by: As directed    No wound care   Complete by: As directed      Allergies as of 05/16/2020      Reactions   Atorvastatin    REACTION: feels funny in face and head      Medication List    TAKE these medications   amLODipine 5 MG tablet Commonly known as: NORVASC Take 1 tablet by mouth once daily   aspirin 81 MG tablet Take 81 mg by mouth daily.   cholecalciferol 25 MCG (1000 UNIT) tablet Commonly known as: VITAMIN D3 Take 2,000 Units by mouth daily.   citalopram 10 MG tablet Commonly known as: CELEXA Take 1 tablet by mouth once daily   cloNIDine 0.3 MG tablet Commonly known as: CATAPRES Take 1 tablet by mouth twice daily   diclofenac sodium 1 % Gel Commonly known as: VOLTAREN Apply 2 g topically 4 (four) times daily as needed.   enoxaparin 40 MG/0.4ML injection Commonly known as: LOVENOX Inject 0.4 mLs (40 mg total) into the skin daily for 21 days. Start taking on: May 17, 2020   ezetimibe 10 MG tablet Commonly known as: ZETIA Take 1 tablet (10 mg total) by mouth daily. Annual appt is due in  must see provider for future refills   furosemide 20 MG tablet Commonly known as: LASIX TAKE 1 TABLET BY MOUTH ONCE DAILY AS NEEDED FOR  SWELLING   HYDROcodone-acetaminophen 5-325  MG tablet Commonly known as: NORCO/VICODIN Take 1-2 tablets by mouth every 4 (four) hours as needed for up to 3 days for moderate pain (pain score 4-6). SNF use only   latanoprost 0.005 % ophthalmic solution Commonly known as: XALATAN INSTILL 1 DROP IN BOTH EYES AT BEDTIME. KEEP THE BOTTLE IN THE FRIDGE UNTIL YOU OPEN IT.    nitrofurantoin 50 MG capsule Commonly known as: MACRODANTIN Take 1 capsule by mouth once daily   polyethylene glycol powder 17 GM/SCOOP powder Commonly known as: GLYCOLAX/MIRALAX Take 17 g by mouth 2 (two) times daily as needed.   potassium chloride 10 MEQ tablet Commonly known as: KLOR-CON Take 1 tablet by mouth once daily   vitamin B-12 500 MCG tablet Commonly known as: CYANOCOBALAMIN Take 500 mcg by mouth daily.       Contact information for follow-up providers    Juanell Fairly, MD. Go on 05/31/2020.   Specialty: Orthopedic Surgery Why: @ 9:45 am Contact information: [address] [place] [address] [phone]        Corwin Levins, MD. Go on 05/23/2020.   Specialties: Internal Medicine, Radiology Why: @ 2:20 pm Contact information: [address] [place] [address] [phone]            Contact information for after-discharge care    Destination    [facility] Preferred SNF.   Service: Skilled Nursing Contact information: [address] [place] [address] [phone]                 Allergies  Allergen Reactions  . Atorvastatin     REACTION: feels funny in face and head    Consultations:  Orthopedics   Procedures/Studies: DG Chest 1 View  Result Date: 05/12/2020 CLINICAL DATA:  Larey Seat today EXAM: CHEST  1 VIEW COMPARISON:  04/14/2011 FINDINGS: Normal heart size, mediastinal contours, and pulmonary vascularity. Linear scarring in the mid lungs bilaterally and at medial LEFT base. Lungs otherwise clear. No infiltrate, pleural effusion or pneumothorax. Atherosclerotic calcification aorta. IMPRESSION: BILATERAL lung scarring. No acute abnormalities. Aortic Atherosclerosis (ICD10-I70.0). Electronically Signed   By: Ulyses Southward M.D.   On: 05/12/2020 19:06   DG Knee 1-2 Views Right  Result Date: 05/13/2020 CLINICAL DATA:  ORIF right femur EXAM: RIGHT KNEE - 1-2 VIEW COMPARISON:  05/12/2020.   05/13/2020 FINDINGS: Comminuted fracture distal femur above the condyle in satisfactory alignment. Lateral plate and screw fixation across the fracture. Hardware in good position. Advanced degenerative change in the lateral joint compartment of the knee. IMPRESSION: Satisfactory plate fixation of supracondylar fracture distal right femur. Electronically Signed   By: Marlan Palau M.D.   On: 05/13/2020 20:08   DG Knee 1-2 Views Right  Result Date: 05/12/2020 CLINICAL DATA:  Fall today EXAM: RIGHT KNEE - 1-2 VIEW COMPARISON:  None FINDINGS: Comminuted displaced oblique fracture of the distal RIGHT femoral metadiaphysis with mild apex posterior angulation and mild overriding. No dislocation. Degenerative changes of RIGHT knee greatest at lateral compartment. No joint effusion. IMPRESSION: Displaced and angulated distal RIGHT femoral metadiaphyseal fracture. Osseous demineralization with degenerative changes RIGHT knee. Electronically Signed   By: Ulyses Southward M.D.   On: 05/12/2020 19:08   DG C-Arm 1-60 Min  Result Date: 05/13/2020 CLINICAL DATA:  Surgery, elective. Additional history provided: Right femur surgery. Provided fluoroscopy time 1 minutes, 41 seconds. EXAM: RIGHT FEMUR 2 VIEWS; DG C-ARM 1-60 MIN COMPARISON:  Radiographs of the right knee 05/12/2020. FINDINGS: Five intraoperative fluoroscopic images of the  distal right femur are submitted. There has been interval ORIF of a distal right femur fracture with lateral plate and screw fixation. There is some persistent displacement of fracture fragments, but overall alignment is near anatomic. IMPRESSION: Five intraoperative fluoroscopic images from ORIF of a distal right femoral fracture, as described. Electronically Signed   By: Jackey Loge DO   On: 05/13/2020 18:36   ECHOCARDIOGRAM COMPLETE  Result Date: 05/13/2020    ECHOCARDIOGRAM REPORT   Patient Name:   MARYGRACE SANDOVAL Date of Exam: 05/13/2020 Medical Rec #:  [id number]       Height:       67.0 in  Accession #:    [id number]      Weight:       203.7 lb Date of Birth:  [birth year]        BSA:          2.038 m Patient Age:    88 years        BP:           167/58 mmHg Patient Gender: F               HR:           82 bpm. Exam Location:  [facility] Procedure: 2D Echo, Color Doppler and Cardiac Doppler Indications:     Preoperative evaluation  History:         Patient has prior history of Echocardiogram examinations. Risk                  Factors:Hypertension and Dyslipidemia.  Sonographer:     Humphrey Rolls RDCS (AE) Referring Phys:  [id number] Tonye Royalty Diagnosing Phys: Lorine Bears MD  Sonographer Comments: Technically difficult study due to poor echo windows. IMPRESSIONS  1. Left ventricular ejection fraction, by estimation, is 55 to 60%. The left ventricle has normal function. The left ventricle has no regional wall motion abnormalities. Left ventricular diastolic parameters are consistent with Grade I diastolic dysfunction (impaired relaxation).  2. Right ventricular systolic function is normal. The right ventricular size is normal. Tricuspid regurgitation signal is inadequate for assessing PA pressure.  3. The mitral valve is normal in structure. No evidence of mitral valve regurgitation. No evidence of mitral stenosis.  4. The aortic valve was not well visualized. Aortic valve regurgitation is not visualized. Mild to moderate aortic valve sclerosis/calcification is present, without any evidence of aortic stenosis.  5. The inferior vena cava is normal in size with greater than 50% respiratory variability, suggesting right atrial pressure of 3 mmHg. FINDINGS  Left Ventricle: Left ventricular ejection fraction, by estimation, is 55 to 60%. The left ventricle has normal function. The left ventricle has no regional wall motion abnormalities. The left ventricular internal cavity size was normal in size. There is  no left ventricular hypertrophy. Left ventricular diastolic parameters are consistent with Grade I  diastolic dysfunction (impaired relaxation). Right Ventricle: The right ventricular size is normal. No increase in right ventricular wall thickness. Right ventricular systolic function is normal. Tricuspid regurgitation signal is inadequate for assessing PA pressure. Left Atrium: Left atrial size was normal in size. Right Atrium: Right atrial size was normal in size. Pericardium: There is no evidence of pericardial effusion. Mitral Valve: The mitral valve is normal in structure. No evidence of mitral valve regurgitation. No evidence of mitral valve stenosis. MV peak gradient, 6.1 mmHg. The mean mitral valve gradient is 2.0 mmHg. Tricuspid Valve: The tricuspid valve is normal in structure. Tricuspid valve regurgitation  is not demonstrated. No evidence of tricuspid stenosis. Aortic Valve: The aortic valve was not well visualized. Aortic valve regurgitation is not visualized. Mild to moderate aortic valve sclerosis/calcification is present, without any evidence of aortic stenosis. Aortic valve mean gradient measures 4.0 mmHg.  Aortic valve peak gradient measures 9.6 mmHg. Aortic valve area, by VTI measures 2.60 cm. Pulmonic Valve: The pulmonic valve was normal in structure. Pulmonic valve regurgitation is mild. No evidence of pulmonic stenosis. Aorta: The aortic root is normal in size and structure. Venous: The inferior vena cava is normal in size with greater than 50% respiratory variability, suggesting right atrial pressure of 3 mmHg. IAS/Shunts: No atrial level shunt detected by color flow Doppler.  LEFT VENTRICLE PLAX 2D LVIDd:         3.20 cm     Diastology LVIDs:         2.20 cm     LV e' medial:    7.40 cm/s LV PW:         0.80 cm     LV E/e' medial:  9.5 LV IVS:        0.90 cm     LV e' lateral:   7.83 cm/s LVOT diam:     2.10 cm     LV E/e' lateral: 9.0 LV SV:         53 LV SV Index:   26 LVOT Area:     3.46 cm  LV Volumes (MOD) LV vol d, MOD A4C: 71.9 ml LV vol s, MOD A4C: 28.6 ml LV SV MOD A4C:     71.9 ml  LEFT ATRIUM         Index LA diam:    2.40 cm 1.18 cm/m  AORTIC VALVE                   PULMONIC VALVE AV Area (Vmax):    2.00 cm    PV Vmax:       1.62 m/s AV Area (Vmean):   2.33 cm    PV Vmean:      103.000 cm/s AV Area (VTI):     2.60 cm    PV VTI:        0.221 m AV Vmax:           155.00 cm/s PV Peak grad:  10.5 mmHg AV Vmean:          92.200 cm/s PV Mean grad:  5.0 mmHg AV VTI:            0.205 m AV Peak Grad:      9.6 mmHg AV Mean Grad:      4.0 mmHg LVOT Vmax:         89.40 cm/s LVOT Vmean:        61.900 cm/s LVOT VTI:          0.154 m LVOT/AV VTI ratio: 0.75  AORTA Ao Root diam: 2.70 cm MITRAL VALVE MV Area (PHT): 4.17 cm     SHUNTS MV Area VTI:   1.90 cm     Systemic VTI:  0.15 m MV Peak grad:  6.1 mmHg     Systemic Diam: 2.10 cm MV Mean grad:  2.0 mmHg MV Vmax:       1.23 m/s MV Vmean:      69.7 cm/s MV Decel Time: 182 msec MV E velocity: 70.30 cm/s MV A velocity: 105.00 cm/s MV E/A ratio:  0.67 Lorine Bears MD Electronically signed by Lorine Bears  MD Signature Date/Time: 05/13/2020/11:25:07 AM    Final    DG Hip Unilat W or Wo Pelvis 2-3 Views Right  Result Date: 05/12/2020 CLINICAL DATA:  Fall today EXAM: DG HIP (WITH OR WITHOUT PELVIS) 2-3V RIGHT COMPARISON:  None FINDINGS: Osseous demineralization. Hip and SI joint spaces preserved. Iliopsoas tendon calcification LEFT hip. No acute fracture, dislocation, or bone destruction. Degenerative disc and facet disease changes at visualized lower lumbar spine. IMPRESSION: No acute osseous abnormalities involving the pelvis or hips. Electronically Signed   By: Ulyses Southward M.D.   On: 05/12/2020 19:07   DG FEMUR, MIN 2 VIEWS RIGHT  Result Date: 05/13/2020 CLINICAL DATA:  Surgery, elective. Additional history provided: Right femur surgery. Provided fluoroscopy time 1 minutes, 41 seconds. EXAM: RIGHT FEMUR 2 VIEWS; DG C-ARM 1-60 MIN COMPARISON:  Radiographs of the right knee 05/12/2020. FINDINGS: Five intraoperative fluoroscopic images of the  distal right femur are submitted. There has been interval ORIF of a distal right femur fracture with lateral plate and screw fixation. There is some persistent displacement of fracture fragments, but overall alignment is near anatomic. IMPRESSION: Five intraoperative fluoroscopic images from ORIF of a distal right femoral fracture, as described. Electronically Signed   By: Jackey Loge DO   On: 05/13/2020 18:36    (Echo, Carotid, EGD, Colonoscopy, ERCP)    Subjective: Patient seen and examined the day of discharge.  Stable, no distress.  Stable for discharge to skilled nursing facility.  Discharge Exam: Vitals:   05/16/20 0812 05/16/20 1209  BP: (!) 153/63 (!) 150/63  Pulse: 77 70  Resp: 16 18  Temp: 97.8 F (36.6 C) 98.1 F (36.7 C)  SpO2: 92% 93%   Vitals:   05/16/20 0230 05/16/20 0421 05/16/20 0812 05/16/20 1209  BP: 138/62 138/62 (!) 153/63 (!) 150/63  Pulse: 68 71 77 70  Resp: 20 20 16 18   Temp: 98.2 F (36.8 C) 98.7 F (37.1 C) 97.8 F (36.6 C) 98.1 F (36.7 C)  TempSrc: Oral Oral    SpO2: 93% 93% 92% 93%  Weight:      Height:        General: Pt is alert, awake, not in acute distress Cardiovascular: RRR, S1/S2 +, no rubs, no gallops Respiratory: CTA bilaterally, no wheezing, no rhonchi Abdominal: Soft, NT, ND, bowel sounds + Extremities: no edema, no cyanosis    The results of significant diagnostics from this hospitalization (including imaging, microbiology, ancillary and laboratory) are listed below for reference.     Microbiology: Recent Results (from the past 240 hour(s))  Resp Panel by RT-PCR (Flu A&B, Covid) Nasopharyngeal Swab     Status: None   Collection Time: 05/12/20  6:05 PM   Specimen: Nasopharyngeal Swab; Nasopharyngeal(NP) swabs in vial transport medium  Result Value Ref Range Status   SARS Coronavirus 2 by RT PCR NEGATIVE NEGATIVE Final    Comment: (NOTE) SARS-CoV-2 target nucleic acids are NOT DETECTED.  The SARS-CoV-2 RNA is generally  detectable in upper respiratory specimens during the acute phase of infection. The lowest concentration of SARS-CoV-2 viral copies this assay can detect is 138 copies/mL. A negative result does not preclude SARS-Cov-2 infection and should not be used as the sole basis for treatment or other patient management decisions. A negative result may occur with  improper specimen collection/handling, submission of specimen other than nasopharyngeal swab, presence of viral mutation(s) within the areas targeted by this assay, and inadequate number of viral copies(<138 copies/mL). A negative result must be combined with clinical  observations, patient history, and epidemiological information. The expected result is Negative.  Fact Sheet for Patients:  BloggerCourse.com  Fact Sheet for Healthcare Providers:  SeriousBroker.it  This test is no t yet approved or cleared by the [place] FDA and  has been authorized for detection and/or diagnosis of SARS-CoV-2 by FDA under an Emergency Use Authorization (EUA). This EUA will remain  in effect (meaning this test can be used) for the duration of the COVID-19 declaration under Section 564(b)(1) of the Act, 21 U.S.C.section 360bbb-3(b)(1), unless the authorization is terminated  or revoked sooner.       Influenza A by PCR NEGATIVE NEGATIVE Final   Influenza B by PCR NEGATIVE NEGATIVE Final    Comment: (NOTE) The Xpert Xpress SARS-CoV-2/FLU/RSV plus assay is intended as an aid in the diagnosis of influenza from Nasopharyngeal swab specimens and should not be used as a sole basis for treatment. Nasal washings and aspirates are unacceptable for Xpert Xpress SARS-CoV-2/FLU/RSV testing.  Fact Sheet for Patients: BloggerCourse.com  Fact Sheet for Healthcare Providers: SeriousBroker.it  This test is not yet approved or cleared by the [place] FDA  and has been authorized for detection and/or diagnosis of SARS-CoV-2 by FDA under an Emergency Use Authorization (EUA). This EUA will remain in effect (meaning this test can be used) for the duration of the COVID-19 declaration under Section 564(b)(1) of the Act, 21 U.S.C. section 360bbb-3(b)(1), unless the authorization is terminated or revoked.  Performed at [facility], [address], [place], [place] [address]   Surgical pcr screen     Status: None   Collection Time: 05/13/20  2:40 AM   Specimen: Nasal Mucosa; Nasal Swab  Result Value Ref Range Status   MRSA, PCR NEGATIVE NEGATIVE Final   Staphylococcus aureus NEGATIVE NEGATIVE Final    Comment: (NOTE) The Xpert SA Assay (FDA approved for NASAL specimens in patients 29 years of age and older), is one component of a comprehensive surveillance program. It is not intended to diagnose infection nor to guide or monitor treatment. Performed at [facility], [address] [place], [place] [address]   SARS CORONAVIRUS 2 (TAT 6-24 HRS) Nasopharyngeal Nasopharyngeal Swab     Status: None   Collection Time: 05/15/20  6:06 PM   Specimen: Nasopharyngeal Swab  Result Value Ref Range Status   SARS Coronavirus 2 NEGATIVE NEGATIVE Final    Comment: (NOTE) SARS-CoV-2 target nucleic acids are NOT DETECTED.  The SARS-CoV-2 RNA is generally detectable in upper and lower respiratory specimens during the acute phase of infection. Negative results do not preclude SARS-CoV-2 infection, do not rule out co-infections with other pathogens, and should not be used as the sole basis for treatment or other patient management decisions. Negative results must be combined with clinical observations, patient history, and epidemiological information. The expected result is Negative.  Fact Sheet for Patients: HairSlick.no  Fact Sheet for Healthcare  Providers: quierodirigir.com  This test is not yet approved or cleared by the [place] FDA and  has been authorized for detection and/or diagnosis of SARS-CoV-2 by FDA under an Emergency Use Authorization (EUA). This EUA will remain  in effect (meaning this test can be used) for the duration of the COVID-19 declaration under Se ction 564(b)(1) of the Act, 21 U.S.C. section 360bbb-3(b)(1), unless the authorization is terminated or revoked sooner.  Performed at [facility], [address], [place], [place] [address]      Labs: BNP (last 3 results) No results for  input(s): BNP in the last 8760 hours. Basic Metabolic Panel: Recent Labs  Lab 05/12/20 1803 05/13/20 0344 05/14/20 0456 05/15/20 0458 05/16/20 0520  NA 142 141 141 139 138  K 4.0 4.1 4.3 4.6 4.1  CL 109 109 111 108 105  CO2 24 26 24 27 27   GLUCOSE 139* 135* 189* 121* 115*  BUN 13 14 17 18 17   CREATININE 0.86 0.95 1.11* 0.96 0.93  CALCIUM 8.8* 8.7* 8.5* 8.2* 8.0*   Liver Function Tests: Recent Labs  Lab 05/12/20 1803 05/13/20 0344  AST 20 19  ALT 16 14  ALKPHOS 85 79  BILITOT 1.0 1.3*  PROT 7.6 6.9  ALBUMIN 3.7 3.4*   No results for input(s): LIPASE, AMYLASE in the last 168 hours. No results for input(s): AMMONIA in the last 168 hours. CBC: Recent Labs  Lab 05/12/20 1803 05/13/20 0344 05/14/20 0456 05/15/20 0458 05/15/20 1203 05/16/20 0520  WBC 19.0* 8.3 12.9* 13.5*  --  10.4  NEUTROABS 15.2*  --   --   --   --  7.2  HGB 11.0* 9.9* 8.3* 7.1* 7.1* 8.7*  HCT 33.8* 30.6* 25.1* 21.1*  --  25.8*  MCV 86.9 86.9 86.6 86.5  --  87.8  PLT 360 337 279 249  --  273   Cardiac Enzymes: No results for input(s): CKTOTAL, CKMB, CKMBINDEX, TROPONINI in the last 168 hours. BNP: Invalid input(s): POCBNP CBG: No results for input(s): GLUCAP in the last 168 hours. D-Dimer No results for input(s): DDIMER in the last 72 hours. Hgb A1c No results for input(s): HGBA1C in the  last 72 hours. Lipid Profile No results for input(s): CHOL, HDL, LDLCALC, TRIG, CHOLHDL, LDLDIRECT in the last 72 hours. Thyroid function studies No results for input(s): TSH, T4TOTAL, T3FREE, THYROIDAB in the last 72 hours.  Invalid input(s): FREET3 Anemia work up No results for input(s): VITAMINB12, FOLATE, FERRITIN, TIBC, IRON, RETICCTPCT in the last 72 hours. Urinalysis    Component Value Date/Time   COLORURINE YELLOW 05/10/2019 1041   APPEARANCEUR Sl Cloudy (A) 05/10/2019 1041   LABSPEC 1.020 05/10/2019 1041   PHURINE 6.5 05/10/2019 1041   GLUCOSEU NEGATIVE 05/10/2019 1041   HGBUR NEGATIVE 05/10/2019 1041   HGBUR negative 11/25/2006 0821   BILIRUBINUR NEGATIVE 05/10/2019 1041   BILIRUBINUR negative 12/26/2015 1412   KETONESUR NEGATIVE 05/10/2019 1041   PROTEINUR negative 12/26/2015 1412   UROBILINOGEN 0.2 05/10/2019 1041   NITRITE NEGATIVE 05/10/2019 1041   LEUKOCYTESUR LARGE (A) 05/10/2019 1041   Sepsis Labs Invalid input(s): PROCALCITONIN,  WBC,  LACTICIDVEN Microbiology Recent Results (from the past 240 hour(s))  Resp Panel by RT-PCR (Flu A&B, Covid) Nasopharyngeal Swab     Status: None   Collection Time: 05/12/20  6:05 PM   Specimen: Nasopharyngeal Swab; Nasopharyngeal(NP) swabs in vial transport medium  Result Value Ref Range Status   SARS Coronavirus 2 by RT PCR NEGATIVE NEGATIVE Final    Comment: (NOTE) SARS-CoV-2 target nucleic acids are NOT DETECTED.  The SARS-CoV-2 RNA is generally detectable in upper respiratory specimens during the acute phase of infection. The lowest concentration of SARS-CoV-2 viral copies this assay can detect is 138 copies/mL. A negative result does not preclude SARS-Cov-2 infection and should not be used as the sole basis for treatment or other patient management decisions. A negative result may occur with  improper specimen collection/handling, submission of specimen other than nasopharyngeal swab, presence of viral mutation(s)  within the areas targeted by this assay, and inadequate number of viral copies(<138 copies/mL).  A negative result must be combined with clinical observations, patient history, and epidemiological information. The expected result is Negative.  Fact Sheet for Patients:  BloggerCourse.com  Fact Sheet for Healthcare Providers:  SeriousBroker.it  This test is no t yet approved or cleared by the [place] FDA and  has been authorized for detection and/or diagnosis of SARS-CoV-2 by FDA under an Emergency Use Authorization (EUA). This EUA will remain  in effect (meaning this test can be used) for the duration of the COVID-19 declaration under Section 564(b)(1) of the Act, 21 U.S.C.section 360bbb-3(b)(1), unless the authorization is terminated  or revoked sooner.       Influenza A by PCR NEGATIVE NEGATIVE Final   Influenza B by PCR NEGATIVE NEGATIVE Final    Comment: (NOTE) The Xpert Xpress SARS-CoV-2/FLU/RSV plus assay is intended as an aid in the diagnosis of influenza from Nasopharyngeal swab specimens and should not be used as a sole basis for treatment. Nasal washings and aspirates are unacceptable for Xpert Xpress SARS-CoV-2/FLU/RSV testing.  Fact Sheet for Patients: BloggerCourse.com  Fact Sheet for Healthcare Providers: [email or web address]  This test is not yet approved or cleared by the [place] FDA and has been authorized for detection and/or diagnosis of SARS-CoV-2 by FDA under an Emergency Use Authorization (EUA). This EUA will remain in effect (meaning this test can be used) for the duration of the COVID-19 declaration under Section 564(b)(1) of the Act, 21 U.S.C. section 360bbb-3(b)(1), unless the authorization is terminated or revoked.  Performed at [facility], [address], [place], [place] [address]   Surgical pcr screen     Status: None    Collection Time: 05/13/20  2:40 AM   Specimen: Nasal Mucosa; Nasal Swab  Result Value Ref Range Status   MRSA, PCR NEGATIVE NEGATIVE Final   Staphylococcus aureus NEGATIVE NEGATIVE Final    Comment: (NOTE) The Xpert SA Assay (FDA approved for NASAL specimens in patients 78 years of age and older), is one component of a comprehensive surveillance program. It is not intended to diagnose infection nor to guide or monitor treatment. Performed at [facility], [address] [place], [place] [address]   SARS CORONAVIRUS 2 (TAT 6-24 HRS) Nasopharyngeal Nasopharyngeal Swab     Status: None   Collection Time: 05/15/20  6:06 PM   Specimen: Nasopharyngeal Swab  Result Value Ref Range Status   SARS Coronavirus 2 NEGATIVE NEGATIVE Final    Comment: (NOTE) SARS-CoV-2 target nucleic acids are NOT DETECTED.  The SARS-CoV-2 RNA is generally detectable in upper and lower respiratory specimens during the acute phase of infection. Negative results do not preclude SARS-CoV-2 infection, do not rule out co-infections with other pathogens, and should not be used as the sole basis for treatment or other patient management decisions. Negative results must be combined with clinical observations, patient history, and epidemiological information. The expected result is Negative.  Fact Sheet for Patients: HairSlick.no  Fact Sheet for Healthcare Providers: quierodirigir.com  This test is not yet approved or cleared by the [place] FDA and  has been authorized for detection and/or diagnosis of SARS-CoV-2 by FDA under an Emergency Use Authorization (EUA). This EUA will remain  in effect (meaning this test can be used) for the duration of the COVID-19 declaration under Se ction 564(b)(1) of the Act, 21 U.S.C. section 360bbb-3(b)(1), unless the authorization is terminated or revoked sooner.  Performed at [facility], [address], [place], [place] [address]  Time coordinating discharge: Over 30 minutes  SIGNED:   Tresa MooreSudheer B Sreenath, MD  [facility] 05/16/2020, 12:14 PM Pager   If 7PM-7AM, please contact night-coverage

## 2020-05-16 NOTE — Progress Notes (Signed)
PT Cancellation Note  Patient Details Name: Deanna Wall MRN: [id number] DOB: [birth year]   Cancelled Treatment:     PT attempt. Pt politely refused stating," I'm feeling a little dizzy and want to eat breakfast first." BP checked 151/56. PT will return at a later time and treat when appropriate/willing.    Rushie Chestnut 05/16/2020, 11:02 AM

## 2020-05-23 ENCOUNTER — Inpatient Hospital Stay: Payer: Medicare Other | Admitting: Internal Medicine

## 2020-05-24 DIAGNOSIS — M25561 Pain in right knee: Secondary | ICD-10-CM | POA: Diagnosis not present

## 2020-05-24 DIAGNOSIS — M79604 Pain in right leg: Secondary | ICD-10-CM | POA: Diagnosis not present

## 2020-05-24 DIAGNOSIS — K5903 Drug induced constipation: Secondary | ICD-10-CM | POA: Diagnosis not present

## 2020-05-24 DIAGNOSIS — S72401D Unspecified fracture of lower end of right femur, subsequent encounter for closed fracture with routine healing: Secondary | ICD-10-CM | POA: Diagnosis not present

## 2020-05-31 DIAGNOSIS — S72401A Unspecified fracture of lower end of right femur, initial encounter for closed fracture: Secondary | ICD-10-CM | POA: Diagnosis not present

## 2020-06-04 DIAGNOSIS — G35 Multiple sclerosis: Secondary | ICD-10-CM | POA: Diagnosis not present

## 2020-06-04 DIAGNOSIS — M6281 Muscle weakness (generalized): Secondary | ICD-10-CM | POA: Diagnosis not present

## 2020-06-04 DIAGNOSIS — S72401D Unspecified fracture of lower end of right femur, subsequent encounter for closed fracture with routine healing: Secondary | ICD-10-CM | POA: Diagnosis not present

## 2020-06-08 DIAGNOSIS — D510 Vitamin B12 deficiency anemia due to intrinsic factor deficiency: Secondary | ICD-10-CM | POA: Diagnosis not present

## 2020-06-08 DIAGNOSIS — H409 Unspecified glaucoma: Secondary | ICD-10-CM | POA: Diagnosis not present

## 2020-06-08 DIAGNOSIS — D62 Acute posthemorrhagic anemia: Secondary | ICD-10-CM | POA: Diagnosis not present

## 2020-06-08 DIAGNOSIS — E559 Vitamin D deficiency, unspecified: Secondary | ICD-10-CM | POA: Diagnosis not present

## 2020-06-08 DIAGNOSIS — M170 Bilateral primary osteoarthritis of knee: Secondary | ICD-10-CM | POA: Diagnosis not present

## 2020-06-08 DIAGNOSIS — S72401D Unspecified fracture of lower end of right femur, subsequent encounter for closed fracture with routine healing: Secondary | ICD-10-CM | POA: Diagnosis not present

## 2020-06-08 DIAGNOSIS — S72451D Displaced supracondylar fracture without intracondylar extension of lower end of right femur, subsequent encounter for closed fracture with routine healing: Secondary | ICD-10-CM | POA: Diagnosis not present

## 2020-06-08 DIAGNOSIS — I10 Essential (primary) hypertension: Secondary | ICD-10-CM | POA: Diagnosis not present

## 2020-06-08 DIAGNOSIS — G35 Multiple sclerosis: Secondary | ICD-10-CM | POA: Diagnosis not present

## 2020-06-08 DIAGNOSIS — E785 Hyperlipidemia, unspecified: Secondary | ICD-10-CM | POA: Diagnosis not present

## 2020-06-08 DIAGNOSIS — Z9181 History of falling: Secondary | ICD-10-CM | POA: Diagnosis not present

## 2020-06-08 DIAGNOSIS — F32A Depression, unspecified: Secondary | ICD-10-CM | POA: Diagnosis not present

## 2020-06-10 ENCOUNTER — Other Ambulatory Visit: Payer: Self-pay | Admitting: Internal Medicine

## 2020-06-10 ENCOUNTER — Telehealth: Payer: Self-pay | Admitting: Internal Medicine

## 2020-06-10 NOTE — Telephone Encounter (Signed)
Glori Bickers from home health has called requesting verbal orders to continue:  Once a week for 1 week Twice a week for 1 week Once a week for 1 week  Once a month for 1 month  And 2 times as needed   Also wanting to request for a social worker Also informing us that the patient had a fall and broke her right leg  Pam: [phone]

## 2020-06-10 NOTE — Telephone Encounter (Signed)
[name] from [facility] calling to advise that they will also be getting OT orders from Dr Martha Clan at [facility] [phone]

## 2020-06-10 NOTE — Telephone Encounter (Signed)
Ok for verbals 

## 2020-06-10 NOTE — Telephone Encounter (Signed)
Verbals left on [employer]

## 2020-06-10 NOTE — Telephone Encounter (Signed)
Verbal orders given to Stephanie.  °

## 2020-06-10 NOTE — Telephone Encounter (Signed)
Please refill as per office routine med refill policy (all routine meds refilled for 3 mo or monthly per pt preference up to one year from last visit, then month to month grace period for 3 mo, then further med refills will have to be denied)  

## 2020-06-21 DIAGNOSIS — S72401A Unspecified fracture of lower end of right femur, initial encounter for closed fracture: Secondary | ICD-10-CM | POA: Diagnosis not present

## 2020-06-23 ENCOUNTER — Other Ambulatory Visit: Payer: Self-pay | Admitting: Internal Medicine

## 2020-06-23 NOTE — Telephone Encounter (Signed)
Please refill as per office routine med refill policy (all routine meds refilled for 3 mo or monthly per pt preference up to one year from last visit, then month to month grace period for 3 mo, then further med refills will have to be denied)  

## 2020-06-26 ENCOUNTER — Telehealth: Payer: Self-pay | Admitting: Internal Medicine

## 2020-06-26 DIAGNOSIS — G35 Multiple sclerosis: Secondary | ICD-10-CM

## 2020-06-26 DIAGNOSIS — R269 Unspecified abnormalities of gait and mobility: Secondary | ICD-10-CM

## 2020-06-26 DIAGNOSIS — S7290XS Unspecified fracture of unspecified femur, sequela: Secondary | ICD-10-CM

## 2020-06-26 NOTE — Telephone Encounter (Signed)
Patient notified that a hard copy order will be at the front desk

## 2020-06-26 NOTE — Telephone Encounter (Signed)
Ok done hardcopy to Ms Levada Schilling

## 2020-06-26 NOTE — Telephone Encounter (Signed)
What type of DME ([employer]) would you like your provider to order? wheelchair  Who would like to get the DME from? No preference  Patient has a broken femur, her daughter is calling to request order for wheelchair

## 2020-07-05 DIAGNOSIS — S72401D Unspecified fracture of lower end of right femur, subsequent encounter for closed fracture with routine healing: Secondary | ICD-10-CM | POA: Diagnosis not present

## 2020-07-05 DIAGNOSIS — M6281 Muscle weakness (generalized): Secondary | ICD-10-CM | POA: Diagnosis not present

## 2020-07-05 DIAGNOSIS — G35 Multiple sclerosis: Secondary | ICD-10-CM | POA: Diagnosis not present

## 2020-07-08 DIAGNOSIS — E559 Vitamin D deficiency, unspecified: Secondary | ICD-10-CM | POA: Diagnosis not present

## 2020-07-08 DIAGNOSIS — F32A Depression, unspecified: Secondary | ICD-10-CM | POA: Diagnosis not present

## 2020-07-08 DIAGNOSIS — G35 Multiple sclerosis: Secondary | ICD-10-CM | POA: Diagnosis not present

## 2020-07-08 DIAGNOSIS — D62 Acute posthemorrhagic anemia: Secondary | ICD-10-CM | POA: Diagnosis not present

## 2020-07-08 DIAGNOSIS — D510 Vitamin B12 deficiency anemia due to intrinsic factor deficiency: Secondary | ICD-10-CM | POA: Diagnosis not present

## 2020-07-08 DIAGNOSIS — H409 Unspecified glaucoma: Secondary | ICD-10-CM | POA: Diagnosis not present

## 2020-07-08 DIAGNOSIS — Z9181 History of falling: Secondary | ICD-10-CM | POA: Diagnosis not present

## 2020-07-08 DIAGNOSIS — S72401D Unspecified fracture of lower end of right femur, subsequent encounter for closed fracture with routine healing: Secondary | ICD-10-CM | POA: Diagnosis not present

## 2020-07-08 DIAGNOSIS — S72451D Displaced supracondylar fracture without intracondylar extension of lower end of right femur, subsequent encounter for closed fracture with routine healing: Secondary | ICD-10-CM | POA: Diagnosis not present

## 2020-07-08 DIAGNOSIS — I10 Essential (primary) hypertension: Secondary | ICD-10-CM | POA: Diagnosis not present

## 2020-07-08 DIAGNOSIS — E785 Hyperlipidemia, unspecified: Secondary | ICD-10-CM | POA: Diagnosis not present

## 2020-07-08 DIAGNOSIS — M170 Bilateral primary osteoarthritis of knee: Secondary | ICD-10-CM | POA: Diagnosis not present

## 2020-07-09 ENCOUNTER — Other Ambulatory Visit: Payer: Self-pay | Admitting: Internal Medicine

## 2020-07-09 NOTE — Telephone Encounter (Signed)
Please refill as per office routine med refill policy (all routine meds refilled for 3 mo or monthly per pt preference up to one year from last visit, then month to month grace period for 3 mo, then further med refills will have to be denied)  

## 2020-07-10 NOTE — Telephone Encounter (Signed)
Faxed

## 2020-07-10 NOTE — Telephone Encounter (Signed)
Follow up message    [facility] last office notes and additional information regarding wheelhcair request

## 2020-07-23 NOTE — Telephone Encounter (Signed)
Patients daughter called and said that [facility] needs something changed on the order form to match the office form. Please advise   [employer]: [phone]

## 2020-07-29 NOTE — Telephone Encounter (Signed)
Sorry, I am unable since there is no office visit after the femur fracture to addend

## 2020-07-29 NOTE — Telephone Encounter (Signed)
Spoke with Judeth Cornfield from [employer] and she states that what was on the order should also be addended in the office notes. It should state the following:  "patient suffers from femur fracture which impairs their ability to perform daily activities like all ADLs in the home. A cane or walker will not resolve issue with performing daily activities. A wheelchair will allow patient to safely perform daily activities. The patient can safely propel the wheelchair in the home."  Please advise

## 2020-07-30 NOTE — Telephone Encounter (Signed)
Patient is scheduled for an OV on 5/23. They wanted to wait until after the patient seen the surgeon on 5/20

## 2020-08-04 DIAGNOSIS — M6281 Muscle weakness (generalized): Secondary | ICD-10-CM | POA: Diagnosis not present

## 2020-08-04 DIAGNOSIS — S72401D Unspecified fracture of lower end of right femur, subsequent encounter for closed fracture with routine healing: Secondary | ICD-10-CM | POA: Diagnosis not present

## 2020-08-04 DIAGNOSIS — G35 Multiple sclerosis: Secondary | ICD-10-CM | POA: Diagnosis not present

## 2020-08-07 DIAGNOSIS — G35 Multiple sclerosis: Secondary | ICD-10-CM | POA: Diagnosis not present

## 2020-08-07 DIAGNOSIS — S72451D Displaced supracondylar fracture without intracondylar extension of lower end of right femur, subsequent encounter for closed fracture with routine healing: Secondary | ICD-10-CM | POA: Diagnosis not present

## 2020-08-07 DIAGNOSIS — M170 Bilateral primary osteoarthritis of knee: Secondary | ICD-10-CM | POA: Diagnosis not present

## 2020-08-07 DIAGNOSIS — E559 Vitamin D deficiency, unspecified: Secondary | ICD-10-CM | POA: Diagnosis not present

## 2020-08-08 ENCOUNTER — Other Ambulatory Visit: Payer: Self-pay | Admitting: Internal Medicine

## 2020-08-09 DIAGNOSIS — S72401A Unspecified fracture of lower end of right femur, initial encounter for closed fracture: Secondary | ICD-10-CM | POA: Diagnosis not present

## 2020-08-12 ENCOUNTER — Encounter: Payer: Self-pay | Admitting: Internal Medicine

## 2020-08-12 ENCOUNTER — Ambulatory Visit (INDEPENDENT_AMBULATORY_CARE_PROVIDER_SITE_OTHER): Payer: Medicare Other | Admitting: Internal Medicine

## 2020-08-12 ENCOUNTER — Other Ambulatory Visit: Payer: Self-pay

## 2020-08-12 VITALS — BP 110/60 | HR 45 | Temp 97.7°F | Ht 67.0 in

## 2020-08-12 DIAGNOSIS — D509 Iron deficiency anemia, unspecified: Secondary | ICD-10-CM

## 2020-08-12 DIAGNOSIS — Z0000 Encounter for general adult medical examination without abnormal findings: Secondary | ICD-10-CM

## 2020-08-12 DIAGNOSIS — E538 Deficiency of other specified B group vitamins: Secondary | ICD-10-CM

## 2020-08-12 DIAGNOSIS — E782 Mixed hyperlipidemia: Secondary | ICD-10-CM | POA: Diagnosis not present

## 2020-08-12 DIAGNOSIS — N390 Urinary tract infection, site not specified: Secondary | ICD-10-CM | POA: Diagnosis not present

## 2020-08-12 DIAGNOSIS — E559 Vitamin D deficiency, unspecified: Secondary | ICD-10-CM

## 2020-08-12 DIAGNOSIS — R7302 Impaired glucose tolerance (oral): Secondary | ICD-10-CM | POA: Diagnosis not present

## 2020-08-12 DIAGNOSIS — S72401D Unspecified fracture of lower end of right femur, subsequent encounter for closed fracture with routine healing: Secondary | ICD-10-CM

## 2020-08-12 DIAGNOSIS — Z0001 Encounter for general adult medical examination with abnormal findings: Secondary | ICD-10-CM

## 2020-08-12 DIAGNOSIS — I10 Essential (primary) hypertension: Secondary | ICD-10-CM

## 2020-08-12 LAB — URINALYSIS, ROUTINE W REFLEX MICROSCOPIC
Bilirubin Urine: NEGATIVE
Hgb urine dipstick: NEGATIVE
Ketones, ur: NEGATIVE
Leukocytes,Ua: NEGATIVE
Nitrite: NEGATIVE
RBC / HPF: NONE SEEN (ref 0–?)
Specific Gravity, Urine: 1.015 (ref 1.000–1.030)
Total Protein, Urine: NEGATIVE
Urine Glucose: NEGATIVE
Urobilinogen, UA: 0.2 (ref 0.0–1.0)
pH: 7.5 (ref 5.0–8.0)

## 2020-08-12 LAB — BASIC METABOLIC PANEL
BUN: 11 mg/dL (ref 6–23)
CO2: 28 mEq/L (ref 19–32)
Calcium: 9.3 mg/dL (ref 8.4–10.5)
Chloride: 108 mEq/L (ref 96–112)
Creatinine, Ser: 0.77 mg/dL (ref 0.40–1.20)
GFR: 69.7 mL/min (ref >60.00)
Glucose, Bld: 99 mg/dL (ref 70–99)
Potassium: 3.8 mEq/L (ref 3.5–5.1)
Sodium: 142 mEq/L (ref 135–145)

## 2020-08-12 LAB — CBC WITH DIFFERENTIAL/PLATELET
Basophils Absolute: 0 10*3/uL (ref 0.0–0.1)
Basophils Relative: 0.7 % (ref 0.0–3.0)
Eosinophils Absolute: 0.1 10*3/uL (ref 0.0–0.7)
Eosinophils Relative: 1.2 % (ref 0.0–5.0)
HCT: 33.1 % — ABNORMAL LOW (ref 36.0–46.0)
Hemoglobin: 10.9 g/dL — ABNORMAL LOW (ref 12.0–15.0)
Lymphocytes Relative: 33.5 % (ref 12.0–46.0)
Lymphs Abs: 2.1 10*3/uL (ref 0.7–4.0)
MCHC: 33 g/dL (ref 30.0–36.0)
MCV: 85 fl (ref 78.0–100.0)
Monocytes Absolute: 0.5 10*3/uL (ref 0.1–1.0)
Monocytes Relative: 7.4 % (ref 3.0–12.0)
Neutro Abs: 3.6 10*3/uL (ref 1.4–7.7)
Neutrophils Relative %: 57.2 % (ref 43.0–77.0)
Platelets: 309 10*3/uL (ref 150.0–400.0)
RBC: 3.89 Mil/uL (ref 3.87–5.11)
RDW: 15 % (ref 11.5–15.5)
WBC: 6.3 10*3/uL (ref 4.0–10.5)

## 2020-08-12 LAB — VITAMIN B12: Vitamin B-12: 506 pg/mL (ref 211–911)

## 2020-08-12 LAB — HEPATIC FUNCTION PANEL
ALT: 9 U/L (ref 0–35)
AST: 12 U/L (ref 0–37)
Albumin: 3.4 g/dL — ABNORMAL LOW (ref 3.5–5.2)
Alkaline Phosphatase: 96 U/L (ref 39–117)
Bilirubin, Direct: 0.1 mg/dL (ref 0.0–0.3)
Total Bilirubin: 0.6 mg/dL (ref 0.2–1.2)
Total Protein: 7.1 g/dL (ref 6.0–8.3)

## 2020-08-12 LAB — LIPID PANEL
Cholesterol: 170 mg/dL (ref 0–200)
HDL: 50.1 mg/dL (ref >39.00)
LDL Cholesterol: 103 mg/dL — ABNORMAL HIGH (ref 0–99)
NonHDL: 119.71
Total CHOL/HDL Ratio: 3
Triglycerides: 85 mg/dL (ref 0.0–149.0)
VLDL: 17 mg/dL (ref 0.0–40.0)

## 2020-08-12 LAB — HEMOGLOBIN A1C: Hgb A1c MFr Bld: 6.4 % (ref 4.6–6.5)

## 2020-08-12 LAB — TSH: TSH: 5.65 u[IU]/mL — ABNORMAL HIGH (ref 0.35–4.50)

## 2020-08-12 LAB — FERRITIN: Ferritin: 67.3 ng/mL (ref 10.0–291.0)

## 2020-08-12 LAB — VITAMIN D 25 HYDROXY (VIT D DEFICIENCY, FRACTURES): VITD: 29.32 ng/mL — ABNORMAL LOW (ref 30.00–100.00)

## 2020-08-12 LAB — IBC PANEL
Iron: 54 ug/dL (ref 42–145)
Saturation Ratios: 17.9 % — ABNORMAL LOW (ref 20.0–50.0)
Transferrin: 215 mg/dL (ref 212.0–360.0)

## 2020-08-12 NOTE — Patient Instructions (Signed)
Your prescription and notes for the wheelchair will be sent to [facility]  Please continue all other medications as before, and refills have been done if requested.  Please have the pharmacy call with any other refills you may need.  Please continue your efforts at being more active, low cholesterol diet, and weight control.  Please keep your appointments with your specialists as you may have planned  Please go to the LAB at the blood drawing area for the tests to be done  You will be contacted by phone if any changes need to be made immediately.  Otherwise, you will receive a letter about your results with an explanation, but please check with MyChart first.  Please remember to sign up for MyChart if you have not done so, as this will be important to you in the future with finding out test results, communicating by private email, and scheduling acute appointments online when needed.  Please make an Appointment to return in 6 months, or sooner if needed

## 2020-08-12 NOTE — Progress Notes (Deleted)
Patient ID: Deanna Wall, female   DOB: 11/17/1931, 87 y.o.   MRN: [id number]        Chief Complaint: follow up HTN, HLD and hyperglycemia ***       HPI:  Deanna Wall is a 87 y.o. female here with c/o         "patient suffers from femur fracture which impairs their ability to perform daily activities like all ADLs in the home. A cane or walker will not resolve issue with performing daily activities. A wheelchair will allow patient to safely perform daily activities. The patient can safely propel the wheelchair in the home."  Wt Readings from Last 3 Encounters:  05/13/20 203 lb 11.3 oz (92.4 kg)  05/10/19 250 lb (113.4 kg)  05/18/18 250 lb (113.4 kg)   BP Readings from Last 3 Encounters:  08/12/20 110/60  05/16/20 (!) 159/53  11/08/19 128/84         Past Medical History:  Diagnosis Date  . ANEMIA, PERNICIOUS 01/30/2009  . Cataracts, bilateral   . DEGENERATIVE DISC DISEASE 10/28/2006  . ECZEMA 10/28/2006  . Gait disorder   . Glaucoma   . HYPERLIPIDEMIA 02/16/2010  . HYPERTENSION 10/28/2006  . Impaired glucose tolerance 12/31/2010  . MS (multiple sclerosis) (HCC)   . Multiple sclerosis (HCC) 10/28/2006  . Obesity   . OSTEOARTHRITIS, KNEES, BILATERAL, SEVERE 06/14/2009  . Sebaceous cyst    chest   . VITAMIN B12 DEFICIENCY 06/14/2009   Past Surgical History:  Procedure Laterality Date  . COLONOSCOPY    . ORIF FEMUR FRACTURE Right 05/13/2020   Procedure: OPEN REDUCTION INTERNAL FIXATION (ORIF) DISTAL FEMUR FRACTURE;  Surgeon: Juanell Fairly, MD;  Location: [facility] ORS;  Service: Orthopedics;  Laterality: Right;  . TUBAL LIGATION  1958  . UPPER GASTROINTESTINAL ENDOSCOPY      reports that she has never smoked. She has never used smokeless tobacco. She reports that she does not drink alcohol and does not use drugs. family history includes Arthritis in her mother and sister; Bone cancer in her mother; Cancer in her mother; Diabetes in her sister. Allergies  Allergen Reactions  .  Atorvastatin     REACTION: feels funny in face and head   Current Outpatient Medications on File Prior to Visit  Medication Sig Dispense Refill  . amLODipine (NORVASC) 5 MG tablet Take 1 tablet by mouth once daily 90 tablet 0  . aspirin 81 MG tablet Take 81 mg by mouth daily.    . cholecalciferol (VITAMIN D3) 25 MCG (1000 UNIT) tablet Take 2,000 Units by mouth daily.    . citalopram (CELEXA) 10 MG tablet Take 1 tablet by mouth once daily 90 tablet 1  . cloNIDine (CATAPRES) 0.3 MG tablet Take 1 tablet by mouth twice daily 60 tablet 2  . diclofenac sodium (VOLTAREN) 1 % GEL Apply 2 g topically 4 (four) times daily as needed. 200 g 5  . ezetimibe (ZETIA) 10 MG tablet TAKE 1 TABLET BY MOUTH ONCE DAILY . APPOINTMENT REQUIRED FOR FUTURE REFILLS 30 tablet 0  . furosemide (LASIX) 20 MG tablet TAKE 1 TABLET BY MOUTH ONCE DAILY AS NEEDED FOR  SWELLING 90 tablet 1  . latanoprost (XALATAN) 0.005 % ophthalmic solution INSTILL 1 DROP IN BOTH EYES AT BEDTIME. KEEP THE BOTTLE IN THE FRIDGE UNTIL YOU OPEN IT.  3  . nitrofurantoin (MACRODANTIN) 50 MG capsule Take 1 capsule by mouth once daily 30 capsule 2  . polyethylene glycol powder (GLYCOLAX/MIRALAX) 17 GM/SCOOP powder Take 17  g by mouth 2 (two) times daily as needed. 3350 g 1  . potassium chloride (KLOR-CON) 10 MEQ tablet TAKE 1  BY MOUTH ONCE DAILY 30 tablet 0  . vitamin B-12 (CYANOCOBALAMIN) 500 MCG tablet Take 500 mcg by mouth daily.    Marland Kitchen enoxaparin (LOVENOX) 40 MG/0.4ML injection Inject 0.4 mLs (40 mg total) into the skin daily for 21 days. 8.4 mL 0   No current facility-administered medications on file prior to visit.        ROS:  All others reviewed and negative.  Objective        PE:  BP 110/60 (BP Location: Right Arm, Patient Position: Sitting, Cuff Size: Large)   Pulse (!) 45   Temp 97.7 F (36.5 C) (Oral)   Ht 5\' 7"  (1.702 m)   SpO2 97%   BMI 31.90 kg/m                 Constitutional: Pt appears in NAD               HENT: Head: NCAT.                 Right Ear: External ear normal.                 Left Ear: External ear normal.                Eyes: . Pupils are equal, round, and reactive to light. Conjunctivae and EOM are normal               Nose: without d/c or deformity               Neck: Neck supple. Gross normal ROM               Cardiovascular: Normal rate and regular rhythm.                 Pulmonary/Chest: Effort normal and breath sounds without rales or wheezing.                Abd:  Soft, NT, ND, + BS, no organomegaly               Neurological: Pt is alert. At baseline orientation, motor grossly intact               Skin: Skin is warm. No rashes, no other new lesions, LE edema - ***               Psychiatric: Pt behavior is normal without agitation   Micro: none  Cardiac tracings I have personally interpreted today:  none  Pertinent Radiological findings (summarize): none   Lab Results  Component Value Date   WBC 10.4 05/16/2020   HGB 8.7 (L) 05/16/2020   HCT 25.8 (L) 05/16/2020   PLT 273 05/16/2020   GLUCOSE 115 (H) 05/16/2020   CHOL 173 11/08/2019   TRIG 89 11/08/2019   HDL 51 11/08/2019   LDLDIRECT 180.0 01/29/2010   LDLCALC 104 (H) 11/08/2019   ALT 14 05/13/2020   AST 19 05/13/2020   NA 138 05/16/2020   K 4.1 05/16/2020   CL 105 05/16/2020   CREATININE 0.93 05/16/2020   BUN 17 05/16/2020   CO2 27 05/16/2020   TSH 3.42 05/10/2019   INR 1.2 05/13/2020   HGBA1C 6.3 (H) 11/08/2019   Assessment/Plan:  Deanna Wall is a 87 y.o. Black or African American [2] female with  has a past medical history  of ANEMIA, PERNICIOUS (01/30/2009), Cataracts, bilateral, DEGENERATIVE DISC DISEASE (10/28/2006), ECZEMA (10/28/2006), Gait disorder, Glaucoma, HYPERLIPIDEMIA (02/16/2010), HYPERTENSION (10/28/2006), Impaired glucose tolerance (12/31/2010), MS (multiple sclerosis) (HCC), Multiple sclerosis (HCC) (10/28/2006), Obesity, OSTEOARTHRITIS, KNEES, BILATERAL, SEVERE (06/14/2009), Sebaceous cyst, and VITAMIN B12  DEFICIENCY (06/14/2009).  No problem-specific Assessment & Plan notes found for this encounter.  Followup: No follow-ups on file.  Oliver Barre, MD 08/12/2020 11:30 AM [facility]  Primary Care - [facility] Internal Medicine

## 2020-08-13 ENCOUNTER — Encounter: Payer: Self-pay | Admitting: Internal Medicine

## 2020-08-13 LAB — URINE CULTURE

## 2020-08-19 ENCOUNTER — Encounter: Payer: Self-pay | Admitting: Internal Medicine

## 2020-08-19 ENCOUNTER — Other Ambulatory Visit: Payer: Self-pay | Admitting: Internal Medicine

## 2020-08-19 NOTE — Assessment & Plan Note (Signed)
To continue tx, for fu ua today if able

## 2020-08-19 NOTE — Assessment & Plan Note (Signed)
No overt bleeding post op, for f/u iron and cbc

## 2020-08-19 NOTE — Assessment & Plan Note (Signed)
D/w pt, declines DXA for now, cont PT and rx to [facility] for replacement wheelchair

## 2020-08-19 NOTE — Assessment & Plan Note (Signed)
Lab Results  Component Value Date   VITAMINB12 506 08/12/2020   Stable, cont oral replacement - b12 1000 mcg qd

## 2020-08-19 NOTE — Assessment & Plan Note (Signed)
BP Readings from Last 3 Encounters:  08/12/20 110/60  05/16/20 (!) 159/53  11/08/19 128/84   Stable, pt to continue medical treatment norvasc, catapress

## 2020-08-19 NOTE — Progress Notes (Signed)
Patient ID: Deanna Wall, female   DOB: 1931-10-22, 87 y.o.   MRN: [id number]         Chief Complaint:: wellness exam and Office Visit (Discuss wheelchair)  after recent right femur fracture, anemia       HPI:  Deanna Wall is a 87 y.o. female here for wellness exam; due for DXA after right femur fx but declines for now as too difficult to get done per pt;o/w up to date with preventive referrals and immunizations.                          Also having still significant debility and gait d/o, only yesterday was told she can start doing some weight bearing so today was first day she could present for face to face for getting a wheelchair for home. Patient suffers from femur fracture which impairs their ability to perform daily activities like all ADLs in the home. A cane or walker will not resolve issue with performing daily activities. A wheelchair will allow patient to safely perform daily activities. The patient can safely propel the wheelchair in the home.  Needs rx faxed to [facility].  Will continue PT but seems slow going to her.  No overt bleeding post surgury.  Pt denies chest pain, increased sob or doe, wheezing, orthopnea, PND, palpitations, dizziness or syncope.   Pt denies polydipsia, polyuria, or new focal neuro s/s.   Pt denies fever, wt loss, night sweats, loss of appetite, or other constitutional symptoms  No other new complaints  Wt Readings from Last 3 Encounters:  05/13/20 203 lb 11.3 oz (92.4 kg)  05/10/19 250 lb (113.4 kg)  05/18/18 250 lb (113.4 kg)   BP Readings from Last 3 Encounters:  08/12/20 110/60  05/16/20 (!) 159/53  11/08/19 128/84   Immunization History  Administered Date(s) Administered  . Influenza Split 12/31/2010, 12/07/2011  . Influenza Whole 03/23/2005, 12/09/2007, 12/17/2008, 02/04/2010  . Influenza, High Dose Seasonal PF 12/23/2016, 12/26/2018  . Influenza,inj,Quad PF,6+ Mos 12/15/2012, 11/09/2013, 12/25/2014, 11/13/2015, 01/14/2018  .  PFIZER(Purple Top)SARS-COV-2 Vaccination 04/12/2019, 05/03/2019  . Pneumococcal Conjugate-13 05/12/2013  . Pneumococcal Polysaccharide-23 03/23/2005, 12/31/2010  . Td 03/24/1999, 02/04/2010  . Zoster, Live 12/09/2007   Health Maintenance Due  Topic Date Due  . Zoster Vaccines- Shingrix (1 of 2) Never done      Past Medical History:  Diagnosis Date  . ANEMIA, PERNICIOUS 01/30/2009  . Cataracts, bilateral   . DEGENERATIVE DISC DISEASE 10/28/2006  . ECZEMA 10/28/2006  . Gait disorder   . Glaucoma   . HYPERLIPIDEMIA 02/16/2010  . HYPERTENSION 10/28/2006  . Impaired glucose tolerance 12/31/2010  . MS (multiple sclerosis) (HCC)   . Multiple sclerosis (HCC) 10/28/2006  . Obesity   . OSTEOARTHRITIS, KNEES, BILATERAL, SEVERE 06/14/2009  . Sebaceous cyst    chest   . VITAMIN B12 DEFICIENCY 06/14/2009   Past Surgical History:  Procedure Laterality Date  . COLONOSCOPY    . ORIF FEMUR FRACTURE Right 05/13/2020   Procedure: OPEN REDUCTION INTERNAL FIXATION (ORIF) DISTAL FEMUR FRACTURE;  Surgeon: Juanell Fairly, MD;  Location: [facility] ORS;  Service: Orthopedics;  Laterality: Right;  . TUBAL LIGATION  1958  . UPPER GASTROINTESTINAL ENDOSCOPY      reports that she has never smoked. She has never used smokeless tobacco. She reports that she does not drink alcohol and does not use drugs. family history includes Arthritis in her mother and sister; Bone cancer in her mother;  Cancer in her mother; Diabetes in her sister. Allergies  Allergen Reactions  . Atorvastatin     REACTION: feels funny in face and head   Current Outpatient Medications on File Prior to Visit  Medication Sig Dispense Refill  . amLODipine (NORVASC) 5 MG tablet Take 1 tablet by mouth once daily 90 tablet 0  . aspirin 81 MG tablet Take 81 mg by mouth daily.    . cholecalciferol (VITAMIN D3) 25 MCG (1000 UNIT) tablet Take 2,000 Units by mouth daily.    . citalopram (CELEXA) 10 MG tablet Take 1 tablet by mouth once daily 90 tablet 1   . cloNIDine (CATAPRES) 0.3 MG tablet Take 1 tablet by mouth twice daily 60 tablet 2  . diclofenac sodium (VOLTAREN) 1 % GEL Apply 2 g topically 4 (four) times daily as needed. 200 g 5  . ezetimibe (ZETIA) 10 MG tablet TAKE 1 TABLET BY MOUTH ONCE DAILY . APPOINTMENT REQUIRED FOR FUTURE REFILLS 30 tablet 0  . furosemide (LASIX) 20 MG tablet TAKE 1 TABLET BY MOUTH ONCE DAILY AS NEEDED FOR  SWELLING 90 tablet 1  . latanoprost (XALATAN) 0.005 % ophthalmic solution INSTILL 1 DROP IN BOTH EYES AT BEDTIME. KEEP THE BOTTLE IN THE FRIDGE UNTIL YOU OPEN IT.  3  . nitrofurantoin (MACRODANTIN) 50 MG capsule Take 1 capsule by mouth once daily 30 capsule 2  . polyethylene glycol powder (GLYCOLAX/MIRALAX) 17 GM/SCOOP powder Take 17 g by mouth 2 (two) times daily as needed. 3350 g 1  . potassium chloride (KLOR-CON) 10 MEQ tablet TAKE 1  BY MOUTH ONCE DAILY 30 tablet 0  . vitamin B-12 (CYANOCOBALAMIN) 500 MCG tablet Take 500 mcg by mouth daily.    Marland Kitchen enoxaparin (LOVENOX) 40 MG/0.4ML injection Inject 0.4 mLs (40 mg total) into the skin daily for 21 days. 8.4 mL 0   No current facility-administered medications on file prior to visit.        ROS:  All others reviewed and negative.  Objective        PE:  BP 110/60 (BP Location: Right Arm, Patient Position: Sitting, Cuff Size: Large)   Pulse (!) 45   Temp 97.7 F (36.5 C) (Oral)   Ht 5\' 7"  (1.702 m)   SpO2 97%   BMI 31.90 kg/m                 Constitutional: Pt appears in NAD in wheelchair               HENT: Head: NCAT.                Right Ear: External ear normal.                 Left Ear: External ear normal.                Eyes: . Pupils are equal, round, and reactive to light. Conjunctivae and EOM are normal               Nose: without d/c or deformity               Neck: Neck supple. Gross normal ROM               Cardiovascular: Normal rate and regular rhythm.                 Pulmonary/Chest: Effort normal and breath sounds without rales or  wheezing.  Abd:  Soft, NT, ND, + BS, no organomegaly               Neurological: Pt is alert. At baseline orientation, motor grossly intact               Skin: Skin is warm. No rashes, no other new lesions, LE edema - trace RLE               Psychiatric: Pt behavior is normal without agitation   Micro: none  Cardiac tracings I have personally interpreted today:  none  Pertinent Radiological findings (summarize): none   Lab Results  Component Value Date   WBC 6.3 08/12/2020   HGB 10.9 (L) 08/12/2020   HCT 33.1 (L) 08/12/2020   PLT 309.0 08/12/2020   GLUCOSE 99 08/12/2020   CHOL 170 08/12/2020   TRIG 85.0 08/12/2020   HDL 50.10 08/12/2020   LDLDIRECT 180.0 01/29/2010   LDLCALC 103 (H) 08/12/2020   ALT 9 08/12/2020   AST 12 08/12/2020   NA 142 08/12/2020   K 3.8 08/12/2020   CL 108 08/12/2020   CREATININE 0.77 08/12/2020   BUN 11 08/12/2020   CO2 28 08/12/2020   TSH 5.65 (H) 08/12/2020   INR 1.2 05/13/2020   HGBA1C 6.4 08/12/2020   Assessment/Plan:  ANTONISHA WASKEY is a 87 y.o. Black or African American [2] female with  has a past medical history of ANEMIA, PERNICIOUS (01/30/2009), Cataracts, bilateral, DEGENERATIVE DISC DISEASE (10/28/2006), ECZEMA (10/28/2006), Gait disorder, Glaucoma, HYPERLIPIDEMIA (02/16/2010), HYPERTENSION (10/28/2006), Impaired glucose tolerance (12/31/2010), MS (multiple sclerosis) (HCC), Multiple sclerosis (HCC) (10/28/2006), Obesity, OSTEOARTHRITIS, KNEES, BILATERAL, SEVERE (06/14/2009), Sebaceous cyst, and VITAMIN B12 DEFICIENCY (06/14/2009).  Encounter for well adult exam with abnormal findings Age and sex appropriate education and counseling updated with regular exercise and diet Referrals for preventative services - none needed Immunizations addressed - none needed Smoking counseling  - none needed Evidence for depression or other mood disorder - none significant Most recent labs reviewed. I have personally reviewed and have noted: 1) the  patient's medical and social history 2) The patient's current medications and supplements 3) The patient's height, weight, and BMI have been recorded in the chart   B12 deficiency Lab Results  Component Value Date   VITAMINB12 506 08/12/2020   Stable, cont oral replacement - b12 1000 mcg qd   Hyperlipidemia Lab Results  Component Value Date   LDLCALC 103 (H) 08/12/2020   Stable, pt to continue current zetia and lower chol diet, declines statin - did not tolerate lipitor previously   Iron deficiency anemia No overt bleeding post op, for f/u iron and cbc  Essential hypertension BP Readings from Last 3 Encounters:  08/12/20 110/60  05/16/20 (!) 159/53  11/08/19 128/84   Stable, pt to continue medical treatment norvasc, catapress   Impaired glucose tolerance Lab Results  Component Value Date   HGBA1C 6.4 08/12/2020   Stable, pt to continue current medical treatment  - diet   Recurrent UTI To continue tx, for fu ua today if able  Vitamin D deficiency Last vitamin D Lab Results  Component Value Date   VD25OH 29.32 (L) 08/12/2020   Low, to start oral replacement   Closed fracture of right distal femur (HCC) D/w pt, declines DXA for now, cont PT and rx to [facility] [place] for replacement wheelchair  Followup: Return in about 6 months (around 02/12/2021).  Oliver Barre, MD 08/19/2020 12:03 PM [facility]  Primary Care - [facility]  Internal Medicine

## 2020-08-19 NOTE — Assessment & Plan Note (Signed)
Lab Results  Component Value Date   LDLCALC 103 (H) 08/12/2020   Stable, pt to continue current zetia and lower chol diet, declines statin - did not tolerate lipitor previously

## 2020-08-19 NOTE — Telephone Encounter (Signed)
Please refill as per office routine med refill policy (all routine meds refilled for 3 mo or monthly per pt preference up to one year from last visit, then month to month grace period for 3 mo, then further med refills will have to be denied)  

## 2020-08-19 NOTE — Assessment & Plan Note (Signed)

## 2020-08-19 NOTE — Assessment & Plan Note (Signed)
Last vitamin D Lab Results  Component Value Date   VD25OH 29.32 (L) 08/12/2020   Low, to start oral replacement

## 2020-08-19 NOTE — Assessment & Plan Note (Signed)
Lab Results  Component Value Date   HGBA1C 6.4 08/12/2020   Stable, pt to continue current medical treatment  - diet

## 2020-08-20 ENCOUNTER — Other Ambulatory Visit: Payer: Self-pay | Admitting: Internal Medicine

## 2020-08-20 DIAGNOSIS — S72401D Unspecified fracture of lower end of right femur, subsequent encounter for closed fracture with routine healing: Secondary | ICD-10-CM

## 2020-08-20 DIAGNOSIS — G35 Multiple sclerosis: Secondary | ICD-10-CM

## 2020-08-20 DIAGNOSIS — R269 Unspecified abnormalities of gait and mobility: Secondary | ICD-10-CM

## 2020-08-21 ENCOUNTER — Other Ambulatory Visit: Payer: Self-pay | Admitting: Internal Medicine

## 2020-09-04 DIAGNOSIS — S72401D Unspecified fracture of lower end of right femur, subsequent encounter for closed fracture with routine healing: Secondary | ICD-10-CM | POA: Diagnosis not present

## 2020-09-04 DIAGNOSIS — M6281 Muscle weakness (generalized): Secondary | ICD-10-CM | POA: Diagnosis not present

## 2020-09-04 DIAGNOSIS — G35 Multiple sclerosis: Secondary | ICD-10-CM | POA: Diagnosis not present

## 2020-09-10 ENCOUNTER — Other Ambulatory Visit: Payer: Self-pay | Admitting: Internal Medicine

## 2020-09-10 NOTE — Telephone Encounter (Signed)
Please refill as per office routine med refill policy (all routine meds refilled for 3 mo or monthly per pt preference up to one year from last visit, then month to month grace period for 3 mo, then further med refills will have to be denied)  

## 2020-09-11 DIAGNOSIS — R269 Unspecified abnormalities of gait and mobility: Secondary | ICD-10-CM | POA: Diagnosis not present

## 2020-09-11 DIAGNOSIS — G35 Multiple sclerosis: Secondary | ICD-10-CM | POA: Diagnosis not present

## 2020-09-11 DIAGNOSIS — S72401D Unspecified fracture of lower end of right femur, subsequent encounter for closed fracture with routine healing: Secondary | ICD-10-CM | POA: Diagnosis not present

## 2020-09-25 ENCOUNTER — Other Ambulatory Visit: Payer: Self-pay | Admitting: Internal Medicine

## 2020-09-25 NOTE — Telephone Encounter (Signed)
Please refill as per office routine med refill policy (all routine meds refilled for 3 mo or monthly per pt preference up to one year from last visit, then month to month grace period for 3 mo, then further med refills will have to be denied)  

## 2020-09-27 DIAGNOSIS — S72401A Unspecified fracture of lower end of right femur, initial encounter for closed fracture: Secondary | ICD-10-CM | POA: Diagnosis not present

## 2020-10-02 ENCOUNTER — Ambulatory Visit (INDEPENDENT_AMBULATORY_CARE_PROVIDER_SITE_OTHER): Payer: Medicare Other

## 2020-10-02 ENCOUNTER — Other Ambulatory Visit: Payer: Self-pay

## 2020-10-02 VITALS — BP 130/80 | HR 48 | Temp 98.2°F | Ht 67.0 in

## 2020-10-02 DIAGNOSIS — Z0000 Encounter for general adult medical examination without abnormal findings: Secondary | ICD-10-CM

## 2020-10-02 NOTE — Progress Notes (Signed)
Subjective:   Deanna Wall is a 87 y.o. female who presents for Medicare Annual (Subsequent) preventive examination.  Review of Systems     Cardiac Risk Factors include: advanced age (>70men, >77 women);dyslipidemia;hypertension     Objective:    Today's Vitals   10/02/20 1219 10/02/20 1234  BP:  130/80  Pulse:  (!) 48  Temp:  98.2 F (36.8 C)  SpO2:  96%  Height:  5\' 7"  (1.702 m)  PainSc: 0-No pain 0-No pain   Body mass index is 31.9 kg/m.  Advanced Directives 10/02/2020 05/13/2020 05/12/2020 11/04/2016 04/10/2015 04/14/2011  Does Patient Have a Medical Advance Directive? Yes Yes No Yes Yes Patient has advance directive, copy not in chart  Type of Advance Directive Living will Living will - Healthcare Power of [place];Living will Living will -  Does patient want to make changes to medical advance directive? No - Patient declined No - Patient declined - - - -  Copy of Healthcare Power of Attorney in Chart? - - - No - copy requested No - copy requested -    Current Medications (verified) Outpatient Encounter Medications as of 10/02/2020  Medication Sig   amLODipine (NORVASC) 5 MG tablet Take 1 tablet by mouth once daily   furosemide (LASIX) 20 MG tablet TAKE 1 TABLET BY MOUTH ONCE DAILY AS NEEDED FOR  SWELLING   aspirin 81 MG tablet Take 81 mg by mouth daily.   cholecalciferol (VITAMIN D3) 25 MCG (1000 UNIT) tablet Take 2,000 Units by mouth daily.   citalopram (CELEXA) 10 MG tablet Take 1 tablet by mouth once daily   cloNIDine (CATAPRES) 0.3 MG tablet Take 1 tablet by mouth twice daily   diclofenac sodium (VOLTAREN) 1 % GEL Apply 2 g topically 4 (four) times daily as needed.   enoxaparin (LOVENOX) 40 MG/0.4ML injection Inject 0.4 mLs (40 mg total) into the skin daily for 21 days.   ezetimibe (ZETIA) 10 MG tablet TAKE 1 TABLET BY MOUTH ONCE DAILY   latanoprost (XALATAN) 0.005 % ophthalmic solution INSTILL 1 DROP IN BOTH EYES AT BEDTIME. KEEP THE BOTTLE IN THE FRIDGE UNTIL YOU  OPEN IT.   nitrofurantoin (MACRODANTIN) 50 MG capsule Take 1 capsule by mouth once daily   polyethylene glycol powder (GLYCOLAX/MIRALAX) 17 GM/SCOOP powder Take 17 g by mouth 2 (two) times daily as needed.   potassium chloride (KLOR-CON) 10 MEQ tablet Take 1 tablet by mouth once daily   vitamin B-12 (CYANOCOBALAMIN) 500 MCG tablet Take 500 mcg by mouth daily.   No facility-administered encounter medications on file as of 10/02/2020.    Allergies (verified) Atorvastatin   History: Past Medical History:  Diagnosis Date   ANEMIA, PERNICIOUS 01/30/2009   Cataracts, bilateral    DEGENERATIVE DISC DISEASE 10/28/2006   ECZEMA 10/28/2006   Gait disorder    Glaucoma    HYPERLIPIDEMIA 02/16/2010   HYPERTENSION 10/28/2006   Impaired glucose tolerance 12/31/2010   MS (multiple sclerosis) (HCC)    Multiple sclerosis (HCC) 10/28/2006   Obesity    OSTEOARTHRITIS, KNEES, BILATERAL, SEVERE 06/14/2009   Sebaceous cyst    chest    VITAMIN B12 DEFICIENCY 06/14/2009   Past Surgical History:  Procedure Laterality Date   COLONOSCOPY     ORIF FEMUR FRACTURE Right 05/13/2020   Procedure: OPEN REDUCTION INTERNAL FIXATION (ORIF) DISTAL FEMUR FRACTURE;  Surgeon: 05/15/2020, MD;  Location: [facility] ORS;  Service: Orthopedics;  Laterality: Right;   TUBAL LIGATION  1958   UPPER GASTROINTESTINAL ENDOSCOPY  Family History  Problem Relation Age of Onset   Bone cancer Mother    Arthritis Mother    Cancer Mother        bone   Diabetes Sister    Arthritis Sister    Social History   Socioeconomic History   Marital status: Widowed    Spouse name: Not on file   Number of children: 5   Years of education: 11TH    Highest education level: Not on file  Occupational History   Occupation: homemaker    Employer: RETIRED  Tobacco Use   Smoking status: Never   Smokeless tobacco: Never  Substance and Sexual Activity   Alcohol use: No   Drug use: No   Sexual activity: Not on file  Other Topics Concern    Not on file  Social History Narrative   Not on file   Social Determinants of Health   Financial Resource Strain: Low Risk    Difficulty of Paying Living Expenses: Not hard at all  Food Insecurity: No Food Insecurity   Worried About Programme researcher, broadcasting/film/video in the Last Year: Never true   Ran Out of Food in the Last Year: Never true  Transportation Needs: No Transportation Needs   Lack of Transportation (Medical): No   Lack of Transportation (Non-Medical): No  Physical Activity: Inactive   Days of Exercise per Week: 0 days   Minutes of Exercise per Session: 0 min  Stress: No Stress Concern Present   Feeling of Stress : Not at all  Social Connections: Moderately Integrated   Frequency of Communication with Friends and Family: More than three times a week   Frequency of Social Gatherings with Friends and Family: More than three times a week   Attends Religious Services: More than 4 times per year   Active Member of Clubs or Organizations: No   Attends Engineer, structural: More than 4 times per year   Marital Status: Widowed    Tobacco Counseling Counseling given: Not Answered   Clinical Intake:  Pre-visit preparation completed: Yes  Pain : No/denies pain Pain Score: 0-No pain     Nutritional Risks: None Diabetes: No  How often do you need to have someone help you when you read instructions, pamphlets, or other written materials from your doctor or pharmacy?: 1 - Never What is the last grade level you completed in school?: 11th grade  Diabetic? no  Interpreter Needed?: No  Information entered by :: Susie Cassette, LPN.   Activities of Daily Living In your present state of health, do you have any difficulty performing the following activities: 10/02/2020 05/13/2020  Hearing? N -  Vision? N -  Difficulty concentrating or making decisions? N -  Walking or climbing stairs? Y -  Dressing or bathing? Y -  Doing errands, shopping? [name]  Preparing Food and eating  ? N -  Using the Toilet? Y -  In the past six months, have you accidently leaked urine? Y -  Do you have problems with loss of bowel control? Y -  Managing your Medications? Y -  Managing your Finances? N -  Housekeeping or managing your Housekeeping? Y -  Some recent data might be hidden    Patient Care Team: Corwin Levins, MD as PCP - [facility] as Consulting Physician (Ophthalmology)  Indicate any recent Medical Services you may have received from other than [facility] providers in the past year (date may be approximate).  Assessment:   This is a routine wellness examination for Curlie.  Hearing/Vision screen Hearing Screening - Comments:: Patient denied any hearing difficulty. Vision Screening - Comments:: Patient wears glasses, has galucoma. Eye exams done every 3 months. Patient has not been able to be checked since her recent fall, where she had a leg fracture.  Dietary issues and exercise activities discussed: Current Exercise Habits: The patient does not participate in regular exercise at present, Exercise limited by: orthopedic condition(s);Other - see comments (Multiple sclerosis)   Goals Addressed               This Visit's Progress     DIET - INCREASE WATER INTAKE (pt-stated)        My goal is to get back to using my walker and increase my water intake.       Depression Screen PHQ 2/9 Scores 10/02/2020 08/12/2020 05/10/2019 05/10/2019 05/04/2018 05/06/2017 11/04/2016  PHQ - 2 Score 0 0 0 0 0 0 0    Fall Risk Fall Risk  10/02/2020 08/12/2020 05/10/2019 05/10/2019 05/04/2018  Falls in the past year? 1 1 0 0 0  Number falls in past yr: 0 0 - - -  Injury with Fall? 1 1 - - -  Risk for fall due to : - - - - -    FALL RISK PREVENTION PERTAINING TO THE HOME:  Any stairs in or around the home? No  If so, are there any without handrails? No  Home free of loose throw rugs in walkways, pet beds, electrical cords, etc? Yes  Adequate lighting in  your home to reduce risk of falls? Yes   ASSISTIVE DEVICES UTILIZED TO PREVENT FALLS:  Life alert? No  Use of a cane, walker or w/c? Yes  Grab bars in the bathroom? Yes  Shower chair or bench in shower? Yes  Elevated toilet seat or a handicapped toilet? Yes   TIMED UP AND GO:  Was the test performed? No .  Length of time to ambulate 10 feet: 0 sec.   Appearance of gait: Patient in wheelchair due to leg fracture.  Cognitive Function: Normal cognitive status assessed by direct observation by this Nurse Health Advisor. No abnormalities found.   MMSE - Mini Mental State Exam 11/04/2016  Orientation to time 5  Orientation to Place 5  Registration 3  Attention/ Calculation 5  Recall 2  Language- name 2 objects 2  Language- repeat 1  Language- follow 3 step command 3  Language- read & follow direction 1  Write a sentence 1  Copy design 1  Total score 29        Immunizations Immunization History  Administered Date(s) Administered   Influenza Split 12/31/2010, 12/07/2011   Influenza Whole 03/23/2005, 12/09/2007, 12/17/2008, 02/04/2010   Influenza, High Dose Seasonal PF 12/23/2016, 12/26/2018   Influenza,inj,Quad PF,6+ Mos 12/15/2012, 11/09/2013, 12/25/2014, 11/13/2015, 01/14/2018   Influenza-Unspecified 12/26/2019   PFIZER(Purple Top)SARS-COV-2 Vaccination 04/12/2019, 05/03/2019, 02/09/2020   Pneumococcal Conjugate-13 05/12/2013   Pneumococcal Polysaccharide-23 03/23/2005, 12/31/2010   Td 03/24/1999, 02/04/2010   Zoster, Live 12/09/2007    TDAP status: Due, Education has been provided regarding the importance of this vaccine. Advised may receive this vaccine at local pharmacy or Health Dept. Aware to provide a copy of the vaccination record if obtained from local pharmacy or Health Dept. Verbalized acceptance and understanding.  Flu Vaccine status: Up to date  Pneumococcal vaccine status: Up to date  Covid-19 vaccine status: Completed vaccines  Qualifies for Shingles  Vaccine? Yes  Zostavax completed Yes   Shingrix Completed?: No.    Education has been provided regarding the importance of this vaccine. Patient has been advised to call insurance company to determine out of pocket expense if they have not yet received this vaccine. Advised may also receive vaccine at local pharmacy or Health Dept. Verbalized acceptance and understanding.  Screening Tests Health Maintenance  Topic Date Due   Zoster Vaccines- Shingrix (1 of 2) Never done   COVID-19 Vaccine (4 - Booster for Pfizer series) 06/08/2020   DEXA SCAN  08/12/2021 (Originally 11/26/1998)   TETANUS/TDAP  08/12/2021 (Originally 02/05/2020)   INFLUENZA VACCINE  10/21/2020   PNA vac Low Risk Adult  Completed   HPV VACCINES  Aged Out    Health Maintenance  Health Maintenance Due  Topic Date Due   Zoster Vaccines- Shingrix (1 of 2) Never done   COVID-19 Vaccine (4 - Booster for Pfizer series) 06/08/2020    Colorectal cancer screening: No longer required.   Mammogram status: Completed 09/21/2019. Repeat every year  Bone Density status: never done  Lung Cancer Screening: (Low Dose CT Chest recommended if Age 60-80 years, 30 pack-year currently smoking OR have quit w/in 15years.) does not qualify.   Lung Cancer Screening Referral: no  Additional Screening:  Hepatitis C Screening: does not qualify; Completed no  Vision Screening: Recommended annual ophthalmology exams for early detection of glaucoma and other disorders of the eye. Is the patient up to date with their annual eye exam?  Yes  Who is the provider or what is the name of the office in which the patient attends annual eye exams? [facility] If pt is not established with a provider, would they like to be referred to a provider to establish care? No .   Dental Screening: Recommended annual dental exams for proper oral hygiene  Community Resource Referral / Chronic Care Management: CRR required this visit?  No   CCM required this  visit?  No      Plan:     I have personally reviewed and noted the following in the patient's chart:   Medical and social history Use of alcohol, tobacco or illicit drugs  Current medications and supplements including opioid prescriptions.  Functional ability and status Nutritional status Physical activity Advanced directives List of other physicians Hospitalizations, surgeries, and ER visits in previous 12 months Vitals Screenings to include cognitive, depression, and falls Referrals and appointments  In addition, I have reviewed and discussed with patient certain preventive protocols, quality metrics, and best practice recommendations. A written personalized care plan for preventive services as well as general preventive health recommendations were provided to patient.     Mickeal Needy, LPN   12/14/2681   Nurse Notes: n/a

## 2020-10-02 NOTE — Patient Instructions (Addendum)
Deanna Wall , Thank you for taking time to come for your Medicare Wellness Visit. I appreciate your ongoing commitment to your health goals. Please review the following plan we discussed and let me know if I can assist you in the future.   Screening recommendations/referrals: Colonoscopy: not recommended due to age Mammogram: last done 09/21/2019 Bone Density: never done Recommended yearly ophthalmology/optometry visit for glaucoma screening and checkup Recommended yearly dental visit for hygiene and checkup  Vaccinations: Influenza vaccine: due Fall 2022 Pneumococcal vaccine: 12/31/2010, 05/12/2013 Tdap vaccine: 02/04/2010; due every 10 years (overdue) Shingles vaccine: never done  Please call your insurance company to determine your out of pocket expense for the Shingrix vaccine. You may receive this vaccine at your local pharmacy. Covid-19: 04/12/2019, 05/03/2019, 02/09/2020  Advanced directives: Please bring a copy of your health care power of attorney and living will to the office at your convenience.  Conditions/risks identified: Yes; My goal is to get back to using my walker and increase my water intake.  Next appointment: Please schedule your next Medicare Wellness Visit with your Nurse Health Advisor in 1 year by calling [phone].   Preventive Care 85 Years and Older, Female Preventive care refers to lifestyle choices and visits with your health care provider that can promote health and wellness. What does preventive care include? A yearly physical exam. This is also called an annual well check. Dental exams once or twice a year. Routine eye exams. Ask your health care provider how often you should have your eyes checked. Personal lifestyle choices, including: Daily care of your teeth and gums. Regular physical activity. Eating a healthy diet. Avoiding tobacco and drug use. Limiting alcohol use. Practicing safe sex. Taking low-dose aspirin every day. Taking vitamin and  mineral supplements as recommended by your health care provider. What happens during an annual well check? The services and screenings done by your health care provider during your annual well check will depend on your age, overall health, lifestyle risk factors, and family history of disease. Counseling  Your health care provider may ask you questions about your: Alcohol use. Tobacco use. Drug use. Emotional well-being. Home and relationship well-being. Sexual activity. Eating habits. History of falls. Memory and ability to understand (cognition). Work and work Astronomer. Reproductive health. Screening  You may have the following tests or measurements: Height, weight, and BMI. Blood pressure. Lipid and cholesterol levels. These may be checked every 5 years, or more frequently if you are over 13 years old. Skin check. Lung cancer screening. You may have this screening every year starting at age 3 if you have a 30-pack-year history of smoking and currently smoke or have quit within the past 15 years. Fecal occult blood test (FOBT) of the stool. You may have this test every year starting at age 85. Flexible sigmoidoscopy or colonoscopy. You may have a sigmoidoscopy every 5 years or a colonoscopy every 10 years starting at age 8. Hepatitis C blood test. Hepatitis B blood test. Sexually transmitted disease (STD) testing. Diabetes screening. This is done by checking your blood sugar (glucose) after you have not eaten for a while (fasting). You may have this done every 1-3 years. Bone density scan. This is done to screen for osteoporosis. You may have this done starting at age 70. Mammogram. This may be done every 1-2 years. Talk to your health care provider about how often you should have regular mammograms. Talk with your health care provider about your test results, treatment options, and if necessary, the  need for more tests. Vaccines  Your health care provider may recommend  certain vaccines, such as: Influenza vaccine. This is recommended every year. Tetanus, diphtheria, and acellular pertussis (Tdap, Td) vaccine. You may need a Td booster every 10 years. Zoster vaccine. You may need this after age 22. Pneumococcal 13-valent conjugate (PCV13) vaccine. One dose is recommended after age 2. Pneumococcal polysaccharide (PPSV23) vaccine. One dose is recommended after age 76. Talk to your health care provider about which screenings and vaccines you need and how often you need them. This information is not intended to replace advice given to you by your health care provider. Make sure you discuss any questions you have with your health care provider. Document Released: 04/05/2015 Document Revised: 11/27/2015 Document Reviewed: 01/08/2015 Elsevier Interactive Patient Education  2017 [place] Prevention in the Home Falls can cause injuries. They can happen to people of all ages. There are many things you can do to make your home safe and to help prevent falls. What can I do on the outside of my home? Regularly fix the edges of walkways and driveways and fix any cracks. Remove anything that might make you trip as you walk through a door, such as a raised step or threshold. Trim any bushes or trees on the path to your home. Use bright outdoor lighting. Clear any walking paths of anything that might make someone trip, such as rocks or tools. Regularly check to see if handrails are loose or broken. Make sure that both sides of any steps have handrails. Any raised decks and porches should have guardrails on the edges. Have any leaves, snow, or ice cleared regularly. Use sand or salt on walking paths during winter. Clean up any spills in your garage right away. This includes oil or grease spills. What can I do in the bathroom? Use night lights. Install grab bars by the toilet and in the tub and shower. Do not use towel bars as grab bars. Use non-skid mats or  decals in the tub or shower. If you need to sit down in the shower, use a plastic, non-slip stool. Keep the floor dry. Clean up any water that spills on the floor as soon as it happens. Remove soap buildup in the tub or shower regularly. Attach bath mats securely with double-sided non-slip rug tape. Do not have throw rugs and other things on the floor that can make you trip. What can I do in the bedroom? Use night lights. Make sure that you have a light by your bed that is easy to reach. Do not use any sheets or blankets that are too big for your bed. They should not hang down onto the floor. Have a firm chair that has side arms. You can use this for support while you get dressed. Do not have throw rugs and other things on the floor that can make you trip. What can I do in the kitchen? Clean up any spills right away. Avoid walking on wet floors. Keep items that you use a lot in easy-to-reach places. If you need to reach something above you, use a strong step stool that has a grab bar. Keep electrical cords out of the way. Do not use floor polish or wax that makes floors slippery. If you must use wax, use non-skid floor wax. Do not have throw rugs and other things on the floor that can make you trip. What can I do with my stairs? Do not leave any items on the  stairs. Make sure that there are handrails on both sides of the stairs and use them. Fix handrails that are broken or loose. Make sure that handrails are as long as the stairways. Check any carpeting to make sure that it is firmly attached to the stairs. Fix any carpet that is loose or worn. Avoid having throw rugs at the top or bottom of the stairs. If you do have throw rugs, attach them to the floor with carpet tape. Make sure that you have a light switch at the top of the stairs and the bottom of the stairs. If you do not have them, ask someone to add them for you. What else can I do to help prevent falls? Wear shoes that: Do not  have high heels. Have rubber bottoms. Are comfortable and fit you well. Are closed at the toe. Do not wear sandals. If you use a stepladder: Make sure that it is fully opened. Do not climb a closed stepladder. Make sure that both sides of the stepladder are locked into place. Ask someone to hold it for you, if possible. Clearly mark and make sure that you can see: Any grab bars or handrails. First and last steps. Where the edge of each step is. Use tools that help you move around (mobility aids) if they are needed. These include: Canes. Walkers. Scooters. Crutches. Turn on the lights when you go into a dark area. Replace any light bulbs as soon as they burn out. Set up your furniture so you have a clear path. Avoid moving your furniture around. If any of your floors are uneven, fix them. If there are any pets around you, be aware of where they are. Review your medicines with your doctor. Some medicines can make you feel dizzy. This can increase your chance of falling. Ask your doctor what other things that you can do to help prevent falls. This information is not intended to replace advice given to you by your health care provider. Make sure you discuss any questions you have with your health care provider. Document Released: 01/03/2009 Document Revised: 08/15/2015 Document Reviewed: 04/13/2014 Elsevier Interactive Patient Education  2017 [employer].

## 2020-10-04 DIAGNOSIS — S72401D Unspecified fracture of lower end of right femur, subsequent encounter for closed fracture with routine healing: Secondary | ICD-10-CM | POA: Diagnosis not present

## 2020-10-04 DIAGNOSIS — M6281 Muscle weakness (generalized): Secondary | ICD-10-CM | POA: Diagnosis not present

## 2020-10-04 DIAGNOSIS — G35 Multiple sclerosis: Secondary | ICD-10-CM | POA: Diagnosis not present

## 2020-10-11 DIAGNOSIS — S72401D Unspecified fracture of lower end of right femur, subsequent encounter for closed fracture with routine healing: Secondary | ICD-10-CM | POA: Diagnosis not present

## 2020-10-11 DIAGNOSIS — R269 Unspecified abnormalities of gait and mobility: Secondary | ICD-10-CM | POA: Diagnosis not present

## 2020-10-11 DIAGNOSIS — G35 Multiple sclerosis: Secondary | ICD-10-CM | POA: Diagnosis not present

## 2020-11-04 DIAGNOSIS — G35 Multiple sclerosis: Secondary | ICD-10-CM | POA: Diagnosis not present

## 2020-11-04 DIAGNOSIS — M6281 Muscle weakness (generalized): Secondary | ICD-10-CM | POA: Diagnosis not present

## 2020-11-04 DIAGNOSIS — S72401D Unspecified fracture of lower end of right femur, subsequent encounter for closed fracture with routine healing: Secondary | ICD-10-CM | POA: Diagnosis not present

## 2020-11-11 ENCOUNTER — Other Ambulatory Visit: Payer: Self-pay | Admitting: Internal Medicine

## 2020-11-11 DIAGNOSIS — S72401D Unspecified fracture of lower end of right femur, subsequent encounter for closed fracture with routine healing: Secondary | ICD-10-CM | POA: Diagnosis not present

## 2020-11-11 DIAGNOSIS — G35 Multiple sclerosis: Secondary | ICD-10-CM | POA: Diagnosis not present

## 2020-11-11 DIAGNOSIS — R269 Unspecified abnormalities of gait and mobility: Secondary | ICD-10-CM | POA: Diagnosis not present

## 2020-11-11 NOTE — Telephone Encounter (Signed)
Please refill as per office routine med refill policy (all routine meds refilled for 3 mo or monthly per pt preference up to one year from last visit, then month to month grace period for 3 mo, then further med refills will have to be denied)  

## 2020-11-28 DIAGNOSIS — H35033 Hypertensive retinopathy, bilateral: Secondary | ICD-10-CM | POA: Diagnosis not present

## 2020-11-28 DIAGNOSIS — H401232 Low-tension glaucoma, bilateral, moderate stage: Secondary | ICD-10-CM | POA: Diagnosis not present

## 2020-11-28 DIAGNOSIS — H02831 Dermatochalasis of right upper eyelid: Secondary | ICD-10-CM | POA: Diagnosis not present

## 2020-11-28 DIAGNOSIS — H04123 Dry eye syndrome of bilateral lacrimal glands: Secondary | ICD-10-CM | POA: Diagnosis not present

## 2020-12-02 ENCOUNTER — Other Ambulatory Visit: Payer: Self-pay | Admitting: Internal Medicine

## 2020-12-04 ENCOUNTER — Other Ambulatory Visit: Payer: Self-pay | Admitting: Internal Medicine

## 2020-12-04 NOTE — Telephone Encounter (Signed)
Please refill as per office routine med refill policy (all routine meds to be refilled for 3 mo or monthly (per pt preference) up to one year from last visit, then month to month grace period for 3 mo, then further med refills will have to be denied) ? ?

## 2020-12-05 DIAGNOSIS — G35 Multiple sclerosis: Secondary | ICD-10-CM | POA: Diagnosis not present

## 2020-12-05 DIAGNOSIS — M6281 Muscle weakness (generalized): Secondary | ICD-10-CM | POA: Diagnosis not present

## 2020-12-05 DIAGNOSIS — S72401D Unspecified fracture of lower end of right femur, subsequent encounter for closed fracture with routine healing: Secondary | ICD-10-CM | POA: Diagnosis not present

## 2020-12-12 DIAGNOSIS — S72401D Unspecified fracture of lower end of right femur, subsequent encounter for closed fracture with routine healing: Secondary | ICD-10-CM | POA: Diagnosis not present

## 2020-12-12 DIAGNOSIS — R269 Unspecified abnormalities of gait and mobility: Secondary | ICD-10-CM | POA: Diagnosis not present

## 2020-12-12 DIAGNOSIS — G35 Multiple sclerosis: Secondary | ICD-10-CM | POA: Diagnosis not present

## 2020-12-24 ENCOUNTER — Other Ambulatory Visit: Payer: Self-pay

## 2020-12-24 NOTE — Patient Outreach (Signed)
Aging Gracefully Program  12/24/2020  Deanna Wall 11/17/1931 [id number]  [facility] Evaluation Interviewer made contact with patient. Aging Gracefully survey completed.   Interviewer will send referral to Rowe Pavy, RN and OT for follow up.   [facility] Care Management Assistant  [phone]

## 2021-01-04 DIAGNOSIS — S72401D Unspecified fracture of lower end of right femur, subsequent encounter for closed fracture with routine healing: Secondary | ICD-10-CM | POA: Diagnosis not present

## 2021-01-04 DIAGNOSIS — M6281 Muscle weakness (generalized): Secondary | ICD-10-CM | POA: Diagnosis not present

## 2021-01-04 DIAGNOSIS — G35 Multiple sclerosis: Secondary | ICD-10-CM | POA: Diagnosis not present

## 2021-01-10 ENCOUNTER — Other Ambulatory Visit: Payer: Self-pay | Admitting: Internal Medicine

## 2021-01-10 NOTE — Telephone Encounter (Signed)
Please refill as per office routine med refill policy (all routine meds to be refilled for 3 mo or monthly (per pt preference) up to one year from last visit, then month to month grace period for 3 mo, then further med refills will have to be denied) ? ?

## 2021-01-11 DIAGNOSIS — R269 Unspecified abnormalities of gait and mobility: Secondary | ICD-10-CM | POA: Diagnosis not present

## 2021-01-11 DIAGNOSIS — S72401D Unspecified fracture of lower end of right femur, subsequent encounter for closed fracture with routine healing: Secondary | ICD-10-CM | POA: Diagnosis not present

## 2021-01-11 DIAGNOSIS — G35 Multiple sclerosis: Secondary | ICD-10-CM | POA: Diagnosis not present

## 2021-01-17 ENCOUNTER — Other Ambulatory Visit: Payer: Self-pay | Admitting: Internal Medicine

## 2021-01-17 NOTE — Telephone Encounter (Signed)
Please refill as per office routine med refill policy (all routine meds to be refilled for 3 mo or monthly (per pt preference) up to one year from last visit, then month to month grace period for 3 mo, then further med refills will have to be denied) ? ?

## 2021-01-28 DIAGNOSIS — H401232 Low-tension glaucoma, bilateral, moderate stage: Secondary | ICD-10-CM | POA: Diagnosis not present

## 2021-01-28 DIAGNOSIS — H04123 Dry eye syndrome of bilateral lacrimal glands: Secondary | ICD-10-CM | POA: Diagnosis not present

## 2021-01-28 DIAGNOSIS — H02834 Dermatochalasis of left upper eyelid: Secondary | ICD-10-CM | POA: Diagnosis not present

## 2021-01-28 DIAGNOSIS — H02831 Dermatochalasis of right upper eyelid: Secondary | ICD-10-CM | POA: Diagnosis not present

## 2021-02-04 ENCOUNTER — Other Ambulatory Visit: Payer: Self-pay

## 2021-02-04 ENCOUNTER — Other Ambulatory Visit: Payer: Self-pay | Admitting: Occupational Therapy

## 2021-02-04 DIAGNOSIS — M6281 Muscle weakness (generalized): Secondary | ICD-10-CM | POA: Diagnosis not present

## 2021-02-04 DIAGNOSIS — G35 Multiple sclerosis: Secondary | ICD-10-CM | POA: Diagnosis not present

## 2021-02-04 DIAGNOSIS — S72401D Unspecified fracture of lower end of right femur, subsequent encounter for closed fracture with routine healing: Secondary | ICD-10-CM | POA: Diagnosis not present

## 2021-02-04 NOTE — Patient Outreach (Signed)
Aging Gracefully Program  OT Initial Visit  02/04/2021  Deanna Wall 04/20/1931 [id number]  Visit:  1- Initial Visit  Start Time:  0910 End Time:  1015 Total Minutes:  65  CCAP: Typical Daily Routine: Typical Daily Routine:: Gets up, baths, dresses, fixes breakfast, watches TV, sometimes goes out with dtrs What Types Of Care Problems Are You Having Throughout The Day?: A with toileting What Kind Of Help Do You Receive?: Assist from dtrs for toileting and some IADLs Do You Think You Need Other Types Of Help?: No What Do You Think Would Make Everyday Life Easier For You?: Permanant ramp at front. Maybe a walk in shower, maybe a pedistal sink to have more room in bathoorm, maybe widening bathroom and bedroom doors, securing 3n1 so it wont move Patient Reported Equipment: Patient Reported Equipment Currently Used: Chair Lift, Side Rails On Bed, Wheelchair (hosptial bed) Functional Mobility-Reaching For Items Above Shoulder Level: Reaching For Items Above Shoulder Level:  (Uses reacher since she is in a wheelchair, no issues with shoulder AROM) Functional Mobility-Move In And Out Of Bath/Shower: Move In And Out Of A Bath/Shower: Unable To Do Do You:: N/A- Not Applicable Importance Of Learning New Strategies:: A Little Other Comments:: she currently has a tub/shower combo, but can't ge to tub due to space in bathroom. Activities of Daily Living-Put On And Take Off Socks And Shoes: Put On And Take Off Socks And  Shoes:  (Can manage but is also interested in seeing a sock aid)  Readiness To Change Score:  Readiness to Change Score: 10  Home Environment Assessment: Outside Home Entry:: Temporary ramp by CHS Bathroom:: Bathroom is small. Has a tub/shower combination. Door is a bit narrow for her wheelchair Master Bedroom:: Bedroom door is a bit narrow for her wheelchair  Patient Education: Education Provided: Yes Education Details: Safety at home Consulting civil engineer) Educated:  Patient, Child(ren) Comprehension:  (agreed to review and write down any questions for our next meeting)  Goals:  Goals Addressed             This Visit's Progress    Patient Stated       She would be interested in trying a sock aid to see if this made getting socks on easier.     Patient Stated       She would like to be able to get in and out of her house by herself (permanent ramp with upper deck area would help this)     Patient Stated       She would like to consider a walk in shower and pedestal sink, maybe even a round instead of elongated toilet to give her more room for wheelchair in her bathroom. May need to find a way to secure 3n1 in bathroom or bedroom so it does not shift/tilt on her.     Patient Stated       She would like to consider widening bathroom and master bedroom doorways to make it easier to get in and out with wheelchair.        Post Clinical Reasoning: Clinician View Of Client Situation:: Ms. Pippins does most of her bathing, dressing, and cooking. She gets assistance with toileting and part of laundry from her daughters. She gets around in a wheelchair since she broke her femur early this year. She does lateral transfers from surface to surface. She likes to watch TV and go out with her daughters.She is currently living with one of her daughters due  to she had a water leak at her house and insurance is in the process of getting if fixed. Client View Of His/Her Situation:: Ms. Malak feels like she does really well for herself and she definitely does. She manages almost all of her basic ADLs and some of her IADLs. Next Visit Plan:: Sock and aide and discuss safety at home checklist.  Ignacia Palma, OTR/L Aging Gracefully [phone]

## 2021-02-04 NOTE — Patient Instructions (Signed)
Goals Addressed             This Visit's Progress    Patient Stated       She would be interested in trying a sock aid to see if this made getting socks on easier.     Patient Stated       She would like to be able to get in and out of her house by herself (permanent ramp with upper deck area would help this)     Patient Stated       She would like to consider a walk in shower and pedestal sink, maybe even a round instead of elongated toilet to give her more room for wheelchair in her bathroom. May need to find a way to secure 3n1 in bathroom or bedroom so it does not shift/tilt on her.     Patient Stated       She would like to consider widening bathroom and master bedroom doorways to make it easier to get in and out with wheelchair.

## 2021-02-05 ENCOUNTER — Other Ambulatory Visit: Payer: Self-pay

## 2021-02-05 NOTE — Patient Outreach (Signed)
Aging Gracefully Program  02/05/2021  Deanna Wall Dec 31, 1931 [id number]   Placed call to patient to schedule home visit. No answer. Left a message for patient to return my call.  Rowe Pavy RN, BSN, Careers adviser for [employer] Mobile: [phone]

## 2021-02-11 DIAGNOSIS — S72401D Unspecified fracture of lower end of right femur, subsequent encounter for closed fracture with routine healing: Secondary | ICD-10-CM | POA: Diagnosis not present

## 2021-02-11 DIAGNOSIS — G35 Multiple sclerosis: Secondary | ICD-10-CM | POA: Diagnosis not present

## 2021-02-11 DIAGNOSIS — R269 Unspecified abnormalities of gait and mobility: Secondary | ICD-10-CM | POA: Diagnosis not present

## 2021-02-12 ENCOUNTER — Ambulatory Visit: Payer: Medicare Other | Admitting: Internal Medicine

## 2021-02-12 ENCOUNTER — Other Ambulatory Visit: Payer: Self-pay | Admitting: Internal Medicine

## 2021-02-17 ENCOUNTER — Other Ambulatory Visit: Payer: Self-pay

## 2021-02-17 ENCOUNTER — Encounter: Payer: Self-pay | Admitting: Internal Medicine

## 2021-02-17 ENCOUNTER — Ambulatory Visit (INDEPENDENT_AMBULATORY_CARE_PROVIDER_SITE_OTHER): Payer: Medicare Other | Admitting: Internal Medicine

## 2021-02-17 VITALS — BP 132/70 | HR 47 | Temp 97.8°F | Ht 67.0 in

## 2021-02-17 DIAGNOSIS — M19042 Primary osteoarthritis, left hand: Secondary | ICD-10-CM

## 2021-02-17 DIAGNOSIS — E559 Vitamin D deficiency, unspecified: Secondary | ICD-10-CM

## 2021-02-17 DIAGNOSIS — M19041 Primary osteoarthritis, right hand: Secondary | ICD-10-CM

## 2021-02-17 DIAGNOSIS — E538 Deficiency of other specified B group vitamins: Secondary | ICD-10-CM

## 2021-02-17 DIAGNOSIS — E782 Mixed hyperlipidemia: Secondary | ICD-10-CM | POA: Diagnosis not present

## 2021-02-17 DIAGNOSIS — I10 Essential (primary) hypertension: Secondary | ICD-10-CM

## 2021-02-17 DIAGNOSIS — Z23 Encounter for immunization: Secondary | ICD-10-CM

## 2021-02-17 DIAGNOSIS — R7302 Impaired glucose tolerance (oral): Secondary | ICD-10-CM

## 2021-02-17 NOTE — Assessment & Plan Note (Signed)
Last vitamin D Lab Results  Component Value Date   VD25OH 29.32 (L) 08/12/2020   Low, to start oral replacement

## 2021-02-17 NOTE — Progress Notes (Signed)
Patient ID: Deanna Wall, female   DOB: [birth year], 87 y.o.   MRN: [id number]        Chief Complaint: follow up HTN, HLD and hyperglycemia, low vit d       HPI:  Deanna Wall is a 87 y.o. female here overall doing well, Pt denies chest pain, increased sob or doe, wheezing, orthopnea, PND, increased LE swelling, palpitations, dizziness or syncope.   Pt denies polydipsia, polyuria, or new focal neuro s/s.  Remains wheelchair bound but can stand briefly at home  Pt denies fever, wt loss, night sweats, loss of appetite, or other constitutional symptoms        Not taking Vit D.  No other new complaints.  Daughter now lives with her, and here today.  Has ongoing hand arthritis pain , not sure about what to do about it, chronic mild, dull, using the hands makes worse, resting makes better but still aches Wt Readings from Last 3 Encounters:  05/13/20 203 lb 11.3 oz (92.4 kg)  05/10/19 250 lb (113.4 kg)  05/18/18 250 lb (113.4 kg)   BP Readings from Last 3 Encounters:  02/17/21 132/70  10/02/20 130/80  08/12/20 110/60         Past Medical History:  Diagnosis Date   ANEMIA, PERNICIOUS 01/30/2009   Cataracts, bilateral    DEGENERATIVE DISC DISEASE 10/28/2006   ECZEMA 10/28/2006   Gait disorder    Glaucoma    HYPERLIPIDEMIA 02/16/2010   HYPERTENSION 10/28/2006   Impaired glucose tolerance 12/31/2010   MS (multiple sclerosis) (HCC)    Multiple sclerosis (HCC) 10/28/2006   Obesity    OSTEOARTHRITIS, KNEES, BILATERAL, SEVERE 06/14/2009   Sebaceous cyst    chest    VITAMIN B12 DEFICIENCY 06/14/2009   Past Surgical History:  Procedure Laterality Date   COLONOSCOPY     ORIF FEMUR FRACTURE Right 05/13/2020   Procedure: OPEN REDUCTION INTERNAL FIXATION (ORIF) DISTAL FEMUR FRACTURE;  Surgeon: Juanell Fairly, MD;  Location: [facility] ORS;  Service: Orthopedics;  Laterality: Right;   TUBAL LIGATION  1958   UPPER GASTROINTESTINAL ENDOSCOPY      reports that she has never smoked. She has never used  smokeless tobacco. She reports that she does not drink alcohol and does not use drugs. family history includes Arthritis in her mother and sister; Bone cancer in her mother; Cancer in her mother; Diabetes in her sister. Allergies  Allergen Reactions   Atorvastatin     REACTION: feels funny in face and head   Current Outpatient Medications on File Prior to Visit  Medication Sig Dispense Refill   amLODipine (NORVASC) 5 MG tablet Take 1 tablet by mouth once daily 90 tablet 1   aspirin 81 MG tablet Take 81 mg by mouth daily.     cholecalciferol (VITAMIN D3) 25 MCG (1000 UNIT) tablet Take 2,000 Units by mouth daily.     citalopram (CELEXA) 10 MG tablet Take 1 tablet by mouth once daily 90 tablet 1   cloNIDine (CATAPRES) 0.3 MG tablet Take 1 tablet by mouth twice daily 60 tablet 0   diclofenac sodium (VOLTAREN) 1 % GEL Apply 2 g topically 4 (four) times daily as needed. 200 g 5   ezetimibe (ZETIA) 10 MG tablet TAKE 1 TABLET BY MOUTH ONCE DAILY 90 tablet 1   furosemide (LASIX) 20 MG tablet TAKE 1 TABLET BY MOUTH ONCE DAILY AS NEEDED FOR  SWELLING 90 tablet 0   latanoprost (XALATAN) 0.005 % ophthalmic solution INSTILL 1 DROP IN  BOTH EYES AT BEDTIME. KEEP THE BOTTLE IN THE FRIDGE UNTIL YOU OPEN IT.  3   nitrofurantoin (MACRODANTIN) 50 MG capsule Take 1 capsule by mouth once daily 30 capsule 5   polyethylene glycol powder (GLYCOLAX/MIRALAX) 17 GM/SCOOP powder Take 17 g by mouth 2 (two) times daily as needed. 3350 g 1   potassium chloride (KLOR-CON) 10 MEQ tablet Take 1 tablet by mouth once daily 90 tablet 1   vitamin B-12 (CYANOCOBALAMIN) 500 MCG tablet Take 500 mcg by mouth daily.     enoxaparin (LOVENOX) 40 MG/0.4ML injection Inject 0.4 mLs (40 mg total) into the skin daily for 21 days. 8.4 mL 0   No current facility-administered medications on file prior to visit.        ROS:  All others reviewed and negative.  Objective        PE:  BP 132/70 (BP Location: Right Arm, Patient Position: Sitting,  Cuff Size: Large)   Pulse (!) 47   Temp 97.8 F (36.6 C) (Oral)   Ht 5\' 7"  (1.702 m)   SpO2 96%   BMI 31.90 kg/m                 Constitutional: Pt appears in NAD               HENT: Head: NCAT.                Right Ear: External ear normal.                 Left Ear: External ear normal.                Eyes: . Pupils are equal, round, and reactive to light. Conjunctivae and EOM are normal               Nose: without d/c or deformity               Neck: Neck supple. Gross normal ROM               Cardiovascular: Normal rate and regular rhythm.                 Pulmonary/Chest: Effort normal and breath sounds without rales or wheezing.                Abd:  Soft, NT, ND, + BS, no organomegaly               Neurological: Pt is alert. At baseline orientation, motor grossly intact               Skin: Skin is warm. No rashes, no other new lesions, LE edema - trae bilateral               Psychiatric: Pt behavior is normal without agitation   Micro: none  Cardiac tracings I have personally interpreted today:  none  Pertinent Radiological findings (summarize): none   Lab Results  Component Value Date   WBC 6.3 08/12/2020   HGB 10.9 (L) 08/12/2020   HCT 33.1 (L) 08/12/2020   PLT 309.0 08/12/2020   GLUCOSE 99 08/12/2020   CHOL 170 08/12/2020   TRIG 85.0 08/12/2020   HDL 50.10 08/12/2020   LDLDIRECT 180.0 01/29/2010   LDLCALC 103 (H) 08/12/2020   ALT 9 08/12/2020   AST 12 08/12/2020   NA 142 08/12/2020   K 3.8 08/12/2020   CL 108 08/12/2020   CREATININE 0.77 08/12/2020   BUN 11 08/12/2020  CO2 28 08/12/2020   TSH 5.65 (H) 08/12/2020   INR 1.2 05/13/2020   HGBA1C 6.4 08/12/2020   Assessment/Plan:  Deanna Wall is a 87 y.o. Black or African American [2] female with  has a past medical history of ANEMIA, PERNICIOUS (01/30/2009), Cataracts, bilateral, DEGENERATIVE DISC DISEASE (10/28/2006), ECZEMA (10/28/2006), Gait disorder, Glaucoma, HYPERLIPIDEMIA (02/16/2010), HYPERTENSION  (10/28/2006), Impaired glucose tolerance (12/31/2010), MS (multiple sclerosis) (HCC), Multiple sclerosis (HCC) (10/28/2006), Obesity, OSTEOARTHRITIS, KNEES, BILATERAL, SEVERE (06/14/2009), Sebaceous cyst, and VITAMIN B12 DEFICIENCY (06/14/2009).  Vitamin D deficiency Last vitamin D Lab Results  Component Value Date   VD25OH 29.32 (L) 08/12/2020   Low, to start oral replacement   B12 deficiency Lab Results  Component Value Date   VITAMINB12 506 08/12/2020   Stable, cont oral replacement - b12 1000 mcg qd   Hyperlipidemia Lab Results  Component Value Date   LDLCALC 103 (H) 08/12/2020   Mild uncontrolled, goal ldl < 100, pt to continue current zetia, declines statin   Essential hypertension BP Readings from Last 3 Encounters:  02/17/21 132/70  10/02/20 130/80  08/12/20 110/60   Stable, pt to continue medical treatment norvasc, catapres   Impaired glucose tolerance Lab Results  Component Value Date   HGBA1C 6.4 08/12/2020   Stable, pt to continue current medical treatment  - diet   Arthritis of hand, degenerative With mild recent worsening, for otc volt gel prn,  to f/u any worsening symptoms or concerns Followup: Return in about 6 months (around 08/17/2021).  Oliver Barre, MD 02/23/2021 8:21 AM [facility]

## 2021-02-17 NOTE — Patient Outreach (Signed)
Aging Gracefully Program  02/17/2021  Deanna Wall [birth year] [id number]   Placed call to patient in attempt to schedule initial home visit. No answer. Left a message requesting a call back.  Rowe Pavy RN, BSN, Careers adviser for [employer] Mobile: [phone]

## 2021-02-17 NOTE — Patient Instructions (Addendum)
Please take OTC Vitamin D3 at 2000 units per day, indefinitely  You had flu shot today  Please continue all other medications as before, and refills have been done if requested.  Please have the pharmacy call with any other refills you may need.  Please continue your efforts at being more active, low cholesterol diet, and weight control.  Please keep your appointments with your specialists as you may have planned  Please make an Appointment to return in 6 months, or sooner if needed

## 2021-02-23 ENCOUNTER — Encounter: Payer: Self-pay | Admitting: Internal Medicine

## 2021-02-23 NOTE — Assessment & Plan Note (Signed)
BP Readings from Last 3 Encounters:  02/17/21 132/70  10/02/20 130/80  08/12/20 110/60   Stable, pt to continue medical treatment norvasc, catapres

## 2021-02-23 NOTE — Assessment & Plan Note (Signed)
With mild recent worsening, for otc volt gel prn,  to f/u any worsening symptoms or concerns

## 2021-02-23 NOTE — Assessment & Plan Note (Signed)
Lab Results  Component Value Date   LDLCALC 103 (H) 08/12/2020   Mild uncontrolled, goal ldl < 100, pt to continue current zetia, declines statin

## 2021-02-23 NOTE — Assessment & Plan Note (Signed)
Lab Results  Component Value Date   HGBA1C 6.4 08/12/2020   Stable, pt to continue current medical treatment  - diet

## 2021-02-23 NOTE — Assessment & Plan Note (Signed)
Lab Results  Component Value Date   VITAMINB12 506 08/12/2020   Stable, cont oral replacement - b12 1000 mcg qd

## 2021-02-26 ENCOUNTER — Telehealth: Payer: Self-pay

## 2021-02-26 NOTE — Patient Outreach (Signed)
Aging Gracefully Program  02/26/2021  Deanna Wall [birth year] [id number]   Attempted to reach patient again today to schedule home visit. No answer. Placed call to daughters number Ms. Wood. Left a message requesting a call back.  Rowe Pavy RN, BSN, Careers adviser for [employer] Mobile: [phone]

## 2021-02-28 ENCOUNTER — Telehealth: Payer: Self-pay

## 2021-02-28 NOTE — Patient Outreach (Signed)
Aging Gracefully Program  02/28/2021  HYDIE LANGAN 06-02-1931 [id number]   Daughter returned call and left me a voicemail. I called patient back at daughters home. Scheduled home visit on 03/03/2021 T 230PM.  @ [facility]  [address] [place] [address]  Rowe Pavy RN, BSN, CEN RN Case Production designer, theatre/television/film for Aging Chemical engineer Mobile: [phone]

## 2021-03-03 ENCOUNTER — Other Ambulatory Visit: Payer: Self-pay

## 2021-03-03 NOTE — Patient Outreach (Signed)
Aging Gracefully Program  RN Visit  03/03/2021  Deanna Wall 1931-07-25 [id number]  Visit:   RN home visit #1  Start Time:   230 End Time:   330 Total Minutes:   60  Readiness To Change Score:     Universal RN Interventions: Calendar Distribution: Yes Exercise Review: No Medications: Yes Medication Changes: Yes Mood: Yes Pain: Yes PCP Advocacy/Support: Yes Fall Prevention: Yes Incontinence: Yes Clinician View Of Client Situation: Seen client at her daughters home due to water leak.  Awake and alert. Sitting in wheelchair. Client View Of His/Her Situation: Recent water leak in her bathroom. Now staying with daughter until home repaired.  Reports she cant get clothes out of wash machine. can Dre Gamino own meals. Wash her dishes. Family assist with housing keeping.  Able to pay own bills.  She likes to go to the  grocery store. Reports has had MS since 1974.  Goes out 2 times per week.  Has a wheel chair ramp at her home.  Healthcare Provider Communication: Did Surveyor, mining With [employer] Provider?: No According to Client, Did PCP Report Communication With An Aging Gracefully RN?: No  Clinician View of Client Situation: Clinician View Of Client Situation: Seen client at her daughters home due to water leak.  Awake and alert. Sitting in wheelchair. Client's View of His/Her Situation: Client View Of His/Her Situation: Recent water leak in her bathroom. Now staying with daughter until home repaired.  Reports she cant get clothes out of wash machine. can Deanna Wall own meals. Wash her dishes. Family assist with housing keeping.  Able to pay own bills.  She likes to go to the  grocery store. Reports has had MS since 1974.  Goes out 2 times per week.  Has a wheel chair ramp at her home.  Medication Assessment: Do You Have Any Problems Paying For Medications?: No Where Does Client Store Medications?: Kitchen Table Can Client Read Pill Bottles?: No Does Client Use A Pillbox?: Yes Does  Anyone Assist Client In Filling Pillbox?: No Does Anyone Assist Client In Taking Medications?: No Do You Take Vitamin D?: Yes Does Client Have Any Questions Or Concerns About Medictions?: No Is Client Complaining Of Any Symptoms That Could Be Side Effects To Medications?: No Any Possible Changes In Medication Regimen?: No  OT Update: pending community housing solutions assessment  Session Summary: Patient wheelchair bound after femur fracture 04/2020.     Goals Addressed             This Visit's Progress    Patient Stated       RN AGING GRACEFULLY   03/03/2021 Patient reports she wants to feel good living at home.  Assessment: Reviewed with patient her current living situation when she is at her house. Reports one daughter is with her during the day and then other daughter stays with her at night.  Patient reports she feels like she can be alone more.    Intervention: Reviewed safety with getting in and out of the home in case of an emergency. Reviewed problems with toileting independently. Patient is pending getting back into her home after the water leak.   PLAN: next home visit 03/31/2021      Rowe Pavy RN, BSN, CEN RN Case Manager for Aging Chemical engineer Mobile: [phone]

## 2021-03-06 DIAGNOSIS — G35 Multiple sclerosis: Secondary | ICD-10-CM | POA: Diagnosis not present

## 2021-03-06 DIAGNOSIS — M6281 Muscle weakness (generalized): Secondary | ICD-10-CM | POA: Diagnosis not present

## 2021-03-06 DIAGNOSIS — S72401D Unspecified fracture of lower end of right femur, subsequent encounter for closed fracture with routine healing: Secondary | ICD-10-CM | POA: Diagnosis not present

## 2021-03-13 DIAGNOSIS — G35 Multiple sclerosis: Secondary | ICD-10-CM | POA: Diagnosis not present

## 2021-03-13 DIAGNOSIS — R269 Unspecified abnormalities of gait and mobility: Secondary | ICD-10-CM | POA: Diagnosis not present

## 2021-03-13 DIAGNOSIS — S72401D Unspecified fracture of lower end of right femur, subsequent encounter for closed fracture with routine healing: Secondary | ICD-10-CM | POA: Diagnosis not present

## 2021-03-17 ENCOUNTER — Other Ambulatory Visit: Payer: Self-pay | Admitting: Internal Medicine

## 2021-03-31 ENCOUNTER — Other Ambulatory Visit: Payer: Self-pay

## 2021-03-31 NOTE — Patient Outreach (Signed)
Aging Gracefully Program  RN Visit  03/31/2021  Deanna Wall 1931/06/21 [id number]  Visit:   RN home visit #2  Start Time:   1115am End Time:   11:40 am Total Minutes:   25  Readiness To Change Score:     Universal RN Interventions: Exercise Review: Yes Medications: Yes Medication Changes: Yes Mood: Yes Pain: Yes PCP Advocacy/Support: Yes Fall Prevention: Yes Incontinence: Yes Clinician View Of Client Situation: Client seen again at daughters home due to repairs have not been completed at her home. Patient well dressed sitting in wheel chair in living room. Daughter present. Client View Of His/Her Situation: Patient described being very frustrated that her home is not fixed by the contractor. Reports she does not understand. Otherwise reports she is doing well. Continues to be wheelchair bound. Denies any falls or hospitalizations. Reports no recent MD visits. Reports taking medications as prescribed.  Reports she is eating, drinking and sleeping well.  Healthcare Provider Communication:none  Clinician View of Client Situation: Clinician View Of Client Situation: Client seen again at daughters home due to repairs have not been completed at her home. Patient well dressed sitting in wheel chair in living room. Daughter present. Client's View of His/Her Situation: Client View Of His/Her Situation: Patient described being very frustrated that her home is not fixed by the contractor. Reports she does not understand. Otherwise reports she is doing well. Continues to be wheelchair bound. Denies any falls or hospitalizations. Reports no recent MD visits. Reports taking medications as prescribed.  Reports she is eating, drinking and sleeping well.  Medication Assessment:no changes    OT Update: pending home modifications  Session Summary: Patient at her daughters, frustrated and ready to get back to her own home. Otherwise is doing well. Appears to be in good spirits.   Goals  Addressed             This Visit's Progress    Patient Stated       RN AGING GRACEFULLY   03/03/2021 Patient reports she wants to feel good living at home.  Assessment: Reviewed with patient her current living situation when she is at her house. Reports one daughter is with her during the day and then other daughter stays with her at night.  Patient reports she feels like she can be alone more.    Intervention: Reviewed safety with getting in and out of the home in case of an emergency. Reviewed problems with toileting independently. Patient is pending getting back into her home after the water leak.   PLAN: next home visit 03/31/2021 Tomasa Rand RN, BSN, CEN RN Case Manager for [employer] Mobile: [phone]   03/31/2021 Assessment: Remains at her daughters. Voices that she is very frustrated. Interventions: encouraged patient and daughter to speak to a supervisor and give them a deadline for completion.  Reviewed home exercises and provided demonstration and print out for chair exercises. Encouraged patient to do her exercises daily.  Plan: next home visit 05/05/2021  at 11:30 am  [place], BSN, CEN RN Case Freight forwarder for [place] Mobile: [phone]

## 2021-03-31 NOTE — Patient Instructions (Signed)
Goals Addressed             This Visit's Progress    Patient Stated       RN AGING GRACEFULLY   03/03/2021 Patient reports she wants to feel good living at home.  Assessment: Reviewed with patient her current living situation when she is at her house. Reports one daughter is with her during the day and then other daughter stays with her at night.  Patient reports she feels like she can be alone more.    Intervention: Reviewed safety with getting in and out of the home in case of an emergency. Reviewed problems with toileting independently. Patient is pending getting back into her home after the water leak.   PLAN: next home visit 03/31/2021 Rowe Pavy RN, BSN, CEN RN Case Manager for Aging Gracefully [facility] Mobile: [phone]   03/31/2021 Assessment: Remains at her daughters. Voices that she is very frustrated. Interventions: encouraged patient and daughter to speak to a supervisor and give them a deadline for completion.  Reviewed home exercises and provided demonstration and print out for chair exercises. Encouraged patient to do her exercises daily.  Plan: next home visit 05/05/2021  at 11:30 am  Rowe Pavy RN, BSN, CEN RN Case Production designer, theatre/television/film for Aging Chemical engineer Mobile: [phone]

## 2021-04-21 ENCOUNTER — Telehealth: Payer: Self-pay | Admitting: Internal Medicine

## 2021-04-21 NOTE — Telephone Encounter (Signed)
Type of form received- FMLA    Form placed in- Provider mailbox  Additional instructions from the patient- Fax when completed   Things to remember: Front office: If form received in person, remind patient that forms take 7-10 business days CMA should attach charge sheet and put on [employer]

## 2021-04-28 ENCOUNTER — Other Ambulatory Visit: Payer: Self-pay | Admitting: Internal Medicine

## 2021-04-28 NOTE — Telephone Encounter (Signed)
Please refill as per office routine med refill policy (all routine meds to be refilled for 3 mo or monthly (per pt preference) up to one year from last visit, then month to month grace period for 3 mo, then further med refills will have to be denied) ? ?

## 2021-04-28 NOTE — Telephone Encounter (Signed)
Paperwork is complete; patient's daughter notified.

## 2021-04-29 ENCOUNTER — Other Ambulatory Visit: Payer: Self-pay

## 2021-04-29 ENCOUNTER — Other Ambulatory Visit: Payer: Self-pay | Admitting: Occupational Therapy

## 2021-04-29 NOTE — Patient Outreach (Signed)
Aging Gracefully Program  OT Follow-Up Visit  04/29/2021  ROBERT SUNGA Feb 05, 1932 [id number]  Visit:  2- Second Visit  Start Time:  1300 End Time:  1345 Total Minutes:  45  Readiness to Change Score :  Readiness to Change Score: 10  Today's Visit: Focus was on meeting up with Renae Fickle from [employer] [facility]) to see about how much we could actually make it accessible from a wheelchair standpoint. It looks possible with widening door and changing toilet to round instead of oblong toilet she could get to tub (possibly converted to walk in shower) and transfer to a tub seat. She is not sure she wants a tub to shower conversion so will try a tub bench next visit to see if this will work for her.   Post Clinical Reasoning: Clinician View Of Client Situation:: Ms. Guerin is happy to be back in her home.She is really interested in a permanent deck/ramp area at her front door so she can get in and out of her house by herself. She is considering what she may actually want to do in her bathroom. Client View Of His/Her Situation:: She continues to do well for herself. She was happy with trying out a drop arm 3n1. Next Visit Plan:: Tub bench to try instead of tub to walk in shower conversion.  Ignacia Palma, OTR/L Aging Gracefully [phone]

## 2021-04-29 NOTE — Patient Instructions (Signed)
No true goal addressed this session--the goal of session was to meet with Eddie Dibbles from [facility] and look at options for bathroom.

## 2021-05-05 ENCOUNTER — Other Ambulatory Visit: Payer: Self-pay

## 2021-05-05 NOTE — Patient Instructions (Signed)
°   Goals Addressed             This Visit's Progress    Patient Stated       RN AGING GRACEFULLY   03/03/2021 Patient reports she wants to feel good living at home.  Assessment: Reviewed with patient her current living situation when she is at her house. Reports one daughter is with her during the day and then other daughter stays with her at night.  Patient reports she feels like she can be alone more.    Intervention: Reviewed safety with getting in and out of the home in case of an emergency. Reviewed problems with toileting independently. Patient is pending getting back into her home after the water leak.   PLAN: next home visit 03/31/2021 Rowe Pavy RN, BSN, CEN RN Case Manager for Aging [facility] Mobile: [phone]   03/31/2021 Assessment: Remains at her daughters. Voices that she is very frustrated. Interventions: encouraged patient and daughter to speak to a supervisor and give them a deadline for completion.  Reviewed home exercises and provided demonstration and print out for chair exercises. Encouraged patient to do her exercises daily.  Plan: next home visit 05/05/2021  at 11:30 am  Rowe Pavy RN, BSN, CEN RN Case Manager for Aging [facility] Mobile: [phone]   05/05/2021  Assessment: Patient reports that she is very happy to be back in her own home. Reports that she is doing everything independently except toileting. Reports she is waiting for plans from [facility] for her home.  Interventions: encouraged patient to continue to do her home exercises as much as possible to help gain strength in her legs so she can get on and off the toilet in the future. Plan: next home visit planned for 06/04/2021  at 12:30 pm.  Rowe Pavy RN, BSN, CEN RN Case Manager for Aging [facility] Mobile: [phone]

## 2021-05-05 NOTE — Patient Outreach (Signed)
Aging [facility]fully Program  RN Visit  05/05/2021  Deanna Wall 1932/01/01 [id number]  Visit:   RN home visit #3  Start Time:   1125 End Time:   1155 Total Minutes:   30  Readiness To Change Score:     Universal RN Interventions: Exercise Review: Yes Medications: Yes Medication Changes: Yes Mood: Yes Pain: Yes PCP Advocacy/Support: Yes Fall Prevention: Yes Clinician View Of Client Situation: Client back in her home. Daughter present for home visit.  Patient awake and sititng in her wheelchair. Client View Of His/Her Situation: Patient reports she is happy to be at her own home. Reports she has been doing her own cooking and that she enjoys cooking in her own kitchen. Reports no falls, no pain and no new problems or concerns.  Reports she is eating, drinking and sleeping well. Has a hospital bed and is able to get herself to bed.  Reports she was able to get on commode with devices provided by OT and is looking forward to being able to tdo this in the future.. Patient reports she is doing her home exercises 5 x per week.  Reports she is feeling stronger.  Healthcare Provider Communication: Did Surveyor, mining With [employer] Provider?: No According to Client, Did PCP Report Communication With An Aging Gracefully RN?: No  Clinician View of Client Situation: Clinician View Of Client Situation: Client back in her home. Daughter present for home visit.  Patient awake and sititng in her wheelchair. Client's View of His/Her Situation: Client View Of His/Her Situation: Patient reports she is happy to be at her own home. Reports she has been doing her own cooking and that she enjoys cooking in her own kitchen. Reports no falls, no pain and no new problems or concerns.  Reports she is eating, drinking and sleeping well. Has a hospital bed and is able to get herself to bed.  Reports she was able to get on commode with devices provided by OT and is looking forward to being able to tdo this in  the future.. Patient reports she is doing her home exercises 5 x per week.  Reports she is feeling stronger.  Medication Assessment: denies any changes to medications    OT Update: pending [employer]  Session Summary: Patient doing very well back in her own home. No new problems, no new falls.   Goals Addressed             This Visit's Progress    Patient Stated       RN AGING GRACEFULLY   03/03/2021 Patient reports she wants to feel good living at home.  Assessment: Reviewed with patient her current living situation when she is at her house. Reports one daughter is with her during the day and then other daughter stays with her at night.  Patient reports she feels like she can be alone more.    Intervention: Reviewed safety with getting in and out of the home in case of an emergency. Reviewed problems with toileting independently. Patient is pending getting back into her home after the water leak.   PLAN: next home visit 03/31/2021 Rowe Pavy RN, BSN, CEN RN Case Manager for A[facility] Mobile: [phone]   03/31/2021 Assessment: Remains at her daughters. Voices that she is very frustrated. Interventions: encouraged patient and daughter to speak to a supervisor and give them a deadline for completion.  Reviewed home exercises and provided demonstration and print out for chair exercises.  Encouraged patient to do her exercises daily.  Plan: next home visit 05/05/2021  at 11:30 am  Rowe Pavy RN, BSN, CEN RN Case Manager for A[facility] Mobile: [phone]   05/05/2021  Assessment: Patient reports that she is very happy to be back in her own home. Reports that she is doing everything independently except toileting. Reports she is waiting for plans from [facility] for her home.  Interventions: encouraged patient to continue to do her home exercises as much as possible to  help gain strength in her legs so she can get on and off the toilet in the future. Plan: next home visit planned for 06/04/2021  at 12:30 pm.  Rowe Pavy RN, BSN, CEN RN Case Manager for A[facility] Mobile: [phone]

## 2021-05-12 ENCOUNTER — Other Ambulatory Visit: Payer: Self-pay

## 2021-05-12 ENCOUNTER — Other Ambulatory Visit: Payer: Self-pay | Admitting: Occupational Therapy

## 2021-05-12 NOTE — Patient Outreach (Signed)
Aging [facility]fully Program  OT Follow-Up Visit  05/12/2021  Deanna Wall 01-09-1932 [id number]  Visit:  3- Third Visit  Start Time:  [id number] End Time:  [id number] Total Minutes:  30  Readiness to Change Score :  Readiness to Change Score: 10   Post Clinical Reasoning: Clinician View Of Client Situation:: [name] was seen today to try a tub bench to see if she could do the transfer, and see if the tub bench was something she wanted. [name] was able to do tranfer from her W/C to drop arm commode and thne from drop arm commode to tub bench with steadying of W/C so it would not move and min guard A for transfers. Then she needed Min A to get RLE in and out of tub and adjust shower curtain. Client View Of His/Her Situation:: [name] feels at this time she does not want to do a tub>showr coversion nor does she want a tub bench, hand held shower, nor extra grab bars in her shower. Paul from [facility] was made aware. She does want a drop arm commode for sure. Next visit: Provide drop arm 3n1.  Golden Circle, OTR/L Aging [facility] [phone]

## 2021-05-12 NOTE — Patient Instructions (Signed)
Continuing to work on best options for bathroom modifications and DME.

## 2021-05-14 DIAGNOSIS — G35 Multiple sclerosis: Secondary | ICD-10-CM | POA: Diagnosis not present

## 2021-05-14 DIAGNOSIS — R269 Unspecified abnormalities of gait and mobility: Secondary | ICD-10-CM | POA: Diagnosis not present

## 2021-05-14 DIAGNOSIS — S72401D Unspecified fracture of lower end of right femur, subsequent encounter for closed fracture with routine healing: Secondary | ICD-10-CM | POA: Diagnosis not present

## 2021-05-28 ENCOUNTER — Other Ambulatory Visit: Payer: Self-pay

## 2021-05-29 ENCOUNTER — Other Ambulatory Visit: Payer: Self-pay | Admitting: Internal Medicine

## 2021-05-29 NOTE — Telephone Encounter (Signed)
Please refill as per office routine med refill policy (all routine meds to be refilled for 3 mo or monthly (per pt preference) up to one year from last visit, then month to month grace period for 3 mo, then further med refills will have to be denied) ? ?

## 2021-05-30 NOTE — Patient Instructions (Signed)
Goals Addressed   ? ?  ?  ?  ?  ? This Visit's Progress  ?  COMPLETED: Patient Stated     ?  RN AGING GRACEFULLY  ? ?03/03/2021 ?Patient reports she wants to feel good living at home. ? ?Assessment: Reviewed with patient her current living situation when she is at her house. Reports one daughter is with her during the day and then other daughter stays with her at night.  Patient reports she feels like she can be alone more.   ? ?Intervention: Reviewed safety with getting in and out of the home in case of an emergency. Reviewed problems with toileting independently. Patient is pending getting back into her home after the water leak.  ? ?PLAN: next home visit 03/31/2021 ?Amanda Cook RN, BSN, CEN ?RN Case Manager for Aging [facility] ?[facility] ?Mobile: [phone]  ? ?03/31/2021 ?Assessment: Remains at her daughters. Voices that she is very frustrated. ?Interventions: encouraged patient and daughter to speak to a supervisor and give them a deadline for completion.  ?Reviewed home exercises and provided demonstration and print out for chair exercises. Encouraged patient to do her exercises daily.  ?Plan: next home visit 05/05/2021  at 11:30 am ? ?Amanda Cook RN, BSN, CEN ?RN Case Manager for Aging [facility] ?[facility] ?Mobile: [phone]  ? ?05/05/2021 ? ?Assessment: Patient reports that she is very happy to be back in her own home. Reports that she is doing everything independently except toileting. Reports she is waiting for plans from [facility] for her home.  ?Interventions: encouraged patient to continue to do her home exercises as much as possible to help gain strength in her legs so she can get on and off the toilet in the future. ?Plan: next home visit planned for 06/04/2021  at 12:30 pm. ? ?Amanda Cook RN, BSN, CEN ?RN Case Manager for Aging [facility] ?[facility] ?Mobile: [phone]  ? ?05/28/2021 ?Assessment: Client doing well. Happy to be at  home. Reports cooking in her own kitchen.  Reports she is much happier now that her home is fixed.  ? ?Interventions: Encouraged patient to continue to do her home exercises daily to improve her strength.. ? ?Plan: Goal met. ? ?Amanda Cook RN, BSN, CEN ?RN Case Manager for Aging [facility] ?[facility] ?Mobile: [phone]  ?  ? ?  ? ?

## 2021-05-30 NOTE — Patient Outreach (Signed)
Aging [facility]fully Program ? ?RN Visit ? ?05/28/2021 ? ?Horris Latino ?[birth year] ?[id number] ? ?Visit:   RN home visit #4 ? ?Start Time:   400pm ?End Time:   430 pm ?Total Minutes:   30 ? ?Readiness To Change Score:    ? ?Universal RN Interventions: ?Calendar Distribution: Yes ?Exercise Review: Yes ?Medications: Yes ?Medication Changes: Yes ?Mood: Yes ?Pain: Yes ?PCP Advocacy/Support: No ?Fall Prevention: Yes ?Incontinence: Yes ?Clinician View Of Client Situation: Home neat and clean.  Family present for home visit.  Patient sitting in her wheelchair.  Well dressed, Awake and alert ?Client View Of His/Her Situation: Patient in her home. Doing well. Reports she is looking forward to the OT helping her learn to transfer on and off the toilet.  Patient reports that her family want her to go to the beach but she does not want to go. Reports that she feels like she will be a burden.  Reports she is cooking every day and that makes her happy.  Denies any recent falls. Denies any pain. Reports that she continues to do her home exercises about 5 times per week.  Reports she is feeling stronger. ? ?Healthcare Provider Communication:none ?  ? ?Clinician View of Client Situation: ?Clinician View Of Client Situation: Home neat and clean.  Family present for home visit.  Patient sitting in her wheelchair.  Well dressed, Awake and alert ?Client's View of His/Her Situation: ?Client View Of His/Her Situation: Patient in her home. Doing well. Reports she is looking forward to the OT helping her learn to transfer on and off the toilet.  Patient reports that her family want her to go to the beach but she does not want to go. Reports that she feels like she will be a burden.  Reports she is cooking every day and that makes her happy.  Denies any recent falls. Denies any pain. Reports that she continues to do her home exercises about 5 times per week.  Reports she is feeling stronger. ? ?Medication Assessment:no changes ?  ? ?OT Update:  RN home visit completed. ? ?Session Summary: Patient doing very well. Happy to be in her home. No new concerns today. ? ? Goals Addressed   ? ?  ?  ?  ?  ? This Visit's Progress  ?  COMPLETED: Patient Stated     ?  RN AGING GRACEFULLY  ? ?03/03/2021 ?Patient reports she wants to feel good living at home. ? ?Assessment: Reviewed with patient her current living situation when she is at her house. Reports one daughter is with her during the day and then other daughter stays with her at night.  Patient reports she feels like she can be alone more.   ? ?Intervention: Reviewed safety with getting in and out of the home in case of an emergency. Reviewed problems with toileting independently. Patient is pending getting back into her home after the water leak.  ? ?PLAN: next home visit 03/31/2021 ?Tomasa Rand RN, BSN, CEN ?RN Case Manager for Aging [facility] ?[place] ?Mobile: [phone]  ? ?03/31/2021 ?Assessment: Remains at her daughters. Voices that she is very frustrated. ?Interventions: encouraged patient and daughter to speak to a supervisor and give them a deadline for completion.  ?Reviewed home exercises and provided demonstration and print out for chair exercises. Encouraged patient to do her exercises daily.  ?Plan: next home visit 05/05/2021  at 11:30 am ? ?Tomasa Rand RN, BSN, CEN ?RN Case Manager for Aging [facility] ?[place] ?  Mobile: [phone]  ? ?05/05/2021 ? ?Assessment: Patient reports that she is very happy to be back in her own home. Reports that she is doing everything independently except toileting. Reports she is waiting for plans from [facility] for her home.  ?Interventions: encouraged patient to continue to do her home exercises as much as possible to help gain strength in her legs so she can get on and off the toilet in the future. ?Plan: next home visit planned for 06/04/2021  at 12:30 pm. ? ?Tomasa Rand RN, BSN, CEN ?RN Case Manager for Aging  Gracefully ?[place] ?Mobile: [phone]  ? ?05/28/2021 ?Assessment: Client doing well. Happy to be at home. Reports cooking in her own kitchen.  Reports she is much happier now that her home is fixed.  ? ?Interventions: Encouraged patient to continue to do her home exercises daily to improve her strength.. ? ?Plan: Goal met. ? ?Tomasa Rand RN, BSN, CEN ?RN Case Manager for Aging [facility] ?[place] ?Mobile: [phone]  ?  ? ?  ? Tomasa Rand RN, BSN, CEN ?RN Case Manager for Aging [facility] ?[place] ?Mobile: [phone]  ? ? ?

## 2021-06-03 ENCOUNTER — Telehealth: Payer: Self-pay | Admitting: Internal Medicine

## 2021-06-03 DIAGNOSIS — G35 Multiple sclerosis: Secondary | ICD-10-CM

## 2021-06-03 NOTE — Telephone Encounter (Signed)
Lynden Ang from [employer] ? ?Requesting order for a drop arm bedside commode from [employer] ? ?Patient name ?DOB ?Demographic Sheet ?Face to face encounter sheet (all must be included w/ fax) ? ?Fax # [phone] ?ATTNOletha Cruel ?

## 2021-06-03 NOTE — Telephone Encounter (Signed)
Done erx 

## 2021-06-04 NOTE — Telephone Encounter (Signed)
Order has been faxed

## 2021-06-06 DIAGNOSIS — G35 Multiple sclerosis: Secondary | ICD-10-CM | POA: Diagnosis not present

## 2021-06-11 DIAGNOSIS — G35 Multiple sclerosis: Secondary | ICD-10-CM | POA: Diagnosis not present

## 2021-06-11 DIAGNOSIS — S72401D Unspecified fracture of lower end of right femur, subsequent encounter for closed fracture with routine healing: Secondary | ICD-10-CM | POA: Diagnosis not present

## 2021-06-11 DIAGNOSIS — R269 Unspecified abnormalities of gait and mobility: Secondary | ICD-10-CM | POA: Diagnosis not present

## 2021-06-30 ENCOUNTER — Other Ambulatory Visit: Payer: Self-pay | Admitting: Internal Medicine

## 2021-06-30 NOTE — Telephone Encounter (Signed)
Please refill as per office routine med refill policy (all routine meds to be refilled for 3 mo or monthly (per pt preference) up to one year from last visit, then month to month grace period for 3 mo, then further med refills will have to be denied) ? ?

## 2021-07-18 ENCOUNTER — Other Ambulatory Visit: Payer: Medicare Other | Admitting: Occupational Therapy

## 2021-07-19 NOTE — Patient Outreach (Signed)
Aging [facility]fully Program ? ?OT Follow-Up Visit ? ?07/19/2021 ? ?Deanna Wall ?[birth year] ?[id number] ? ?Visit:  4- Fourth Visit ? ?Start Time:  1600 ?End Time:  1700 ?Total Minutes:  60 ? ?Readiness to Change Score :  Readiness to Change Score: 10 ? ?Durable Medical Equipment: ?Durable Medical Equipment: Bedside Commode (drop arm) ?Durable Medical Equipment Distribution Date: 06/06/21 ? ? ?Goals:  ? Goals Addressed   ? ?  ?  ?  ?  ? This Visit's Progress  ?  COMPLETED: Patient Stated     ?  She would like to consider a walk in shower and pedestal sink, maybe even a round instead of elongated toilet to give her more room for wheelchair in her bathroom. May need to find a way to secure 3n1 in bathroom or bedroom so it does not shift/tilt on her. Client decided not have her tub/shower converted to a walk in shower, or a pedestal sink, nor changing out her shape of toilet. She also has decided to use her drop arm 3n1 next to her her bed instead of over the toilet in the bathroom.MET ? ?ACTION PLANNING - FUNCTIONAL MOBILITY  ?Target Problem Area: ?Decreased independence with toileting  ?Why Problem May Occur: ?Cannot stand on own and pivot or ambulate to toilet or BSC. Client uses wheelchair to get around.  ?Target Goal: ?Increased independence with toileting  ? ?STRATEGIES ?Saving Your Energy: ?DO: DON'T:  ?Take your time  Don't rush  ?Raise the height the bedside commode to match the height of your bed or wheelchair depending on whether you are using it in the bedroom or the bathroom.   ?Modifying your home environment and making it safe: ?DO: DON'T:  ?Use the drop arm bedside commode for toileting Hold onto unsafe surfaces while transferring (ie: anything that will move)  ?Remove throw rugs   ?Provide adequate lighting Use dim lights or lights that cast a lot of shadows  ?Simplifying the way you set up tasks or daily routines: ?DO: DON'T:  ?Move slowly Rush during transfers  ?Practice ?It is important to practice the  strategies so we can determine if they will be effective in helping to reach your goal. ?Follow these specific recommendations: ?1. Drop arm of commode to whatever side you are transferring to as well as arm of the wheelchair. ?2.Use liners in bucket of commode and absorbent pads ?3.Partially lift off of toilet of toilet to let family help you with clothing and hygiene as needed. ?If a strategy does not work the first time, try it again and again (and maybe again). ?We may make some changes over the next few sessions, based on how they work. ? ?Golden Circle, OTR/L      07/18/2021 ?  ? ?  ? ? ?Post Clinical Reasoning: ?Client Action (Goal) Four Interventions: Pt has received her drop arm 3n1 a month ago but has not tried ot use it. She did practice today. ?Did Client Try?: Yes ?Targeted Problem Area Status: A Lot Better ?Clinician View Of Client Situation:: Deanna Wall was seen today to practice her drop arm 3n1 from her bed and from her wheelchair while drop arm 3n1 next to bed. She is able to transfer at a S level (has this at home 24/7). She does need A for toileting hygiene and clothing and I went over this with her and her daughter.I also provided them with liners for the bucket of the 3n1 and aborbant liners as well. ?Client View Of His/Her Situation::  Deanna Wall really wants to be able to use her drop arm 3n1 but is new to her so she is a bit uneasy about it even though she transferred well. She reports she will try and use it. ?Next Visit Plan:: sock aid ? ?Golden Circle, OTR/L ?[facility]ging [facility] ?[phone] ? ? ?

## 2021-07-19 NOTE — Patient Instructions (Addendum)
She would like to consider a walk in shower and pedestal sink, maybe even a round instead of elongated toilet to give her more room for wheelchair in her bathroom. May need to find a way to secure 3n1 in bathroom or bedroom so it does not shift/tilt on her. Client decided not have her tub/shower converted to a walk in shower, or a pedestal sink, nor changing out her shape of toilet. She also has decided to use her drop arm 3n1 next to her her bed instead of over the toilet in the bathroom.MET ? ?ACTION PLANNING - FUNCTIONAL MOBILITY  ?Target Problem Area: ?Decreased independence with toileting  ?Why Problem May Occur: ?Cannot stand on own and pivot or ambulate to toilet or BSC. Client uses wheelchair to get around.  ?Target Goal: ?Increased independence with toileting  ? ?STRATEGIES ?Saving Your Energy: ?DO: DON'T:  ?Take your time  Don't rush  ?Raise the height the bedside commode to match the height of your bed or wheelchair depending on whether you are using it in the bedroom or the bathroom.   ? ?Modifying your home environment and making it safe: ?DO: DON'T:  ?Use the drop arm bedside commode for toileting Hold onto unsafe surfaces while transferring (ie: anything that will move)  ?Remove throw rugs   ?Provide adequate lighting Use dim lights or lights that cast a lot of shadows  ? ?Simplifying the way you set up tasks or daily routines: ?DO: DON'T:  ?Move slowly Rush during transfers  ?Practice ?It is important to practice the strategies so we can determine if they will be effective in helping to reach your goal. ?Follow these specific recommendations: ?1. Drop arm of commode to whatever side you are transferring to as well as arm of the wheelchair. ?2.Use liners in bucket of commode and absorbent pads ?3.Partially lift off of toilet of toilet to let family help you with clothing and hygiene as needed. ?If a strategy does not work the first time, try it again and again (and maybe again). ?We may make some  changes over the next few sessions, based on how they work. ? ?Golden Circle, OTR/L      07/18/2021 ?

## 2021-07-29 DIAGNOSIS — H401232 Low-tension glaucoma, bilateral, moderate stage: Secondary | ICD-10-CM | POA: Diagnosis not present

## 2021-07-29 DIAGNOSIS — H35033 Hypertensive retinopathy, bilateral: Secondary | ICD-10-CM | POA: Diagnosis not present

## 2021-07-29 DIAGNOSIS — H0102A Squamous blepharitis right eye, upper and lower eyelids: Secondary | ICD-10-CM | POA: Diagnosis not present

## 2021-07-29 DIAGNOSIS — H35372 Puckering of macula, left eye: Secondary | ICD-10-CM | POA: Diagnosis not present

## 2021-08-20 ENCOUNTER — Ambulatory Visit (INDEPENDENT_AMBULATORY_CARE_PROVIDER_SITE_OTHER): Payer: Medicare Other | Admitting: Internal Medicine

## 2021-08-20 ENCOUNTER — Other Ambulatory Visit: Payer: Self-pay | Admitting: Internal Medicine

## 2021-08-20 ENCOUNTER — Encounter: Payer: Self-pay | Admitting: Internal Medicine

## 2021-08-20 VITALS — BP 130/70 | HR 44 | Temp 98.0°F | Ht 67.0 in

## 2021-08-20 DIAGNOSIS — R001 Bradycardia, unspecified: Secondary | ICD-10-CM

## 2021-08-20 DIAGNOSIS — G35 Multiple sclerosis: Secondary | ICD-10-CM

## 2021-08-20 DIAGNOSIS — E538 Deficiency of other specified B group vitamins: Secondary | ICD-10-CM | POA: Diagnosis not present

## 2021-08-20 DIAGNOSIS — R7302 Impaired glucose tolerance (oral): Secondary | ICD-10-CM

## 2021-08-20 DIAGNOSIS — I10 Essential (primary) hypertension: Secondary | ICD-10-CM

## 2021-08-20 DIAGNOSIS — E559 Vitamin D deficiency, unspecified: Secondary | ICD-10-CM

## 2021-08-20 DIAGNOSIS — E782 Mixed hyperlipidemia: Secondary | ICD-10-CM | POA: Diagnosis not present

## 2021-08-20 DIAGNOSIS — D509 Iron deficiency anemia, unspecified: Secondary | ICD-10-CM | POA: Diagnosis not present

## 2021-08-20 DIAGNOSIS — Z0001 Encounter for general adult medical examination with abnormal findings: Secondary | ICD-10-CM | POA: Diagnosis not present

## 2021-08-20 LAB — MICROALBUMIN / CREATININE URINE RATIO
Creatinine,U: 62.9 mg/dL
Microalb Creat Ratio: 1.1 mg/g (ref 0.0–30.0)
Microalb, Ur: <0.7 mg/dL (ref 0.0–1.9)

## 2021-08-20 LAB — CBC WITH DIFFERENTIAL/PLATELET
Basophils Absolute: 0 10*3/uL (ref 0.0–0.1)
Basophils Relative: 0.7 % (ref 0.0–3.0)
Eosinophils Absolute: 0.1 10*3/uL (ref 0.0–0.7)
Eosinophils Relative: 1.9 % (ref 0.0–5.0)
HCT: 35.2 % — ABNORMAL LOW (ref 36.0–46.0)
Hemoglobin: 11.5 g/dL — ABNORMAL LOW (ref 12.0–15.0)
Lymphocytes Relative: 32.5 % (ref 12.0–46.0)
Lymphs Abs: 2.4 10*3/uL (ref 0.7–4.0)
MCHC: 32.7 g/dL (ref 30.0–36.0)
MCV: 87.1 fl (ref 78.0–100.0)
Monocytes Absolute: 0.6 10*3/uL (ref 0.1–1.0)
Monocytes Relative: 7.7 % (ref 3.0–12.0)
Neutro Abs: 4.1 10*3/uL (ref 1.4–7.7)
Neutrophils Relative %: 57.2 % (ref 43.0–77.0)
Platelets: 307 10*3/uL (ref 150.0–400.0)
RBC: 4.04 Mil/uL (ref 3.87–5.11)
RDW: 15 % (ref 11.5–15.5)
WBC: 7.2 10*3/uL (ref 4.0–10.5)

## 2021-08-20 LAB — URINALYSIS, ROUTINE W REFLEX MICROSCOPIC
Bilirubin Urine: NEGATIVE
Hgb urine dipstick: NEGATIVE
Ketones, ur: NEGATIVE
Leukocytes,Ua: NEGATIVE
Nitrite: NEGATIVE
RBC / HPF: NONE SEEN (ref 0–?)
Specific Gravity, Urine: 1.01 (ref 1.000–1.030)
Total Protein, Urine: NEGATIVE
Urine Glucose: NEGATIVE
Urobilinogen, UA: 1 (ref 0.0–1.0)
pH: 8 (ref 5.0–8.0)

## 2021-08-20 LAB — LIPID PANEL
Cholesterol: 188 mg/dL (ref 0–200)
HDL: 59.2 mg/dL (ref >39.00)
LDL Cholesterol: 109 mg/dL — ABNORMAL HIGH (ref 0–99)
NonHDL: 128.31
Total CHOL/HDL Ratio: 3
Triglycerides: 95 mg/dL (ref 0.0–149.0)
VLDL: 19 mg/dL (ref 0.0–40.0)

## 2021-08-20 LAB — BASIC METABOLIC PANEL
BUN: 15 mg/dL (ref 6–23)
CO2: 29 mEq/L (ref 19–32)
Calcium: 9.5 mg/dL (ref 8.4–10.5)
Chloride: 106 mEq/L (ref 96–112)
Creatinine, Ser: 0.9 mg/dL (ref 0.40–1.20)
GFR: 57.39 mL/min — ABNORMAL LOW (ref >60.00)
Glucose, Bld: 112 mg/dL — ABNORMAL HIGH (ref 70–99)
Potassium: 4.3 mEq/L (ref 3.5–5.1)
Sodium: 141 mEq/L (ref 135–145)

## 2021-08-20 LAB — HEPATIC FUNCTION PANEL
ALT: 10 U/L (ref 0–35)
AST: 17 U/L (ref 0–37)
Albumin: 3.8 g/dL (ref 3.5–5.2)
Alkaline Phosphatase: 83 U/L (ref 39–117)
Bilirubin, Direct: 0.1 mg/dL (ref 0.0–0.3)
Total Bilirubin: 0.7 mg/dL (ref 0.2–1.2)
Total Protein: 7.4 g/dL (ref 6.0–8.3)

## 2021-08-20 LAB — IBC PANEL
Iron: 61 ug/dL (ref 42–145)
Saturation Ratios: 19 % — ABNORMAL LOW (ref 20.0–50.0)
TIBC: 320.6 ug/dL (ref 250.0–450.0)
Transferrin: 229 mg/dL (ref 212.0–360.0)

## 2021-08-20 LAB — TSH: TSH: 5.83 u[IU]/mL — ABNORMAL HIGH (ref 0.35–5.50)

## 2021-08-20 LAB — HEMOGLOBIN A1C: Hgb A1c MFr Bld: 6.5 % (ref 4.6–6.5)

## 2021-08-20 LAB — FERRITIN: Ferritin: 59.3 ng/mL (ref 10.0–291.0)

## 2021-08-20 LAB — VITAMIN D 25 HYDROXY (VIT D DEFICIENCY, FRACTURES): VITD: 57.63 ng/mL (ref 30.00–100.00)

## 2021-08-20 LAB — VITAMIN B12: Vitamin B-12: 946 pg/mL — ABNORMAL HIGH (ref 211–911)

## 2021-08-20 MED ORDER — LEVOTHYROXINE SODIUM 25 MCG PO TABS: 25.0000 ug | ORAL_TABLET | Freq: Every day | ORAL | 3 refills | Status: DC

## 2021-08-20 NOTE — Progress Notes (Unsigned)
Patient ID: Deanna Wall, female   DOB: Dec 20, 1931, 88 y.o.   MRN: [id number]         Chief Complaint:: wellness exam and low vit d, anemia, bradycardia, MS now wheelchair bound       HPI:  Deanna Wall is a 88 y.o. female here for wellness ; declines covid booster, shingrix, tdap and DXA , o/w up to date                        Also states "I've been through enough" and declines cardiology and neurology referrals today in light of encountering low HR 44 today, and hx of MS.  Pt denies chest pain, increased sob or doe, wheezing, orthopnea, PND, increased LE swelling, palpitations, dizziness or syncope.   Pt denies polydipsia, polyuria, or new focal neuro s/s.    Pt denies fever, wt loss, night sweats, loss of appetite, or other constitutional symptoms  Has no recent overt bleeding.  Not taking Vit D.  No other new complaints, here with daughter today   Wt Readings from Last 3 Encounters:  05/13/20 203 lb 11.3 oz (92.4 kg)  05/10/19 250 lb (113.4 kg)  05/18/18 250 lb (113.4 kg)   BP Readings from Last 3 Encounters:  08/20/21 130/70  02/17/21 132/70  10/02/20 130/80   Immunization History  Administered Date(s) Administered   Fluad Quad(high Dose 65+) 02/17/2021   Influenza Split 12/31/2010, 12/07/2011   Influenza Whole 03/23/2005, 12/09/2007, 12/17/2008, 02/04/2010   Influenza, High Dose Seasonal PF 12/23/2016, 12/26/2018   Influenza,inj,Quad PF,6+ Mos 12/15/2012, 11/09/2013, 12/25/2014, 11/13/2015, 01/14/2018   Influenza-Unspecified 12/26/2019   PFIZER(Purple Top)SARS-COV-2 Vaccination 04/12/2019, 05/03/2019, 02/09/2020   Pneumococcal Conjugate-13 05/12/2013   Pneumococcal Polysaccharide-23 03/23/2005, 12/31/2010   Td 03/24/1999, 02/04/2010   Zoster, Live 12/09/2007   There are no preventive care reminders to display for this patient.     Past Medical History:  Diagnosis Date   ANEMIA, PERNICIOUS 01/30/2009   Cataracts, bilateral    DEGENERATIVE DISC DISEASE 10/28/2006    ECZEMA 10/28/2006   Gait disorder    Glaucoma    HYPERLIPIDEMIA 02/16/2010   HYPERTENSION 10/28/2006   Impaired glucose tolerance 12/31/2010   MS (multiple sclerosis) ([place])    Multiple sclerosis ([place]) 10/28/2006   Obesity    OSTEOARTHRITIS, KNEES, BILATERAL, SEVERE 06/14/2009   Sebaceous cyst    chest    VITAMIN B12 DEFICIENCY 06/14/2009   Past Surgical History:  Procedure Laterality Date   COLONOSCOPY     ORIF FEMUR FRACTURE Right 05/13/2020   Procedure: OPEN REDUCTION INTERNAL FIXATION (ORIF) DISTAL FEMUR FRACTURE;  Surgeon: Thornton Park, MD;  Location: [facility] ORS;  Service: Orthopedics;  Laterality: Right;   TUBAL LIGATION  1958   UPPER GASTROINTESTINAL ENDOSCOPY      reports that she has never smoked. She has never used smokeless tobacco. She reports that she does not drink alcohol and does not use drugs. family history includes Arthritis in her mother and sister; Bone cancer in her mother; Cancer in her mother; Diabetes in her sister. Allergies  Allergen Reactions   Atorvastatin     REACTION: feels funny in face and head   Current Outpatient Medications on File Prior to Visit  Medication Sig Dispense Refill   amLODipine (NORVASC) 5 MG tablet Take 1 tablet by mouth once daily 90 tablet 0   aspirin 81 MG tablet Take 81 mg by mouth daily.     cholecalciferol (VITAMIN D3) 25 MCG (1000 UNIT)  tablet Take 2,000 Units by mouth daily.     citalopram (CELEXA) 10 MG tablet Take 1 tablet by mouth once daily 90 tablet 3   cloNIDine (CATAPRES) 0.3 MG tablet Take 1 tablet by mouth twice daily 180 tablet 3   diclofenac sodium (VOLTAREN) 1 % GEL Apply 2 g topically 4 (four) times daily as needed. 200 g 5   dorzolamide (TRUSOPT) 2 % ophthalmic solution 1 drop 2 (two) times daily.     ezetimibe (ZETIA) 10 MG tablet Take 1 tablet by mouth once daily 90 tablet 0   furosemide (LASIX) 20 MG tablet TAKE 1 TABLET BY MOUTH ONCE DAILY AS NEEDED FOR  SWELLING 90 tablet 0   latanoprost (XALATAN) 0.005 %  ophthalmic solution INSTILL 1 DROP IN BOTH EYES AT BEDTIME. KEEP THE BOTTLE IN THE FRIDGE UNTIL YOU OPEN IT.  3   nitrofurantoin (MACRODANTIN) 50 MG capsule Take 1 capsule by mouth once daily 30 capsule 5   polyethylene glycol powder (GLYCOLAX/MIRALAX) 17 GM/SCOOP powder Take 17 g by mouth 2 (two) times daily as needed. 3350 g 1   potassium chloride (KLOR-CON) 10 MEQ tablet Take 1 tablet by mouth once daily 90 tablet 2   vitamin B-12 (CYANOCOBALAMIN) 500 MCG tablet Take 500 mcg by mouth daily.     No current facility-administered medications on file prior to visit.        ROS:  All others reviewed and negative.  Objective        PE:  BP 130/70 (BP Location: Left Arm, Patient Position: Sitting, Cuff Size: Large)   Pulse (!) 44   Temp 98 F (36.7 C) (Oral)   Ht 5\' 7"  (1.702 m)   SpO2 98%   BMI 31.90 kg/m                 Constitutional: Pt appears in NAD               HENT: Head: NCAT.                Right Ear: External ear normal.                 Left Ear: External ear normal.                Eyes: . Pupils are equal, round, and reactive to light. Conjunctivae and EOM are normal               Nose: without d/c or deformity               Neck: Neck supple. Gross normal ROM               Cardiovascular: Normal rate and regular rhythm.                 Pulmonary/Chest: Effort normal and breath sounds without rales or wheezing.                Abd:  Soft, NT, ND, + BS, no organomegaly               Neurological: Pt is alert. At baseline orientation, motor grossly intact               Skin: Skin is warm. No rashes, no other new lesions, LE edema - trace bilateral               Psychiatric: Pt behavior is normal without agitation   Micro: none  Cardiac tracings I have  personally interpreted today:  none  Pertinent Radiological findings (summarize): none   Lab Results  Component Value Date   WBC 7.2 08/20/2021   HGB 11.5 (L) 08/20/2021   HCT 35.2 (L) 08/20/2021   PLT 307.0  08/20/2021   GLUCOSE 112 (H) 08/20/2021   CHOL 188 08/20/2021   TRIG 95.0 08/20/2021   HDL 59.20 08/20/2021   LDLDIRECT 180.0 01/29/2010   LDLCALC 109 (H) 08/20/2021   ALT 10 08/20/2021   AST 17 08/20/2021   NA 141 08/20/2021   K 4.3 08/20/2021   CL 106 08/20/2021   CREATININE 0.90 08/20/2021   BUN 15 08/20/2021   CO2 29 08/20/2021   TSH 5.83 (H) 08/20/2021   INR 1.2 05/13/2020   HGBA1C 6.5 08/20/2021   MICROALBUR <0.7 08/20/2021   Assessment/Plan:  CRISLYNN POLKINGHORNE is a 88 y.o. Black or African American [2] female with  has a past medical history of ANEMIA, PERNICIOUS (01/30/2009), Cataracts, bilateral, DEGENERATIVE DISC DISEASE (10/28/2006), ECZEMA (10/28/2006), Gait disorder, Glaucoma, HYPERLIPIDEMIA (02/16/2010), HYPERTENSION (10/28/2006), Impaired glucose tolerance (12/31/2010), MS (multiple sclerosis) ([place]), Multiple sclerosis ([place]) (10/28/2006), Obesity, OSTEOARTHRITIS, KNEES, BILATERAL, SEVERE (06/14/2009), Sebaceous cyst, and VITAMIN B12 DEFICIENCY (06/14/2009).  Vitamin D deficiency Last vitamin D Lab Results  Component Value Date   VD25OH 29.32 (L) 08/12/2020   Low, to start oral replacement   Encounter for well adult exam with abnormal findings Age and sex appropriate education and counseling updated with regular exercise and diet Referrals for preventative services - declines DXA Immunizations addressed - declines covid booster, tdap, shingrix Smoking counseling  - none needed Evidence for depression or other mood disorder - none significant Most recent labs reviewed. I have personally reviewed and have noted: 1) the patient's medical and social history 2) The patient's current medications and supplements 3) The patient's height, weight, and BMI have been recorded in the chart   Iron deficiency anemia Lab Results  Component Value Date   WBC 7.2 08/20/2021   HGB 11.5 (L) 08/20/2021   HCT 35.2 (L) 08/20/2021   MCV 87.1 08/20/2021   PLT 307.0 08/20/2021  mld  anemia, MCV ok, now for f/U iron lab   Impaired glucose tolerance Lab Results  Component Value Date   HGBA1C 6.5 08/20/2021   Stable, pt to continue current medical treatment  - diet, wt control   Hyperlipidemia Lab Results  Component Value Date   LDLCALC 109 (H) 08/20/2021   Mild uncontrolled, goal ldl < 100, pt to continue current zetia 10 mg as has been statin intolerant   Essential hypertension BP Readings from Last 3 Encounters:  08/20/21 130/70  02/17/21 132/70  10/02/20 130/80   Stable, pt to continue medical treatment norvasc 5, catapress 0.3   B12 deficiency Lab Results  Component Value Date   VITAMINB12 946 (H) 08/20/2021   Stable, cont oral replacement - b12 1000 mcg qd   Bradycardia Chronic persistent stable,  to f/u any worsening symptoms or concerns; decliens ECG and cardiac monitor  Multiple sclerosis Declines referral for f/u MS for now Followup: Return in about 6 months (around 02/19/2022).  Cathlean Cower, MD 08/22/2021 9:04 PM [facility]

## 2021-08-20 NOTE — Patient Instructions (Signed)

## 2021-08-20 NOTE — Assessment & Plan Note (Signed)
Last vitamin D Lab Results  Component Value Date   VD25OH 29.32 (L) 08/12/2020   Low, to start oral replacement

## 2021-08-22 ENCOUNTER — Encounter: Payer: Self-pay | Admitting: Internal Medicine

## 2021-08-22 NOTE — Assessment & Plan Note (Signed)
Age and sex appropriate education and counseling updated with regular exercise and diet Referrals for preventative services - declines DXA Immunizations addressed - declines covid booster, tdap, shingrix Smoking counseling  - none needed Evidence for depression or other mood disorder - none significant Most recent labs reviewed. I have personally reviewed and have noted: 1) the patient's medical and social history 2) The patient's current medications and supplements 3) The patient's height, weight, and BMI have been recorded in the chart

## 2021-08-22 NOTE — Assessment & Plan Note (Signed)
Declines referral for f/u MS for now

## 2021-08-22 NOTE — Assessment & Plan Note (Signed)
Lab Results  Component Value Date   HGBA1C 6.5 08/20/2021   Stable, pt to continue current medical treatment  - diet, wt control

## 2021-08-22 NOTE — Assessment & Plan Note (Addendum)
Chronic persistent stable,  to f/u any worsening symptoms or concerns; decliens ECG and cardiac monitor

## 2021-08-22 NOTE — Assessment & Plan Note (Signed)
Lab Results  Component Value Date   LDLCALC 109 (H) 08/20/2021   Mild uncontrolled, goal ldl < 100, pt to continue current zetia 10 mg as has been statin intolerant

## 2021-08-22 NOTE — Assessment & Plan Note (Signed)
BP Readings from Last 3 Encounters:  08/20/21 130/70  02/17/21 132/70  10/02/20 130/80   Stable, pt to continue medical treatment norvasc 5, catapress 0.3

## 2021-08-22 NOTE — Assessment & Plan Note (Signed)
Lab Results  Component Value Date   VITAMINB12 946 (H) 08/20/2021   Stable, cont oral replacement - b12 1000 mcg qd

## 2021-08-22 NOTE — Assessment & Plan Note (Signed)
Lab Results  Component Value Date   WBC 7.2 08/20/2021   HGB 11.5 (L) 08/20/2021   HCT 35.2 (L) 08/20/2021   MCV 87.1 08/20/2021   PLT 307.0 08/20/2021  mld anemia, MCV ok, now for f/U iron lab

## 2021-09-02 ENCOUNTER — Other Ambulatory Visit: Payer: Self-pay | Admitting: Internal Medicine

## 2021-09-02 NOTE — Telephone Encounter (Signed)
Please refill as per office routine med refill policy (all routine meds to be refilled for 3 mo or monthly (per pt preference) up to one year from last visit, then month to month grace period for 3 mo, then further med refills will have to be denied) ? ?

## 2021-09-12 DIAGNOSIS — H0102A Squamous blepharitis right eye, upper and lower eyelids: Secondary | ICD-10-CM | POA: Diagnosis not present

## 2021-09-12 DIAGNOSIS — H04123 Dry eye syndrome of bilateral lacrimal glands: Secondary | ICD-10-CM | POA: Diagnosis not present

## 2021-09-12 DIAGNOSIS — H401232 Low-tension glaucoma, bilateral, moderate stage: Secondary | ICD-10-CM | POA: Diagnosis not present

## 2021-09-12 DIAGNOSIS — H0102B Squamous blepharitis left eye, upper and lower eyelids: Secondary | ICD-10-CM | POA: Diagnosis not present

## 2021-10-10 ENCOUNTER — Ambulatory Visit (INDEPENDENT_AMBULATORY_CARE_PROVIDER_SITE_OTHER): Payer: Medicare Other

## 2021-10-10 DIAGNOSIS — Z0000 Encounter for general adult medical examination without abnormal findings: Secondary | ICD-10-CM | POA: Diagnosis not present

## 2021-10-10 NOTE — Patient Instructions (Signed)
Ms. Storlie , Thank you for taking time to come for your Medicare Wellness Visit. I appreciate your ongoing commitment to your health goals. Please review the following plan we discussed and let me know if I can assist you in the future.   Screening recommendations/referrals: Colonoscopy: no longer required  Mammogram: no longer required  Bone Density: declined by patient  Recommended yearly ophthalmology/optometry visit for glaucoma screening and checkup Recommended yearly dental visit for hygiene and checkup  Vaccinations: Influenza vaccine: completed  Pneumococcal vaccine: completed  Tdap vaccine: due  Shingles vaccine: will consider     Advanced directives: yes   Conditions/risks identified: none   Next appointment: none    Preventive Care 65 Years and Older, Female Preventive care refers to lifestyle choices and visits with your health care provider that can promote health and wellness. What does preventive care include? A yearly physical exam. This is also called an annual well check. Dental exams once or twice a year. Routine eye exams. Ask your health care provider how often you should have your eyes checked. Personal lifestyle choices, including: Daily care of your teeth and gums. Regular physical activity. Eating a healthy diet. Avoiding tobacco and drug use. Limiting alcohol use. Practicing safe sex. Taking low-dose aspirin every day. Taking vitamin and mineral supplements as recommended by your health care provider. What happens during an annual well check? The services and screenings done by your health care provider during your annual well check will depend on your age, overall health, lifestyle risk factors, and family history of disease. Counseling  Your health care provider may ask you questions about your: Alcohol use. Tobacco use. Drug use. Emotional well-being. Home and relationship well-being. Sexual activity. Eating habits. History of  falls. Memory and ability to understand (cognition). Work and work Astronomer. Reproductive health. Screening  You may have the following tests or measurements: Height, weight, and BMI. Blood pressure. Lipid and cholesterol levels. These may be checked every 5 years, or more frequently if you are over 1 years old. Skin check. Lung cancer screening. You may have this screening every year starting at age 57 if you have a 30-pack-year history of smoking and currently smoke or have quit within the past 15 years. Fecal occult blood test (FOBT) of the stool. You may have this test every year starting at age 55. Flexible sigmoidoscopy or colonoscopy. You may have a sigmoidoscopy every 5 years or a colonoscopy every 10 years starting at age 8. Hepatitis C blood test. Hepatitis B blood test. Sexually transmitted disease (STD) testing. Diabetes screening. This is done by checking your blood sugar (glucose) after you have not eaten for a while (fasting). You may have this done every 1-3 years. Bone density scan. This is done to screen for osteoporosis. You may have this done starting at age 70. Mammogram. This may be done every 1-2 years. Talk to your health care provider about how often you should have regular mammograms. Talk with your health care provider about your test results, treatment options, and if necessary, the need for more tests. Vaccines  Your health care provider may recommend certain vaccines, such as: Influenza vaccine. This is recommended every year. Tetanus, diphtheria, and acellular pertussis (Tdap, Td) vaccine. You may need a Td booster every 10 years. Zoster vaccine. You may need this after age 84. Pneumococcal 13-valent conjugate (PCV13) vaccine. One dose is recommended after age 54. Pneumococcal polysaccharide (PPSV23) vaccine. One dose is recommended after age 8. Talk to your health care provider  about which screenings and vaccines you need and how often you need  them. This information is not intended to replace advice given to you by your health care provider. Make sure you discuss any questions you have with your health care provider. Document Released: 04/05/2015 Document Revised: 11/27/2015 Document Reviewed: 01/08/2015 Elsevier Interactive Patient Education  2017 [place] Prevention in the Home Falls can cause injuries. They can happen to people of all ages. There are many things you can do to make your home safe and to help prevent falls. What can I do on the outside of my home? Regularly fix the edges of walkways and driveways and fix any cracks. Remove anything that might make you trip as you walk through a door, such as a raised step or threshold. Trim any bushes or trees on the path to your home. Use bright outdoor lighting. Clear any walking paths of anything that might make someone trip, such as rocks or tools. Regularly check to see if handrails are loose or broken. Make sure that both sides of any steps have handrails. Any raised decks and porches should have guardrails on the edges. Have any leaves, snow, or ice cleared regularly. Use sand or salt on walking paths during winter. Clean up any spills in your garage right away. This includes oil or grease spills. What can I do in the bathroom? Use night lights. Install grab bars by the toilet and in the tub and shower. Do not use towel bars as grab bars. Use non-skid mats or decals in the tub or shower. If you need to sit down in the shower, use a plastic, non-slip stool. Keep the floor dry. Clean up any water that spills on the floor as soon as it happens. Remove soap buildup in the tub or shower regularly. Attach bath mats securely with double-sided non-slip rug tape. Do not have throw rugs and other things on the floor that can make you trip. What can I do in the bedroom? Use night lights. Make sure that you have a light by your bed that is easy to reach. Do not use  any sheets or blankets that are too big for your bed. They should not hang down onto the floor. Have a firm chair that has side arms. You can use this for support while you get dressed. Do not have throw rugs and other things on the floor that can make you trip. What can I do in the kitchen? Clean up any spills right away. Avoid walking on wet floors. Keep items that you use a lot in easy-to-reach places. If you need to reach something above you, use a strong step stool that has a grab bar. Keep electrical cords out of the way. Do not use floor polish or wax that makes floors slippery. If you must use wax, use non-skid floor wax. Do not have throw rugs and other things on the floor that can make you trip. What can I do with my stairs? Do not leave any items on the stairs. Make sure that there are handrails on both sides of the stairs and use them. Fix handrails that are broken or loose. Make sure that handrails are as long as the stairways. Check any carpeting to make sure that it is firmly attached to the stairs. Fix any carpet that is loose or worn. Avoid having throw rugs at the top or bottom of the stairs. If you do have throw rugs, attach them to the floor  with carpet tape. Make sure that you have a light switch at the top of the stairs and the bottom of the stairs. If you do not have them, ask someone to add them for you. What else can I do to help prevent falls? Wear shoes that: Do not have high heels. Have rubber bottoms. Are comfortable and fit you well. Are closed at the toe. Do not wear sandals. If you use a stepladder: Make sure that it is fully opened. Do not climb a closed stepladder. Make sure that both sides of the stepladder are locked into place. Ask someone to hold it for you, if possible. Clearly mark and make sure that you can see: Any grab bars or handrails. First and last steps. Where the edge of each step is. Use tools that help you move around (mobility aids)  if they are needed. These include: Canes. Walkers. Scooters. Crutches. Turn on the lights when you go into a dark area. Replace any light bulbs as soon as they burn out. Set up your furniture so you have a clear path. Avoid moving your furniture around. If any of your floors are uneven, fix them. If there are any pets around you, be aware of where they are. Review your medicines with your doctor. Some medicines can make you feel dizzy. This can increase your chance of falling. Ask your doctor what other things that you can do to help prevent falls. This information is not intended to replace advice given to you by your health care provider. Make sure you discuss any questions you have with your health care provider. Document Released: 01/03/2009 Document Revised: 08/15/2015 Document Reviewed: 04/13/2014 Elsevier Interactive Patient Education  2017 [employer].

## 2021-10-10 NOTE — Progress Notes (Signed)
Subjective:   Deanna Wall is a 88 y.o. female who presents for Medicare Annual (Subsequent) preventive examination.   I connected with Synthia Innocent  today by telephone and verified that I am speaking with the correct person using two identifiers. Location patient: home Location provider: work Persons participating in the virtual visit: patient, provider.   I discussed the limitations, risks, security and privacy concerns of performing an evaluation and management service by telephone and the availability of in person appointments. I also discussed with the patient that there may be a patient responsible charge related to this service. The patient expressed understanding and verbally consented to this telephonic visit.    Interactive audio and video telecommunications were attempted between this provider and patient, however failed, due to patient having technical difficulties OR patient did not have access to video capability.  We continued and completed visit with audio only.    Review of Systems     Cardiac Risk Factors include: advanced age (>98men, >83 women);dyslipidemia     Objective:    Today's Vitals   There is no height or weight on file to calculate BMI.     10/10/2021    2:06 PM 10/02/2020   12:56 PM 05/13/2020    1:52 AM 05/12/2020    6:00 PM 11/04/2016   11:52 AM 04/10/2015    9:28 AM 04/14/2011    9:46 AM  Advanced Directives  Does Patient Have a Medical Advance Directive? No Yes Yes No Yes Yes Patient has advance directive, copy not in chart  Type of Advance Directive  Living will Living will  Healthcare Power of Annetta;Living will Living will   Does patient want to make changes to medical advance directive?  No - Patient declined No - Patient declined      Copy of Healthcare Power of Attorney in Chart?     No - copy requested No - copy requested   Would patient like information on creating a medical advance directive? No - Patient declined           Current Medications (verified) Outpatient Encounter Medications as of 10/10/2021  Medication Sig   amLODipine (NORVASC) 5 MG tablet Take 1 tablet by mouth once daily   aspirin 81 MG tablet Take 81 mg by mouth daily.   cholecalciferol (VITAMIN D3) 25 MCG (1000 UNIT) tablet Take 2,000 Units by mouth daily.   citalopram (CELEXA) 10 MG tablet Take 1 tablet by mouth once daily   cloNIDine (CATAPRES) 0.3 MG tablet Take 1 tablet by mouth twice daily   diclofenac sodium (VOLTAREN) 1 % GEL Apply 2 g topically 4 (four) times daily as needed.   dorzolamide (TRUSOPT) 2 % ophthalmic solution 1 drop 2 (two) times daily.   ezetimibe (ZETIA) 10 MG tablet Take 1 tablet by mouth once daily   furosemide (LASIX) 20 MG tablet TAKE 1 TABLET BY MOUTH ONCE DAILY AS NEEDED FOR  SWELLING   latanoprost (XALATAN) 0.005 % ophthalmic solution INSTILL 1 DROP IN BOTH EYES AT BEDTIME. KEEP THE BOTTLE IN THE FRIDGE UNTIL YOU OPEN IT.   levothyroxine (SYNTHROID) 25 MCG tablet Take 1 tablet (25 mcg total) by mouth daily.   nitrofurantoin (MACRODANTIN) 50 MG capsule Take 1 capsule by mouth once daily   polyethylene glycol powder (GLYCOLAX/MIRALAX) 17 GM/SCOOP powder Take 17 g by mouth 2 (two) times daily as needed.   potassium chloride (KLOR-CON) 10 MEQ tablet Take 1 tablet by mouth once daily   vitamin B-12 (CYANOCOBALAMIN) 500  MCG tablet Take 500 mcg by mouth daily.   No facility-administered encounter medications on file as of 10/10/2021.    Allergies (verified) Atorvastatin   History: Past Medical History:  Diagnosis Date   ANEMIA, PERNICIOUS 01/30/2009   Cataracts, bilateral    DEGENERATIVE DISC DISEASE 10/28/2006   ECZEMA 10/28/2006   Gait disorder    Glaucoma    HYPERLIPIDEMIA 02/16/2010   HYPERTENSION 10/28/2006   Impaired glucose tolerance 12/31/2010   MS (multiple sclerosis) (HCC)    Multiple sclerosis (HCC) 10/28/2006   Obesity    OSTEOARTHRITIS, KNEES, BILATERAL, SEVERE 06/14/2009   Sebaceous cyst     chest    VITAMIN B12 DEFICIENCY 06/14/2009   Past Surgical History:  Procedure Laterality Date   COLONOSCOPY     ORIF FEMUR FRACTURE Right 05/13/2020   Procedure: OPEN REDUCTION INTERNAL FIXATION (ORIF) DISTAL FEMUR FRACTURE;  Surgeon: Juanell Fairly, MD;  Location: [facility] ORS;  Service: Orthopedics;  Laterality: Right;   TUBAL LIGATION  1958   UPPER GASTROINTESTINAL ENDOSCOPY     Family History  Problem Relation Age of Onset   Bone cancer Mother    Arthritis Mother    Cancer Mother        bone   Diabetes Sister    Arthritis Sister    Social History   Socioeconomic History   Marital status: Widowed    Spouse name: Not on file   Number of children: 5   Years of education: 11TH    Highest education level: Not on file  Occupational History   Occupation: homemaker    Employer: RETIRED  Tobacco Use   Smoking status: Never   Smokeless tobacco: Never  Substance and Sexual Activity   Alcohol use: No   Drug use: No   Sexual activity: Not on file  Other Topics Concern   Not on file  Social History Narrative   Not on file   Social Determinants of Health   Financial Resource Strain: Low Risk  (10/10/2021)   Overall Financial Resource Strain (CARDIA)    Difficulty of Paying Living Expenses: Not hard at all  Food Insecurity: No Food Insecurity (10/10/2021)   Hunger Vital Sign    Worried About Running Out of Food in the Last Year: Never true    Ran Out of Food in the Last Year: Never true  Transportation Needs: No Transportation Needs (10/10/2021)   PRAPARE - Administrator, Civil Service (Medical): No    Lack of Transportation (Non-Medical): No  Physical Activity: Insufficiently Active (10/10/2021)   Exercise Vital Sign    Days of Exercise per Week: 3 days    Minutes of Exercise per Session: 30 min  Stress: No Stress Concern Present (10/10/2021)   [employer] of Occupational Health - Occupational Stress Questionnaire    Feeling of Stress : Not at all  Social  Connections: Moderately Isolated (10/10/2021)   Social Connection and Isolation Panel [NHANES]    Frequency of Communication with Friends and Family: Three times a week    Frequency of Social Gatherings with Friends and Family: Three times a week    Attends Religious Services: More than 4 times per year    Active Member of Clubs or Organizations: No    Attends Banker Meetings: Never    Marital Status: Widowed    Tobacco Counseling Counseling given: Not Answered   Clinical Intake:  Pre-visit preparation completed: Yes  Pain : No/denies pain     Nutritional Risks: None Diabetes:  No  How often do you need to have someone help you when you read instructions, pamphlets, or other written materials from your doctor or pharmacy?: 1 - Never What is the last grade level you completed in school?: 11th grade  Diabetic?no   Interpreter Needed?: No  Information entered by :: L.Wilson,LPN   Activities of Daily Living    10/10/2021    2:10 PM  In your present state of health, do you have any difficulty performing the following activities:  Hearing? 0  Vision? 0  Difficulty concentrating or making decisions? 0  Walking or climbing stairs? 0  Dressing or bathing? 0  Doing errands, shopping? 0  Preparing Food and eating ? N  Using the Toilet? N  In the past six months, have you accidently leaked urine? N  Do you have problems with loss of bowel control? N  Managing your Medications? N  Managing your Finances? N  Housekeeping or managing your Housekeeping? N    Patient Care Team: Corwin Levins, MD as PCP - [facility] as Consulting Physician (Ophthalmology)  Indicate any recent Medical Services you may have received from other than [facility] providers in the past year (date may be approximate).     Assessment:   This is a routine wellness examination for Jamya.  Hearing/Vision screen Vision Screening - Comments:: Annual eye exams wears  glasses   Dietary issues and exercise activities discussed: Current Exercise Habits: The patient does not participate in regular exercise at present, Exercise limited by: neurologic condition(s)   Goals Addressed   None    Depression Screen    10/10/2021    2:07 PM 10/10/2021    2:05 PM 08/20/2021   11:22 AM 08/20/2021   11:11 AM 03/03/2021    2:42 PM 12/24/2020   11:08 AM 10/02/2020   12:26 PM  PHQ 2/9 Scores  PHQ - 2 Score 0 0 0 0 1 0 0  PHQ- 9 Score    0       Fall Risk    10/10/2021    2:07 PM 08/20/2021   11:22 AM 08/20/2021   11:11 AM 02/04/2021    8:16 PM 10/02/2020   12:25 PM  Fall Risk   Falls in the past year? 0 0 0 1 1  Number falls in past yr: 0 0 0 0 0  Injury with Fall? 0 0 0 1 1  Risk for fall due to :   Impaired mobility;Impaired balance/gait History of fall(s);Impaired balance/gait;Impaired mobility   Risk for fall due to: Comment wheelchair      Follow up Falls evaluation completed;Education provided        FALL RISK PREVENTION PERTAINING TO THE HOME:  Any stairs in or around the home? No  If so, are there any without handrails? No  Home free of loose throw rugs in walkways, pet beds, electrical cords, etc? Yes  Adequate lighting in your home to reduce risk of falls? Yes   ASSISTIVE DEVICES UTILIZED TO PREVENT FALLS:  Life alert? No  Use of a cane, walker or w/c? Yes  Grab bars in the bathroom? Yes  Shower chair or bench in shower? Yes  Elevated toilet seat or a handicapped toilet? Yes     Cognitive Function:  Normal cognitive status assessed by telephone conversation  by this Nurse Health Advisor. No abnormalities found.      11/04/2016   11:53 AM  MMSE - Mini Mental State Exam  Orientation to time  5  Orientation to Place 5  Registration 3  Attention/ Calculation 5  Recall 2  Language- name 2 objects 2  Language- repeat 1  Language- follow 3 step command 3  Language- read & follow direction 1  Write a sentence 1  Copy design 1  Total  score 29        Immunizations Immunization History  Administered Date(s) Administered   Fluad Quad(high Dose 65+) 02/17/2021   Influenza Split 12/31/2010, 12/07/2011   Influenza Whole 03/23/2005, 12/09/2007, 12/17/2008, 02/04/2010   Influenza, High Dose Seasonal PF 12/23/2016, 12/26/2018   Influenza,inj,Quad PF,6+ Mos 12/15/2012, 11/09/2013, 12/25/2014, 11/13/2015, 01/14/2018   Influenza-Unspecified 12/26/2019   PFIZER(Purple Top)SARS-COV-2 Vaccination 04/12/2019, 05/03/2019, 02/09/2020   Pneumococcal Conjugate-13 05/12/2013   Pneumococcal Polysaccharide-23 03/23/2005, 12/31/2010   Td 03/24/1999, 02/04/2010   Zoster, Live 12/09/2007    TDAP status: Due, Education has been provided regarding the importance of this vaccine. Advised may receive this vaccine at local pharmacy or Health Dept. Aware to provide a copy of the vaccination record if obtained from local pharmacy or Health Dept. Verbalized acceptance and understanding.  Flu Vaccine status: Up to date  Pneumococcal vaccine status: Up to date  Covid-19 vaccine status: Completed vaccines  Qualifies for Shingles Vaccine? Yes   Zostavax completed No   Shingrix Completed?: No.    Education has been provided regarding the importance of this vaccine. Patient has been advised to call insurance company to determine out of pocket expense if they have not yet received this vaccine. Advised may also receive vaccine at local pharmacy or Health Dept. Verbalized acceptance and understanding.  Screening Tests Health Maintenance  Topic Date Due   COVID-19 Vaccine (4 - Pfizer series) 04/05/2020   Zoster Vaccines- Shingrix (1 of 2) 11/20/2021 (Originally 11/26/1983)   DEXA SCAN  08/21/2022 (Originally 11/26/1998)   TETANUS/TDAP  08/21/2022 (Originally 02/05/2020)   INFLUENZA VACCINE  10/21/2021   Pneumonia Vaccine 71+ Years old  Completed   HPV VACCINES  Aged Out    Health Maintenance  Health Maintenance Due  Topic Date Due   COVID-19  Vaccine (4 - Pfizer series) 04/05/2020    Colorectal cancer screening: No longer required.   Mammogram status: No longer required due to age.  Bone Density status: Ordered declined by patient . Pt provided with contact info and advised to call to schedule appt.  Lung Cancer Screening: (Low Dose CT Chest recommended if Age 83-80 years, 30 pack-year currently smoking OR have quit w/in 15years.) does not qualify.   Lung Cancer Screening Referral: n/a  Additional Screening:  Hepatitis C Screening: does not qualify;   Vision Screening: Recommended annual ophthalmology exams for early detection of glaucoma and other disorders of the eye. Is the patient up to date with their annual eye exam?  Yes  Who is the provider or what is the name of the office in which the patient attends annual eye exams? Dr.Groat  If pt is not established with a provider, would they like to be referred to a provider to establish care? No .   Dental Screening: Recommended annual dental exams for proper oral hygiene  Community Resource Referral / Chronic Care Management: CRR required this visit?  No   CCM required this visit?  No      Plan:     I have personally reviewed and noted the following in the patient's chart:   Medical and social history Use of alcohol, tobacco or illicit drugs  Current medications and supplements including opioid prescriptions.  Functional ability and status Nutritional status Physical activity Advanced directives List of other physicians Hospitalizations, surgeries, and ER visits in previous 12 months Vitals Screenings to include cognitive, depression, and falls Referrals and appointments  In addition, I have reviewed and discussed with patient certain preventive protocols, quality metrics, and best practice recommendations. A written personalized care plan for preventive services as well as general preventive health recommendations were provided to patient.     March Rummage, LPN   08/25/5407   Nurse Notes: none

## 2021-10-27 ENCOUNTER — Other Ambulatory Visit: Payer: Self-pay | Admitting: Internal Medicine

## 2021-10-27 NOTE — Telephone Encounter (Signed)
Please refill as per office routine med refill policy (all routine meds to be refilled for 3 mo or monthly (per pt preference) up to one year from last visit, then month to month grace period for 3 mo, then further med refills will have to be denied) ? ?

## 2021-11-18 ENCOUNTER — Other Ambulatory Visit: Payer: Self-pay | Admitting: Occupational Therapy

## 2021-11-18 NOTE — Patient Outreach (Signed)
Aging Gracefully Program  OT FINAL Visit  11/18/2021  Deanna Wall 09-Sep-1931 [id number]  Visit:  5- Fifth Visit  Start Time:  0109 End Time:  1705 Total Minutes:  30  Readiness to Change:  Readiness to Change Score: 10  Durable Medical Equipment: Adaptive Equipment: Sock Aid Adaptive Equipment Distribution Date: 11/18/21  Patient Education: Education Provided: Yes Education Details: Tips for Aging at home booklet Person(s) Educated: Patient Comprehension: Verbalized Understanding  Goals:  Goals Addressed             This Visit's Progress    COMPLETED: Patient Stated       She would be interested in trying a sock aid to see if this made getting socks on easier.MET  Action Planning--Dressing Target Problem Area: Can put on socks but requires increased effort  Why Problem May Occur: Decreased flexibility  Target Goal: Independence/increased for putting on socks     STRATEGIES Saving Your Energy: DO: DON'T:  Use appropriate adaptive equipment:   sock aide    Keep all items you'll need within easy reach     Simplifying the way you set up tasks or daily routines: DO: DON'T:  Gather all items before getting started--so you are not having to move around a lot when working on getting dressed.    Wear clothing that is easily manipulated      Practice It is important to practice the strategies so we can determine if they will be effective in helping to reach your goal. Follow these specific recommendations:  1.Use the AE regularly so you get used to using it   If a strategy does not work the first time, try it again and again (and maybe again).    Golden Circle, OTR/L                         8/29//2023     COMPLETED: Patient Stated       She would like to be able to get in and out of her house by herself (permanent ramp with upper deck area would help this) MET  Client is very happy with her new deck/ramp and has been using it readily when her family takes  her out.     COMPLETED: Patient Stated       She would like to consider widening bathroom and master bedroom doorways to make it easier to get in and out with wheelchair. MET--she decided only on having her bathroom door widen        Post Clinical Reasoning: Clinician View Of Client Situation:: [name] continues to do well for herself and is getting out with family to go shopping and went to the beach with them in May of this year. She is happy with all of her home modifications. She will be celebrating her 89th BD on September 5ht with numerous extended family. She and her family have been wonderful to work with. Client View Of His/Her Situation:: [name] really likes the sock aid I showed her and gave to her. She is appreciative of all the work that [facility] and the Aging Gracefully program has done for her. Next Visit Plan:: This was our last visit  Golden Circle, OTR/L [place] [phone] Office [phone]

## 2021-11-18 NOTE — Patient Instructions (Addendum)
She would be interested in trying a sock aid to see if this made getting socks on easier.MET  Action Planning--Dressing Target Problem Area: Can put on socks but requires increased effort  Why Problem May Occur: Decreased flexibility  Target Goal: Independence/increased for putting on socks     STRATEGIES Saving Your Energy: DO: DON'T:  Use appropriate adaptive equipment:   sock aide    Keep all items you'll need within easy reach     Simplifying the way you set up tasks or daily routines: DO: DON'T:  Gather all items before getting started--so you are not having to move around a lot when working on getting dressed.    Wear clothing that is easily manipulated      Practice It is important to practice the strategies so we can determine if they will be effective in helping to reach your goal. Follow these specific recommendations:  1.Use the AE regularly so you get used to using it   If a strategy does not work the first time, try it again and again (and maybe again).    Golden Circle, OTR/L                         8/29//2023   2. She would like to be able to get in and out of her house by herself (permanent ramp with upper deck area would help this) MET  Client is very happy with her new deck/ramp and has been using it readily when her family takes her out.   3. She would like to consider widening bathroom and master bedroom doorways to make it easier to get in and out with wheelchair. MET--she decided only on having her bathroom door widen

## 2021-11-27 ENCOUNTER — Other Ambulatory Visit: Payer: Self-pay | Admitting: Internal Medicine

## 2021-11-27 NOTE — Telephone Encounter (Signed)
Please refill as per office routine med refill policy (all routine meds to be refilled for 3 mo or monthly (per pt preference) up to one year from last visit, then month to month grace period for 3 mo, then further med refills will have to be denied) ? ?

## 2021-12-02 ENCOUNTER — Other Ambulatory Visit: Payer: Self-pay | Admitting: Internal Medicine

## 2022-02-18 ENCOUNTER — Encounter: Payer: Self-pay | Admitting: Internal Medicine

## 2022-02-18 ENCOUNTER — Ambulatory Visit (INDEPENDENT_AMBULATORY_CARE_PROVIDER_SITE_OTHER): Payer: Medicare Other | Admitting: Internal Medicine

## 2022-02-18 ENCOUNTER — Other Ambulatory Visit: Payer: Self-pay

## 2022-02-18 ENCOUNTER — Ambulatory Visit: Payer: Medicare Other | Admitting: Internal Medicine

## 2022-02-18 VITALS — BP 138/74 | HR 52 | Temp 97.8°F

## 2022-02-18 DIAGNOSIS — E782 Mixed hyperlipidemia: Secondary | ICD-10-CM | POA: Diagnosis not present

## 2022-02-18 DIAGNOSIS — M25512 Pain in left shoulder: Secondary | ICD-10-CM | POA: Diagnosis not present

## 2022-02-18 DIAGNOSIS — E559 Vitamin D deficiency, unspecified: Secondary | ICD-10-CM

## 2022-02-18 DIAGNOSIS — M25511 Pain in right shoulder: Secondary | ICD-10-CM | POA: Diagnosis not present

## 2022-02-18 DIAGNOSIS — E039 Hypothyroidism, unspecified: Secondary | ICD-10-CM

## 2022-02-18 DIAGNOSIS — I10 Essential (primary) hypertension: Secondary | ICD-10-CM

## 2022-02-18 DIAGNOSIS — R7302 Impaired glucose tolerance (oral): Secondary | ICD-10-CM | POA: Diagnosis not present

## 2022-02-18 LAB — HEPATIC FUNCTION PANEL
ALT: 12 U/L (ref 0–35)
AST: 17 U/L (ref 0–37)
Albumin: 3.8 g/dL (ref 3.5–5.2)
Alkaline Phosphatase: 82 U/L (ref 39–117)
Bilirubin, Direct: 0.1 mg/dL (ref 0.0–0.3)
Total Bilirubin: 0.6 mg/dL (ref 0.2–1.2)
Total Protein: 7.3 g/dL (ref 6.0–8.3)

## 2022-02-18 LAB — TSH: TSH: 3.63 u[IU]/mL (ref 0.35–5.50)

## 2022-02-18 LAB — LIPID PANEL
Cholesterol: 181 mg/dL (ref 0–200)
HDL: 50.9 mg/dL (ref >39.00)
LDL Cholesterol: 112 mg/dL — ABNORMAL HIGH (ref 0–99)
NonHDL: 130.58
Total CHOL/HDL Ratio: 4
Triglycerides: 93 mg/dL (ref 0.0–149.0)
VLDL: 18.6 mg/dL (ref 0.0–40.0)

## 2022-02-18 LAB — T4, FREE: Free T4: 0.93 ng/dL (ref 0.60–1.60)

## 2022-02-18 LAB — HEMOGLOBIN A1C: Hgb A1c MFr Bld: 6.7 % — ABNORMAL HIGH (ref 4.6–6.5)

## 2022-02-18 LAB — SEDIMENTATION RATE: Sed Rate: 68 mm/hr — ABNORMAL HIGH (ref 0–30)

## 2022-02-18 LAB — VITAMIN D 25 HYDROXY (VIT D DEFICIENCY, FRACTURES): VITD: 68.27 ng/mL (ref 30.00–100.00)

## 2022-02-18 NOTE — Patient Instructions (Signed)
Please have your Shingrix (shingles) shots done at your local pharmacy.  Please continue all other medications as before, and refills have been done if requested.  Please have the pharmacy call with any other refills you may need.  Please continue your efforts at being more active, low cholesterol diet, and weight control.  Please keep your appointments with your specialists as you may have planned  Please go to the LAB at the blood drawing area for the tests to be done  You will be contacted by phone if any changes need to be made immediately.  Otherwise, you will receive a letter about your results with an explanation, but please check with MyChart first.  Please remember to sign up for MyChart if you have not done so, as this will be important to you in the future with finding out test results, communicating by private email, and scheduling acute appointments online when needed.  Please make an Appointment to return in 6 months, or sooner if needed

## 2022-02-18 NOTE — Assessment & Plan Note (Signed)
BP Readings from Last 3 Encounters:  02/18/22 138/74  08/20/21 130/70  02/17/21 132/70   Stable, pt to continue medical treatment norvasc 5 mg qd, clonidine 0.3 bid

## 2022-02-18 NOTE — Assessment & Plan Note (Signed)
Lab Results  Component Value Date   HGBA1C 6.5 08/20/2021   Stable, pt to continue current medical treatment  - diet, wt control, excercise

## 2022-02-18 NOTE — Patient Outreach (Signed)
Aging Gracefully Program  02/18/2022  Deanna Wall Jul 22, 1931 [id number]   [facility] Interviewer made contact with patient. Aging Gracefully 70month survey completed.     Vanice Sarah Care Management Assistant [phone]

## 2022-02-18 NOTE — Assessment & Plan Note (Signed)
Last vitamin D Lab Results  Component Value Date   VD25OH 57.63 08/20/2021   Stable, cont oral replacement

## 2022-02-18 NOTE — Assessment & Plan Note (Signed)
Lab Results  Component Value Date   LDLCALC 109 (H) 08/20/2021   Uncontrolled, goal ldl < 70,  pt to continue current zetia 10 mg and lower chol diet, declines statin

## 2022-02-18 NOTE — Progress Notes (Signed)
Patient ID: Deanna Wall, female   DOB: 12-14-1931, 88 y.o.   MRN: [id number]        Chief Complaint: follow up HTN, HLD and hyperglycemia , low thyroid, bilateral shoulder pain       HPI:  Deanna Wall is a 88 y.o. female here with c/o 1-2 mo worsening bilateral shoulder pain, still has ability to raise somewhat overhead on the right, but not on the left, overall mild, and not really wanting tx such as cortisone shots.  Pt denies chest pain, increased sob or doe, wheezing, orthopnea, PND, increased LE swelling, palpitations, dizziness or syncope.   Pt denies polydipsia, polyuria, or new focal neuro s/s.   Denies hyper or hypo thyroid symptoms such as voice, skin or hair change.   Pt denies fever, wt loss, night sweats, loss of appetite, or other constitutional symptoms  Due for shingrix - wll have at the pharmacy       Wt Readings from Last 3 Encounters:  05/13/20 203 lb 11.3 oz (92.4 kg)  05/10/19 250 lb (113.4 kg)  05/18/18 250 lb (113.4 kg)   BP Readings from Last 3 Encounters:  02/18/22 138/74  08/20/21 130/70  02/17/21 132/70         Past Medical History:  Diagnosis Date   ANEMIA, PERNICIOUS 01/30/2009   Cataracts, bilateral    DEGENERATIVE DISC DISEASE 10/28/2006   ECZEMA 10/28/2006   Gait disorder    Glaucoma    HYPERLIPIDEMIA 02/16/2010   HYPERTENSION 10/28/2006   Impaired glucose tolerance 12/31/2010   MS (multiple sclerosis) ([place])    Multiple sclerosis ([place]) 10/28/2006   Obesity    OSTEOARTHRITIS, KNEES, BILATERAL, SEVERE 06/14/2009   Sebaceous cyst    chest    VITAMIN B12 DEFICIENCY 06/14/2009   Past Surgical History:  Procedure Laterality Date   COLONOSCOPY     ORIF FEMUR FRACTURE Right 05/13/2020   Procedure: OPEN REDUCTION INTERNAL FIXATION (ORIF) DISTAL FEMUR FRACTURE;  Surgeon: Thornton Park, MD;  Location: [facility] ORS;  Service: Orthopedics;  Laterality: Right;   TUBAL LIGATION  1958   UPPER GASTROINTESTINAL ENDOSCOPY      reports that she has never smoked. She  has never used smokeless tobacco. She reports that she does not drink alcohol and does not use drugs. family history includes Arthritis in her mother and sister; Bone cancer in her mother; Cancer in her mother; Diabetes in her sister. Allergies  Allergen Reactions   Atorvastatin     REACTION: feels funny in face and head   Current Outpatient Medications on File Prior to Visit  Medication Sig Dispense Refill   amLODipine (NORVASC) 5 MG tablet Take 1 tablet by mouth once daily 90 tablet 0   aspirin 81 MG tablet Take 81 mg by mouth daily.     cholecalciferol (VITAMIN D3) 25 MCG (1000 UNIT) tablet Take 2,000 Units by mouth daily.     citalopram (CELEXA) 10 MG tablet Take 1 tablet by mouth once daily 90 tablet 3   cloNIDine (CATAPRES) 0.3 MG tablet Take 1 tablet by mouth twice daily 180 tablet 3   diclofenac sodium (VOLTAREN) 1 % GEL Apply 2 g topically 4 (four) times daily as needed. 200 g 5   dorzolamide (TRUSOPT) 2 % ophthalmic solution 1 drop 2 (two) times daily.     ezetimibe (ZETIA) 10 MG tablet Take 1 tablet by mouth once daily 90 tablet 2   furosemide (LASIX) 20 MG tablet TAKE 1 TABLET BY MOUTH ONCE DAILY AS  NEEDED FOR  SWELLING 90 tablet 2   latanoprost (XALATAN) 0.005 % ophthalmic solution INSTILL 1 DROP IN BOTH EYES AT BEDTIME. KEEP THE BOTTLE IN THE FRIDGE UNTIL YOU OPEN IT.  3   levothyroxine (SYNTHROID) 25 MCG tablet Take 1 tablet (25 mcg total) by mouth daily. 90 tablet 3   nitrofurantoin (MACRODANTIN) 50 MG capsule Take 1 capsule by mouth once daily 30 capsule 5   polyethylene glycol powder (GLYCOLAX/MIRALAX) 17 GM/SCOOP powder Take 17 g by mouth 2 (two) times daily as needed. 3350 g 1   potassium chloride (KLOR-CON) 10 MEQ tablet Take 1 tablet by mouth once daily 90 tablet 2   vitamin B-12 (CYANOCOBALAMIN) 500 MCG tablet Take 500 mcg by mouth daily.     No current facility-administered medications on file prior to visit.        ROS:  All others reviewed and  negative.  Objective        PE:  BP 138/74 (BP Location: Left Arm, Patient Position: Sitting, Cuff Size: Large)   Pulse (!) 52   Temp 97.8 F (36.6 C) (Oral)   SpO2 92%                 Constitutional: Pt appears in NAD               HENT: Head: NCAT.                Right Ear: External ear normal.                 Left Ear: External ear normal.                Eyes: . Pupils are equal, round, and reactive to light. Conjunctivae and EOM are normal               Nose: without d/c or deformity               Neck: Neck supple. Gross normal ROM               Cardiovascular: Normal rate and regular rhythm.                 Pulmonary/Chest: Effort normal and breath sounds without rales or wheezing.                Abd:  Soft, NT, ND, + BS, no organomegaly               Right shoulder mild subacromial tender, with abduction to 100 degress               Left shoulder NT but abduction to 80 degrees only               Neurological: Pt is alert. At baseline orientation, motor grossly intact               Skin: Skin is warm. No rashes, no other new lesions, LE edema - trace to 1+ bilateral               Psychiatric: Pt behavior is normal without agitation   Micro: none  Cardiac tracings I have personally interpreted today:  none  Pertinent Radiological findings (summarize): none   Lab Results  Component Value Date   WBC 7.2 08/20/2021   HGB 11.5 (L) 08/20/2021   HCT 35.2 (L) 08/20/2021   PLT 307.0 08/20/2021   GLUCOSE 112 (H) 08/20/2021   CHOL 188 08/20/2021   TRIG 95.0  08/20/2021   HDL 59.20 08/20/2021   LDLDIRECT 180.0 01/29/2010   LDLCALC 109 (H) 08/20/2021   ALT 10 08/20/2021   AST 17 08/20/2021   NA 141 08/20/2021   K 4.3 08/20/2021   CL 106 08/20/2021   CREATININE 0.90 08/20/2021   BUN 15 08/20/2021   CO2 29 08/20/2021   TSH 5.83 (H) 08/20/2021   INR 1.2 05/13/2020   HGBA1C 6.5 08/20/2021   MICROALBUR <0.7 08/20/2021   Assessment/Plan:  Deanna Wall is a 88 y.o. Black  or African American [2] female with  has a past medical history of ANEMIA, PERNICIOUS (01/30/2009), Cataracts, bilateral, DEGENERATIVE DISC DISEASE (10/28/2006), ECZEMA (10/28/2006), Gait disorder, Glaucoma, HYPERLIPIDEMIA (02/16/2010), HYPERTENSION (10/28/2006), Impaired glucose tolerance (12/31/2010), MS (multiple sclerosis) ([place]), Multiple sclerosis ([place]) (10/28/2006), Obesity, OSTEOARTHRITIS, KNEES, BILATERAL, SEVERE (06/14/2009), Sebaceous cyst, and VITAMIN B12 DEFICIENCY (06/14/2009).  Vitamin D deficiency Last vitamin D Lab Results  Component Value Date   VD25OH 57.63 08/20/2021   Stable, cont oral replacement   Impaired glucose tolerance Lab Results  Component Value Date   HGBA1C 6.5 08/20/2021   Stable, pt to continue current medical treatment  - diet, wt control, excercise   Hyperlipidemia Lab Results  Component Value Date   LDLCALC 109 (H) 08/20/2021   Uncontrolled, goal ldl < 70,  pt to continue current zetia 10 mg and lower chol diet, declines statin   Essential hypertension BP Readings from Last 3 Encounters:  02/18/22 138/74  08/20/21 130/70  02/17/21 132/70   Stable, pt to continue medical treatment norvasc 5 mg qd, clonidine 0.3 bid   Hypothyroidism Lab Results  Component Value Date   TSH 5.83 (H) 08/20/2021   Mild uncontrolled now on replacement, pt to continue levothyroxine 25 mcg qd, and f/u lab today   Bilateral shoulder pain Exam c/w likely right bursitis, and left rotater cuff tear, overall mild to pt and declines tx or referral to Sports Medicine for now; will also check ESR though less likely to be PMR  Followup: Return in about 6 months (around 08/19/2022).  Cathlean Cower, MD 02/18/2022 11:01 AM [facility]

## 2022-02-18 NOTE — Assessment & Plan Note (Signed)
Exam c/w likely right bursitis, and left rotater cuff tear, overall mild to pt and declines tx or referral to Sports Medicine for now; will also check ESR though less likely to be PMR

## 2022-02-18 NOTE — Assessment & Plan Note (Signed)
Lab Results  Component Value Date   TSH 5.83 (H) 08/20/2021   Mild uncontrolled now on replacement, pt to continue levothyroxine 25 mcg qd, and f/u lab today

## 2022-02-25 ENCOUNTER — Other Ambulatory Visit: Payer: Self-pay | Admitting: Internal Medicine

## 2022-02-25 NOTE — Telephone Encounter (Signed)
Please refill as per office routine med refill policy (all routine meds to be refilled for 3 mo or monthly (per pt preference) up to one year from last visit, then month to month grace period for 3 mo, then further med refills will have to be denied) ? ?

## 2022-03-12 DIAGNOSIS — H0102A Squamous blepharitis right eye, upper and lower eyelids: Secondary | ICD-10-CM | POA: Diagnosis not present

## 2022-03-12 DIAGNOSIS — H04123 Dry eye syndrome of bilateral lacrimal glands: Secondary | ICD-10-CM | POA: Diagnosis not present

## 2022-03-12 DIAGNOSIS — H401232 Low-tension glaucoma, bilateral, moderate stage: Secondary | ICD-10-CM | POA: Diagnosis not present

## 2022-03-12 DIAGNOSIS — H0102B Squamous blepharitis left eye, upper and lower eyelids: Secondary | ICD-10-CM | POA: Diagnosis not present

## 2022-05-26 ENCOUNTER — Other Ambulatory Visit: Payer: Self-pay | Admitting: Internal Medicine

## 2022-05-31 IMAGING — RF DG FEMUR 2+V*R*
1 series · 5 of 5 positions shown · non-contrast
Comparison: Radiographs of the right knee 05/12/2020.

CLINICAL DATA: Surgery, elective. Additional history provided:
Right femur surgery. Provided fluoroscopy time 1 minutes, 41
seconds.

EXAM:
RIGHT FEMUR 2 VIEWS; DG C-ARM 1-60 MIN

[Series 1: unknown protocol · 0.20mm/px · 5 of 5 slices shown]
[im 1/5]
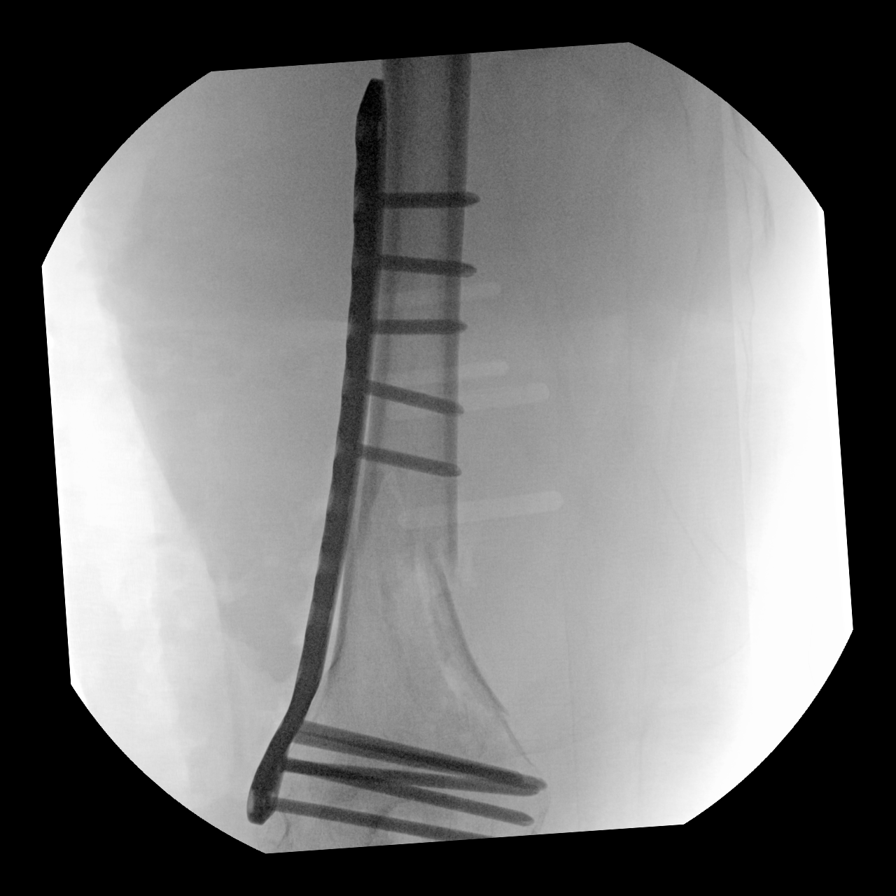
[im 2/5]
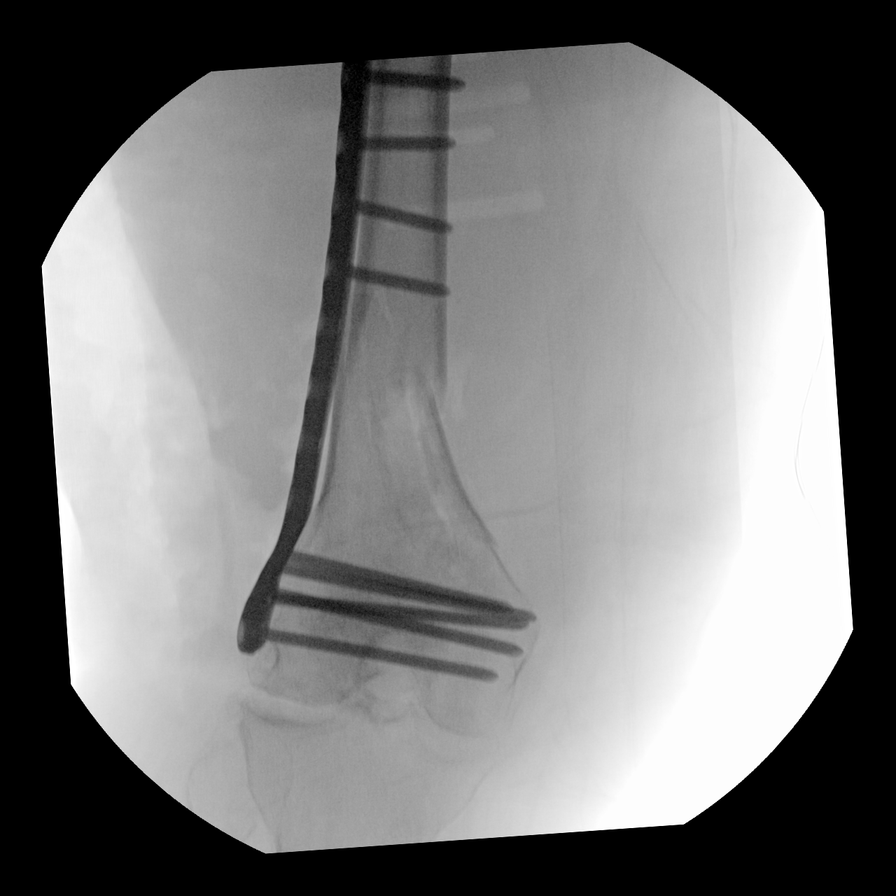
[im 3/5]
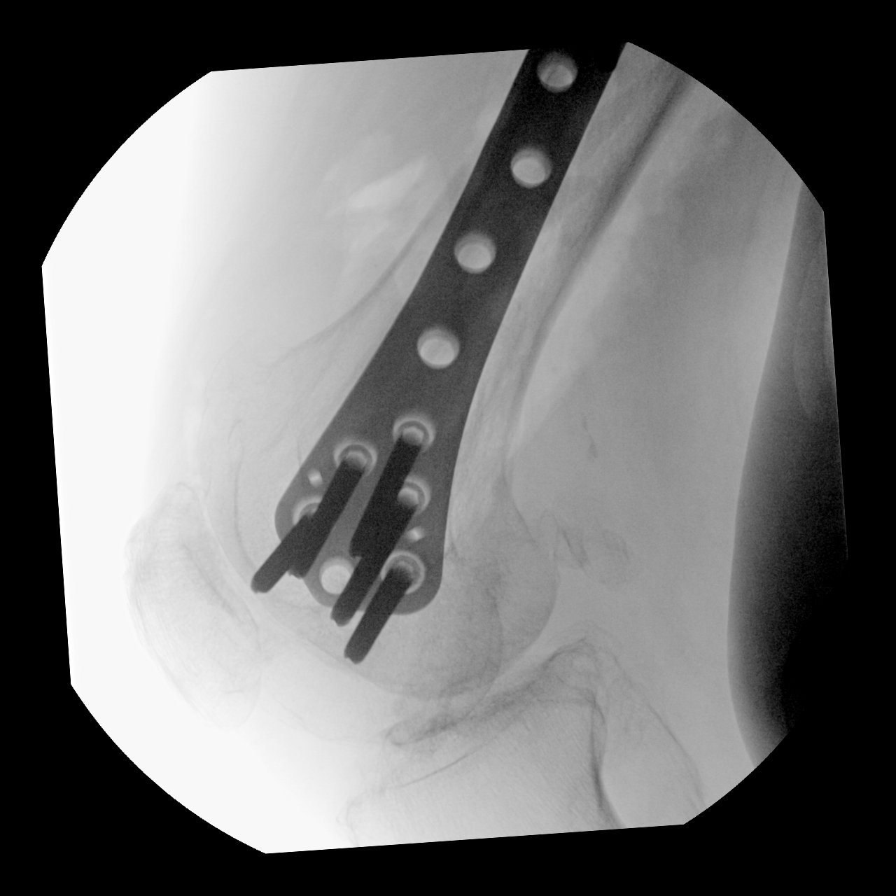
[im 4/5]
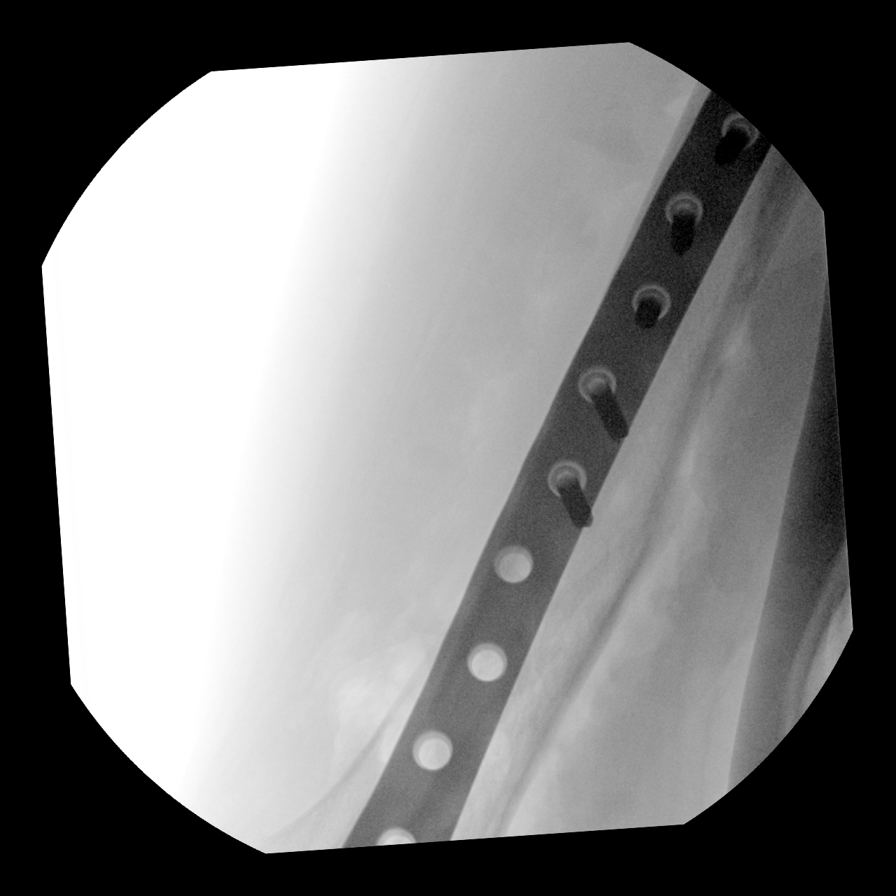
[im 5/5]
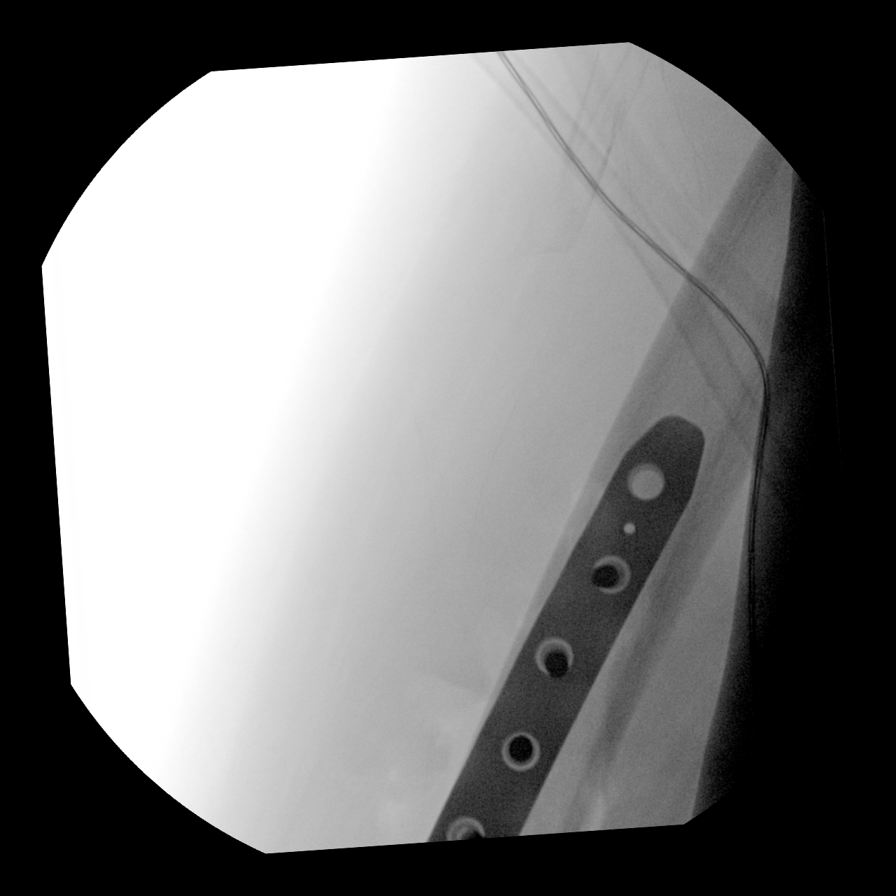

[5 of 5 positions shown; findings below may reference images not displayed]

FINDINGS: Five intraoperative fluoroscopic images of the distal right femur
are submitted. There has been interval ORIF of a distal right femur
fracture with lateral plate and screw fixation. There is some
persistent displacement of fracture fragments, but overall alignment
is near anatomic.
IMPRESSION: Five intraoperative fluoroscopic images from ORIF of a distal right
femoral fracture, as described.

## 2022-05-31 IMAGING — RF DG C-ARM 1-60 MIN
1 series · 5 of 5 positions shown · non-contrast
Comparison: Radiographs of the right knee 05/12/2020.

CLINICAL DATA: Surgery, elective. Additional history provided:
Right femur surgery. Provided fluoroscopy time 1 minutes, 41
seconds.

EXAM:
RIGHT FEMUR 2 VIEWS; DG C-ARM 1-60 MIN

[Series 1: unknown protocol · 0.20mm/px · 5 of 5 slices shown]
[im 1/5]
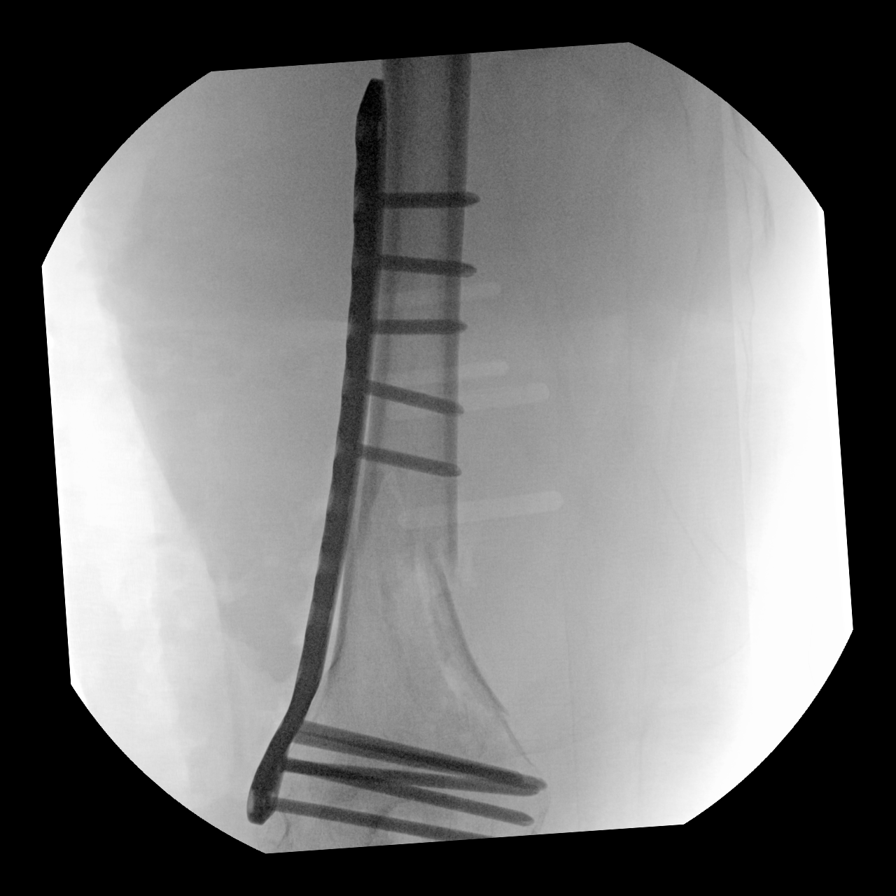
[im 2/5]
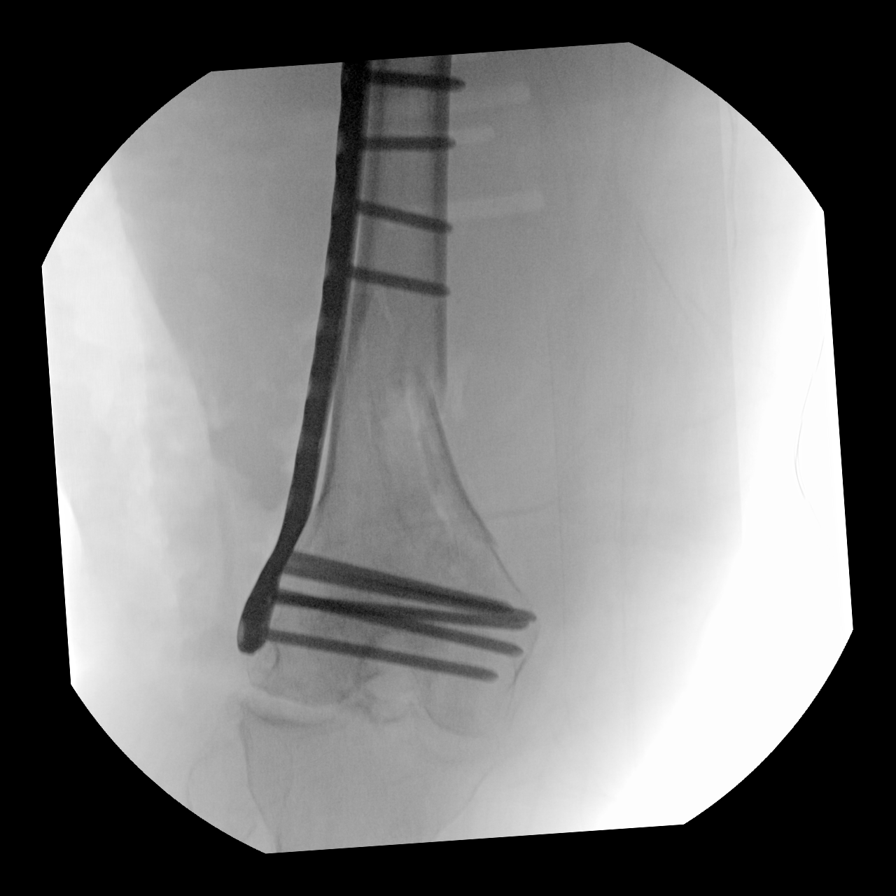
[im 3/5]
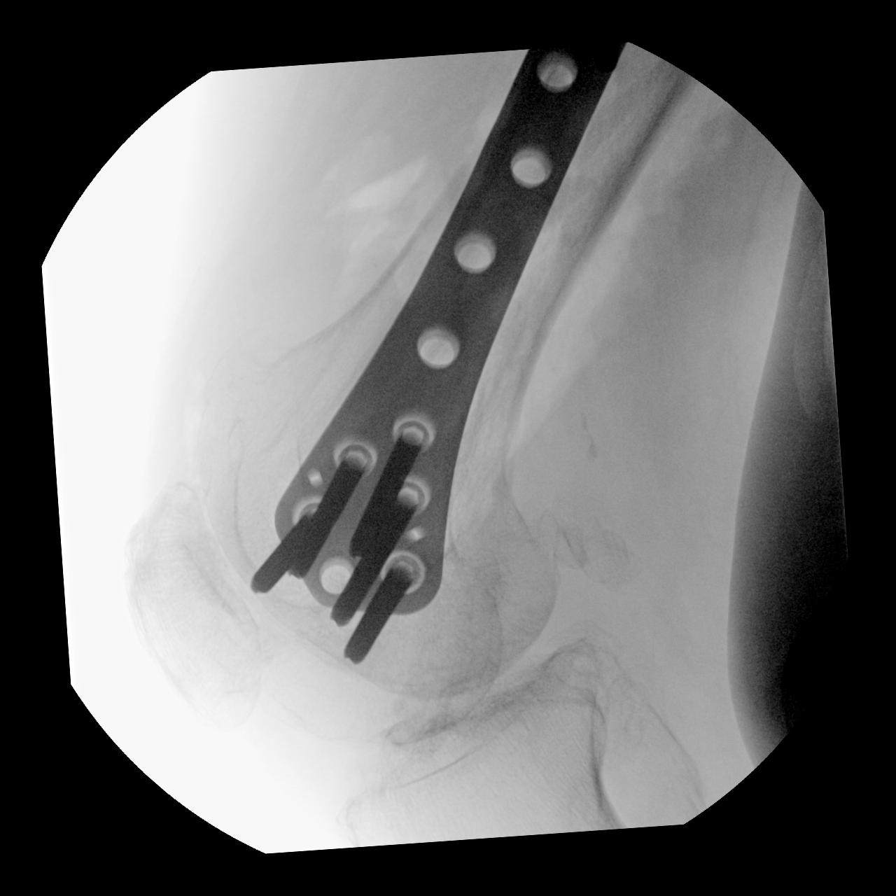
[im 4/5]
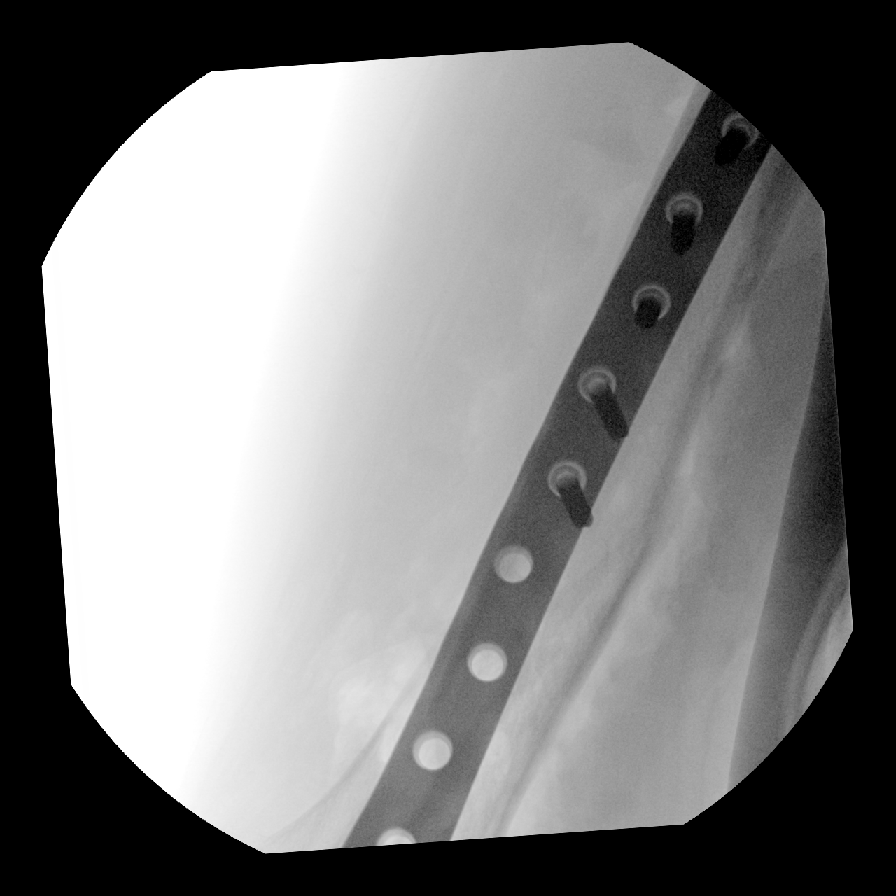
[im 5/5]
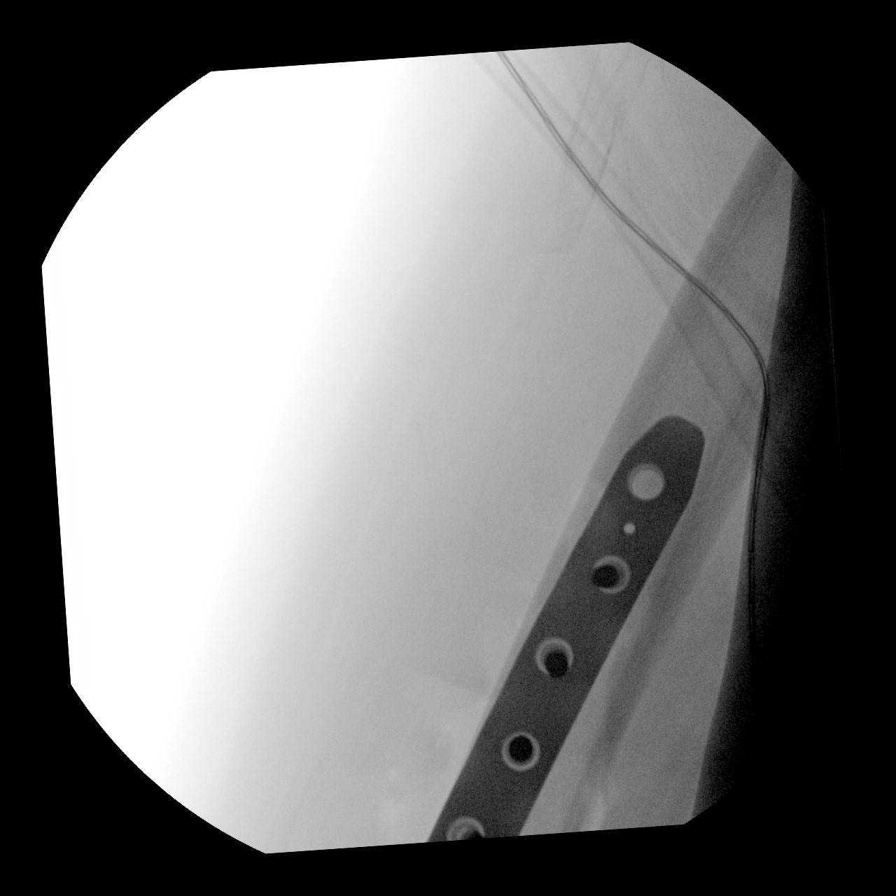

[5 of 5 positions shown; findings below may reference images not displayed]

FINDINGS: Five intraoperative fluoroscopic images of the distal right femur
are submitted. There has been interval ORIF of a distal right femur
fracture with lateral plate and screw fixation. There is some
persistent displacement of fracture fragments, but overall alignment
is near anatomic.
IMPRESSION: Five intraoperative fluoroscopic images from ORIF of a distal right
femoral fracture, as described.

## 2022-06-09 ENCOUNTER — Telehealth: Payer: Self-pay | Admitting: Internal Medicine

## 2022-06-09 NOTE — Telephone Encounter (Signed)
Patient dropped off document Handicap Placard, to be filled out by provider. Patient requested to send it via Call Patient to pick up within 7-days. Document is located in providers tray at front office.Please advise at [facility] [phone]

## 2022-06-16 NOTE — Telephone Encounter (Signed)
Form received and completion is in progress

## 2022-06-18 NOTE — Telephone Encounter (Signed)
Patient notified of form completion and placed at front office for pick up

## 2022-06-23 DIAGNOSIS — Z1231 Encounter for screening mammogram for malignant neoplasm of breast: Secondary | ICD-10-CM | POA: Diagnosis not present

## 2022-06-23 LAB — HM MAMMOGRAPHY

## 2022-06-29 ENCOUNTER — Other Ambulatory Visit: Payer: Self-pay | Admitting: Internal Medicine

## 2022-07-06 DIAGNOSIS — R92321 Mammographic fibroglandular density, right breast: Secondary | ICD-10-CM | POA: Diagnosis not present

## 2022-07-06 DIAGNOSIS — N6315 Unspecified lump in the right breast, overlapping quadrants: Secondary | ICD-10-CM | POA: Diagnosis not present

## 2022-07-08 ENCOUNTER — Other Ambulatory Visit: Payer: Self-pay

## 2022-07-08 DIAGNOSIS — C50811 Malignant neoplasm of overlapping sites of right female breast: Secondary | ICD-10-CM | POA: Diagnosis not present

## 2022-07-10 ENCOUNTER — Telehealth: Payer: Self-pay | Admitting: Hematology

## 2022-07-10 ENCOUNTER — Encounter: Payer: Self-pay | Admitting: Internal Medicine

## 2022-07-10 NOTE — Telephone Encounter (Signed)
Spoke to patient to confirm upcoming afternoon [facility] clinic on 4/24, paperwork will be sent via [employer].   Gave location and time, also informed patient that the surgeon's office would be calling as well to get information from them similar to the packet that they will be receiving so make sure to do both.  Reminded patient that all providers will be coming to the clinic to see them HERE and if they had any questions to not hesitate to reach back out to myself or their navigators.

## 2022-07-13 ENCOUNTER — Encounter: Payer: Self-pay | Admitting: *Deleted

## 2022-07-13 DIAGNOSIS — Z170 Estrogen receptor positive status [ER+]: Secondary | ICD-10-CM

## 2022-07-14 NOTE — Progress Notes (Signed)
Radiation Oncology         ([phone] ________________________________  Multidisciplinary Breast Oncology Clinic [facility]) Initial Outpatient Consultation  Name: Deanna Wall MRN: [id number]  Date: 07/15/2022  DOB: [birth year]  AO:[id number], Len Blalock, MD  Griselda Miner, MD   REFERRING PHYSICIAN: Chevis Pretty III, MD  DIAGNOSIS: There were no encounter diagnoses.  Stage *** Right Breast UOQ, *** Carcinoma, ER*** / PR*** / Her2***, Grade ***  No diagnosis found.  HISTORY OF PRESENT ILLNESS::Deanna Wall is a 89 y.o. female who is presenting to the office today for evaluation of her newly diagnosed breast cancer. She is accompanied by ***. She is doing well overall.   She had routine screening mammography on 06/23/22 showing a possible abnormality in the right breast. She underwent a right diagnostic mammography at [facility] on 07/06/22 showing: a suspicious 1.1 cm mass in the upper inner right breast at a middle depth. No other masses or abnormalities were appreciated in the right breast. Ultrasound of the right breast performed that same date at [facility] showed a 1.2 cm mass in the 12 o'clock right breast, 9 cmfn, highly suggestive of malignancy.   Biopsy on *** showed: ***. Prognostic indicators significant for: estrogen receptor, ***% {positive or negative} and progesterone receptor, ***% {positive or negative}, both with {include note beside percentage here/strong staining intensity}. Proliferation marker Ki67 at ***%. HER2 {positive or negative}.  Menarche: *** years old Age at first live birth: *** years old GP: *** LMP: *** Contraceptive: *** HRT: ***   The patient was referred today for presentation in the multidisciplinary conference.  Radiology studies and pathology slides were presented there for review and discussion of treatment options.  A consensus was discussed regarding potential next steps.  PREVIOUS RADIATION THERAPY: {EXAM; YES/NO:19492::"No"}  PAST MEDICAL HISTORY:  Past  Medical History:  Diagnosis Date   ANEMIA, PERNICIOUS 01/30/2009   Cataracts, bilateral    DEGENERATIVE DISC DISEASE 10/28/2006   ECZEMA 10/28/2006   Gait disorder    Glaucoma    HYPERLIPIDEMIA 02/16/2010   HYPERTENSION 10/28/2006   Impaired glucose tolerance 12/31/2010   MS (multiple sclerosis) ([facility])    Multiple sclerosis ([facility]) 10/28/2006   Obesity    OSTEOARTHRITIS, KNEES, BILATERAL, SEVERE 06/14/2009   Sebaceous cyst    chest    VITAMIN B12 DEFICIENCY 06/14/2009    PAST SURGICAL HISTORY: Past Surgical History:  Procedure Laterality Date   COLONOSCOPY     ORIF FEMUR FRACTURE Right 05/13/2020   Procedure: OPEN REDUCTION INTERNAL FIXATION (ORIF) DISTAL FEMUR FRACTURE;  Surgeon: Juanell Fairly, MD;  Location: [facility] ORS;  Service: Orthopedics;  Laterality: Right;   TUBAL LIGATION  1958   UPPER GASTROINTESTINAL ENDOSCOPY      FAMILY HISTORY:  Family History  Problem Relation Age of Onset   Bone cancer Mother    Arthritis Mother    Cancer Mother        bone   Diabetes Sister    Arthritis Sister     SOCIAL HISTORY:  Social History   Socioeconomic History   Marital status: Widowed    Spouse name: Not on file   Number of children: 5   Years of education: 11TH    Highest education level: Not on file  Occupational History   Occupation: homemaker    Employer: RETIRED  Tobacco Use   Smoking status: Never   Smokeless tobacco: Never  Substance and Sexual Activity   Alcohol use: No   Drug use: No   Sexual activity:  Not on file  Other Topics Concern   Not on file  Social History Narrative   Not on file   Social Determinants of Health   Financial Resource Strain: Low Risk  (10/10/2021)   Overall Financial Resource Strain (CARDIA)    Difficulty of Paying Living Expenses: Not hard at all  Food Insecurity: No Food Insecurity (10/10/2021)   Hunger Vital Sign    Worried About Running Out of Food in the Last Year: Never true    Ran Out of Food in the Last Year: Never true   Transportation Needs: No Transportation Needs (10/10/2021)   PRAPARE - Administrator, Civil Service (Medical): No    Lack of Transportation (Non-Medical): No  Physical Activity: Insufficiently Active (10/10/2021)   Exercise Vital Sign    Days of Exercise per Week: 3 days    Minutes of Exercise per Session: 30 min  Stress: No Stress Concern Present (10/10/2021)   [employer] of Occupational Health - Occupational Stress Questionnaire    Feeling of Stress : Not at all  Social Connections: Moderately Isolated (10/10/2021)   Social Connection and Isolation Panel [NHANES]    Frequency of Communication with Friends and Family: Three times a week    Frequency of Social Gatherings with Friends and Family: Three times a week    Attends Religious Services: More than 4 times per year    Active Member of Clubs or Organizations: No    Attends Banker Meetings: Never    Marital Status: Widowed    ALLERGIES:  Allergies  Allergen Reactions   Atorvastatin     REACTION: feels funny in face and head    MEDICATIONS:  Current Outpatient Medications  Medication Sig Dispense Refill   amLODipine (NORVASC) 5 MG tablet Take 1 tablet by mouth once daily 90 tablet 2   aspirin 81 MG tablet Take 81 mg by mouth daily.     cholecalciferol (VITAMIN D3) 25 MCG (1000 UNIT) tablet Take 2,000 Units by mouth daily.     citalopram (CELEXA) 10 MG tablet Take 1 tablet by mouth once daily 90 tablet 2   cloNIDine (CATAPRES) 0.3 MG tablet Take 1 tablet by mouth twice daily 180 tablet 3   diclofenac sodium (VOLTAREN) 1 % GEL Apply 2 g topically 4 (four) times daily as needed. 200 g 5   dorzolamide (TRUSOPT) 2 % ophthalmic solution 1 drop 2 (two) times daily.     ezetimibe (ZETIA) 10 MG tablet Take 1 tablet by mouth once daily 90 tablet 2   furosemide (LASIX) 20 MG tablet TAKE 1 TABLET BY MOUTH ONCE DAILY AS NEEDED FOR  SWELLING 90 tablet 2   latanoprost (XALATAN) 0.005 % ophthalmic solution  INSTILL 1 DROP IN BOTH EYES AT BEDTIME. KEEP THE BOTTLE IN THE FRIDGE UNTIL YOU OPEN IT.  3   levothyroxine (SYNTHROID) 25 MCG tablet Take 1 tablet (25 mcg total) by mouth daily. 90 tablet 3   nitrofurantoin (MACRODANTIN) 50 MG capsule Take 1 capsule by mouth once daily 30 capsule 5   polyethylene glycol powder (GLYCOLAX/MIRALAX) 17 GM/SCOOP powder Take 17 g by mouth 2 (two) times daily as needed. 3350 g 1   potassium chloride (KLOR-CON) 10 MEQ tablet Take 1 tablet (10 mEq total) by mouth daily. Take 1 tablet by mouth once daily Annual appt due in May pt must see provider for future refills 90 tablet 0   vitamin B-12 (CYANOCOBALAMIN) 500 MCG tablet Take 500 mcg by mouth daily.  No current facility-administered medications for this encounter.    REVIEW OF SYSTEMS: A 10+ POINT REVIEW OF SYSTEMS WAS OBTAINED including neurology, dermatology, psychiatry, cardiac, respiratory, lymph, extremities, GI, GU, musculoskeletal, constitutional, reproductive, HEENT. On the provided form, she reports ***. She denies *** and any other symptoms.    PHYSICAL EXAM:  vitals were not taken for this visit.  {may need to copy over vitals} Lungs are clear to auscultation bilaterally. Heart has regular rate and rhythm. No palpable cervical, supraclavicular, or axillary adenopathy. Abdomen soft, non-tender, normal bowel sounds. Breast: *** breast with no palpable mass, nipple discharge, or bleeding. *** breast with ***.   KPS = ***  100 - Normal; no complaints; no evidence of disease. 90   - Able to carry on normal activity; minor signs or symptoms of disease. 80   - Normal activity with effort; some signs or symptoms of disease. 23   - Cares for self; unable to carry on normal activity or to do active work. 60   - Requires occasional assistance, but is able to care for most of his personal needs. 50   - Requires considerable assistance and frequent medical care. 40   - Disabled; requires special care and  assistance. 30   - Severely disabled; hospital admission is indicated although death not imminent. 20   - Very sick; hospital admission necessary; active supportive treatment necessary. 10   - Moribund; fatal processes progressing rapidly. 0     - Dead  Karnofsky DA, Abelmann WH, Craver LS and Burchenal [facility] [phone]) The use of the nitrogen mustards in the palliative treatment of carcinoma: with particular reference to bronchogenic carcinoma Cancer 1 634-56  LABORATORY DATA:  Lab Results  Component Value Date   WBC 7.2 08/20/2021   HGB 11.5 (L) 08/20/2021   HCT 35.2 (L) 08/20/2021   MCV 87.1 08/20/2021   PLT 307.0 08/20/2021   Lab Results  Component Value Date   NA 141 08/20/2021   K 4.3 08/20/2021   CL 106 08/20/2021   CO2 29 08/20/2021   Lab Results  Component Value Date   ALT 12 02/18/2022   AST 17 02/18/2022   ALKPHOS 82 02/18/2022   BILITOT 0.6 02/18/2022    PULMONARY FUNCTION TEST:   Review Flowsheet        No data to display          RADIOGRAPHY: No results found.    IMPRESSION: *** {DIAGNOSIS HERE}  Patient will be a good candidate for breast conservation with radiotherapy to the right breast. We discussed the general course of radiation, potential side effects, and toxicities with radiation and the patient is interested in this approach. ***   PLAN:  ***   ------------------------------------------------  Billie Lade, PhD, MD  This document serves as a record of services personally performed by Antony Blackbird, MD. It was created on his behalf by Neena Rhymes, a trained medical scribe. The creation of this record is based on the scribe's personal observations and the provider's statements to them. This document has been checked and approved by the attending provider.

## 2022-07-15 ENCOUNTER — Inpatient Hospital Stay: Payer: Medicare Other

## 2022-07-15 ENCOUNTER — Ambulatory Visit: Payer: Self-pay | Admitting: General Surgery

## 2022-07-15 ENCOUNTER — Inpatient Hospital Stay: Payer: Medicare Other | Attending: Hematology | Admitting: Hematology

## 2022-07-15 ENCOUNTER — Ambulatory Visit: Payer: Medicare Other | Admitting: Physical Therapy

## 2022-07-15 ENCOUNTER — Ambulatory Visit
Admission: RE | Admit: 2022-07-15 | Discharge: 2022-07-15 | Disposition: A | Discharge status: Home / Self Care | Payer: Medicare Other | Source: Ambulatory Visit | Attending: Radiation Oncology | Admitting: Radiation Oncology

## 2022-07-15 ENCOUNTER — Encounter: Payer: Self-pay | Admitting: Hematology

## 2022-07-15 ENCOUNTER — Telehealth: Payer: Self-pay | Admitting: Genetic Counselor

## 2022-07-15 ENCOUNTER — Other Ambulatory Visit: Payer: Self-pay

## 2022-07-15 VITALS — BP 177/55 | HR 53 | Temp 97.5°F | Resp 18

## 2022-07-15 DIAGNOSIS — Z170 Estrogen receptor positive status [ER+]: Secondary | ICD-10-CM

## 2022-07-15 DIAGNOSIS — Z800 Family history of malignant neoplasm of digestive organs: Secondary | ICD-10-CM | POA: Diagnosis not present

## 2022-07-15 DIAGNOSIS — G35 Multiple sclerosis: Secondary | ICD-10-CM | POA: Diagnosis not present

## 2022-07-15 DIAGNOSIS — Z808 Family history of malignant neoplasm of other organs or systems: Secondary | ICD-10-CM | POA: Diagnosis not present

## 2022-07-15 DIAGNOSIS — C50211 Malignant neoplasm of upper-inner quadrant of right female breast: Secondary | ICD-10-CM | POA: Diagnosis not present

## 2022-07-15 DIAGNOSIS — C50411 Malignant neoplasm of upper-outer quadrant of right female breast: Secondary | ICD-10-CM | POA: Diagnosis not present

## 2022-07-15 LAB — CBC WITH DIFFERENTIAL (CANCER CENTER ONLY)
Abs Immature Granulocytes: 0.02 10*3/uL (ref 0.00–0.07)
Basophils Absolute: 0.1 10*3/uL (ref 0.0–0.1)
Basophils Relative: 1 %
Eosinophils Absolute: 0.1 10*3/uL (ref 0.0–0.5)
Eosinophils Relative: 1 %
HCT: 34.3 % — ABNORMAL LOW (ref 36.0–46.0)
Hemoglobin: 11.5 g/dL — ABNORMAL LOW (ref 12.0–15.0)
Immature Granulocytes: 0 %
Lymphocytes Relative: 33 %
Lymphs Abs: 2.3 10*3/uL (ref 0.7–4.0)
MCH: 28.3 pg (ref 26.0–34.0)
MCHC: 33.5 g/dL (ref 30.0–36.0)
MCV: 84.5 fL (ref 80.0–100.0)
Monocytes Absolute: 0.5 10*3/uL (ref 0.1–1.0)
Monocytes Relative: 7 %
Neutro Abs: 4.1 10*3/uL (ref 1.7–7.7)
Neutrophils Relative %: 58 %
Platelet Count: 318 10*3/uL (ref 150–400)
RBC: 4.06 MIL/uL (ref 3.87–5.11)
RDW: 14.5 % (ref 11.5–15.5)
WBC Count: 7 10*3/uL (ref 4.0–10.5)
nRBC: 0 % (ref 0.0–0.2)

## 2022-07-15 LAB — CMP (CANCER CENTER ONLY)
ALT: 13 U/L (ref 0–44)
AST: 16 U/L (ref 15–41)
Albumin: 3.8 g/dL (ref 3.5–5.0)
Alkaline Phosphatase: 81 U/L (ref 38–126)
Anion gap: 5 (ref 5–15)
BUN: 15 mg/dL (ref 8–23)
CO2: 28 mmol/L (ref 22–32)
Calcium: 9.2 mg/dL (ref 8.9–10.3)
Chloride: 107 mmol/L (ref 98–111)
Creatinine: 0.85 mg/dL (ref 0.44–1.00)
GFR, Estimated: >60 mL/min (ref >60)
Glucose, Bld: 110 mg/dL — ABNORMAL HIGH (ref 70–99)
Potassium: 3.8 mmol/L (ref 3.5–5.1)
Sodium: 140 mmol/L (ref 135–145)
Total Bilirubin: 0.7 mg/dL (ref 0.3–1.2)
Total Protein: 7.4 g/dL (ref 6.5–8.1)

## 2022-07-15 LAB — GENETIC SCREENING ORDER

## 2022-07-15 MED ORDER — KETOROLAC TROMETHAMINE 15 MG/ML IJ SOLN
15.0000 mg | Freq: Once | INTRAMUSCULAR | Status: AC
Start: 2022-07-15 — End: 2022-07-20

## 2022-07-15 NOTE — Telephone Encounter (Signed)
Deanna Wall was seen by a genetic counselor during the breast multidisciplinary clinic on 07/15/2022. In addition to her personal history of breast cancer, she reported a family history of a mother diagnosed with bone cancer, maternal aunt diagnosed with stomach cancer, and maternal first cousin diagnosed with prostate cancer. She does not meet NCCN criteria for genetic testing at this time. She was still offered genetic counseling and testing but declined. We encourage her to contact us if there are any changes to her personal or family history of cancer. If she meets NCCN criteria based on the updated personal/family history, she would be recommended to have genetic counseling and testing.   Lalla Brothers, MS, [facility] Genetic Counselor [email or web address] (P) [phone]

## 2022-07-15 NOTE — Research (Signed)
[employer] 2021-05 - Specimen Collection Study to Evaluate Biomarkers in Subjects with Cancer   Patient Deanna Wall was identified by Dr Mosetta Putt as a potential candidate for the above listed study. This Clinical Research Nurse met with [name], [id number], on 07/15/22 in a manner and location that ensures patient privacy to discuss participation in the above listed research study. Patient is Accompanied by her three daughters . A copy of the informed consent document with embedded HIPAA language was provided to the patient.   Patient reads, speaks, and understands [place]. Patient was provided with the business card of this Nurse and encouraged to contact the research team with any questions. Approximately 15 minutes were spent with the patient reviewing the informed consent documents. Patient was provided the option of taking informed consent documents home to review and was encouraged to review at their convenience with their support network, including other care providers. Patient took the consent documents home to review.  A member of the research team will follow-up with the patient next week.  Juanita Laster, RN, BSN, CPN Clinical Research Nurse I [phone]  07/15/2022 3:18 PM

## 2022-07-15 NOTE — Progress Notes (Signed)
[facility]   [phone] [phone]   Clinic New Consult Note   Patient Care Team: Deanna Levins, MD as PCP - General [facility] as Consulting Physician (Ophthalmology) Deanna Angelica, RN as Oncology Nurse Navigator Deanna Proud, RN as Oncology Nurse Navigator Deanna Mood, MD as Consulting Physician (Hematology) Deanna Blackbird, MD as Consulting Physician (Radiation Oncology) Deanna Miner, MD as Consulting Physician (General Surgery)  Date of Service:  07/15/2022   CHIEF COMPLAINTS/PURPOSE OF CONSULTATION:  Right  Breast Cancer, ER positive  REFERRING PHYSICIAN:  SOLIS   ASSESSMENT & PLAN:  Deanna Wall is a 89 y.o. post-menopausal female with a history of   1.Malignant neoplasm of upper -outer quadrant of right breast in female, cT1cN0M0 stage IA, ER+/PR+/HER2- -I discussed her breast imaging and needle biopsy results with patient and her family members in great detail. -She is a candidate for breast conservation surgery. She has been seen by breast surgeon Deanna Wall, who recommends lumpectomy. Due to her advanced age and small tumor, negative ultrasound of axillary, sentinel lymph Dianely Krehbiel biopsy would not be performed. -We discussed her risk of recurrence after completed surgical resection, and the benefit of adjuvant radiation and adjuvant antiestrogen therapy. -Due to her advanced age, immobility from MS, she is not a candidate for adjuvant radiation. --Given the strong ER and PR positivity, I discussed adjuvant aromatase inhibitor to reduce her risk of cancer recurrence,  The potential benefit and side effects, which includes but not limited to, hot flash, skin and vaginal dryness, metabolic changes ( increased blood glucose, cholesterol, weight, etc.), slightly in increased risk of cardiovascular disease, cataracts, muscular and joint discomfort, osteopenia and osteoporosis, etc, were discussed with her in great details.   Patient and her daughters voiced good understanding. -Given her advanced age, and her medical comorbidities, if she has side effects from antiestrogen therapy, I have a low threshold to stop it.  If she decided not to pursue, it is also reasonable. -We also discussed palliative role (for disease control, not curative) of antiestrogen therapy if she decided not to pursue surgery --We discussed breast cancer surveillance after she completes treatment, Including annual mammogram, breast exam every 6-12 months. Marland Kitchen  PLAN:  -discuss surgery and antiestrogen therapy, she will meet Deanna Wall to discuss surgery  -will see pt after surgery or sooner if surgery is not planned   Oncology History Overview Note   Cancer Staging  Malignant neoplasm of upper-outer quadrant of right breast in female, estrogen receptor positive Staging form: Breast, AJCC 8th Edition - Clinical stage from 07/08/2022: Stage IA (cT1c, cN0, cM0, G2, ER+, PR+, HER2-) - Signed by Deanna Mood, MD on 07/14/2022 Stage prefix: Initial diagnosis Histologic grading system: 3 grade system     Malignant neoplasm of upper-outer quadrant of right breast in female, estrogen receptor positive  07/08/2022 Cancer Staging   Staging form: Breast, AJCC 8th Edition - Clinical stage from 07/08/2022: Stage IA (cT1c, cN0, cM0, G2, ER+, PR+, HER2-) - Signed by Deanna Mood, MD on 07/14/2022 Stage prefix: Initial diagnosis Histologic grading system: 3 grade system   07/13/2022 Initial Diagnosis   Malignant neoplasm of upper-outer quadrant of right breast in female, estrogen receptor positive      HISTORY OF PRESENTING ILLNESS:  Deanna Wall 89 y.o. female is a here because of breast cancer. The patient was referred by the breast center. The patient presents to the clinic today accompanied by daughters.   She had  routine screening mammography on showing a possible abnormality in the right breast.She underwent bilateral diagnostic mammography and  breast  ultrasonography on 07/06/2022 showing: 1.2 cm x 0.7 cm x1 cm irregular mass in the right breast at 12 o'clock middle depth 9 cm from the nipple.. Pt state that she had some pain In the right breast  Biopsy on 07/09/2022 showed: Marland Kitchen Prognostic indicators significant for: estrogen receptor, 95%positive and progesterone receptor, 95% positive. Proliferation marker Ki67 at 10%. HER2 negative.    Today the patient notes they felt/feeling prior/after.Marland KitchenMarland KitchenPt state that she had some pain In the right breast   She has a PMHx of.... MS(diagnose 89) Hypertension Mother- Bone cancer  Socially... Widow 5 children   GYN HISTORY  Menarchal: xx LMP: xx Contraceptive: HRT: No GP:G5,P5    REVIEW OF SYSTEMS:    Constitutional:(-)  Denies fevers, chills or abnormal night sweats Eyes:(-) Denies blurriness of vision, double vision or watery eyes Ears, nose, mouth, throat, and face: Denies mucositis or sore throat Respiratory: (-) Denies cough, dyspnea or wheezes Cardiovascular: (-) Denies palpitation, chest discomfort or lower extremity swelling Gastrointestinal:(-)   Denies nausea, heartburn or change in bowel habits Skin(-): Denies abnormal skin rashes Lymphatics: Denies new lymphadenopathy or easy bruising Neurological:(-) Denies numbness, tingling or new weaknesses Behavioral/Psych: Wall is stable, no new changes  BREAST;(+) pain in the right breast   All other systems were reviewed with the patient and are negative.   MEDICAL HISTORY:  Past Medical History:  Diagnosis Date   ANEMIA, PERNICIOUS 01/30/2009   Breast cancer    Cataracts, bilateral    DEGENERATIVE DISC DISEASE 10/28/2006   ECZEMA 10/28/2006   Gait disorder    Glaucoma    HYPERLIPIDEMIA 02/16/2010   HYPERTENSION 10/28/2006   Impaired glucose tolerance 12/31/2010   MS (multiple sclerosis)    Multiple sclerosis 10/28/2006   Obesity    OSTEOARTHRITIS, KNEES, BILATERAL, SEVERE 06/14/2009   Sebaceous cyst    chest     VITAMIN B12 DEFICIENCY 06/14/2009    SURGICAL HISTORY: Past Surgical History:  Procedure Laterality Date   COLONOSCOPY     ORIF FEMUR FRACTURE Right 05/13/2020   Procedure: OPEN REDUCTION INTERNAL FIXATION (ORIF) DISTAL FEMUR FRACTURE;  Surgeon: Juanell Fairly, MD;  Location: [facility] ORS;  Service: Orthopedics;  Laterality: Right;   TUBAL LIGATION  1958   UPPER GASTROINTESTINAL ENDOSCOPY      SOCIAL HISTORY: Social History   Socioeconomic History   Marital status: Widowed    Spouse name: Not on file   Number of children: 5   Years of education: 11TH    Highest education level: Not on file  Occupational History   Occupation: homemaker    Employer: RETIRED  Tobacco Use   Smoking status: Never   Smokeless tobacco: Never  Substance and Sexual Activity   Alcohol use: No   Drug use: No   Sexual activity: Not on file  Other Topics Concern   Not on file  Social History Narrative   Not on file   Social Determinants of Health   Financial Resource Strain: Low Risk  (10/10/2021)   Overall Financial Resource Strain (CARDIA)    Difficulty of Paying Living Expenses: Not hard at all  Food Insecurity: No Food Insecurity (10/10/2021)   Hunger Vital Sign    Worried About Running Out of Food in the Last Year: Never true    Ran Out of Food in the Last Year: Never true  Transportation Needs: No Transportation Needs (10/10/2021)  PRAPARE - Transportation    Lack of Transportation (Medical): No    Lack of Transportation (Non-Medical): No  Physical Activity: Insufficiently Active (10/10/2021)   Exercise Vital Sign    Days of Exercise per Week: 3 days    Minutes of Exercise per Session: 30 min  Stress: No Stress Concern Present (10/10/2021)   [employer] of Occupational Health - Occupational Stress Questionnaire    Feeling of Stress : Not at all  Social Connections: Moderately Isolated (10/10/2021)   Social Connection and Isolation Panel [NHANES]    Frequency of Communication with  Friends and Family: Three times a week    Frequency of Social Gatherings with Friends and Family: Three times a week    Attends Religious Services: More than 4 times per year    Active Member of Clubs or Organizations: No    Attends Banker Meetings: Never    Marital Status: Widowed  Intimate Partner Violence: Not At Risk (10/10/2021)   Humiliation, Afraid, Rape, and Kick questionnaire    Fear of Current or Ex-Partner: No    Emotionally Abused: No    Physically Abused: No    Sexually Abused: No    FAMILY HISTORY: Family History  Problem Relation Age of Onset   Bone cancer Mother    Arthritis Mother    Diabetes Sister    Arthritis Sister    Stomach cancer Maternal Aunt    Prostate cancer Cousin        maternal first cousin    ALLERGIES:  is allergic to atorvastatin.  MEDICATIONS:  Current Outpatient Medications  Medication Sig Dispense Refill   amLODipine (NORVASC) 5 MG tablet Take 1 tablet by mouth once daily 90 tablet 2   aspirin 81 MG tablet Take 81 mg by mouth daily.     cholecalciferol (VITAMIN D3) 25 MCG (1000 UNIT) tablet Take 2,000 Units by mouth daily.     citalopram (CELEXA) 10 MG tablet Take 1 tablet by mouth once daily 90 tablet 2   cloNIDine (CATAPRES) 0.3 MG tablet Take 1 tablet by mouth twice daily 180 tablet 3   diclofenac sodium (VOLTAREN) 1 % GEL Apply 2 g topically 4 (four) times daily as needed. 200 g 5   dorzolamide (TRUSOPT) 2 % ophthalmic solution 1 drop 2 (two) times daily.     ezetimibe (ZETIA) 10 MG tablet Take 1 tablet by mouth once daily 90 tablet 2   furosemide (LASIX) 20 MG tablet TAKE 1 TABLET BY MOUTH ONCE DAILY AS NEEDED FOR  SWELLING 90 tablet 2   latanoprost (XALATAN) 0.005 % ophthalmic solution INSTILL 1 DROP IN BOTH EYES AT BEDTIME. KEEP THE BOTTLE IN THE FRIDGE UNTIL YOU OPEN IT.  3   levothyroxine (SYNTHROID) 25 MCG tablet Take 1 tablet (25 mcg total) by mouth daily. 90 tablet 3   nitrofurantoin (MACRODANTIN) 50 MG capsule  Take 1 capsule by mouth once daily 30 capsule 5   polyethylene glycol powder (GLYCOLAX/MIRALAX) 17 GM/SCOOP powder Take 17 g by mouth 2 (two) times daily as needed. 3350 g 1   potassium chloride (KLOR-CON) 10 MEQ tablet Take 1 tablet (10 mEq total) by mouth daily. Take 1 tablet by mouth once daily Annual appt due in May pt must see provider for future refills 90 tablet 0   vitamin B-12 (CYANOCOBALAMIN) 500 MCG tablet Take 500 mcg by mouth daily.     No current facility-administered medications for this visit.   Facility-Administered Medications Ordered in Other Visits  MedicAdministrator, Civil ServiceDose  Route Frequency Provider Last Rate Last Admin   ketorolac (TORADOL) 15 MG/ML injection 15 mg  15 mg Intravenous Once Chevis Pretty III, MD        PHYSICAL EXAMINATION: ECOG PERFORMANCE STATUS: 3 - Symptomatic, >50% confined to bed  Vitals:   07/15/22 1245  BP: (!) 177/55  Pulse: (!) 53  Resp: 18  Temp: (!) 97.5 F (36.4 C)  SpO2: 97%   Filed Weights    .GENERAL:alert, no distress and comfortable SKIN: skin color normal, no rashes or significant lesions EYES: normal, Conjunctiva are pink and non-injected, sclera clear  NEURO: alert & oriented x 3 with fluent speech NECK:(-)  supple, thyroid normal size, non-tender, without nodularity LYMPH:(-)   no palpable lymphadenopathy in the cervical, axillary  BREAST: Lt breast  No palpable mass, RT breast no palpable mass nodules or adenopathy bilaterally. Breast exam benign.   LABORATORY DATA:  I have reviewed the data as listed    Latest Ref Rng & Units 07/15/2022   12:24 PM 08/20/2021   11:46 AM 08/12/2020   11:57 AM  CBC  WBC 4.0 - 10.5 K/uL 7.0  7.2  6.3   Hemoglobin 12.0 - 15.0 g/dL 78.2  95.6  21.3   Hematocrit 36.0 - 46.0 % 34.3  35.2  33.1   Platelets 150 - 400 K/uL 318  307.0  309.0        Latest Ref Rng & Units 07/15/2022   12:24 PM 02/18/2022   11:09 AM 08/20/2021   11:46 AM  CMP  Glucose 70 - 99 mg/dL 086   578   BUN 8 - 23 mg/dL 15    15   Creatinine 4.69 - 1.00 mg/dL 6.29   5.28   Sodium 413 - 145 mmol/L 140   141   Potassium 3.5 - 5.1 mmol/L 3.8   4.3   Chloride 98 - 111 mmol/L 107   106   CO2 22 - 32 mmol/L 28   29   Calcium 8.9 - 10.3 mg/dL 9.2   9.5   Total Protein 6.5 - 8.1 g/dL 7.4  7.3  7.4   Total Bilirubin 0.3 - 1.2 mg/dL 0.7  0.6  0.7   Alkaline Phos 38 - 126 U/L 81  82  83   AST 15 - 41 U/L ALT 0 - 44 U/L RADIOGRAPHIC STUDIES: I have personally reviewed the radiological images as listed and agreed with the findings in the report. No results found.   No orders of the defined types were placed in this encounter.   All questions were answered. The patient knows to call the clinic with any problems, questions or concerns. The total time spent in the appointment was 45 minutes.     Deanna Mood, MD 07/15/2022 4:09 PM  I, Monica Martinez, am acting as scribe for Deanna Mood, MD.   I have reviewed the above documentation for accuracy and completeness, and I agree with the above.

## 2022-07-16 ENCOUNTER — Other Ambulatory Visit: Payer: Self-pay

## 2022-07-20 ENCOUNTER — Telehealth: Payer: Self-pay

## 2022-07-20 NOTE — Telephone Encounter (Signed)
[employer] 2021-05 - Specimen Collection Study to Evaluate Biomarkers in Subjects with Cancer   Called patient to follow up on research interest. She declined to pursue study enrollment at this time. Encouraged to call our team if we can be of assistance and thanked her for her time.  Margret Chance Tylyn Stankovich, RN, BSN, [facility] She  Her  Hers Clinical Research Nurse [facility] Direct Dial [phone]  Pager [phone] 07/20/2022 12:03 PM

## 2022-07-21 ENCOUNTER — Telehealth: Payer: Self-pay | Admitting: *Deleted

## 2022-07-21 ENCOUNTER — Encounter: Payer: Self-pay | Admitting: *Deleted

## 2022-07-21 NOTE — Telephone Encounter (Signed)
Spoke to pt concerning [facility] from 4.24.24. Denies questions or concerns regarding dx or treatment care plan. Encourage pt to call with needs. Received verbal understanding.

## 2022-07-23 ENCOUNTER — Other Ambulatory Visit: Payer: Self-pay

## 2022-07-23 NOTE — Patient Outreach (Signed)
Aging Gracefully Program  07/23/2022  [name] 1931/05/08 [id number]   [facility] Evaluation Interviewer made contact with patient. Aging Gracefully 9 month survey completed.    Vanice Sarah Care Management Assistant [phone]

## 2022-07-24 ENCOUNTER — Encounter: Payer: Self-pay | Admitting: General Practice

## 2022-07-24 NOTE — Progress Notes (Signed)
[facility] Psychosocial Distress Screening Spiritual Care  Attempted to phone Chanese following Breast Multidisciplinary Clinic to introduce Support Center team/resources, reviewing distress screen per protocol, but no answer.  The patient scored a 6 on the Psychosocial Distress Thermometer which indicates moderate distress.      07/24/2022   10:17 AM  ONCBCN DISTRESS SCREENING  Screening Type Initial Screening  Distress experienced in past week (1-10) 6  Emotional problem type Nervousness/Anxiety  Physical Problem type Pain;Getting around;Bathing/dressing;Constipation/diarrhea;Changes in urination;Tingling hands/feet;Swollen arms/legs  Referral to support programs Yes    Follow up needed: Yes.  Plan to phone again next week.   [address] [place], [place], [facility] Pager [phone] Voicemail [phone]

## 2022-07-27 ENCOUNTER — Encounter: Payer: Self-pay | Admitting: General Practice

## 2022-07-27 NOTE — Progress Notes (Signed)
[facility] Spiritual Care Note  Made [facility] follow-up call. Ms Glascoe was pleased to be remembered and requests to be lifted in prayer as she prepares for her procedure. She reports no other needs or concerns at this time, but is aware of ongoing Spiritual Care availability if needs arise or circumstances change.   [address] [place], [place], [facility] Pager [phone] Voicemail [phone]

## 2022-07-28 NOTE — Pre-Procedure Instructions (Signed)
Surgical Instructions    Your procedure is scheduled on Wednesday, Aug 05, 2022.     Report to [facility] Main Entrance "A" at 0630 A.M., then check in with the Admitting office.  Call this number if you have problems the morning of surgery:  [phone]  If you have any questions prior to your surgery date call [phone]: Open Monday-Friday 8am-4pm If you experience any cold or flu symptoms such as cough, fever, chills, shortness of breath, etc. between now and your scheduled surgery, please notify us at the above number.     Remember:  Do not eat after midnight the night before your surgery  You may drink clear liquids until 0530 AM the morning of your surgery.   Clear liquids allowed are: Water, Non-Citrus Juices (without pulp), Carbonated Beverages, Clear Tea, Black Coffee Only (NO MILK, CREAM OR POWDERED CREAMER of any kind), and Gatorade.    Take these medicines the morning of surgery with A SIP OF WATER :              amLODipine (NORVASC)              citalopram (CELEXA)              cloNIDine (CATAPRES)              dorzolamide (TRUSOPT) eye drops             ezetimibe (ZETIA)              latanoprost (XALATAN) eye drops             levothyroxine (SYNTHROID)              nitrofurantoin (MACRODANTIN)   Follow your surgeon's instructions on when to stop Aspirin.  If no instructions were given by your surgeon then you will need to call the office to get those instructions.     As of today, STOP taking any Aleve, Naproxen, Ibuprofen, Motrin, Advil, Goody's, BC's, all herbal medications, fish oil, and all vitamins.                     Do NOT Smoke (Tobacco/Vaping) for 24 hours prior to your procedure.  If you use a CPAP at night, you may bring your mask/headgear for your overnight stay.   Contacts, glasses, piercing's, hearing aid's, dentures or partials may not be worn into surgery, please bring cases for these belongings.    For patients admitted to the hospital,  discharge time will be determined by your treatment team.   Patients discharged the day of surgery will not be allowed to drive home, and someone needs to stay with them for 24 hours.  SURGICAL WAITING ROOM VISITATION Patients having surgery or a procedure may have no more than 2 support people in the waiting area - these visitors may rotate.   Children under the age of 82 must have an adult with them who is not the patient. If the patient needs to stay at the hospital during part of their recovery, the visitor guidelines for inpatient rooms apply. Pre-op nurse will coordinate an appropriate time for 1 support person to accompany patient in pre-op.  This support person may not rotate.   Please refer to the [facility] website for the visitor guidelines for Inpatients (after your surgery is over and you are in a regular room).    Special instructions:   [place]- Preparing For Surgery  Before surgery, you can play an important  role. Because skin is not sterile, your skin needs to be as free of germs as possible. You can reduce the number of germs on your skin by washing with CHG (chlorahexidine gluconate) Soap before surgery.  CHG is an antiseptic cleaner which kills germs and bonds with the skin to continue killing germs even after washing.    Oral Hygiene is also important to reduce your risk of infection.  Remember - BRUSH YOUR TEETH THE MORNING OF SURGERY WITH YOUR REGULAR TOOTHPASTE  Please do not use if you have an allergy to CHG or antibacterial soaps. If your skin becomes reddened/irritated stop using the CHG.  Do not shave (including legs and underarms) for at least 48 hours prior to first CHG shower. It is OK to shave your face.  Please follow these instructions carefully.   Shower the NIGHT BEFORE SURGERY and the MORNING OF SURGERY  If you chose to wash your hair, wash your hair first as usual with your normal shampoo.  After you shampoo, rinse your hair and body thoroughly  to remove the shampoo.  Use CHG Soap as you would any other liquid soap. You can apply CHG directly to the skin and wash gently with a scrungie or a clean washcloth.   Apply the CHG Soap to your body ONLY FROM THE NECK DOWN.  Do not use on open wounds or open sores. Avoid contact with your eyes, ears, mouth and genitals (private parts). Wash Face and genitals (private parts)  with your normal soap.   Wash thoroughly, paying special attention to the area where your surgery will be performed.  Thoroughly rinse your body with warm water from the neck down.  DO NOT shower/wash with your normal soap after using and rinsing off the CHG Soap.  Pat yourself dry with a CLEAN TOWEL.  Wear CLEAN PAJAMAS to bed the night before surgery  Place CLEAN SHEETS on your bed the night before your surgery  DO NOT SLEEP WITH PETS.   Day of Surgery: Take a shower with CHG soap. Do not wear jewelry or makeup Do not wear lotions, powders, perfumes/colognes, or deodorant. Do not shave 48 hours prior to surgery.  Men may shave face and neck. Do not bring valuables to the hospital.  [facility] is not responsible for any belongings or valuables. Do not wear nail polish, gel polish, artificial nails, or any other type of covering on natural nails (fingers and toes) If you have artificial nails or gel coating that need to be removed by a nail salon, please have this removed prior to surgery. Artificial nails or gel coating may interfere with anesthesia's ability to adequately monitor your vital signs. Wear Clean/Comfortable clothing the morning of surgery Remember to brush your teeth WITH YOUR REGULAR TOOTHPASTE.   Please read over the following fact sheets that you were given.    If you received a COVID test during your pre-op visit  it is requested that you wear a mask when out in public, stay away from anyone that may not be feeling well and notify your surgeon if you develop symptoms. If you have been in  contact with anyone that has tested positive in the last 10 days please notify you surgeon.

## 2022-07-29 ENCOUNTER — Other Ambulatory Visit: Payer: Self-pay

## 2022-07-29 ENCOUNTER — Encounter (HOSPITAL_COMMUNITY): Payer: Self-pay

## 2022-07-29 ENCOUNTER — Encounter (HOSPITAL_COMMUNITY)
Admission: RE | Admit: 2022-07-29 | Discharge: 2022-07-29 | Disposition: A | Discharge status: Home / Self Care | Payer: Medicare Other | Source: Ambulatory Visit | Attending: General Surgery | Admitting: General Surgery

## 2022-07-29 VITALS — BP 141/61 | HR 57 | Temp 98.1°F | Resp 18 | Ht 67.0 in | Wt 250.0 lb

## 2022-07-29 DIAGNOSIS — I10 Essential (primary) hypertension: Secondary | ICD-10-CM | POA: Diagnosis not present

## 2022-07-29 DIAGNOSIS — Z01810 Encounter for preprocedural cardiovascular examination: Secondary | ICD-10-CM | POA: Diagnosis not present

## 2022-07-29 HISTORY — DX: Hypothyroidism, unspecified: E03.9

## 2022-07-29 NOTE — Progress Notes (Signed)
PCP - Oliver Barre Cardiologist - jonathan berry in 2022- he did not state a follow up was needed. She was seen for bradycardia and cleared after receiving an echocardiogram  PPM/ICD - denies   Chest x-ray - n/a EKG -07/29/22  Stress Test -denies  ECHO - 05/13/20 Cardiac Cath - denies  Sleep Study - denies  Follow your surgeon's instructions on when to stop Aspirin.  If no instructions were given by your surgeon then you will need to call the office to get those instructions.     ERAS Protcol -yes PRE-SURGERY Ensure or G2- none ordered  COVID TEST- not needed   Anesthesia review: no  Patient denies shortness of breath, fever, cough and chest pain at PAT appointment   All instructions explained to the patient, with a verbal understanding of the material. Patient agrees to go over the instructions while at home for a better understanding. Patient also instructed to self quarantine after being tested for COVID-19. The opportunity to ask questions was provided.

## 2022-08-04 DIAGNOSIS — R928 Other abnormal and inconclusive findings on diagnostic imaging of breast: Secondary | ICD-10-CM | POA: Diagnosis not present

## 2022-08-04 DIAGNOSIS — N63 Unspecified lump in unspecified breast: Secondary | ICD-10-CM | POA: Diagnosis not present

## 2022-08-04 DIAGNOSIS — R922 Inconclusive mammogram: Secondary | ICD-10-CM | POA: Diagnosis not present

## 2022-08-04 NOTE — Anesthesia Preprocedure Evaluation (Signed)
Anesthesia Evaluation  Patient identified by MRN, date of birth, ID band Patient awake    Reviewed: Allergy & Precautions, NPO status , Patient's Chart, lab work & pertinent test results  Airway Mallampati: III  TM Distance: >3 FB Neck ROM: Full    Dental  (+) Missing, Dental Advisory Given,    Pulmonary neg pulmonary ROS   Pulmonary exam normal breath sounds clear to auscultation       Cardiovascular hypertension, Pt. on medications Normal cardiovascular exam Rhythm:Regular Rate:Normal  TTE 2022  1. Left ventricular ejection fraction, by estimation, is 55 to 60%. The  left ventricle has normal function. The left ventricle has no regional  wall motion abnormalities. Left ventricular diastolic parameters are  consistent with Grade I diastolic  dysfunction (impaired relaxation).   2. Right ventricular systolic function is normal. The right ventricular  size is normal. Tricuspid regurgitation signal is inadequate for assessing  PA pressure.   3. The mitral valve is normal in structure. No evidence of mitral valve  regurgitation. No evidence of mitral stenosis.   4. The aortic valve was not well visualized. Aortic valve regurgitation  is not visualized. Mild to moderate aortic valve sclerosis/calcification  is present, without any evidence of aortic stenosis.   5. The inferior vena cava is normal in size with greater than 50%  respiratory variability, suggesting right atrial pressure of 3 mmHg.     Neuro/Psych  PSYCHIATRIC DISORDERS Anxiety     MS, wheelchair bound negative neurological ROS     GI/Hepatic negative GI ROS, Neg liver ROS,,,  Endo/Other  Hypothyroidism    Renal/GU negative Renal ROS  negative genitourinary   Musculoskeletal negative musculoskeletal ROS (+)    Abdominal   Peds  Hematology negative hematology ROS (+)   Anesthesia Other Findings   Reproductive/Obstetrics                              Anesthesia Physical Anesthesia Plan  ASA: 2  Anesthesia Plan: General   Post-op Pain Management: Tylenol PO (pre-op)*   Induction: Intravenous  PONV Risk Score and Plan: 3 and Ondansetron, Dexamethasone and Treatment may vary due to age or medical condition  Airway Management Planned: LMA  Additional Equipment:   Intra-op Plan:   Post-operative Plan: Extubation in OR  Informed Consent: I have reviewed the patients History and Physical, chart, labs and discussed the procedure including the risks, benefits and alternatives for the proposed anesthesia with the patient or authorized representative who has indicated his/her understanding and acceptance.     Dental advisory given  Plan Discussed with: CRNA  Anesthesia Plan Comments:        Anesthesia Quick Evaluation

## 2022-08-05 ENCOUNTER — Other Ambulatory Visit: Payer: Self-pay

## 2022-08-05 ENCOUNTER — Encounter (HOSPITAL_COMMUNITY)
Admission: RE | Disposition: A | Discharge status: Home / Self Care | Payer: Self-pay | Source: Home / Self Care | Attending: General Surgery

## 2022-08-05 ENCOUNTER — Ambulatory Visit (HOSPITAL_BASED_OUTPATIENT_CLINIC_OR_DEPARTMENT_OTHER): Payer: Medicare Other | Admitting: Anesthesiology

## 2022-08-05 ENCOUNTER — Encounter (HOSPITAL_COMMUNITY): Payer: Self-pay | Admitting: General Surgery

## 2022-08-05 ENCOUNTER — Ambulatory Visit (HOSPITAL_COMMUNITY): Payer: Medicare Other | Admitting: Anesthesiology

## 2022-08-05 ENCOUNTER — Ambulatory Visit (HOSPITAL_COMMUNITY)
Admission: RE | Admit: 2022-08-05 | Discharge: 2022-08-05 | Disposition: A | Discharge status: Home / Self Care | Payer: Medicare Other | Attending: General Surgery | Admitting: General Surgery

## 2022-08-05 DIAGNOSIS — C50911 Malignant neoplasm of unspecified site of right female breast: Secondary | ICD-10-CM

## 2022-08-05 DIAGNOSIS — F419 Anxiety disorder, unspecified: Secondary | ICD-10-CM | POA: Diagnosis not present

## 2022-08-05 DIAGNOSIS — G35 Multiple sclerosis: Secondary | ICD-10-CM | POA: Diagnosis not present

## 2022-08-05 DIAGNOSIS — Z170 Estrogen receptor positive status [ER+]: Secondary | ICD-10-CM | POA: Diagnosis not present

## 2022-08-05 DIAGNOSIS — I10 Essential (primary) hypertension: Secondary | ICD-10-CM | POA: Insufficient documentation

## 2022-08-05 DIAGNOSIS — R922 Inconclusive mammogram: Secondary | ICD-10-CM | POA: Diagnosis not present

## 2022-08-05 DIAGNOSIS — Z79899 Other long term (current) drug therapy: Secondary | ICD-10-CM | POA: Diagnosis not present

## 2022-08-05 DIAGNOSIS — Z993 Dependence on wheelchair: Secondary | ICD-10-CM | POA: Diagnosis not present

## 2022-08-05 DIAGNOSIS — Z1231 Encounter for screening mammogram for malignant neoplasm of breast: Secondary | ICD-10-CM | POA: Insufficient documentation

## 2022-08-05 DIAGNOSIS — C50211 Malignant neoplasm of upper-inner quadrant of right female breast: Secondary | ICD-10-CM | POA: Diagnosis not present

## 2022-08-05 DIAGNOSIS — E039 Hypothyroidism, unspecified: Secondary | ICD-10-CM

## 2022-08-05 DIAGNOSIS — D0511 Intraductal carcinoma in situ of right breast: Secondary | ICD-10-CM | POA: Diagnosis not present

## 2022-08-05 DIAGNOSIS — N63 Unspecified lump in unspecified breast: Secondary | ICD-10-CM | POA: Diagnosis not present

## 2022-08-05 HISTORY — PX: BREAST LUMPECTOMY WITH RADIOACTIVE SEED LOCALIZATION: SHX6424

## 2022-08-05 SURGERY — BREAST LUMPECTOMY WITH RADIOACTIVE SEED LOCALIZATION
Anesthesia: General | Site: Breast | Laterality: Right

## 2022-08-05 MED ORDER — CEFAZOLIN SODIUM-DEXTROSE 2-4 GM/100ML-% IV SOLN
2.0000 g | INTRAVENOUS | Status: AC
  Administered 2022-08-05: 2 g via INTRAVENOUS

## 2022-08-05 MED ORDER — PHENYLEPHRINE HCL-NACL 20-0.9 MG/250ML-% IV SOLN
INTRAVENOUS | Status: DC | PRN
  Administered 2022-08-05: 30 ug/min via INTRAVENOUS

## 2022-08-05 MED ORDER — HYDRALAZINE HCL 20 MG/ML IJ SOLN
INTRAMUSCULAR | Status: AC
  Filled 2022-08-05: qty 1

## 2022-08-05 MED ORDER — PHENYLEPHRINE 80 MCG/ML (10ML) SYRINGE FOR IV PUSH (FOR BLOOD PRESSURE SUPPORT)
PREFILLED_SYRINGE | INTRAVENOUS | Status: DC | PRN
  Administered 2022-08-05: 80 ug via INTRAVENOUS
  Administered 2022-08-05: 160 ug via INTRAVENOUS

## 2022-08-05 MED ORDER — PROPOFOL 10 MG/ML IV BOLUS
INTRAVENOUS | Status: AC
  Filled 2022-08-05: qty 20

## 2022-08-05 MED ORDER — PHENYLEPHRINE 80 MCG/ML (10ML) SYRINGE FOR IV PUSH (FOR BLOOD PRESSURE SUPPORT)
PREFILLED_SYRINGE | INTRAVENOUS | Status: AC
  Filled 2022-08-05: qty 10

## 2022-08-05 MED ORDER — GABAPENTIN 100 MG PO CAPS: 100.0000 mg | ORAL_CAPSULE | ORAL | Status: AC

## 2022-08-05 MED ORDER — FENTANYL CITRATE (PF) 100 MCG/2ML IJ SOLN
INTRAMUSCULAR | Status: AC
  Filled 2022-08-05: qty 2

## 2022-08-05 MED ORDER — HYDRALAZINE HCL 20 MG/ML IJ SOLN
5.0000 mg | Freq: Once | INTRAMUSCULAR | Status: AC
  Administered 2022-08-05: 5 mg via INTRAVENOUS

## 2022-08-05 MED ORDER — OXYCODONE HCL 5 MG PO TABS
5.0000 mg | ORAL_TABLET | Freq: Four times a day (QID) | ORAL | 0 refills | Status: AC | PRN
Start: 2022-08-05 — End: ?

## 2022-08-05 MED ORDER — CEFAZOLIN SODIUM-DEXTROSE 2-4 GM/100ML-% IV SOLN
INTRAVENOUS | Status: AC
  Filled 2022-08-05: qty 100

## 2022-08-05 MED ORDER — ONDANSETRON HCL 4 MG/2ML IJ SOLN
INTRAMUSCULAR | Status: AC
  Filled 2022-08-05: qty 2

## 2022-08-05 MED ORDER — DEXAMETHASONE SODIUM PHOSPHATE 10 MG/ML IJ SOLN
INTRAMUSCULAR | Status: DC | PRN
  Administered 2022-08-05: 5 mg via INTRAVENOUS

## 2022-08-05 MED ORDER — LIDOCAINE 2% (20 MG/ML) 5 ML SYRINGE
INTRAMUSCULAR | Status: DC | PRN
  Administered 2022-08-05: 60 mg via INTRAVENOUS

## 2022-08-05 MED ORDER — DEXAMETHASONE SODIUM PHOSPHATE 10 MG/ML IJ SOLN
INTRAMUSCULAR | Status: AC
  Filled 2022-08-05: qty 1

## 2022-08-05 MED ORDER — FENTANYL CITRATE (PF) 250 MCG/5ML IJ SOLN
INTRAMUSCULAR | Status: AC
  Filled 2022-08-05: qty 5

## 2022-08-05 MED ORDER — EPHEDRINE SULFATE-NACL 50-0.9 MG/10ML-% IV SOSY
PREFILLED_SYRINGE | INTRAVENOUS | Status: DC | PRN
  Administered 2022-08-05: 5 mg via INTRAVENOUS

## 2022-08-05 MED ORDER — FENTANYL CITRATE (PF) 100 MCG/2ML IJ SOLN
25.0000 ug | INTRAMUSCULAR | Status: DC | PRN
  Administered 2022-08-05: 25 ug via INTRAVENOUS

## 2022-08-05 MED ORDER — FENTANYL CITRATE (PF) 250 MCG/5ML IJ SOLN
INTRAMUSCULAR | Status: DC | PRN
  Administered 2022-08-05: 25 ug via INTRAVENOUS

## 2022-08-05 MED ORDER — LACTATED RINGERS IV SOLN: INTRAVENOUS | Status: DC

## 2022-08-05 MED ORDER — CHLORHEXIDINE GLUCONATE CLOTH 2 % EX PADS: 6.0000 | MEDICATED_PAD | Freq: Once | CUTANEOUS | Status: DC

## 2022-08-05 MED ORDER — BUPIVACAINE HCL (PF) 0.25 % IJ SOLN
INTRAMUSCULAR | Status: DC | PRN
  Administered 2022-08-05: 20 mL

## 2022-08-05 MED ORDER — ACETAMINOPHEN 500 MG PO TABS
ORAL_TABLET | ORAL | Status: AC
  Administered 2022-08-05: 1000 mg via ORAL
  Filled 2022-08-05: qty 2

## 2022-08-05 MED ORDER — CHLORHEXIDINE GLUCONATE 0.12 % MT SOLN
OROMUCOSAL | Status: AC
  Administered 2022-08-05: 15 mL via OROMUCOSAL
  Filled 2022-08-05: qty 15

## 2022-08-05 MED ORDER — 0.9 % SODIUM CHLORIDE (POUR BTL) OPTIME
TOPICAL | Status: DC | PRN
  Administered 2022-08-05: 1000 mL

## 2022-08-05 MED ORDER — LIDOCAINE 2% (20 MG/ML) 5 ML SYRINGE
INTRAMUSCULAR | Status: AC
  Filled 2022-08-05: qty 5

## 2022-08-05 MED ORDER — PROPOFOL 10 MG/ML IV BOLUS
INTRAVENOUS | Status: DC | PRN
  Administered 2022-08-05: 50 mg via INTRAVENOUS
  Administered 2022-08-05: 100 mg via INTRAVENOUS

## 2022-08-05 MED ORDER — ACETAMINOPHEN 500 MG PO TABS: 1000.0000 mg | ORAL_TABLET | ORAL | Status: AC

## 2022-08-05 MED ORDER — ORAL CARE MOUTH RINSE: 15.0000 mL | Freq: Once | OROMUCOSAL | Status: AC

## 2022-08-05 MED ORDER — CHLORHEXIDINE GLUCONATE 0.12 % MT SOLN: 15.0000 mL | Freq: Once | OROMUCOSAL | Status: AC

## 2022-08-05 MED ORDER — EPHEDRINE 5 MG/ML INJ
INTRAVENOUS | Status: AC
  Filled 2022-08-05: qty 5

## 2022-08-05 MED ORDER — BUPIVACAINE HCL (PF) 0.25 % IJ SOLN
INTRAMUSCULAR | Status: AC
  Filled 2022-08-05: qty 30

## 2022-08-05 MED ORDER — GABAPENTIN 100 MG PO CAPS
ORAL_CAPSULE | ORAL | Status: AC
  Administered 2022-08-05: 100 mg via ORAL
  Filled 2022-08-05: qty 1

## 2022-08-05 SURGICAL SUPPLY — 35 items
ADH SKN CLS APL DERMABOND .7 (GAUZE/BANDAGES/DRESSINGS) ×1
APL PRP STRL LF DISP 70% ISPRP (MISCELLANEOUS) ×1
APPLIER CLIP 9.375 MED OPEN (MISCELLANEOUS)
APR CLP MED 9.3 20 MLT OPN (MISCELLANEOUS)
BAG COUNTER SPONGE SURGICOUNT (BAG) IMPLANT
BAG SPNG CNTER NS LX DISP (BAG)
BINDER BREAST LRG (GAUZE/BANDAGES/DRESSINGS) IMPLANT
BINDER BREAST XLRG (GAUZE/BANDAGES/DRESSINGS) IMPLANT
CANISTER SUCT 3000ML PPV (MISCELLANEOUS) ×2 IMPLANT
CHLORAPREP W/TINT 26 (MISCELLANEOUS) ×2 IMPLANT
CLIP APPLIE 9.375 MED OPEN (MISCELLANEOUS) IMPLANT
COVER PROBE W GEL 5X96 (DRAPES) ×2 IMPLANT
COVER SURGICAL LIGHT HANDLE (MISCELLANEOUS) ×2 IMPLANT
DERMABOND ADVANCED .7 DNX12 (GAUZE/BANDAGES/DRESSINGS) ×2 IMPLANT
DEVICE DUBIN SPECIMEN MAMMOGRA (MISCELLANEOUS) ×2 IMPLANT
DRAPE CHEST BREAST 15X10 FENES (DRAPES) ×2 IMPLANT
ELECT COATED BLADE 2.86 ST (ELECTRODE) ×2 IMPLANT
ELECT REM PT RETURN 9FT ADLT (ELECTROSURGICAL) ×1
ELECTRODE REM PT RTRN 9FT ADLT (ELECTROSURGICAL) ×2 IMPLANT
GLOVE BIO SURGEON STRL SZ7.5 (GLOVE) ×4 IMPLANT
GOWN STRL REUS W/ TWL LRG LVL3 (GOWN DISPOSABLE) ×4 IMPLANT
GOWN STRL REUS W/TWL LRG LVL3 (GOWN DISPOSABLE) ×2
KIT BASIN OR (CUSTOM PROCEDURE TRAY) ×2 IMPLANT
KIT MARKER MARGIN INK (KITS) ×2 IMPLANT
LIGHT WAVEGUIDE WIDE FLAT (MISCELLANEOUS) IMPLANT
NDL HYPO 25GX1X1/2 BEV (NEEDLE) ×2 IMPLANT
NEEDLE HYPO 25GX1X1/2 BEV (NEEDLE) ×1 IMPLANT
NS IRRIG 1000ML POUR BTL (IV SOLUTION) ×2 IMPLANT
PACK GENERAL/GYN (CUSTOM PROCEDURE TRAY) ×2 IMPLANT
SUT MNCRL AB 4-0 PS2 18 (SUTURE) ×2 IMPLANT
SUT SILK 2 0 SH (SUTURE) IMPLANT
SUT VIC AB 3-0 SH 18 (SUTURE) ×2 IMPLANT
SYR CONTROL 10ML LL (SYRINGE) ×2 IMPLANT
TOWEL GREEN STERILE (TOWEL DISPOSABLE) ×2 IMPLANT
TOWEL GREEN STERILE FF (TOWEL DISPOSABLE) ×2 IMPLANT

## 2022-08-05 NOTE — Transfer of Care (Signed)
Immediate Anesthesia Transfer of Care Note  Patient: Deanna Wall  Procedure(s) Performed: RIGHT BREAST LUMPECTOMY WITH RADIOACTIVE SEED LOCALIZATION (Right: Breast)  Patient Location: PACU  Anesthesia Type:General  Level of Consciousness: awake and alert   Airway & Oxygen Therapy: Patient Spontanous Breathing  Post-op Assessment: Report given to RN and Post -op Vital signs reviewed and stable  Post vital signs: Reviewed and stable  Last Vitals:  Vitals Value Taken Time  BP 188/76 08/05/22 0925  Temp    Pulse 63 08/05/22 0926  Resp 12 08/05/22 0926  SpO2 95 % 08/05/22 0926  Vitals shown include unvalidated device data.  Last Pain:  Vitals:   08/05/22 0709  TempSrc:   PainSc: 0-No pain         Complications: No notable events documented.

## 2022-08-05 NOTE — Anesthesia Procedure Notes (Signed)
Procedure Name: LMA Insertion Date/Time: 08/05/2022 8:38 AM  Performed by: Katina Degree, CRNAPre-anesthesia Checklist: Patient identified, Emergency Drugs available, Suction available and Patient being monitored Patient Re-evaluated:Patient Re-evaluated prior to induction Oxygen Delivery Method: Circle system utilized Preoxygenation: Pre-oxygenation with 100% oxygen Induction Type: IV induction Ventilation: Mask ventilation without difficulty LMA: LMA inserted LMA Size: 4.0 Laser Tube: Cuffed inflated with minimal occlusive pressure - saline Placement Confirmation: positive ETCO2 and breath sounds checked- equal and bilateral Tube secured with: Tape Dental Injury: Teeth and Oropharynx as per pre-operative assessment

## 2022-08-05 NOTE — H&P (Signed)
REFERRING PHYSICIAN: Malachy Mood, MD PROVIDER: Lindell Noe, MD MRN: [id number] DOB: 07-25-1931 Subjective   Chief Complaint: Breast Cancer  History of Present Illness: Deanna Wall is a 89 y.o. female who is seen today as an office consultation for evaluation of Breast Cancer  We are asked to see the patient in consultation by Dr. Mosetta Putt to evaluate her for a new right breast cancer. The patient is a 89 year old black female who recently went for a routine screening mammogram. At that time she was found to have a 1.2 cm mass in the upper inner quadrant of the right breast. The axilla looked normal. The mass was biopsied and came back as a grade 2 invasive ductal cancer that was ER and PR positive and HER2 negative with a Ki-67 of 10%. Her main medical problem is that she has multiple sclerosis and is wheelchair-bound. She does not smoke. She denies any chest pain.  Review of Systems: A complete review of systems was obtained from the patient. I have reviewed this information and discussed as appropriate with the patient. See HPI as well for other ROS.  ROS   Medical History: Past Medical History:  Diagnosis Date  Anemia  Anxiety  Arrhythmia  Arthritis  Glaucoma (increased eye pressure)  History of cancer  Hyperlipidemia  Hypertension  Thyroid disease   Patient Active Problem List  Diagnosis  Anemia, pernicious  Anxiety  Arthritis of hand, degenerative  Essential hypertension  Hyperlipidemia  Hypothyroidism  Malignant neoplasm of upper-outer quadrant of right breast in female, estrogen receptor positive (CMS/HHS-HCC)  Malignant neoplasm of upper-inner quadrant of right breast in female, estrogen receptor positive (CMS/HHS-HCC)   Past Surgical History:  Procedure Laterality Date  ORIF DISTAL FEMUR FRACTURE Right 05/13/2020  .Tubal Ligation N/A  1958    Allergies  Allergen Reactions  Atorvastatin Unknown  REACTION: feels funny in face and head   Current Outpatient  Medications on File Prior to Visit  Medication Sig Dispense Refill  cloNIDine HCL (CATAPRES) 0.3 MG tablet Take 1 tablet by mouth 2 (two) times daily  dorzolamide (TRUSOPT) 2 % ophthalmic solution Place 1 drop into both eyes 2 (two) times daily  ezetimibe (ZETIA) 10 mg tablet Take 1 tablet by mouth once daily  FUROsemide (LASIX) 20 MG tablet Take 20 mg by mouth once daily as needed for Edema  latanoprost (XALATAN) 0.005 % ophthalmic solution Place 1 drop into both eyes at bedtime  potassium chloride (KLOR-CON) 10 MEQ ER tablet Take 1 tablet by mouth once daily  aspirin 81 MG EC tablet Take 81 mg by mouth once daily  levothyroxine (SYNTHROID) 25 MCG tablet Take 25 mcg by mouth once daily   No current facility-administered medications on file prior to visit.   Family History  Problem Relation Age of Onset  Bone cancer Mother  Diabetes Sister    Social History   Tobacco Use  Smoking Status Never  Smokeless Tobacco Never    Social History   Socioeconomic History  Marital status: Widowed  Tobacco Use  Smoking status: Never  Smokeless tobacco: Never  Vaping Use  Vaping status: Never Used  Substance and Sexual Activity  Alcohol use: Not Currently  Drug use: Never   Social Determinants of Health   Financial Resource Strain: Low Risk (10/10/2021)  Received from [facility]  Overall Financial Resource Strain (CARDIA)  Difficulty of Paying Living Expenses: Not hard at all  Food Insecurity: No Food Insecurity (10/10/2021)  Received from [facility]  Hunger Vital Sign  Worried About Programme researcher, broadcasting/film/video in the Last Year: Never true  Ran Out of Food in the Last Year: Never true  Transportation Needs: No Transportation Needs (10/10/2021)  Received from [facility] - Transportation  Lack of Transportation (Medical): No  Lack of Transportation (Non-Medical): No  Physical Activity: Insufficiently Active (10/10/2021)  Received from [facility]  Exercise Vital Sign  Days of  Exercise per Week: 3 days  Minutes of Exercise per Session: 30 min  Stress: No Stress Concern Present (10/10/2021)  Received from [facility] - Occupational Stress Questionnaire  Feeling of Stress : Not at all  Social Connections: Moderately Isolated (10/10/2021)  Received from [facility]  Social Connection and Isolation Panel [NHANES]  Frequency of Communication with Friends and Family: Three times a week  Frequency of Social Gatherings with Friends and Family: Three times a week  Attends Religious Services: More than 4 times per year  Active Member of Clubs or Organizations: No  Attends Banker Meetings: Never  Marital Status: Widowed   Objective:  There were no vitals filed for this visit.  There is no height or weight on file to calculate BMI.  Physical Exam Vitals reviewed.  Constitutional:  General: She is not in acute distress. Appearance: Normal appearance.  HENT:  Head: Normocephalic and atraumatic.  Right Ear: External ear normal.  Left Ear: External ear normal.  Nose: Nose normal.  Mouth/Throat:  Mouth: Mucous membranes are moist.  Pharynx: Oropharynx is clear.  Eyes:  General: No scleral icterus. Extraocular Movements: Extraocular movements intact.  Conjunctiva/sclera: Conjunctivae normal.  Pupils: Pupils are equal, round, and reactive to light.  Cardiovascular:  Rate and Rhythm: Normal rate and regular rhythm.  Pulses: Normal pulses.  Heart sounds: Normal heart sounds.  Pulmonary:  Effort: Pulmonary effort is normal. No respiratory distress.  Breath sounds: Normal breath sounds.  Abdominal:  General: Bowel sounds are normal.  Palpations: Abdomen is soft.  Tenderness: There is no abdominal tenderness.  Musculoskeletal:  General: No swelling, tenderness or deformity. Normal range of motion.  Cervical back: Normal range of motion and neck supple.  Skin: General: Skin is warm and dry.  Coloration:  Skin is not jaundiced.  Neurological:  General: No focal deficit present.  Mental Status: She is alert and oriented to person, place, and time.  Psychiatric:  Mood and Affect: Mood normal.  Behavior: Behavior normal.     Breast: There is no palpable mass in either breast. There is no palpable axillary, supraclavicular, or cervical lymphadenopathy.  Labs, Imaging and Diagnostic Testing:  Assessment and Plan:   Diagnoses and all orders for this visit:  Malignant neoplasm of upper-inner quadrant of right breast in female, estrogen receptor positive (CMS/HHS-HCC)   The patient appears to have a small 1.2 cm cancer in the upper inner quadrant of the right breast with clinically negative nodes and favorable markers. I have discussed with her and her family the different options for treatment and at this point she favors breast conservation which I feel is very reasonable. I have discussed with her in detail the risk and benefits of the operation as well as some of the technical aspects including the use of a radioactive seed for localization and she understands and wishes to proceed. She will also meet today with medical and radiation oncology to discuss adjuvant therapy. We will begin surgical scheduling. She will not need a node evaluation

## 2022-08-05 NOTE — Interval H&P Note (Signed)
History and Physical Interval Note:  08/05/2022 8:03 AM  Deanna Wall  has presented today for surgery, with the diagnosis of RIGHT BREAST CANCER.  The various methods of treatment have been discussed with the patient and family. After consideration of risks, benefits and other options for treatment, the patient has consented to  Procedure(s): RIGHT BREAST LUMPECTOMY WITH RADIOACTIVE SEED LOCALIZATION (Right) as a surgical intervention.  The patient's history has been reviewed, patient examined, no change in status, stable for surgery.  I have reviewed the patient's chart and labs.  Questions were answered to the patient's satisfaction.     Chevis Pretty III

## 2022-08-05 NOTE — Op Note (Signed)
08/05/2022  9:15 AM  PATIENT:  Deanna Wall  89 y.o. female  PRE-OPERATIVE DIAGNOSIS:  RIGHT BREAST CANCER  POST-OPERATIVE DIAGNOSIS:  RIGHT BREAST CANCER  PROCEDURE:  Procedure(s): RIGHT BREAST LUMPECTOMY WITH RADIOACTIVE SEED LOCALIZATION (Right)  SURGEON:  Surgeon(s) and Role:    * Griselda Miner, MD - Primary  PHYSICIAN ASSISTANT:   ASSISTANTS: none   ANESTHESIA:   local and general  EBL:  minimal   BLOOD ADMINISTERED:none  DRAINS: none   LOCAL MEDICATIONS USED:  MARCAINE     SPECIMEN:  Source of Specimen:  right breast tissue  DISPOSITION OF SPECIMEN:  PATHOLOGY  COUNTS:  YES  TOURNIQUET:  * No tourniquets in log *  DICTATION: .Dragon Dictation  After informed consent was obtained the patient was brought to the operating room and placed in the supine position on the operating table.  After adequate induction of general anesthesia the patient's right breast was prepped with ChloraPrep, allowed to dry, and draped in usual sterile manner.  An appropriate timeout was performed.  Previously an I-125 seed was placed in the upper portion of the right breast to mark an area of invasive breast cancer.  The neoprobe was set to I-125 in the area of radioactivity was readily identified.  The area around this was infiltrated with quarter percent Marcaine.  I elected to make a curvilinear incision in the upper portion of the right breast overlying the area of radioactivity with a 15 blade knife.  The incision was carried through the skin and subcutaneous tissue sharply with the electrocautery.  Dissection was then carried towards the radioactive seed under the direction of the neoprobe.  Once I more closely approach the radioactive seed I then removed a circular portion of breast tissue sharply with the electrocautery around the radioactive seed while checking the area of radioactivity frequently.  Once the specimen was removed it was oriented with the appropriate paint colors.  A  specimen radiograph was obtained that showed the clip and seed to be near the center of the specimen.  The specimen was then sent to pathology for further evaluation.  Hemostasis was achieved using the Bovie electrocautery.  The wound was irrigated with saline and infiltrated with more quarter percent Marcaine.  The deep layer of the incision was then closed with layers of interrupted 3-0 Vicryl stitches.  The skin was then closed with a running 4-0 Monocryl subcuticular stitch.  Dermabond dressings were applied.  The patient tolerated the procedure well.  At the end of the case all needle sponge and instrument counts were correct.  The patient was then awakened and taken to recovery in stable condition.  PLAN OF CARE: Discharge to home after PACU  PATIENT DISPOSITION:  PACU - hemodynamically stable.   Delay start of Pharmacological VTE agent (>24hrs) due to surgical blood loss or risk of bleeding: not applicable

## 2022-08-05 NOTE — Anesthesia Postprocedure Evaluation (Signed)
Anesthesia Post Note  Patient: Deanna Wall  Procedure(s) Performed: RIGHT BREAST LUMPECTOMY WITH RADIOACTIVE SEED LOCALIZATION (Right: Breast)     Patient location during evaluation: PACU Anesthesia Type: General Level of consciousness: awake and alert Pain management: pain level controlled Vital Signs Assessment: post-procedure vital signs reviewed and stable Respiratory status: spontaneous breathing, nonlabored ventilation, respiratory function stable and patient connected to nasal cannula oxygen Cardiovascular status: blood pressure returned to baseline and stable Postop Assessment: no apparent nausea or vomiting Anesthetic complications: no  No notable events documented.  Last Vitals:  Vitals:   08/05/22 1000 08/05/22 1012  BP: (!) 205/75 (!) 186/73  Pulse: (!) 57 (!) 53  Resp: 12 13  Temp: (!) 36.4 C   SpO2: 94% 93%    Last Pain:  Vitals:   08/05/22 1000  TempSrc:   PainSc: 0-No pain                 Suttyn Cryder L Denijah Karrer

## 2022-08-06 ENCOUNTER — Encounter (HOSPITAL_COMMUNITY): Payer: Self-pay | Admitting: General Surgery

## 2022-08-07 DIAGNOSIS — C50911 Malignant neoplasm of unspecified site of right female breast: Secondary | ICD-10-CM | POA: Diagnosis not present

## 2022-08-07 LAB — SURGICAL PATHOLOGY

## 2022-08-11 ENCOUNTER — Encounter: Payer: Self-pay | Admitting: *Deleted

## 2022-08-11 DIAGNOSIS — Z170 Estrogen receptor positive status [ER+]: Secondary | ICD-10-CM

## 2022-08-18 ENCOUNTER — Other Ambulatory Visit: Payer: Self-pay

## 2022-08-18 ENCOUNTER — Other Ambulatory Visit: Payer: Self-pay | Admitting: Internal Medicine

## 2022-08-20 ENCOUNTER — Ambulatory Visit (INDEPENDENT_AMBULATORY_CARE_PROVIDER_SITE_OTHER): Payer: Medicare Other | Admitting: Internal Medicine

## 2022-08-20 ENCOUNTER — Encounter: Payer: Self-pay | Admitting: Internal Medicine

## 2022-08-20 VITALS — BP 120/80 | HR 50 | Temp 97.7°F | Ht 67.0 in

## 2022-08-20 DIAGNOSIS — Z0000 Encounter for general adult medical examination without abnormal findings: Secondary | ICD-10-CM

## 2022-08-20 DIAGNOSIS — E559 Vitamin D deficiency, unspecified: Secondary | ICD-10-CM | POA: Diagnosis not present

## 2022-08-20 DIAGNOSIS — E538 Deficiency of other specified B group vitamins: Secondary | ICD-10-CM | POA: Diagnosis not present

## 2022-08-20 DIAGNOSIS — E782 Mixed hyperlipidemia: Secondary | ICD-10-CM | POA: Diagnosis not present

## 2022-08-20 DIAGNOSIS — R7302 Impaired glucose tolerance (oral): Secondary | ICD-10-CM | POA: Diagnosis not present

## 2022-08-20 DIAGNOSIS — Z0001 Encounter for general adult medical examination with abnormal findings: Secondary | ICD-10-CM

## 2022-08-20 DIAGNOSIS — I10 Essential (primary) hypertension: Secondary | ICD-10-CM

## 2022-08-20 DIAGNOSIS — D509 Iron deficiency anemia, unspecified: Secondary | ICD-10-CM

## 2022-08-20 LAB — LIPID PANEL
Cholesterol: 170 mg/dL (ref 0–200)
HDL: 48.5 mg/dL (ref >39.00)
LDL Cholesterol: 101 mg/dL — ABNORMAL HIGH (ref 0–99)
NonHDL: 121.21
Total CHOL/HDL Ratio: 3
Triglycerides: 99 mg/dL (ref 0.0–149.0)
VLDL: 19.8 mg/dL (ref 0.0–40.0)

## 2022-08-20 LAB — CBC WITH DIFFERENTIAL/PLATELET
Basophils Absolute: 0 10*3/uL (ref 0.0–0.1)
Basophils Relative: 0.6 % (ref 0.0–3.0)
Eosinophils Absolute: 0.1 10*3/uL (ref 0.0–0.7)
Eosinophils Relative: 1.5 % (ref 0.0–5.0)
HCT: 33.9 % — ABNORMAL LOW (ref 36.0–46.0)
Hemoglobin: 11.1 g/dL — ABNORMAL LOW (ref 12.0–15.0)
Lymphocytes Relative: 33.7 % (ref 12.0–46.0)
Lymphs Abs: 2.1 10*3/uL (ref 0.7–4.0)
MCHC: 32.8 g/dL (ref 30.0–36.0)
MCV: 85.7 fl (ref 78.0–100.0)
Monocytes Absolute: 0.4 10*3/uL (ref 0.1–1.0)
Monocytes Relative: 6.8 % (ref 3.0–12.0)
Neutro Abs: 3.5 10*3/uL (ref 1.4–7.7)
Neutrophils Relative %: 57.4 % (ref 43.0–77.0)
Platelets: 346 10*3/uL (ref 150.0–400.0)
RBC: 3.95 Mil/uL (ref 3.87–5.11)
RDW: 14.6 % (ref 11.5–15.5)
WBC: 6.1 10*3/uL (ref 4.0–10.5)

## 2022-08-20 LAB — IBC PANEL
Iron: 65 ug/dL (ref 42–145)
Saturation Ratios: 23.6 % (ref 20.0–50.0)
TIBC: 275.8 ug/dL (ref 250.0–450.0)
Transferrin: 197 mg/dL — ABNORMAL LOW (ref 212.0–360.0)

## 2022-08-20 LAB — HEPATIC FUNCTION PANEL
ALT: 11 U/L (ref 0–35)
AST: 15 U/L (ref 0–37)
Albumin: 3.5 g/dL (ref 3.5–5.2)
Alkaline Phosphatase: 82 U/L (ref 39–117)
Bilirubin, Direct: 0.1 mg/dL (ref 0.0–0.3)
Total Bilirubin: 0.5 mg/dL (ref 0.2–1.2)
Total Protein: 7.1 g/dL (ref 6.0–8.3)

## 2022-08-20 LAB — VITAMIN B12: Vitamin B-12: 1136 pg/mL — ABNORMAL HIGH (ref 211–911)

## 2022-08-20 LAB — BASIC METABOLIC PANEL
BUN: 11 mg/dL (ref 6–23)
CO2: 26 mEq/L (ref 19–32)
Calcium: 8.7 mg/dL (ref 8.4–10.5)
Chloride: 106 mEq/L (ref 96–112)
Creatinine, Ser: 0.92 mg/dL (ref 0.40–1.20)
GFR: 55.51 mL/min — ABNORMAL LOW (ref >60.00)
Glucose, Bld: 114 mg/dL — ABNORMAL HIGH (ref 70–99)
Potassium: 3.7 mEq/L (ref 3.5–5.1)
Sodium: 140 mEq/L (ref 135–145)

## 2022-08-20 LAB — FERRITIN: Ferritin: 62.8 ng/mL (ref 10.0–291.0)

## 2022-08-20 LAB — VITAMIN D 25 HYDROXY (VIT D DEFICIENCY, FRACTURES): VITD: 70.92 ng/mL (ref 30.00–100.00)

## 2022-08-20 LAB — TSH: TSH: 3.19 u[IU]/mL (ref 0.35–5.50)

## 2022-08-20 LAB — HEMOGLOBIN A1C: Hgb A1c MFr Bld: 6.5 % (ref 4.6–6.5)

## 2022-08-20 NOTE — Progress Notes (Signed)
Patient ID: Deanna Wall, female   DOB: 1931/09/14, 89 y.o.   MRN: [id number]         Chief Complaint:: wellness exam and low b12, hld, iron def, hyperglycemia, htn, low vit d       HPI:  Deanna Wall is a 89 y.o. female here for wellness exam; for shingrix and tdap at pharmacy, declines dxa, o/w up to date; remains wheelchair bound                        Also did have right breast malignancy with lumpectomy uncomplicated approx 2 wks ago. Pt denies chest pain, increased sob or doe, wheezing, orthopnea, PND, increased LE swelling, palpitations, dizziness or syncope.   Pt denies polydipsia, polyuria, or new focal neuro s/s.    Pt denies fever, wt loss, night sweats, loss of appetite, or other constitutional symptoms  No skin wounds   Wt Readings from Last 3 Encounters:  08/05/22 250 lb (113.4 kg)  07/29/22 250 lb (113.4 kg)  05/13/20 203 lb 11.3 oz (92.4 kg)   BP Readings from Last 3 Encounters:  08/20/22 120/80  08/05/22 (!) 186/73  07/29/22 (!) 141/61   Immunization History  Administered Date(s) Administered   Fluad Quad(high Dose 65+) 02/17/2021   Influenza Split 12/31/2010, 12/07/2011   Influenza Whole 03/23/2005, 12/09/2007, 12/17/2008, 02/04/2010   Influenza, High Dose Seasonal PF 12/23/2016, 12/26/2018   Influenza,inj,Quad PF,6+ Mos 12/15/2012, 11/09/2013, 12/25/2014, 11/13/2015, 01/14/2018   Influenza-Unspecified 12/26/2019, 12/16/2021   PFIZER(Purple Top)SARS-COV-2 Vaccination 04/12/2019, 05/03/2019, 02/09/2020, 12/16/2021   Pneumococcal Conjugate-13 05/12/2013   Pneumococcal Polysaccharide-23 03/23/2005, 12/31/2010   Td 03/24/1999, 02/04/2010   Zoster, Live 12/09/2007   Health Maintenance Due  Topic Date Due   Zoster Vaccines- Shingrix (1 of 2) Never done   DEXA SCAN  Never done   DTaP/Tdap/Td (3 - Tdap) 02/05/2020   Medicare Annual Wellness (AWV)  10/11/2022      Past Medical History:  Diagnosis Date   ANEMIA, PERNICIOUS 01/30/2009   Breast cancer (HCC)     Cataracts, bilateral    DEGENERATIVE DISC DISEASE 10/28/2006   ECZEMA 10/28/2006   Gait disorder    Glaucoma    HYPERLIPIDEMIA 02/16/2010   HYPERTENSION 10/28/2006   Hypothyroidism    Impaired glucose tolerance 12/31/2010   MS (multiple sclerosis) (HCC)    Multiple sclerosis (HCC) 10/28/2006   Obesity    OSTEOARTHRITIS, KNEES, BILATERAL, SEVERE 06/14/2009   Sebaceous cyst    chest    VITAMIN B12 DEFICIENCY 06/14/2009   Past Surgical History:  Procedure Laterality Date   BREAST LUMPECTOMY WITH RADIOACTIVE SEED LOCALIZATION Right 08/05/2022   Procedure: RIGHT BREAST LUMPECTOMY WITH RADIOACTIVE SEED LOCALIZATION;  Surgeon: Griselda Miner, MD;  Location: [facility] OR;  Service: General;  Laterality: Right;   COLONOSCOPY     ORIF FEMUR FRACTURE Right 05/13/2020   Procedure: OPEN REDUCTION INTERNAL FIXATION (ORIF) DISTAL FEMUR FRACTURE;  Surgeon: Juanell Fairly, MD;  Location: [facility] ORS;  Service: Orthopedics;  Laterality: Right;   TUBAL LIGATION  1958   UPPER GASTROINTESTINAL ENDOSCOPY      reports that she has never smoked. She has never used smokeless tobacco. She reports that she does not drink alcohol and does not use drugs. family history includes Arthritis in her mother and sister; Bone cancer in her mother; Diabetes in her sister; Prostate cancer in her cousin; Stomach cancer in her maternal aunt. Allergies  Allergen Reactions   Atorvastatin     REACTION:  feels funny in face and head   Current Outpatient Medications on File Prior to Visit  Medication Sig Dispense Refill   amLODipine (NORVASC) 5 MG tablet Take 1 tablet by mouth once daily 90 tablet 2   aspirin 81 MG tablet Take 81 mg by mouth daily.     cholecalciferol (VITAMIN D3) 25 MCG (1000 UNIT) tablet Take 2,000 Units by mouth daily.     citalopram (CELEXA) 10 MG tablet Take 1 tablet by mouth once daily 90 tablet 2   cloNIDine (CATAPRES) 0.3 MG tablet Take 1 tablet by mouth twice daily (Patient taking differently: Take 0.3 mg  by mouth 2 (two) times daily.) 180 tablet 3   diclofenac sodium (VOLTAREN) 1 % GEL Apply 2 g topically 4 (four) times daily as needed. 200 g 5   dorzolamide (TRUSOPT) 2 % ophthalmic solution 1 drop 2 (two) times daily.     ezetimibe (ZETIA) 10 MG tablet Take 1 tablet by mouth once daily 90 tablet 0   furosemide (LASIX) 20 MG tablet TAKE 1 TABLET BY MOUTH ONCE DAILY AS NEEDED FOR  SWELLING (Patient taking differently: Take 20 mg by mouth daily.) 90 tablet 2   latanoprost (XALATAN) 0.005 % ophthalmic solution Place 1 drop into both eyes at bedtime.  3   levothyroxine (SYNTHROID) 25 MCG tablet Take 1 tablet by mouth once daily 90 tablet 0   nitrofurantoin (MACRODANTIN) 50 MG capsule Take 1 capsule by mouth once daily 30 capsule 5   oxyCODONE (ROXICODONE) 5 MG immediate release tablet Take 1 tablet (5 mg total) by mouth every 6 (six) hours as needed for severe pain. 10 tablet 0   polyethylene glycol powder (GLYCOLAX/MIRALAX) 17 GM/SCOOP powder Take 17 g by mouth 2 (two) times daily as needed. 3350 g 1   potassium chloride (KLOR-CON) 10 MEQ tablet Take 1 tablet (10 mEq total) by mouth daily. Take 1 tablet by mouth once daily Annual appt due in May pt must see provider for future refills 90 tablet 0   vitamin B-12 (CYANOCOBALAMIN) 500 MCG tablet Take 500 mcg by mouth daily.     No current facility-administered medications on file prior to visit.        ROS:  All others reviewed and negative.  Objective        PE:  BP 120/80 (BP Location: Left Arm, Patient Position: Sitting, Cuff Size: Normal)   Pulse (!) 50   Temp 97.7 F (36.5 C) (Oral)   Ht 5\' 7"  (1.702 m)   SpO2 96%   BMI 39.16 kg/m                 Constitutional: Pt appears in NAD               HENT: Head: NCAT.                Right Ear: External ear normal.                 Left Ear: External ear normal.                Eyes: . Pupils are equal, round, and reactive to light. Conjunctivae and EOM are normal               Nose: without  d/c or deformity               Neck: Neck supple. Gross normal ROM  Cardiovascular: Normal rate and regular rhythm.                 Pulmonary/Chest: Effort normal and breath sounds without rales or wheezing.                Abd:  Soft, NT, ND, + BS, no organomegaly               Neurological: Pt is alert. At baseline orientation, motor grossly intact               Skin: Skin is warm. No rashes, no other new lesions, LE edema - none               Psychiatric: Pt behavior is normal without agitation   Micro: none  Cardiac tracings I have personally interpreted today:  none  Pertinent Radiological findings (summarize): none   Lab Results  Component Value Date   WBC 6.1 08/20/2022   HGB 11.1 (L) 08/20/2022   HCT 33.9 (L) 08/20/2022   PLT 346.0 08/20/2022   GLUCOSE 114 (H) 08/20/2022   CHOL 170 08/20/2022   TRIG 99.0 08/20/2022   HDL 48.50 08/20/2022   LDLDIRECT 180.0 01/29/2010   LDLCALC 101 (H) 08/20/2022   ALT 11 08/20/2022   AST 15 08/20/2022   NA 140 08/20/2022   K 3.7 08/20/2022   CL 106 08/20/2022   CREATININE 0.92 08/20/2022   BUN 11 08/20/2022   CO2 26 08/20/2022   TSH 3.19 08/20/2022   INR 1.2 05/13/2020   HGBA1C 6.5 08/20/2022   MICROALBUR <0.7 08/20/2021   Assessment/Plan:  Deanna Wall is a 89 y.o. Black or African American [2] female with  has a past medical history of ANEMIA, PERNICIOUS (01/30/2009), Breast cancer (HCC), Cataracts, bilateral, DEGENERATIVE DISC DISEASE (10/28/2006), ECZEMA (10/28/2006), Gait disorder, Glaucoma, HYPERLIPIDEMIA (02/16/2010), HYPERTENSION (10/28/2006), Hypothyroidism, Impaired glucose tolerance (12/31/2010), MS (multiple sclerosis) (HCC), Multiple sclerosis (HCC) (10/28/2006), Obesity, OSTEOARTHRITIS, KNEES, BILATERAL, SEVERE (06/14/2009), Sebaceous cyst, and VITAMIN B12 DEFICIENCY (06/14/2009).  Encounter for well adult exam with abnormal findings Age and sex appropriate education and counseling updated with regular  exercise and diet Referrals for preventative services - declines dxa Immunizations addressed - for shingrix and tdap at pharmacy Smoking counseling  - none needed Evidence for depression or other mood disorder - none significant Most recent labs reviewed. I have personally reviewed and have noted: 1) the patient's medical and social history 2) The patient's current medications and supplements 3) The patient's height, weight, and BMI have been recorded in the chart   B12 deficiency Lab Results  Component Value Date   VITAMINB12 1,136 (H) 08/20/2022   Stable, cont oral replacement - b12 1000 mcg qd   Hyperlipidemia Lab Results  Component Value Date   LDLCALC 101 (H) 08/20/2022   Uncontrolled, goal ldl < 100, pt to continue current statin zetia 10 mg, declines statin or repatha   Iron deficiency anemia No  recent overt bleeding, for f/u lab  Essential hypertension BP Readings from Last 3 Encounters:  08/20/22 120/80  08/05/22 (!) 186/73  07/29/22 (!) 141/61   Stable, pt to continue medical treatment norvasc 5 qd, clonidine 0.3 bid   Impaired glucose tolerance Lab Results  Component Value Date   HGBA1C 6.5 08/20/2022   Stable, pt to continue current medical treatment  - diet, wt control  Vitamin D deficiency Last vitamin D Lab Results  Component Value Date   VD25OH 70.92 08/20/2022   Stable, cont oral replacement  Followup:  Return in about 6 months (around 02/20/2023).  Oliver Barre, MD 08/22/2022 5:34 PM [facility]

## 2022-08-20 NOTE — Patient Instructions (Addendum)
Please have your Shingrix (shingles) shots done at your local pharmacy, as well as the Tdap tetanus shot  Please continue all other medications as before, and refills have been done if requested.  Please have the pharmacy call with any other refills you may need.  Please continue your efforts at being more active, low cholesterol diet, and weight control.  You are otherwise up to date with prevention measures today.  Please keep your appointments with your specialists as you may have planned - the surgeon soon, and oncology soon after that, and the neurology if needed  Please go to the LAB at the blood drawing area for the tests to be done  You will be contacted by phone if any changes need to be made immediately.  Otherwise, you will receive a letter about your results with an explanation, but please check with MyChart first.  Please remember to sign up for MyChart if you have not done so, as this will be important to you in the future with finding out test results, communicating by private email, and scheduling acute appointments online when needed.  Please make an Appointment to return in 6 months, or sooner if needed

## 2022-08-22 ENCOUNTER — Encounter: Payer: Self-pay | Admitting: Internal Medicine

## 2022-08-22 NOTE — Assessment & Plan Note (Signed)
BP Readings from Last 3 Encounters:  08/20/22 120/80  08/05/22 (!) 186/73  07/29/22 (!) 141/61   Stable, pt to continue medical treatment norvasc 5 qd, clonidine 0.3 bid

## 2022-08-22 NOTE — Assessment & Plan Note (Signed)
Last vitamin D Lab Results  Component Value Date   VD25OH 70.92 08/20/2022   Stable, cont oral replacement

## 2022-08-22 NOTE — Assessment & Plan Note (Signed)
No recent overt bleeding, for f/u lab ?

## 2022-08-22 NOTE — Assessment & Plan Note (Signed)
Lab Results  Component Value Date   VITAMINB12 1,136 (H) 08/20/2022   Stable, cont oral replacement - b12 1000 mcg qd

## 2022-08-22 NOTE — Assessment & Plan Note (Signed)
Lab Results  Component Value Date   HGBA1C 6.5 08/20/2022   Stable, pt to continue current medical treatment  - diet, wt control

## 2022-08-22 NOTE — Assessment & Plan Note (Signed)
Lab Results  Component Value Date   LDLCALC 101 (H) 08/20/2022   Uncontrolled, goal ldl < 100, pt to continue current statin zetia 10 mg, declines statin or repatha

## 2022-08-22 NOTE — Assessment & Plan Note (Signed)
Age and sex appropriate education and counseling updated with regular exercise and diet Referrals for preventative services - declines dxa Immunizations addressed - for shingrix and tdap at pharmacy Smoking counseling  - none needed Evidence for depression or other mood disorder - none significant Most recent labs reviewed. I have personally reviewed and have noted: 1) the patient's medical and social history 2) The patient's current medications and supplements 3) The patient's height, weight, and BMI have been recorded in the chart

## 2022-08-25 NOTE — Assessment & Plan Note (Signed)
pT1cN0M0, stage IA, G2, ER+/PR+/HER2- -this was discovered on screening mammogram -She underwent right breast lumpectomy on Aug 05, 2022.  I reviewed her surgical pathology findings, which showed a 1.2 cm invasive ductal carcinoma, with positive lymphovascular invasion, grade 2, negative margins. -Due to her advanced age and medical comorbidities, Oncotype was not recommended -Due to her advanced age, immobility from MS, she is not a candidate for adjuvant radiation  ---Given the strong ER and PR positivity, I discussed adjuvant aromatase inhibitor to reduce her risk of cancer recurrence,  The potential benefit and side effects, which includes but not limited to, hot flash, skin and vaginal dryness, metabolic changes ( increased blood glucose, cholesterol, weight, etc.), slightly in increased risk of cardiovascular disease, cataracts, muscular and joint discomfort, osteopenia and osteoporosis, etc, were discussed with her in great details.  Patient and her daughters voiced good understanding. -Given her advanced age, and her medical comorbidities, if she has side effects from antiestrogen therapy, I have a low threshold to stop it.  If she decided not to pursue, it is also reasonable.

## 2022-08-26 ENCOUNTER — Inpatient Hospital Stay: Payer: Medicare Other | Attending: Hematology | Admitting: Hematology

## 2022-08-26 ENCOUNTER — Encounter: Payer: Self-pay | Admitting: Hematology

## 2022-08-26 VITALS — BP 176/62 | HR 58 | Temp 97.5°F | Resp 18

## 2022-08-26 DIAGNOSIS — C50411 Malignant neoplasm of upper-outer quadrant of right female breast: Secondary | ICD-10-CM | POA: Insufficient documentation

## 2022-08-26 DIAGNOSIS — Z79811 Long term (current) use of aromatase inhibitors: Secondary | ICD-10-CM | POA: Insufficient documentation

## 2022-08-26 DIAGNOSIS — G35 Multiple sclerosis: Secondary | ICD-10-CM | POA: Diagnosis not present

## 2022-08-26 DIAGNOSIS — Z170 Estrogen receptor positive status [ER+]: Secondary | ICD-10-CM | POA: Insufficient documentation

## 2022-08-26 MED ORDER — EXEMESTANE 25 MG PO TABS: 25.0000 mg | ORAL_TABLET | Freq: Every day | ORAL | 2 refills | Status: DC

## 2022-08-26 NOTE — Progress Notes (Signed)
[facility]   Telephone:([phone] Fax:([phone]) [phone]   Clinic Follow up Note   Patient Care Team: Corwin Levins, MD as PCP - General [facility] as Consulting Physician (Ophthalmology) Donnelly Angelica, RN as Oncology Nurse Navigator Pershing Proud, RN as Oncology Nurse Navigator Malachy Mood, MD as Consulting Physician (Hematology) Antony Blackbird, MD as Consulting Physician (Radiation Oncology) Griselda Miner, MD as Consulting Physician (General Surgery)  Date of Service:  08/26/2022  CHIEF COMPLAINT: f/u of Right  Breast Cancer, ER positive   CURRENT THERAPY:    ASSESSMENT:  Deanna Wall is a 89 y.o. female with   Malignant neoplasm of upper-outer quadrant of right breast in female, estrogen receptor positive (HCC) pT1cN0M0, stage IA, G2, ER+/PR+/HER2- -this was discovered on screening mammogram -She underwent right breast lumpectomy on Aug 05, 2022.  I reviewed her surgical pathology findings, which showed a 1.2 cm invasive ductal carcinoma, with positive lymphovascular invasion, grade 2, negative margins. -Due to her advanced age and medical comorbidities, Oncotype was not recommended -Due to her advanced age, immobility from MS, she is not a candidate for adjuvant radiation  ---Given the strong ER and PR positivity, I discussed adjuvant aromatase inhibitor to reduce her risk of cancer recurrence,  The potential benefit and side effects, which includes but not limited to, hot flash, skin and vaginal dryness, metabolic changes ( increased blood glucose, cholesterol, weight, etc.), slightly in increased risk of cardiovascular disease, cataracts, muscular and joint discomfort, osteopenia and osteoporosis, etc, were discussed with her in great details.  Patient and her daughters voiced good understanding. -Given her advanced age, and her medical comorbidities, if she has side effects from antiestrogen therapy, I have a low threshold to stop it.  If  she decided not to pursue, it is also reasonable. -She has recovered well from her surgery, I called in exemestane for her, she will start in the next 2 to 3 weeks. -due to her immobility from MS, she is not a good candidate for tamoxifen     PLAN: -lab reviewed -I discuss  the different antiestrogen therapy -I prescribed  Exemestane, plan for 5 years -f/u phone visit in 3 months   SUMMARY OF ONCOLOGIC HISTORY: Oncology History Overview Note   Cancer Staging  Malignant neoplasm of upper-outer quadrant of right breast in female, estrogen receptor positive Staging form: Breast, AJCC 8th Edition - Clinical stage from 07/08/2022: Stage IA (cT1c, cN0, cM0, G2, ER+, PR+, HER2-) - Signed by Malachy Mood, MD on 07/14/2022 Stage prefix: Initial diagnosis Histologic grading system: 3 grade system     Malignant neoplasm of upper-outer quadrant of right breast in female, estrogen receptor positive (HCC)  07/08/2022 Cancer Staging   Staging form: Breast, AJCC 8th Edition - Clinical stage from 07/08/2022: Stage IA (cT1c, cN0, cM0, G2, ER+, PR+, HER2-) - Signed by Malachy Mood, MD on 07/14/2022 Stage prefix: Initial diagnosis Histologic grading system: 3 grade system   07/13/2022 Initial Diagnosis   Malignant neoplasm of upper-outer quadrant of right breast in female, estrogen receptor positive   08/05/2022 Cancer Staging   Staging form: Breast, AJCC 8th Edition - Pathologic stage from 08/05/2022: Stage IV (pT1c, pNX, cM1, G2, ER+, PR+, HER2-) - Signed by Malachy Mood, MD on 08/25/2022 Histologic grading system: 3 grade system Residual tumor (R): R0 - None      INTERVAL HISTORY:  Deanna Wall is here for a follow up of Right  Breast Cancer, ER positive. She was  last seen by me on 07/15/2022. She presents to the clinic accompanied by family.Pt state that she slept with out her bra for the first time last and she had some pain.    All other systems were reviewed with the patient and are  negative.  MEDICAL HISTORY:  Past Medical History:  Diagnosis Date   ANEMIA, PERNICIOUS 01/30/2009   Breast cancer (HCC)    Cataracts, bilateral    DEGENERATIVE DISC DISEASE 10/28/2006   ECZEMA 10/28/2006   Gait disorder    Glaucoma    HYPERLIPIDEMIA 02/16/2010   HYPERTENSION 10/28/2006   Hypothyroidism    Impaired glucose tolerance 12/31/2010   MS (multiple sclerosis) (HCC)    Multiple sclerosis (HCC) 10/28/2006   Obesity    OSTEOARTHRITIS, KNEES, BILATERAL, SEVERE 06/14/2009   Sebaceous cyst    chest    VITAMIN B12 DEFICIENCY 06/14/2009    SURGICAL HISTORY: Past Surgical History:  Procedure Laterality Date   BREAST LUMPECTOMY WITH RADIOACTIVE SEED LOCALIZATION Right 08/05/2022   Procedure: RIGHT BREAST LUMPECTOMY WITH RADIOACTIVE SEED LOCALIZATION;  Surgeon: Griselda Miner, MD;  Location: [facility] OR;  Service: General;  Laterality: Right;   COLONOSCOPY     ORIF FEMUR FRACTURE Right 05/13/2020   Procedure: OPEN REDUCTION INTERNAL FIXATION (ORIF) DISTAL FEMUR FRACTURE;  Surgeon: Juanell Fairly, MD;  Location: [facility] ORS;  Service: Orthopedics;  Laterality: Right;   TUBAL LIGATION  1958   UPPER GASTROINTESTINAL ENDOSCOPY      I have reviewed the social history and family history with the patient and they are unchanged from previous note.  ALLERGIES:  is allergic to atorvastatin.  MEDICATIONS:  Current Outpatient Medications  Medication Sig Dispense Refill   exemestane (AROMASIN) 25 MG tablet Take 1 tablet (25 mg total) by mouth daily after breakfast. 30 tablet 2   amLODipine (NORVASC) 5 MG tablet Take 1 tablet by mouth once daily 90 tablet 2   aspirin 81 MG tablet Take 81 mg by mouth daily.     cholecalciferol (VITAMIN D3) 25 MCG (1000 UNIT) tablet Take 2,000 Units by mouth daily.     citalopram (CELEXA) 10 MG tablet Take 1 tablet by mouth once daily 90 tablet 2   cloNIDine (CATAPRES) 0.3 MG tablet Take 1 tablet by mouth twice daily (Patient taking differently: Take 0.3 mg by  mouth 2 (two) times daily.) 180 tablet 3   diclofenac sodium (VOLTAREN) 1 % GEL Apply 2 g topically 4 (four) times daily as needed. 200 g 5   dorzolamide (TRUSOPT) 2 % ophthalmic solution 1 drop 2 (two) times daily.     ezetimibe (ZETIA) 10 MG tablet Take 1 tablet by mouth once daily 90 tablet 0   furosemide (LASIX) 20 MG tablet TAKE 1 TABLET BY MOUTH ONCE DAILY AS NEEDED FOR  SWELLING (Patient taking differently: Take 20 mg by mouth daily.) 90 tablet 2   latanoprost (XALATAN) 0.005 % ophthalmic solution Place 1 drop into both eyes at bedtime.  3   levothyroxine (SYNTHROID) 25 MCG tablet Take 1 tablet by mouth once daily 90 tablet 0   nitrofurantoin (MACRODANTIN) 50 MG capsule Take 1 capsule by mouth once daily 30 capsule 5   oxyCODONE (ROXICODONE) 5 MG immediate release tablet Take 1 tablet (5 mg total) by mouth every 6 (six) hours as needed for severe pain. 10 tablet 0   polyethylene glycol powder (GLYCOLAX/MIRALAX) 17 GM/SCOOP powder Take 17 g by mouth 2 (two) times daily as needed. 3350 g 1   potassium chloride (KLOR-CON) 10  MEQ tablet Take 1 tablet (10 mEq total) by mouth daily. Take 1 tablet by mouth once daily Annual appt due in May pt must see provider for future refills 90 tablet 0   vitamin B-12 (CYANOCOBALAMIN) 500 MCG tablet Take 500 mcg by mouth daily.     No current facility-administered medications for this visit.    PHYSICAL EXAMINATION: ECOG PERFORMANCE STATUS: 2 - Symptomatic, <50% confined to bed  Vitals:   08/26/22 1337  BP: (!) 176/62  Pulse: (!) 58  Resp: 18  Temp: (!) 97.5 F (36.4 C)  SpO2: 97%   Wt Readings from Last 3 Encounters:  08/05/22 250 lb (113.4 kg)  07/29/22 250 lb (113.4 kg)  05/13/20 203 lb 11.3 oz (92.4 kg)     GENERAL:alert, no distress and comfortable SKIN: skin color normal, no rashes or significant lesions EYES: normal, Conjunctiva are pink and non-injected, sclera clear  NEURO: alert & oriented x 3 with fluent speech   LABORATORY  DATA:  I have reviewed the data as listed    Latest Ref Rng & Units 08/20/2022   11:10 AM 07/15/2022   12:24 PM 08/20/2021   11:46 AM  CBC  WBC 4.0 - 10.5 K/uL 6.1  7.0  7.2   Hemoglobin 12.0 - 15.0 g/dL 16.1  09.6  04.5   Hematocrit 36.0 - 46.0 % 33.9  34.3  35.2   Platelets 150.0 - 400.0 K/uL 346.0  318  307.0         Latest Ref Rng & Units 08/20/2022   11:10 AM 07/15/2022   12:24 PM 02/18/2022   11:09 AM  CMP  Glucose 70 - 99 mg/dL 409  811    BUN 6 - 23 mg/dL 11  15    Creatinine 9.14 - 1.20 mg/dL 7.82  9.56    Sodium 213 - 145 mEq/L 140  140    Potassium 3.5 - 5.1 mEq/L 3.7  3.8    Chloride 96 - 112 mEq/L 106  107    CO2 19 - 32 mEq/L 26  28    Calcium 8.4 - 10.5 mg/dL 8.7  9.2    Total Protein 6.0 - 8.3 g/dL 7.1  7.4  7.3   Total Bilirubin 0.2 - 1.2 mg/dL 0.5  0.7  0.6   Alkaline Phos 39 - 117 U/L 82  81  82   AST 0 - 37 U/L 15  16  17    ALT 0 - 35 U/L 11  13  12        RADIOGRAPHIC STUDIES: I have personally reviewed the radiological images as listed and agreed with the findings in the report. No results found.    No orders of the defined types were placed in this encounter.  All questions were answered. The patient knows to call the clinic with any problems, questions or concerns. No barriers to learning was detected. The total time spent in the appointment was 30 minutes.     Malachy Mood, MD 08/26/2022   Carolin Coy, CMA, am acting as scribe for Malachy Mood, MD.   I have reviewed the above documentation for accuracy and completeness, and I agree with the above.

## 2022-09-05 ENCOUNTER — Other Ambulatory Visit (HOSPITAL_BASED_OUTPATIENT_CLINIC_OR_DEPARTMENT_OTHER): Payer: Self-pay

## 2022-09-05 ENCOUNTER — Encounter (HOSPITAL_BASED_OUTPATIENT_CLINIC_OR_DEPARTMENT_OTHER): Payer: Self-pay | Admitting: Emergency Medicine

## 2022-09-05 ENCOUNTER — Emergency Department (HOSPITAL_BASED_OUTPATIENT_CLINIC_OR_DEPARTMENT_OTHER): Payer: Medicare Other

## 2022-09-05 ENCOUNTER — Ambulatory Visit
Admission: EM | Admit: 2022-09-05 | Discharge: 2022-09-05 | Disposition: A | Discharge status: Home / Self Care | Payer: Medicare Other | Attending: Internal Medicine | Admitting: Internal Medicine

## 2022-09-05 ENCOUNTER — Emergency Department (HOSPITAL_BASED_OUTPATIENT_CLINIC_OR_DEPARTMENT_OTHER)
Admission: EM | Admit: 2022-09-05 | Discharge: 2022-09-05 | Disposition: A | Discharge status: Home / Self Care | Payer: Medicare Other | Attending: Emergency Medicine | Admitting: Emergency Medicine

## 2022-09-05 ENCOUNTER — Other Ambulatory Visit: Payer: Self-pay

## 2022-09-05 DIAGNOSIS — R7981 Abnormal blood-gas level: Secondary | ICD-10-CM

## 2022-09-05 DIAGNOSIS — R053 Chronic cough: Secondary | ICD-10-CM | POA: Diagnosis not present

## 2022-09-05 DIAGNOSIS — R0602 Shortness of breath: Secondary | ICD-10-CM | POA: Diagnosis not present

## 2022-09-05 DIAGNOSIS — R062 Wheezing: Secondary | ICD-10-CM | POA: Insufficient documentation

## 2022-09-05 DIAGNOSIS — J40 Bronchitis, not specified as acute or chronic: Secondary | ICD-10-CM | POA: Diagnosis not present

## 2022-09-05 DIAGNOSIS — Z1152 Encounter for screening for COVID-19: Secondary | ICD-10-CM | POA: Diagnosis not present

## 2022-09-05 DIAGNOSIS — R059 Cough, unspecified: Secondary | ICD-10-CM | POA: Diagnosis not present

## 2022-09-05 LAB — CBC WITH DIFFERENTIAL/PLATELET
Abs Immature Granulocytes: 0.03 10*3/uL (ref 0.00–0.07)
Basophils Absolute: 0.1 10*3/uL (ref 0.0–0.1)
Basophils Relative: 1 %
Eosinophils Absolute: 0.1 10*3/uL (ref 0.0–0.5)
Eosinophils Relative: 1 %
HCT: 36.4 % (ref 36.0–46.0)
Hemoglobin: 11.9 g/dL — ABNORMAL LOW (ref 12.0–15.0)
Immature Granulocytes: 0 %
Lymphocytes Relative: 21 %
Lymphs Abs: 1.9 10*3/uL (ref 0.7–4.0)
MCH: 27.4 pg (ref 26.0–34.0)
MCHC: 32.7 g/dL (ref 30.0–36.0)
MCV: 83.9 fL (ref 80.0–100.0)
Monocytes Absolute: 0.8 10*3/uL (ref 0.1–1.0)
Monocytes Relative: 9 %
Neutro Abs: 6 10*3/uL (ref 1.7–7.7)
Neutrophils Relative %: 68 %
Platelets: 359 10*3/uL (ref 150–400)
RBC: 4.34 MIL/uL (ref 3.87–5.11)
RDW: 14.2 % (ref 11.5–15.5)
WBC: 8.9 10*3/uL (ref 4.0–10.5)
nRBC: 0 % (ref 0.0–0.2)

## 2022-09-05 LAB — RESP PANEL BY RT-PCR (RSV, FLU A&B, COVID)  RVPGX2
Influenza A by PCR: NEGATIVE
Influenza B by PCR: NEGATIVE
Resp Syncytial Virus by PCR: NEGATIVE
SARS Coronavirus 2 by RT PCR: NEGATIVE

## 2022-09-05 LAB — BASIC METABOLIC PANEL
Anion gap: 8 (ref 5–15)
BUN: 11 mg/dL (ref 8–23)
CO2: 27 mmol/L (ref 22–32)
Calcium: 9.2 mg/dL (ref 8.9–10.3)
Chloride: 106 mmol/L (ref 98–111)
Creatinine, Ser: 0.88 mg/dL (ref 0.44–1.00)
GFR, Estimated: >60 mL/min (ref >60)
Glucose, Bld: 115 mg/dL — ABNORMAL HIGH (ref 70–99)
Potassium: 3.5 mmol/L (ref 3.5–5.1)
Sodium: 141 mmol/L (ref 135–145)

## 2022-09-05 MED ORDER — ALBUTEROL SULFATE (2.5 MG/3ML) 0.083% IN NEBU
2.5000 mg | INHALATION_SOLUTION | Freq: Once | RESPIRATORY_TRACT | Status: AC
  Administered 2022-09-05: 2.5 mg via RESPIRATORY_TRACT

## 2022-09-05 MED ORDER — ALBUTEROL SULFATE (2.5 MG/3ML) 0.083% IN NEBU
INHALATION_SOLUTION | RESPIRATORY_TRACT | Status: AC
  Administered 2022-09-05: 10 mg/h via RESPIRATORY_TRACT
  Filled 2022-09-05: qty 12

## 2022-09-05 MED ORDER — PREDNISONE 50 MG PO TABS
ORAL_TABLET | ORAL | 0 refills | Status: DC
  Filled 2022-09-05: qty 5, 5d supply, fill #0

## 2022-09-05 MED ORDER — ALBUTEROL (5 MG/ML) CONTINUOUS INHALATION SOLN: 10.0000 mg/h | INHALATION_SOLUTION | Freq: Once | RESPIRATORY_TRACT | Status: DC

## 2022-09-05 MED ORDER — AEROCHAMBER PLUS FLO-VU MISC
1.0000 | Freq: Once | Status: AC
  Administered 2022-09-05: 1
  Filled 2022-09-05: qty 1

## 2022-09-05 MED ORDER — ALBUTEROL SULFATE (2.5 MG/3ML) 0.083% IN NEBU: 10.0000 mg/h | INHALATION_SOLUTION | Freq: Once | RESPIRATORY_TRACT | Status: AC

## 2022-09-05 MED ORDER — METHYLPREDNISOLONE SODIUM SUCC 125 MG IJ SOLR
125.0000 mg | INTRAMUSCULAR | Status: AC
  Administered 2022-09-05: 125 mg via INTRAVENOUS
  Filled 2022-09-05: qty 2

## 2022-09-05 MED ORDER — ALBUTEROL SULFATE HFA 108 (90 BASE) MCG/ACT IN AERS
2.0000 | INHALATION_SPRAY | RESPIRATORY_TRACT | Status: DC
  Administered 2022-09-05: 2 via RESPIRATORY_TRACT
  Filled 2022-09-05: qty 6.7

## 2022-09-05 MED ORDER — SODIUM CHLORIDE 0.9 % IV SOLN: INTRAVENOUS | Status: DC

## 2022-09-05 NOTE — ED Triage Notes (Signed)
Congestion, runny nose, cough for about a week a half. No fevers. Seen at urgent care and was told her oxygen was low and sent to Ed to rule out pneumonia.

## 2022-09-05 NOTE — ED Provider Notes (Signed)
[facility]    CSN: [id number] Arrival date & time: 09/05/22  1011      History   Chief Complaint Chief Complaint  Patient presents with   Cough    HPI Deanna Wall is a 89 y.o. female.   Patient presents with her daughter today who helps provide history.  Patient reports that she has had a productive cough with white sputum for the past 2 weeks with associated runny nose.  States that she woke up this morning and was wheezing but denies shortness of breath or chest pain.  Denies any history of asthma or COPD and patient has never smoked cigarettes before.  She has not taken over-the-counter cough medication with minimal improvement in symptoms.   Cough   Past Medical History:  Diagnosis Date   ANEMIA, PERNICIOUS 01/30/2009   Breast cancer (HCC)    Cataracts, bilateral    DEGENERATIVE DISC DISEASE 10/28/2006   ECZEMA 10/28/2006   Gait disorder    Glaucoma    HYPERLIPIDEMIA 02/16/2010   HYPERTENSION 10/28/2006   Hypothyroidism    Impaired glucose tolerance 12/31/2010   MS (multiple sclerosis) (HCC)    Multiple sclerosis (HCC) 10/28/2006   Obesity    OSTEOARTHRITIS, KNEES, BILATERAL, SEVERE 06/14/2009   Sebaceous cyst    chest    VITAMIN B12 DEFICIENCY 06/14/2009    Patient Active Problem List   Diagnosis Date Noted   Malignant neoplasm of upper-outer quadrant of right breast in female, estrogen receptor positive (HCC) 07/13/2022   Hypothyroidism 02/18/2022   Bilateral shoulder pain 02/18/2022   Closed fracture of right distal femur (HCC) 05/12/2020   Vitamin D deficiency 11/08/2019   Arthritis of hand, degenerative 11/02/2018   Anxiety 11/04/2017   Bradycardia 10/21/2017   Ingrown toenail 10/13/2017   Cough 04/01/2017   Nausea & vomiting 10/10/2016   Vertigo 10/10/2016   Constipation 05/13/2016   Irregular heart beat 11/13/2015   Insect bite, infected 09/11/2015   Cerumen impaction 09/11/2014   Allergic rhinitis 06/05/2014   Nausea  without vomiting 06/05/2014   Paresthesias 11/08/2012   Left facial pain 12/07/2011   Recurrent UTI 11/24/2011   Dysuria 05/25/2011   Impaired glucose tolerance 12/31/2010   Encounter for well adult exam with abnormal findings 07/01/2010   Hyperlipidemia 02/16/2010   B12 deficiency 06/14/2009   Iron deficiency anemia 06/14/2009   OSTEOARTHRITIS, KNEES, BILATERAL, SEVERE 06/14/2009   LUMBAR RADICULOPATHY, RIGHT 06/14/2009   Abnormality of gait 06/14/2009   ANEMIA, PERNICIOUS 01/30/2009   Multiple sclerosis (HCC) 10/28/2006   Essential hypertension 10/28/2006   ECZEMA 10/28/2006   DEGENERATIVE DISC DISEASE 10/28/2006    Past Surgical History:  Procedure Laterality Date   BREAST LUMPECTOMY WITH RADIOACTIVE SEED LOCALIZATION Right 08/05/2022   Procedure: RIGHT BREAST LUMPECTOMY WITH RADIOACTIVE SEED LOCALIZATION;  Surgeon: Griselda Miner, MD;  Location: [facility];  Service: General;  Laterality: Right;   COLONOSCOPY     ORIF FEMUR FRACTURE Right 05/13/2020   Procedure: OPEN REDUCTION INTERNAL FIXATION (ORIF) DISTAL FEMUR FRACTURE;  Surgeon: Juanell Fairly, MD;  Location: [facility] ORS;  Service: Orthopedics;  Laterality: Right;   TUBAL LIGATION  1958   UPPER GASTROINTESTINAL ENDOSCOPY      OB History   No obstetric history on file.      Home Medications    Prior to Admission medications   Medication Sig Start Date End Date Taking? Authorizing Provider  amLODipine (NORVASC) 5 MG tablet Take 1 tablet by mouth once daily 05/26/22   Oliver Barre  W, MD  aspirin 81 MG tablet Take 81 mg by mouth daily.    [provider]  cholecalciferol (VITAMIN D3) 25 MCG (1000 UNIT) tablet Take 2,000 Units by mouth daily.    [provider]  citalopram (CELEXA) 10 MG tablet Take 1 tablet by mouth once daily 05/26/22   Corwin Levins, MD  cloNIDine (CATAPRES) 0.3 MG tablet Take 1 tablet by mouth twice daily Patient taking differently: Take 0.3 mg by mouth 2 (two) times daily. 02/25/22   Corwin Levins, MD  diclofenac sodium (VOLTAREN) 1 % GEL Apply 2 g topically 4 (four) times daily as needed. 11/02/18   Corwin Levins, MD  dorzolamide (TRUSOPT) 2 % ophthalmic solution 1 drop 2 (two) times daily. 07/11/21   [provider]  exemestane (AROMASIN) 25 MG tablet Take 1 tablet (25 mg total) by mouth daily after breakfast. 08/26/22   Malachy Mood, MD  ezetimibe (ZETIA) 10 MG tablet Take 1 tablet by mouth once daily 08/18/22   Corwin Levins, MD  furosemide (LASIX) 20 MG tablet TAKE 1 TABLET BY MOUTH ONCE DAILY AS NEEDED FOR  SWELLING Patient taking differently: Take 20 mg by mouth daily. 12/02/21   Corwin Levins, MD  latanoprost (XALATAN) 0.005 % ophthalmic solution Place 1 drop into both eyes at bedtime. 11/08/14   [provider]  levothyroxine (SYNTHROID) 25 MCG tablet Take 1 tablet by mouth once daily 08/18/22   Corwin Levins, MD  nitrofurantoin (MACRODANTIN) 50 MG capsule Take 1 capsule by mouth once daily 02/25/22   Corwin Levins, MD  oxyCODONE (ROXICODONE) 5 MG immediate release tablet Take 1 tablet (5 mg total) by mouth every 6 (six) hours as needed for severe pain. 08/05/22   Chevis Pretty III, MD  polyethylene glycol powder [facility]) 17 GM/SCOOP powder Take 17 g by mouth 2 (two) times daily as needed. 11/10/18   Corwin Levins, MD  potassium chloride (KLOR-CON) 10 MEQ tablet Take 1 tablet (10 mEq total) by mouth daily. Take 1 tablet by mouth once daily Annual appt due in May pt must see provider for future refills 06/29/22   Corwin Levins, MD  vitamin B-12 (CYANOCOBALAMIN) 500 MCG tablet Take 500 mcg by mouth daily.    [provider]    Family History Family History  Problem Relation Age of Onset   Bone cancer Mother    Arthritis Mother    Diabetes Sister    Arthritis Sister    Stomach cancer Maternal Aunt    Prostate cancer Cousin        maternal first cousin    Social History Social History   Tobacco Use   Smoking status: Never   Smokeless tobacco:  Never  Vaping Use   Vaping Use: Never used  Substance Use Topics   Alcohol use: No   Drug use: No     Allergies   Atorvastatin   Review of Systems Review of Systems Per HPI  Physical Exam Triage Vital Signs ED Triage Vitals  Enc Vitals Group     BP 09/05/22 1023 (!) 156/72     Pulse Rate 09/05/22 1023 68     Resp 09/05/22 1023 18     Temp 09/05/22 1023 98.6 F (37 C)     Temp Source 09/05/22 1023 Oral     SpO2 09/05/22 1023 93 %     Weight --      Height --      Head Circumference --  Peak Flow --      Pain Score 09/05/22 1022 0     Pain Loc --      Pain Edu? --      Excl. in GC? --    No data found.  Updated Vital Signs BP (!) 156/72 (BP Location: Left Arm)   Pulse 68   Temp 98.6 F (37 C) (Oral)   Resp 18   SpO2 93%   Visual Acuity Right Eye Distance:   Left Eye Distance:   Bilateral Distance:    Right Eye Near:   Left Eye Near:    Bilateral Near:     Physical Exam Constitutional:      General: She is not in acute distress.    Appearance: Normal appearance. She is not toxic-appearing or diaphoretic.  HENT:     Head: Normocephalic and atraumatic.     Ears:     Comments: Deferred given oxygen saturation.    Nose:     Comments: Deferred given oxygen saturation.    Mouth/Throat:     Comments: Deferred given oxygen saturation. Eyes:     Extraocular Movements: Extraocular movements intact.     Conjunctiva/sclera: Conjunctivae normal.  Cardiovascular:     Rate and Rhythm: Normal rate and regular rhythm.     Pulses: Normal pulses.     Heart sounds: Normal heart sounds.  Pulmonary:     Effort: Pulmonary effort is normal. No respiratory distress.     Breath sounds: No stridor. Wheezing and rhonchi present. No rales.     Comments: Wheezing and rhonchi noted bilaterally to auscultation. Chest:     Chest wall: No tenderness.  Neurological:     General: No focal deficit present.     Mental Status: She is alert and oriented to person, place,  and time. Mental status is at baseline.  Psychiatric:        Mood and Affect: Mood normal.        Behavior: Behavior normal.        Thought Content: Thought content normal.        Judgment: Judgment normal.      UC Treatments / Results  Labs (all labs ordered are listed, but only abnormal results are displayed) Labs Reviewed - No data to display  EKG   Radiology No results found.  Procedures Procedures (including critical care time)  Medications Ordered in UC Medications  albuterol (PROVENTIL) (2.5 MG/3ML) 0.083% nebulizer solution 2.5 mg (2.5 mg Nebulization Given 09/05/22 1034)    Initial Impression / Assessment and Plan / UC Course  I have reviewed the triage vital signs and the nursing notes.  Pertinent labs & imaging results that were available during my care of the patient were reviewed by me and considered in my medical decision making (see chart for details).     Patient's oxygen saturation was ranging from 91 to 93% during initial triage and physical exam.  Patient had wheezing and rhonchi noted to auscultation as well.  Albuterol nebulizer solution was administered with no improvement in oxygen.  Therefore, given patient's age and oxygen saturation she was recommended to go to the ER for further evaluation and management.  She was agreeable with this plan but she declined EMS transport.  Patient wished for her daughter to transport her so advised her to go straight to the ER for further evaluation and management. Final Clinical Impressions(s) / UC Diagnoses   Final diagnoses:  Wheezing  Persistent cough  Low oxygen saturation  Discharge Instructions   None    ED Prescriptions   None    PDMP not reviewed this encounter.   Gustavus Bryant, [place] 09/05/22 1106

## 2022-09-05 NOTE — ED Notes (Signed)
Pt and family received AVS; Educated on new prescriptions and s/s on when to return to the ED; patient and family verbalized understanding and had no further question upon discharge.

## 2022-09-05 NOTE — ED Provider Notes (Signed)
[place] EMERGENCY DEPARTMENT AT [facility] Provider Note   CSN: [id number] Arrival date & time: 09/05/22  1138     History  Chief Complaint  Patient presents with   Cough    Deanna Wall is a 89 y.o. female.  89 year old female presents with over 1 week history of URI symptoms.  She has had cough congestion and wheezing.  Denies any vomiting or diarrhea.  No fever.  To urgent care today and was found to be wheezing and sent for some hypoxemia.  Given albuterol with minimal relief.  No x-ray of her chest at that time.  Concern for possible pneumonia and sent here for evaluation       Home Medications Prior to Admission medications   Medication Sig Start Date End Date Taking? Authorizing Provider  amLODipine (NORVASC) 5 MG tablet Take 1 tablet by mouth once daily 05/26/22   Corwin Levins, MD  aspirin 81 MG tablet Take 81 mg by mouth daily.    [provider]  cholecalciferol (VITAMIN D3) 25 MCG (1000 UNIT) tablet Take 2,000 Units by mouth daily.    [provider]  citalopram (CELEXA) 10 MG tablet Take 1 tablet by mouth once daily 05/26/22   Corwin Levins, MD  cloNIDine (CATAPRES) 0.3 MG tablet Take 1 tablet by mouth twice daily Patient taking differently: Take 0.3 mg by mouth 2 (two) times daily. 02/25/22   Corwin Levins, MD  diclofenac sodium (VOLTAREN) 1 % GEL Apply 2 g topically 4 (four) times daily as needed. 11/02/18   Corwin Levins, MD  dorzolamide (TRUSOPT) 2 % ophthalmic solution 1 drop 2 (two) times daily. 07/11/21   [provider]  exemestane (AROMASIN) 25 MG tablet Take 1 tablet (25 mg total) by mouth daily after breakfast. 08/26/22   Malachy Mood, MD  ezetimibe (ZETIA) 10 MG tablet Take 1 tablet by mouth once daily 08/18/22   Corwin Levins, MD  furosemide (LASIX) 20 MG tablet TAKE 1 TABLET BY MOUTH ONCE DAILY AS NEEDED FOR  SWELLING Patient taking differently: Take 20 mg by mouth daily. 12/02/21   Corwin Levins, MD  latanoprost (XALATAN)  0.005 % ophthalmic solution Place 1 drop into both eyes at bedtime. 11/08/14   [provider]  levothyroxine (SYNTHROID) 25 MCG tablet Take 1 tablet by mouth once daily 08/18/22   Corwin Levins, MD  nitrofurantoin (MACRODANTIN) 50 MG capsule Take 1 capsule by mouth once daily 02/25/22   Corwin Levins, MD  oxyCODONE (ROXICODONE) 5 MG immediate release tablet Take 1 tablet (5 mg total) by mouth every 6 (six) hours as needed for severe pain. 08/05/22   Chevis Pretty III, MD  polyethylene glycol powder [facility]) 17 GM/SCOOP powder Take 17 g by mouth 2 (two) times daily as needed. 11/10/18   Corwin Levins, MD  potassium chloride (KLOR-CON) 10 MEQ tablet Take 1 tablet (10 mEq total) by mouth daily. Take 1 tablet by mouth once daily Annual appt due in May pt must see provider for future refills 06/29/22   Corwin Levins, MD  vitamin B-12 (CYANOCOBALAMIN) 500 MCG tablet Take 500 mcg by mouth daily.    [provider]      Allergies    Atorvastatin    Review of Systems   Review of Systems  All other systems reviewed and are negative.   Physical Exam Updated Vital Signs BP (!) 157/63   Pulse 64   Temp 98 F (36.7 C) (  Oral)   Resp 20   SpO2 98%  Physical Exam Vitals and nursing note reviewed.  Constitutional:      General: She is not in acute distress.    Appearance: Normal appearance. She is well-developed. She is not toxic-appearing.  HENT:     Head: Normocephalic and atraumatic.  Eyes:     General: Lids are normal.     Conjunctiva/sclera: Conjunctivae normal.     Pupils: Pupils are equal, round, and reactive to light.  Neck:     Thyroid: No thyroid mass.     Trachea: No tracheal deviation.  Cardiovascular:     Rate and Rhythm: Normal rate and regular rhythm.     Heart sounds: Normal heart sounds. No murmur heard.    No gallop.  Pulmonary:     Effort: Pulmonary effort is normal. No respiratory distress.     Breath sounds: No stridor. Examination of the right-upper  field reveals decreased breath sounds and wheezing. Examination of the left-upper field reveals decreased breath sounds and wheezing. Decreased breath sounds and wheezing present. No rhonchi or rales.  Abdominal:     General: There is no distension.     Palpations: Abdomen is soft.     Tenderness: There is no abdominal tenderness. There is no rebound.  Musculoskeletal:        General: No tenderness. Normal range of motion.     Cervical back: Normal range of motion and neck supple.  Skin:    General: Skin is warm and dry.     Findings: No abrasion or rash.  Neurological:     Mental Status: She is alert and oriented to person, place, and time. Mental status is at baseline.     GCS: GCS eye subscore is 4. GCS verbal subscore is 5. GCS motor subscore is 6.     Cranial Nerves: No cranial nerve deficit.     Sensory: No sensory deficit.     Motor: Motor function is intact.  Psychiatric:        Attention and Perception: Attention normal.        Speech: Speech normal.        Behavior: Behavior normal.     ED Results / Procedures / Treatments   Labs (all labs ordered are listed, but only abnormal results are displayed) Labs Reviewed  CBC WITH DIFFERENTIAL/PLATELET  BASIC METABOLIC PANEL    EKG None  Radiology No results found.  Procedures Procedures    Medications Ordered in ED Medications  0.9 %  sodium chloride infusion (has no administration in time range)  methylPREDNISolone sodium succinate (SOLU-MEDROL) 125 mg/2 mL injection 125 mg (has no administration in time range)  albuterol (PROVENTIL,VENTOLIN) solution continuous neb (has no administration in time range)    ED Course/ Medical Decision Making/ A&P                             Medical Decision Making Amount and/or Complexity of Data Reviewed Labs: ordered. Radiology: ordered.  Risk Prescription drug management.  Patient's COVID and flu test negative here.  RSV negative as well to.  Labs are reassuring .  patient's chest x-ray per interpretation shows no acute findings.  She had wheezing here and was given albuterol along with Solu-Medrol.  She feels much better at this time.  Suspect bronchitis.  Will place on prednisone and albuterol and discharged home        Final Clinical Impression(s) / ED  Diagnoses Final diagnoses:  None    Rx / DC Orders ED Discharge Orders     None         Lorre Nick, MD 09/05/22 1347

## 2022-09-05 NOTE — ED Triage Notes (Signed)
Pt states cough for the past 2 weeks states this morning she woke up and was wheezing. States she took otc cough medicine at home last night but that it didn't help with her cough.

## 2022-09-05 NOTE — ED Notes (Signed)
Patient is being discharged from the Urgent Care and sent to the Emergency Department via private vehicle . Per Laren Everts NP, patient is in need of higher level of care due to wheezing low sats. Patient is aware and verbalizes understanding of plan of care.  Vitals:   09/05/22 1023  BP: (!) 156/72  Pulse: 68  Resp: 18  Temp: 98.6 F (37 C)  SpO2: 93%

## 2022-09-05 NOTE — ED Notes (Signed)
Pt updated on POC and treatments; resting with family at bedside, and call light in reach.

## 2022-09-10 ENCOUNTER — Telehealth: Payer: Self-pay | Admitting: *Deleted

## 2022-09-10 NOTE — Telephone Encounter (Signed)
Transition Care Management Unsuccessful Follow-up Telephone Call  Date of discharge and from where:  [facility] 09/05/2022  Attempts:  1st Attempt  Reason for unsuccessful TCM follow-up call:  Left voice message

## 2022-09-10 NOTE — Telephone Encounter (Signed)
Transition Care Management Unsuccessful Follow-up Telephone Call  Date of discharge and from where:  [facility] 09/05/2022  Attempts:  2nd Attempt  Reason for unsuccessful TCM follow-up call: Busy

## 2022-09-21 ENCOUNTER — Other Ambulatory Visit: Payer: Self-pay | Admitting: Internal Medicine

## 2022-10-15 ENCOUNTER — Ambulatory Visit (INDEPENDENT_AMBULATORY_CARE_PROVIDER_SITE_OTHER): Payer: Medicare Other

## 2022-10-15 VITALS — Ht 67.0 in

## 2022-10-15 DIAGNOSIS — Z0000 Encounter for general adult medical examination without abnormal findings: Secondary | ICD-10-CM | POA: Diagnosis not present

## 2022-10-15 NOTE — Progress Notes (Signed)
Subjective:   Deanna Wall is a 89 y.o. female who presents for Medicare Annual (Subsequent) preventive examination.  Visit Complete: Virtual  I connected with  [name] on 10/15/22 by a audio enabled telemedicine application and verified that I am speaking with the correct person using two identifiers.  Patient Location: Home  Provider Location: Office/Clinic  I discussed the limitations of evaluation and management by telemedicine. The patient expressed understanding and agreed to proceed.  Per patient no change in vitals since last visit; unable to obtain new vitals due to this being a telehealth visit.  Review of Systems     Cardiac Risk Factors include: advanced age (>81men, >61 women);dyslipidemia;hypertension;sedentary lifestyle     Objective:    Today's Vitals   10/15/22 0847  Height: 5\' 7"  (1.702 m)  PainSc: 0-No pain   Body mass index is 39.16 kg/m.     10/15/2022    8:50 AM 09/05/2022   11:48 AM 07/29/2022   10:24 AM 10/10/2021    2:06 PM 10/02/2020   12:56 PM 05/13/2020    1:52 AM 05/12/2020    6:00 PM  Advanced Directives  Does Patient Have a Medical Advance Directive? Yes No No No Yes Yes No  Type of Estate agent of [place];Living will  Living will  Living will Living will   Does patient want to make changes to medical advance directive?   No - Patient declined  No - Patient declined No - Patient declined   Copy of Healthcare Power of Attorney in Chart? No - copy requested        Would patient like information on creating a medical advance directive?  No - Patient declined No - Patient declined No - Patient declined       Current Medications (verified) Outpatient Encounter Medications as of 10/15/2022  Medication Sig   amLODipine (NORVASC) 5 MG tablet Take 1 tablet by mouth once daily   aspirin 81 MG tablet Take 81 mg by mouth daily.   cholecalciferol (VITAMIN D3) 25 MCG (1000 UNIT) tablet Take 2,000 Units by mouth daily.    citalopram (CELEXA) 10 MG tablet Take 1 tablet by mouth once daily   cloNIDine (CATAPRES) 0.3 MG tablet Take 1 tablet by mouth twice daily (Patient taking differently: Take 0.3 mg by mouth 2 (two) times daily.)   diclofenac sodium (VOLTAREN) 1 % GEL Apply 2 g topically 4 (four) times daily as needed.   dorzolamide (TRUSOPT) 2 % ophthalmic solution 1 drop 2 (two) times daily.   exemestane (AROMASIN) 25 MG tablet Take 1 tablet (25 mg total) by mouth daily after breakfast.   ezetimibe (ZETIA) 10 MG tablet Take 1 tablet by mouth once daily   furosemide (LASIX) 20 MG tablet TAKE 1 TABLET BY MOUTH ONCE DAILY AS NEEDED FOR  SWELLING (Patient taking differently: Take 20 mg by mouth daily.)   latanoprost (XALATAN) 0.005 % ophthalmic solution Place 1 drop into both eyes at bedtime.   levothyroxine (SYNTHROID) 25 MCG tablet Take 1 tablet by mouth once daily   nitrofurantoin (MACRODANTIN) 50 MG capsule Take 1 capsule by mouth once daily   oxyCODONE (ROXICODONE) 5 MG immediate release tablet Take 1 tablet (5 mg total) by mouth every 6 (six) hours as needed for severe pain.   polyethylene glycol powder (GLYCOLAX/MIRALAX) 17 GM/SCOOP powder Take 17 g by mouth 2 (two) times daily as needed.   potassium chloride (KLOR-CON) 10 MEQ tablet Take 1 tablet (10 mEq total) by  mouth daily.   predniSONE (DELTASONE) 50 MG tablet Take one tablet by mouth once daily for 5 days.   vitamin B-12 (CYANOCOBALAMIN) 500 MCG tablet Take 500 mcg by mouth daily.   No facility-administered encounter medications on file as of 10/15/2022.    Allergies (verified) Atorvastatin   History: Past Medical History:  Diagnosis Date   ANEMIA, PERNICIOUS 01/30/2009   Breast cancer (HCC)    Cataracts, bilateral    DEGENERATIVE DISC DISEASE 10/28/2006   ECZEMA 10/28/2006   Gait disorder    Glaucoma    HYPERLIPIDEMIA 02/16/2010   HYPERTENSION 10/28/2006   Hypothyroidism    Impaired glucose tolerance 12/31/2010   MS (multiple sclerosis)  (HCC)    Multiple sclerosis (HCC) 10/28/2006   Obesity    OSTEOARTHRITIS, KNEES, BILATERAL, SEVERE 06/14/2009   Sebaceous cyst    chest    VITAMIN B12 DEFICIENCY 06/14/2009   Past Surgical History:  Procedure Laterality Date   BREAST LUMPECTOMY WITH RADIOACTIVE SEED LOCALIZATION Right 08/05/2022   Procedure: RIGHT BREAST LUMPECTOMY WITH RADIOACTIVE SEED LOCALIZATION;  Surgeon: Griselda Miner, MD;  Location: [facility] OR;  Service: General;  Laterality: Right;   COLONOSCOPY     ORIF FEMUR FRACTURE Right 05/13/2020   Procedure: OPEN REDUCTION INTERNAL FIXATION (ORIF) DISTAL FEMUR FRACTURE;  Surgeon: Juanell Fairly, MD;  Location: [facility] ORS;  Service: Orthopedics;  Laterality: Right;   TUBAL LIGATION  1958   UPPER GASTROINTESTINAL ENDOSCOPY     Family History  Problem Relation Age of Onset   Bone cancer Mother    Arthritis Mother    Diabetes Sister    Arthritis Sister    Stomach cancer Maternal Aunt    Prostate cancer Cousin        maternal first cousin   Social History   Socioeconomic History   Marital status: Widowed    Spouse name: Not on file   Number of children: 5   Years of education: 11TH    Highest education level: Not on file  Occupational History   Occupation: homemaker    Employer: RETIRED  Tobacco Use   Smoking status: Never   Smokeless tobacco: Never  Vaping Use   Vaping status: Never Used  Substance and Sexual Activity   Alcohol use: No   Drug use: No   Sexual activity: Not on file  Other Topics Concern   Not on file  Social History Narrative   Not on file   Social Determinants of Health   Financial Resource Strain: Low Risk  (10/15/2022)   Overall Financial Resource Strain (CARDIA)    Difficulty of Paying Living Expenses: Not hard at all  Food Insecurity: No Food Insecurity (10/15/2022)   Hunger Vital Sign    Worried About Running Out of Food in the Last Year: Never true    Ran Out of Food in the Last Year: Never true  Transportation Needs: No  Transportation Needs (10/15/2022)   PRAPARE - Administrator, Civil Service (Medical): No    Lack of Transportation (Non-Medical): No  Physical Activity: Inactive (10/15/2022)   Exercise Vital Sign    Days of Exercise per Week: 0 days    Minutes of Exercise per Session: 0 min  Stress: No Stress Concern Present (10/15/2022)   [employer] of Occupational Health - Occupational Stress Questionnaire    Feeling of Stress : Not at all  Social Connections: Moderately Isolated (10/15/2022)   Social Connection and Isolation Panel [NHANES]    Frequency of Communication with Friends  and Family: Three times a week    Frequency of Social Gatherings with Friends and Family: Three times a week    Attends Religious Services: More than 4 times per year    Active Member of Clubs or Organizations: No    Attends Banker Meetings: Never    Marital Status: Widowed    Tobacco Counseling Counseling given: Not Answered   Clinical Intake:  Pre-visit preparation completed: Yes  Pain : No/denies pain Pain Score: 0-No pain     BMI - recorded:  (patient in wheelchair) Nutritional Risks: None Diabetes: No  How often do you need to have someone help you when you read instructions, pamphlets, or other written materials from your doctor or pharmacy?: 1 - Never What is the last grade level you completed in school?: 11th grade  Interpreter Needed?: No  Information entered by :: Leita Lindbloom N. Kaho Selle, LPN.   Activities of Daily Living    10/15/2022    8:53 AM 07/29/2022   10:26 AM  In your present state of health, do you have any difficulty performing the following activities:  Hearing? 0   Vision? 0   Difficulty concentrating or making decisions? 0   Walking or climbing stairs? 1   Dressing or bathing? 0   Doing errands, shopping? 1 0  Preparing Food and eating ? N   Using the Toilet? N   In the past six months, have you accidently leaked urine? Y   Do you have problems  with loss of bowel control? Y   Managing your Medications? N   Managing your Finances? N   Housekeeping or managing your Housekeeping? Y     Patient Care Team: Corwin Levins, MD as PCP - General [facility] as Consulting Physician (Ophthalmology) Donnelly Angelica, RN as Oncology Nurse Navigator Pershing Proud, RN as Oncology Nurse Navigator Malachy Mood, MD as Consulting Physician (Hematology) Antony Blackbird, MD as Consulting Physician (Radiation Oncology) Griselda Miner, MD as Consulting Physician (General Surgery)  Indicate any recent Medical Services you may have received from other than [facility] providers in the past year (date may be approximate).     Assessment:   This is a routine wellness examination for Maurisa.  Hearing/Vision screen Hearing Screening - Comments:: Patient denied any hearing difficulty.   No hearing aids.  Vision Screening - Comments:: Patient does wear corrective lenses/contacts.  Annual eye exam done by: [facility]   Dietary issues and exercise activities discussed:     Goals Addressed             This Visit's Progress    Client understands the importance of follow-up with providers by attending scheduled visits.        Depression Screen    10/15/2022    8:52 AM 08/20/2022   10:24 AM 07/23/2022    3:37 PM 02/18/2022    3:36 PM 02/18/2022   10:30 AM 10/10/2021    2:07 PM 10/10/2021    2:05 PM  PHQ 2/9 Scores  PHQ - 2 Score 0 0 0 0 0 0 0  PHQ- 9 Score 0    0      Fall Risk    10/15/2022    8:52 AM 08/20/2022   10:24 AM 02/18/2022   10:30 AM 10/10/2021    2:07 PM 08/20/2021   11:22 AM  Fall Risk   Falls in the past year? 0 0 0 0 0  Number falls in past yr:  0 0 1 0 0  Injury with Fall? 0 0  0 0  Risk for fall due to : No Fall Risks No Fall Risks No Fall Risks --   Risk for fall due to: Comment    wheelchair   Follow up Falls prevention discussed Falls evaluation completed Falls evaluation completed Falls evaluation  completed;Education provided     MEDICARE RISK AT HOME:  Medicare Risk at Home - 10/15/22 0850     Any stairs in or around the home? No    If so, are there any without handrails? No    Home free of loose throw rugs in walkways, pet beds, electrical cords, etc? Yes    Adequate lighting in your home to reduce risk of falls? Yes    Life alert? No    Use of a cane, walker or w/c? Yes    Grab bars in the bathroom? Yes    Shower chair or bench in shower? Yes    Elevated toilet seat or a handicapped toilet? Yes             TIMED UP AND GO:  Was the test performed?  No    Cognitive Function:    11/04/2016   11:53 AM  MMSE - Mini Mental State Exam  Orientation to time 5  Orientation to Place 5  Registration 3  Attention/ Calculation 5  Recall 2  Language- name 2 objects 2  Language- repeat 1  Language- follow 3 step command 3  Language- read & follow direction 1  Write a sentence 1  Copy design 1  Total score 29        10/15/2022    8:52 AM  6CIT Screen  What Year? 0 points  What month? 0 points  What time? 0 points  Count back from 20 0 points  Months in reverse 0 points  Repeat phrase 0 points  Total Score 0 points    Immunizations Immunization History  Administered Date(s) Administered   Fluad Quad(high Dose 65+) 02/17/2021   Influenza Split 12/31/2010, 12/07/2011   Influenza Whole 03/23/2005, 12/09/2007, 12/17/2008, 02/04/2010   Influenza, High Dose Seasonal PF 12/23/2016, 12/26/2018   Influenza,inj,Quad PF,6+ Mos 12/15/2012, 11/09/2013, 12/25/2014, 11/13/2015, 01/14/2018   Influenza-Unspecified 12/26/2019, 12/16/2021   PFIZER(Purple Top)SARS-COV-2 Vaccination 04/12/2019, 05/03/2019, 02/09/2020, 12/16/2021   Pneumococcal Conjugate-13 05/12/2013   Pneumococcal Polysaccharide-23 03/23/2005, 12/31/2010   Td 03/24/1999, 02/04/2010   Tdap 08/21/2022   Zoster Recombinant(Shingrix) 08/21/2022   Zoster, Live 12/09/2007    TDAP status: Up to date  Flu  Vaccine status: Up to date  Pneumococcal vaccine status: Up to date  Covid-19 vaccine status: Completed vaccines  Qualifies for Shingles Vaccine? Yes   Zostavax completed Yes   Shingrix Completed?: No.    Education has been provided regarding the importance of this vaccine. Patient has been advised to call insurance company to determine out of pocket expense if they have not yet received this vaccine. Advised may also receive vaccine at local pharmacy or Health Dept. Verbalized acceptance and understanding.  Screening Tests Health Maintenance  Topic Date Due   DEXA SCAN  Never done   Zoster Vaccines- Shingrix (2 of 2) 10/16/2022   INFLUENZA VACCINE  10/22/2022   Medicare Annual Wellness (AWV)  10/15/2023   DTaP/Tdap/Td (4 - Td or Tdap) 08/20/2032   Pneumonia Vaccine 68+ Years old  Completed   HPV VACCINES  Aged Out   COVID-19 Vaccine  Discontinued    Health Maintenance  Health Maintenance Due  Topic Date Due   DEXA SCAN  Never done    Colorectal cancer screening: No longer required.   Mammogram status: Completed 08/04/2022. Repeat every year  Bone Density scan: Never done  Lung Cancer Screening: (Low Dose CT Chest recommended if Age 75-80 years, 20 pack-year currently smoking OR have quit w/in 15years.) does not qualify.   Lung Cancer Screening Referral: no  Additional Screening:  Hepatitis C Screening: does not qualify; Completed: no  Vision Screening: Recommended annual ophthalmology exams for early detection of glaucoma and other disorders of the eye. Is the patient up to date with their annual eye exam?  Yes  Who is the provider or what is the name of the office in which the patient attends annual eye exams? [facility] If pt is not established with a provider, would they like to be referred to a provider to establish care? No .   Dental Screening: Recommended annual dental exams for proper oral hygiene  Diabetic Foot Exam: N/A  Community Resource Referral /  Chronic Care Management: CRR required this visit?  No   CCM required this visit?  No     Plan:     I have personally reviewed and noted the following in the patient's chart:   Medical and social history Use of alcohol, tobacco or illicit drugs  Current medications and supplements including opioid prescriptions. Patient is currently taking opioid prescriptions. Information provided to patient regarding non-opioid alternatives. Patient advised to discuss non-opioid treatment plan with their provider. Functional ability and status Nutritional status Physical activity Advanced directives List of other physicians Hospitalizations, surgeries, and ER visits in previous 12 months Vitals Screenings to include cognitive, depression, and falls Referrals and appointments  In addition, I have reviewed and discussed with patient certain preventive protocols, quality metrics, and best practice recommendations. A written personalized care plan for preventive services as well as general preventive health recommendations were provided to patient.     Mickeal Needy, LPN   06/29/8117   After Visit Summary: (Mail) Due to this being a telephonic visit, the after visit summary with patients personalized plan was offered to patient via mail   Nurse Notes: Normal cognitive status assessed by direct observation via telephone conversation by this Nurse Health Advisor. No abnormalities found.

## 2022-10-15 NOTE — Patient Instructions (Signed)
Ms. Deanna Wall , Thank you for taking time to come for your Medicare Wellness Visit. I appreciate your ongoing commitment to your health goals. Please review the following plan we discussed and let me know if I can assist you in the future.   Referrals/Orders/Follow-Ups/Clinician Recommendations: No  This is a list of the screening recommended for you and due dates:  Health Maintenance  Topic Date Due   DEXA scan (bone density measurement)  Never done   Zoster (Shingles) Vaccine (2 of 2) 10/16/2022   Flu Shot  10/22/2022   Medicare Annual Wellness Visit  10/15/2023   DTaP/Tdap/Td vaccine (4 - Td or Tdap) 08/20/2032   Pneumonia Vaccine  Completed   HPV Vaccine  Aged Out   COVID-19 Vaccine  Discontinued    Advanced directives: (Copy Requested) Please bring a copy of your health care power of attorney and living will to the office to be added to your chart at your convenience.  Next Medicare Annual Wellness Visit scheduled for next year: Yes  Preventive Care 87 Years and Older, Female Preventive care refers to lifestyle choices and visits with your health care provider that can promote health and wellness. What does preventive care include? A yearly physical exam. This is also called an annual well check. Dental exams once or twice a year. Routine eye exams. Ask your health care provider how often you should have your eyes checked. Personal lifestyle choices, including: Daily care of your teeth and gums. Regular physical activity. Eating a healthy diet. Avoiding tobacco and drug use. Limiting alcohol use. Practicing safe sex. Taking low-dose aspirin every day. Taking vitamin and mineral supplements as recommended by your health care provider. What happens during an annual well check? The services and screenings done by your health care provider during your annual well check will depend on your age, overall health, lifestyle risk factors, and family history of disease. Counseling   Your health care provider may ask you questions about your: Alcohol use. Tobacco use. Drug use. Emotional well-being. Home and relationship well-being. Sexual activity. Eating habits. History of falls. Memory and ability to understand (cognition). Work and work Astronomer. Reproductive health. Screening  You may have the following tests or measurements: Height, weight, and BMI. Blood pressure. Lipid and cholesterol levels. These may be checked every 5 years, or more frequently if you are over 17 years old. Skin check. Lung cancer screening. You may have this screening every year starting at age 16 if you have a 30-pack-year history of smoking and currently smoke or have quit within the past 15 years. Fecal occult blood test (FOBT) of the stool. You may have this test every year starting at age 23. Flexible sigmoidoscopy or colonoscopy. You may have a sigmoidoscopy every 5 years or a colonoscopy every 10 years starting at age 37. Hepatitis C blood test. Hepatitis B blood test. Sexually transmitted disease (STD) testing. Diabetes screening. This is done by checking your blood sugar (glucose) after you have not eaten for a while (fasting). You may have this done every 1-3 years. Bone density scan. This is done to screen for osteoporosis. You may have this done starting at age 20. Mammogram. This may be done every 1-2 years. Talk to your health care provider about how often you should have regular mammograms. Talk with your health care provider about your test results, treatment options, and if necessary, the need for more tests. Vaccines  Your health care provider may recommend certain vaccines, such as: Influenza vaccine. This is  recommended every year. Tetanus, diphtheria, and acellular pertussis (Tdap, Td) vaccine. You may need a Td booster every 10 years. Zoster vaccine. You may need this after age 43. Pneumococcal 13-valent conjugate (PCV13) vaccine. One dose is recommended  after age 75. Pneumococcal polysaccharide (PPSV23) vaccine. One dose is recommended after age 12. Talk to your health care provider about which screenings and vaccines you need and how often you need them. This information is not intended to replace advice given to you by your health care provider. Make sure you discuss any questions you have with your health care provider. Document Released: 04/05/2015 Document Revised: 11/27/2015 Document Reviewed: 01/08/2015 Elsevier Interactive Patient Education  2017 [employer].  Fall Prevention in the Home Falls can cause injuries. They can happen to people of all ages. There are many things you can do to make your home safe and to help prevent falls. What can I do on the outside of my home? Regularly fix the edges of walkways and driveways and fix any cracks. Remove anything that might make you trip as you walk through a door, such as a raised step or threshold. Trim any bushes or trees on the path to your home. Use bright outdoor lighting. Clear any walking paths of anything that might make someone trip, such as rocks or tools. Regularly check to see if handrails are loose or broken. Make sure that both sides of any steps have handrails. Any raised decks and porches should have guardrails on the edges. Have any leaves, snow, or ice cleared regularly. Use sand or salt on walking paths during winter. Clean up any spills in your garage right away. This includes oil or grease spills. What can I do in the bathroom? Use night lights. Install grab bars by the toilet and in the tub and shower. Do not use towel bars as grab bars. Use non-skid mats or decals in the tub or shower. If you need to sit down in the shower, use a plastic, non-slip stool. Keep the floor dry. Clean up any water that spills on the floor as soon as it happens. Remove soap buildup in the tub or shower regularly. Attach bath mats securely with double-sided non-slip rug tape. Do not  have throw rugs and other things on the floor that can make you trip. What can I do in the bedroom? Use night lights. Make sure that you have a light by your bed that is easy to reach. Do not use any sheets or blankets that are too big for your bed. They should not hang down onto the floor. Have a firm chair that has side arms. You can use this for support while you get dressed. Do not have throw rugs and other things on the floor that can make you trip. What can I do in the kitchen? Clean up any spills right away. Avoid walking on wet floors. Keep items that you use a lot in easy-to-reach places. If you need to reach something above you, use a strong step stool that has a grab bar. Keep electrical cords out of the way. Do not use floor polish or wax that makes floors slippery. If you must use wax, use non-skid floor wax. Do not have throw rugs and other things on the floor that can make you trip. What can I do with my stairs? Do not leave any items on the stairs. Make sure that there are handrails on both sides of the stairs and use them. Fix handrails that are  broken or loose. Make sure that handrails are as long as the stairways. Check any carpeting to make sure that it is firmly attached to the stairs. Fix any carpet that is loose or worn. Avoid having throw rugs at the top or bottom of the stairs. If you do have throw rugs, attach them to the floor with carpet tape. Make sure that you have a light switch at the top of the stairs and the bottom of the stairs. If you do not have them, ask someone to add them for you. What else can I do to help prevent falls? Wear shoes that: Do not have high heels. Have rubber bottoms. Are comfortable and fit you well. Are closed at the toe. Do not wear sandals. If you use a stepladder: Make sure that it is fully opened. Do not climb a closed stepladder. Make sure that both sides of the stepladder are locked into place. Ask someone to hold it for  you, if possible. Clearly mark and make sure that you can see: Any grab bars or handrails. First and last steps. Where the edge of each step is. Use tools that help you move around (mobility aids) if they are needed. These include: Canes. Walkers. Scooters. Crutches. Turn on the lights when you go into a dark area. Replace any light bulbs as soon as they burn out. Set up your furniture so you have a clear path. Avoid moving your furniture around. If any of your floors are uneven, fix them. If there are any pets around you, be aware of where they are. Review your medicines with your doctor. Some medicines can make you feel dizzy. This can increase your chance of falling. Ask your doctor what other things that you can do to help prevent falls. This information is not intended to replace advice given to you by your health care provider. Make sure you discuss any questions you have with your health care provider. Document Released: 01/03/2009 Document Revised: 08/15/2015 Document Reviewed: 04/13/2014 Elsevier Interactive Patient Education  2017 [employer].

## 2022-10-20 DIAGNOSIS — F419 Anxiety disorder, unspecified: Secondary | ICD-10-CM | POA: Diagnosis not present

## 2022-10-20 DIAGNOSIS — C50211 Malignant neoplasm of upper-inner quadrant of right female breast: Secondary | ICD-10-CM | POA: Diagnosis not present

## 2022-11-22 ENCOUNTER — Other Ambulatory Visit: Payer: Self-pay | Admitting: Internal Medicine

## 2022-11-22 ENCOUNTER — Other Ambulatory Visit: Payer: Self-pay | Admitting: Hematology

## 2022-11-24 ENCOUNTER — Other Ambulatory Visit: Payer: Self-pay

## 2022-11-26 ENCOUNTER — Encounter: Payer: Self-pay | Admitting: Hematology

## 2022-11-26 ENCOUNTER — Inpatient Hospital Stay: Payer: Medicare Other | Attending: Hematology | Admitting: Hematology

## 2022-11-26 DIAGNOSIS — Z170 Estrogen receptor positive status [ER+]: Secondary | ICD-10-CM | POA: Diagnosis not present

## 2022-11-26 DIAGNOSIS — C50411 Malignant neoplasm of upper-outer quadrant of right female breast: Secondary | ICD-10-CM

## 2022-11-26 NOTE — Assessment & Plan Note (Addendum)
pT1cN0M0, stage IA, G2, ER+/PR+/HER2- -this was discovered on screening mammogram -She underwent right breast lumpectomy on Aug 05, 2022.  I reviewed her surgical pathology findings, which showed a 1.2 cm invasive ductal carcinoma, with positive lymphovascular invasion, grade 2, negative margins. -Due to her advanced age and medical comorbidities, Oncotype was not recommended -Due to her advanced age, immobility from MS, she is not a candidate for adjuvant radiation  ---she started adjuvant exemestane in 08/2022, she is tolerating well, no side effects  -Given her advanced age, and her medical comorbidities, if she has side effects from antiestrogen therapy, I have a low threshold to stop it.  If she decided not to pursue, it is also reasonable.

## 2022-11-26 NOTE — Progress Notes (Signed)
[facility]   [phone] [phone]   Clinic Follow up Note   Patient Care Team: Corwin Levins, MD as PCP - General [facility] as Consulting Physician (Ophthalmology) Donnelly Angelica, RN as Oncology Nurse Navigator Pershing Proud, RN as Oncology Nurse Navigator Malachy Mood, MD as Consulting Physician (Hematology) Antony Blackbird, MD as Consulting Physician (Radiation Oncology) Griselda Miner, MD as Consulting Physician (General Surgery)  Date of Service:  11/26/2022  I connected with Deanna Wall on 11/26/2022 at  2:00 PM EDT by telephone visit and verified that I am speaking with the correct person using two identifiers.  I discussed the limitations, risks, security and privacy concerns of performing an evaluation and management service by telephone and the availability of in person appointments. I also discussed with the patient that there may be a patient responsible charge related to this service. The patient expressed understanding and agreed to proceed.   Other persons participating in the visit and their role in the encounter:  no  Patient's location:  Home Provider's location:  [facility] Office  CHIEF COMPLAINT: f/u of right breast cancer  CURRENT THERAPY:   Exmestane started 08/2022  ASSESSMENT & PLAN:  Deanna Wall is a 89 y.o. female with    Malignant neoplasm of upper-outer quadrant of right breast in female, estrogen receptor positive (HCC) pT1cN0M0, stage IA, G2, ER+/PR+/HER2- -this was discovered on screening mammogram -She underwent right breast lumpectomy on Aug 05, 2022.  I reviewed her surgical pathology findings, which showed a 1.2 cm invasive ductal carcinoma, with positive lymphovascular invasion, grade 2, negative margins. -Due to her advanced age and medical comorbidities, Oncotype was not recommended -Due to her advanced age, immobility from MS, she is not a candidate for adjuvant radiation  ---she  started adjuvant exemestane in 08/2022, she is tolerating well, no side effects  -Given her advanced age, and her medical comorbidities, if she has side effects from antiestrogen therapy, I have a low threshold to stop it.  If she decided not to pursue, it is also reasonable.  PLAN: -continue Exemestane -lab and f/u in 3 months   SUMMARY OF ONCOLOGIC HISTORY: Oncology History Overview Note   Cancer Staging  Malignant neoplasm of upper-outer quadrant of right breast in female, estrogen receptor positive Staging form: Breast, AJCC 8th Edition - Clinical stage from 07/08/2022: Stage IA (cT1c, cN0, cM0, G2, ER+, PR+, HER2-) - Signed by Malachy Mood, MD on 07/14/2022 Stage prefix: Initial diagnosis Histologic grading system: 3 grade system     Malignant neoplasm of upper-outer quadrant of right breast in female, estrogen receptor positive (HCC)  07/08/2022 Cancer Staging   Staging form: Breast, AJCC 8th Edition - Clinical stage from 07/08/2022: Stage IA (cT1c, cN0, cM0, G2, ER+, PR+, HER2-) - Signed by Malachy Mood, MD on 07/14/2022 Stage prefix: Initial diagnosis Histologic grading system: 3 grade system   07/13/2022 Initial Diagnosis   Malignant neoplasm of upper-outer quadrant of right breast in female, estrogen receptor positive   08/05/2022 Cancer Staging   Staging form: Breast, AJCC 8th Edition - Pathologic stage from 08/05/2022: Stage Unknown (pT1c, pNX, cM0, G2, ER+, PR+, HER2-) - Signed by Malachy Mood, MD on 11/26/2022 Histologic grading system: 3 grade system Residual tumor (R): R0 - None      INTERVAL HISTORY:  Deanna Wall was contacted for a follow up of Right Breast Cancer, ER positive .She was last seen by me on 08/26/2022. Pt state that she  has started the Exemestane and she has no issues. She denies having hot flashes.   All other systems were reviewed with the patient and are negative.  MEDICAL HISTORY:  Past Medical History:  Diagnosis Date   ANEMIA, PERNICIOUS 01/30/2009    Breast cancer (HCC)    Cataracts, bilateral    DEGENERATIVE DISC DISEASE 10/28/2006   ECZEMA 10/28/2006   Gait disorder    Glaucoma    HYPERLIPIDEMIA 02/16/2010   HYPERTENSION 10/28/2006   Hypothyroidism    Impaired glucose tolerance 12/31/2010   MS (multiple sclerosis) (HCC)    Multiple sclerosis (HCC) 10/28/2006   Obesity    OSTEOARTHRITIS, KNEES, BILATERAL, SEVERE 06/14/2009   Sebaceous cyst    chest    VITAMIN B12 DEFICIENCY 06/14/2009    SURGICAL HISTORY: Past Surgical History:  Procedure Laterality Date   BREAST LUMPECTOMY WITH RADIOACTIVE SEED LOCALIZATION Right 08/05/2022   Procedure: RIGHT BREAST LUMPECTOMY WITH RADIOACTIVE SEED LOCALIZATION;  Surgeon: Griselda Miner, MD;  Location: [facility] OR;  Service: General;  Laterality: Right;   COLONOSCOPY     ORIF FEMUR FRACTURE Right 05/13/2020   Procedure: OPEN REDUCTION INTERNAL FIXATION (ORIF) DISTAL FEMUR FRACTURE;  Surgeon: Juanell Fairly, MD;  Location: [facility] ORS;  Service: Orthopedics;  Laterality: Right;   TUBAL LIGATION  1958   UPPER GASTROINTESTINAL ENDOSCOPY      I have reviewed the social history and family history with the patient and they are unchanged from previous note.  ALLERGIES:  is allergic to atorvastatin.  MEDICATIONS:  Current Outpatient Medications  Medication Sig Dispense Refill   amLODipine (NORVASC) 5 MG tablet Take 1 tablet by mouth once daily 90 tablet 2   aspirin 81 MG tablet Take 81 mg by mouth daily.     cholecalciferol (VITAMIN D3) 25 MCG (1000 UNIT) tablet Take 2,000 Units by mouth daily.     citalopram (CELEXA) 10 MG tablet Take 1 tablet by mouth once daily 90 tablet 2   cloNIDine (CATAPRES) 0.3 MG tablet Take 1 tablet by mouth twice daily (Patient taking differently: Take 0.3 mg by mouth 2 (two) times daily.) 180 tablet 3   diclofenac sodium (VOLTAREN) 1 % GEL Apply 2 g topically 4 (four) times daily as needed. 200 g 5   dorzolamide (TRUSOPT) 2 % ophthalmic solution 1 drop 2 (two) times  daily.     exemestane (AROMASIN) 25 MG tablet TAKE 1 TABLET BY MOUTH ONCE DAILY AFTER BREAKFAST 30 tablet 0   ezetimibe (ZETIA) 10 MG tablet Take 1 tablet by mouth once daily 90 tablet 3   furosemide (LASIX) 20 MG tablet TAKE 1 TABLET BY MOUTH ONCE DAILY AS NEEDED FOR  SWELLING (Patient taking differently: Take 20 mg by mouth daily.) 90 tablet 2   latanoprost (XALATAN) 0.005 % ophthalmic solution Place 1 drop into both eyes at bedtime.  3   levothyroxine (SYNTHROID) 25 MCG tablet Take 1 tablet by mouth once daily 90 tablet 3   nitrofurantoin (MACRODANTIN) 50 MG capsule Take 1 capsule by mouth once daily 30 capsule 5   oxyCODONE (ROXICODONE) 5 MG immediate release tablet Take 1 tablet (5 mg total) by mouth every 6 (six) hours as needed for severe pain. 10 tablet 0   polyethylene glycol powder (GLYCOLAX/MIRALAX) 17 GM/SCOOP powder Take 17 g by mouth 2 (two) times daily as needed. 3350 g 1   potassium chloride (KLOR-CON) 10 MEQ tablet Take 1 tablet (10 mEq total) by mouth daily. 90 tablet 2   predniSONE (DELTASONE) 50 MG tablet  Take one tablet by mouth once daily for 5 days. 5 tablet 0   vitamin B-12 (CYANOCOBALAMIN) 500 MCG tablet Take 500 mcg by mouth daily.     No current facility-administered medications for this visit.    PHYSICAL EXAMINATION: ECOG PERFORMANCE STATUS: 0 - Asymptomatic  There were no vitals filed for this visit. Wt Readings from Last 3 Encounters:  08/05/22 250 lb (113.4 kg)  07/29/22 250 lb (113.4 kg)  05/13/20 203 lb 11.3 oz (92.4 kg)     No vitals taken today, Exam not performed today  LABORATORY DATA:  I have reviewed the data as listed    Latest Ref Rng & Units 09/05/2022   12:36 PM 08/20/2022   11:10 AM 07/15/2022   12:24 PM  CBC  WBC 4.0 - 10.5 K/uL 8.9  6.1  7.0   Hemoglobin 12.0 - 15.0 g/dL 09.8  11.9  14.7   Hematocrit 36.0 - 46.0 % 36.4  33.9  34.3   Platelets 150 - 400 K/uL 359  346.0  318         Latest Ref Rng & Units 09/05/2022   12:36 PM  08/20/2022   11:10 AM 07/15/2022   12:24 PM  CMP  Glucose 70 - 99 mg/dL 829  562  130   BUN 8 - 23 mg/dL 11  11  15    Creatinine 0.44 - 1.00 mg/dL 8.65  7.84  6.96   Sodium 135 - 145 mmol/L 141  140  140   Potassium 3.5 - 5.1 mmol/L 3.5  3.7  3.8   Chloride 98 - 111 mmol/L 106  106  107   CO2 22 - 32 mmol/L 27  26  28    Calcium 8.9 - 10.3 mg/dL 9.2  8.7  9.2   Total Protein 6.0 - 8.3 g/dL  7.1  7.4   Total Bilirubin 0.2 - 1.2 mg/dL  0.5  0.7   Alkaline Phos 39 - 117 U/L  82  81   AST 0 - 37 U/L  15  16   ALT 0 - 35 U/L  11  13       RADIOGRAPHIC STUDIES: I have personally reviewed the radiological images as listed and agreed with the findings in the report. No results found.    No orders of the defined types were placed in this encounter.  All questions were answered. The patient knows to call the clinic with any problems, questions or concerns. No barriers to learning was detected. The total time spent in the appointment was 11 minutes.     Malachy Mood, MD 11/26/2022   Carolin Coy am acting as scribe for Malachy Mood, MD.   I have reviewed the above documentation for accuracy and completeness, and I agree with the above.

## 2022-12-14 DIAGNOSIS — H401232 Low-tension glaucoma, bilateral, moderate stage: Secondary | ICD-10-CM | POA: Diagnosis not present

## 2022-12-14 DIAGNOSIS — H0102B Squamous blepharitis left eye, upper and lower eyelids: Secondary | ICD-10-CM | POA: Diagnosis not present

## 2022-12-14 DIAGNOSIS — H0102A Squamous blepharitis right eye, upper and lower eyelids: Secondary | ICD-10-CM | POA: Diagnosis not present

## 2022-12-14 DIAGNOSIS — H524 Presbyopia: Secondary | ICD-10-CM | POA: Diagnosis not present

## 2022-12-14 DIAGNOSIS — H04123 Dry eye syndrome of bilateral lacrimal glands: Secondary | ICD-10-CM | POA: Diagnosis not present

## 2022-12-21 ENCOUNTER — Telehealth: Payer: Self-pay | Admitting: Internal Medicine

## 2022-12-21 NOTE — Telephone Encounter (Signed)
Mitzi Hansen, patient's daughter, came by states patient needs a wheelchair, but insurance states they need a letter from the PCP. If any questions can reach patient at [phone]. Patient's daughter can be reached at [phone]. When letter is complete can call and they will pick it up.

## 2022-12-22 ENCOUNTER — Encounter: Payer: Self-pay | Admitting: Internal Medicine

## 2022-12-22 NOTE — Telephone Encounter (Signed)
Please Print and have ready for pickup by family  - letter done today

## 2022-12-23 ENCOUNTER — Other Ambulatory Visit: Payer: Self-pay | Admitting: Hematology

## 2022-12-23 NOTE — Telephone Encounter (Signed)
Called and let daughter know, letter has been placed up front for pick up.

## 2022-12-29 ENCOUNTER — Encounter: Payer: Self-pay | Admitting: Internal Medicine

## 2023-01-12 ENCOUNTER — Encounter: Payer: Self-pay | Admitting: Nurse Practitioner

## 2023-01-12 ENCOUNTER — Inpatient Hospital Stay: Payer: Medicare Other | Attending: Hematology | Admitting: Nurse Practitioner

## 2023-01-12 VITALS — BP 134/55 | HR 58 | Temp 98.8°F | Resp 17

## 2023-01-12 DIAGNOSIS — C50411 Malignant neoplasm of upper-outer quadrant of right female breast: Secondary | ICD-10-CM | POA: Diagnosis not present

## 2023-01-12 DIAGNOSIS — Z800 Family history of malignant neoplasm of digestive organs: Secondary | ICD-10-CM | POA: Insufficient documentation

## 2023-01-12 DIAGNOSIS — Z808 Family history of malignant neoplasm of other organs or systems: Secondary | ICD-10-CM | POA: Insufficient documentation

## 2023-01-12 DIAGNOSIS — Z170 Estrogen receptor positive status [ER+]: Secondary | ICD-10-CM

## 2023-01-12 NOTE — Progress Notes (Signed)
CLINIC:  Survivorship   Patient Care Team: Corwin Levins, MD as PCP - General [facility] as Consulting Physician (Ophthalmology) Donnelly Angelica, RN as Oncology Nurse Navigator Pershing Proud, RN as Oncology Nurse Navigator Malachy Mood, MD as Consulting Physician (Hematology) Antony Blackbird, MD as Consulting Physician (Radiation Oncology) Griselda Miner, MD as Consulting Physician (General Surgery) Pollyann Samples, NP as Nurse Practitioner (Nurse Practitioner)   REASON FOR VISIT:  Routine follow-up post-treatment for a recent history of breast cancer.  BRIEF ONCOLOGIC HISTORY:  Oncology History Overview Note   Cancer Staging  Malignant neoplasm of upper-outer quadrant of right breast in female, estrogen receptor positive Staging form: Breast, AJCC 8th Edition - Clinical stage from 07/08/2022: Stage IA (cT1c, cN0, cM0, G2, ER+, PR+, HER2-) - Signed by Malachy Mood, MD on 07/14/2022 Stage prefix: Initial diagnosis Histologic grading system: 3 grade system     Malignant neoplasm of upper-outer quadrant of right breast in female, estrogen receptor positive (HCC)  07/08/2022 Cancer Staging   Staging form: Breast, AJCC 8th Edition - Clinical stage from 07/08/2022: Stage IA (cT1c, cN0, cM0, G2, ER+, PR+, HER2-) - Signed by Malachy Mood, MD on 07/14/2022 Stage prefix: Initial diagnosis Histologic grading system: 3 grade system   07/13/2022 Initial Diagnosis   Malignant neoplasm of upper-outer quadrant of right breast in female, estrogen receptor positive   08/05/2022 Cancer Staging   Staging form: Breast, AJCC 8th Edition - Pathologic stage from 08/05/2022: Stage Unknown (pT1c, pNX, cM0, G2, ER+, PR+, HER2-) - Signed by Malachy Mood, MD on 11/26/2022 Histologic grading system: 3 grade system Residual tumor (R): R0 - None   08/2022 -  Anti-estrogen oral therapy   Began adjuvant anti-estrogen therapy with Exemestane   01/12/2023 Survivorship   SCP delivered by Santiago Glad,  NP     INTERVAL HISTORY:  Ms. Mellish presents to the Survivorship Clinic today for our initial meeting to review her survivorship care plan detailing her treatment course for breast cancer, as well as monitoring long-term side effects of that treatment, education regarding health maintenance, screening, and overall wellness and health promotion.     Overall, Ms. Whiteman reports feeling well in general. Eating and drinking. Denies concerns from surgery. Tolerating exemestane. Makes her feel like she has to have frequent BMs but only going once daily.     REVIEW OF SYSTEMS:  Review of Systems - Oncology Breast: Denies any new nodularity, masses, tenderness, nipple changes, or nipple discharge.      ONCOLOGY TREATMENT TEAM:  1. Surgeon:  Dr. Carolynne Edouard at [facility] 2. Medical Oncologist: Dr. Mosetta Putt     PAST MEDICAL/SURGICAL HISTORY:  Past Medical History:  Diagnosis Date   ANEMIA, PERNICIOUS 01/30/2009   Breast cancer (HCC)    Cataracts, bilateral    DEGENERATIVE DISC DISEASE 10/28/2006   ECZEMA 10/28/2006   Gait disorder    Glaucoma    HYPERLIPIDEMIA 02/16/2010   HYPERTENSION 10/28/2006   Hypothyroidism    Impaired glucose tolerance 12/31/2010   MS (multiple sclerosis) (HCC)    Multiple sclerosis (HCC) 10/28/2006   Obesity    OSTEOARTHRITIS, KNEES, BILATERAL, SEVERE 06/14/2009   Sebaceous cyst    chest    VITAMIN B12 DEFICIENCY 06/14/2009   Past Surgical History:  Procedure Laterality Date   BREAST LUMPECTOMY WITH RADIOACTIVE SEED LOCALIZATION Right 08/05/2022   Procedure: RIGHT BREAST LUMPECTOMY WITH RADIOACTIVE SEED LOCALIZATION;  Surgeon: Griselda Miner, MD;  Location: [facility] OR;  Service: General;  Laterality:  Right;   COLONOSCOPY     ORIF FEMUR FRACTURE Right 05/13/2020   Procedure: OPEN REDUCTION INTERNAL FIXATION (ORIF) DISTAL FEMUR FRACTURE;  Surgeon: Juanell Fairly, MD;  Location: [facility] ORS;  Service: Orthopedics;  Laterality: Right;   TUBAL LIGATION  1958    UPPER GASTROINTESTINAL ENDOSCOPY       ALLERGIES:  Allergies  Allergen Reactions   Atorvastatin     REACTION: feels funny in face and head     CURRENT MEDICATIONS:  Outpatient Encounter Medications as of 01/12/2023  Medication Sig   amLODipine (NORVASC) 5 MG tablet Take 1 tablet by mouth once daily   aspirin 81 MG tablet Take 81 mg by mouth daily.   cholecalciferol (VITAMIN D3) 25 MCG (1000 UNIT) tablet Take 2,000 Units by mouth daily.   citalopram (CELEXA) 10 MG tablet Take 1 tablet by mouth once daily   cloNIDine (CATAPRES) 0.3 MG tablet Take 1 tablet by mouth twice daily (Patient taking differently: Take 0.3 mg by mouth 2 (two) times daily.)   diclofenac sodium (VOLTAREN) 1 % GEL Apply 2 g topically 4 (four) times daily as needed.   dorzolamide (TRUSOPT) 2 % ophthalmic solution 1 drop 2 (two) times daily.   exemestane (AROMASIN) 25 MG tablet TAKE 1 TABLET BY MOUTH ONCE DAILY AFTER BREAKFAST   ezetimibe (ZETIA) 10 MG tablet Take 1 tablet by mouth once daily   furosemide (LASIX) 20 MG tablet TAKE 1 TABLET BY MOUTH ONCE DAILY AS NEEDED FOR  SWELLING (Patient taking differently: Take 20 mg by mouth daily.)   latanoprost (XALATAN) 0.005 % ophthalmic solution Place 1 drop into both eyes at bedtime.   levothyroxine (SYNTHROID) 25 MCG tablet Take 1 tablet by mouth once daily   nitrofurantoin (MACRODANTIN) 50 MG capsule Take 1 capsule by mouth once daily   polyethylene glycol powder (GLYCOLAX/MIRALAX) 17 GM/SCOOP powder Take 17 g by mouth 2 (two) times daily as needed.   potassium chloride (KLOR-CON) 10 MEQ tablet Take 1 tablet (10 mEq total) by mouth daily.   vitamin B-12 (CYANOCOBALAMIN) 500 MCG tablet Take 500 mcg by mouth daily.   oxyCODONE (ROXICODONE) 5 MG immediate release tablet Take 1 tablet (5 mg total) by mouth every 6 (six) hours as needed for severe pain. (Patient not taking: Reported on 01/12/2023)   predniSONE (DELTASONE) 50 MG tablet Take one tablet by mouth once daily  for 5 days. (Patient not taking: Reported on 01/12/2023)   No facility-administered encounter medications on file as of 01/12/2023.     ONCOLOGIC FAMILY HISTORY:  Family History  Problem Relation Age of Onset   Bone cancer Mother    Arthritis Mother    Diabetes Sister    Arthritis Sister    Stomach cancer Maternal Aunt    Prostate cancer Cousin        maternal first cousin     GENETIC COUNSELING/TESTING: N/A  SOCIAL HISTORY:  CREINA CARLEO is here with her daughter. Lives in [place]. Denies alcohol/tobacco/drug use.   PHYSICAL EXAMINATION:  Vital Signs:   Vitals:   01/12/23 1224  BP: (!) 134/55  Pulse: (!) 58  Resp: 17  Temp: 98.8 F (37.1 C)  SpO2: 97%   Filed Weights   General: Well-nourished, well-appearing female in no acute distress.   HEENT:   Sclerae anicteric.  Respiratory: breathing non-labored.  Neuro: No focal deficits.  Psych: Mood and affect normal and appropriate for situation.  Breast exam: deferred  LABORATORY DATA:  None for this visit.  DIAGNOSTIC IMAGING:  None for this visit.      ASSESSMENT AND PLAN:  Ms.. Strubhar is a pleasant 89 y.o. female with Stage I right breast invasive ductal carcinoma, ER+/PR+/HER2-, diagnosed in 06/2022, treated with lumpectomy and anti-estrogen therapy with Exemestane beginning in 08/2022.  She presents to the Survivorship Clinic for our initial meeting and routine follow-up post-completion of treatment for breast cancer.    1. Stage I right breast cancer:  Ms. Pruden has recovered well from definitive treatment for breast cancer. She will follow-up with her medical oncologist, Dr. Mosetta Putt in 3 months with history and physical exam per surveillance protocol.  She will continue her anti-estrogen therapy with Exemestane. Thus far, she is tolerating well, with minimal side effects. She was instructed to make Dr. Mosetta Putt or myself aware if she begins to experience any worsening side effects. Given her age, we have low  threshold to stop anti-estrogen if she has SEs. Today, a comprehensive survivorship care plan and treatment summary was reviewed with the patient today detailing her breast cancer diagnosis, treatment course, potential late/long-term effects of treatment, appropriate follow-up care with recommendations for the future, and patient education resources.  A copy of this summary, along with a letter will be sent to the patient's primary care provider via mail/fax/In Basket message after today's visit.     2. Bone health:  Given Ms. Petrak's age/history of breast cancer and her current treatment regimen including anti-estrogen therapy with exemestane, she is at risk for bone demineralization. She has not had DEXA in some time. Will repeat with next mammo.  In the meantime, she was encouraged to increase her consumption of foods rich in calcium, as well as increase her weight-bearing activities.  She was given education on specific activities to promote bone health.  3. Cancer screening:  Due to Ms. Baquero's history and her age, she should receive screening for skin and breast cancers.  The information and recommendations are listed on the patient's comprehensive care plan/treatment summary and were reviewed in detail with the patient.    4. Health maintenance and wellness promotion: Ms. Orner was encouraged to consume 5-7 servings of fruits and vegetables per day.   She was instructed to limit her alcohol consumption and continue to abstain from tobacco use  5. Support services/counseling: It is not uncommon for this period of the patient's cancer care trajectory to be one of many emotions and stressors.  We discussed an opportunity for her to participate in the next session of [facility] ("Finding Your New Normal") support group series designed for patients after they have completed treatment.   Ms. Arnstein was encouraged to take advantage of our many other support services programs, support groups, and/or counseling  in coping with her new life as a cancer survivor after completing anti-cancer treatment.  She was offered support today through active listening and expressive supportive counseling.  She was given information regarding our available services and encouraged to contact me with any questions or for help enrolling in any of our support group/programs.    Dispo:   -Continue Exemestane  -Return to cancer center in 3 months  -Mammogram and DEXA due in 06/2023 -Follow up with surgery as scheduled  -She is welcome to return back to the Survivorship Clinic at any time; no additional follow-up needed at this time.  -Consider referral back to survivorship as a long-term survivor for continued surveillance  Orders Placed This Encounter  Procedures   DG Bone Density    Standing Status:   Future  Standing Expiration Date:   01/12/2024    Order Specific Question:   Reason for Exam (SYMPTOM  OR DIAGNOSIS REQUIRED)    Answer:   screening, on AI for h/o breast cancer in 2024    Order Specific Question:   Preferred imaging location?    Answer:   [facility]     A total of (30) minutes of face-to-face time was spent with this patient with greater than 50% of that time in counseling and care-coordination.   Santiago Glad, NP Survivorship Program [facility] [phone]   Note: PRIMARY CARE PROVIDER Corwin Levins, MD [phone]

## 2023-01-25 ENCOUNTER — Other Ambulatory Visit: Payer: Self-pay | Admitting: Hematology

## 2023-01-25 ENCOUNTER — Other Ambulatory Visit: Payer: Self-pay | Admitting: Internal Medicine

## 2023-02-15 ENCOUNTER — Ambulatory Visit (INDEPENDENT_AMBULATORY_CARE_PROVIDER_SITE_OTHER): Payer: Medicare Other | Admitting: Internal Medicine

## 2023-02-15 ENCOUNTER — Encounter: Payer: Self-pay | Admitting: Internal Medicine

## 2023-02-15 VITALS — BP 126/68 | HR 55 | Temp 98.2°F | Ht 67.0 in

## 2023-02-15 DIAGNOSIS — E039 Hypothyroidism, unspecified: Secondary | ICD-10-CM

## 2023-02-15 DIAGNOSIS — J309 Allergic rhinitis, unspecified: Secondary | ICD-10-CM

## 2023-02-15 DIAGNOSIS — E559 Vitamin D deficiency, unspecified: Secondary | ICD-10-CM | POA: Diagnosis not present

## 2023-02-15 DIAGNOSIS — E782 Mixed hyperlipidemia: Secondary | ICD-10-CM | POA: Diagnosis not present

## 2023-02-15 DIAGNOSIS — E538 Deficiency of other specified B group vitamins: Secondary | ICD-10-CM

## 2023-02-15 DIAGNOSIS — R7302 Impaired glucose tolerance (oral): Secondary | ICD-10-CM | POA: Diagnosis not present

## 2023-02-15 DIAGNOSIS — I10 Essential (primary) hypertension: Secondary | ICD-10-CM

## 2023-02-15 LAB — BASIC METABOLIC PANEL
BUN: 15 mg/dL (ref 6–23)
CO2: 27 meq/L (ref 19–32)
Calcium: 9.2 mg/dL (ref 8.4–10.5)
Chloride: 104 meq/L (ref 96–112)
Creatinine, Ser: 0.97 mg/dL (ref 0.40–1.20)
GFR: 51.91 mL/min — ABNORMAL LOW (ref >60.00)
Glucose, Bld: 139 mg/dL — ABNORMAL HIGH (ref 70–99)
Potassium: 3.8 meq/L (ref 3.5–5.1)
Sodium: 139 meq/L (ref 135–145)

## 2023-02-15 LAB — HEMOGLOBIN A1C: Hgb A1c MFr Bld: 6.8 % — ABNORMAL HIGH (ref 4.6–6.5)

## 2023-02-15 LAB — CBC WITH DIFFERENTIAL/PLATELET
Basophils Absolute: 0.1 10*3/uL (ref 0.0–0.1)
Basophils Relative: 1 % (ref 0.0–3.0)
Eosinophils Absolute: 0.1 10*3/uL (ref 0.0–0.7)
Eosinophils Relative: 1.4 % (ref 0.0–5.0)
HCT: 35.3 % — ABNORMAL LOW (ref 36.0–46.0)
Hemoglobin: 11.6 g/dL — ABNORMAL LOW (ref 12.0–15.0)
Lymphocytes Relative: 29 % (ref 12.0–46.0)
Lymphs Abs: 1.6 10*3/uL (ref 0.7–4.0)
MCHC: 32.8 g/dL (ref 30.0–36.0)
MCV: 84.9 fL (ref 78.0–100.0)
Monocytes Absolute: 0.4 10*3/uL (ref 0.1–1.0)
Monocytes Relative: 7.2 % (ref 3.0–12.0)
Neutro Abs: 3.5 10*3/uL (ref 1.4–7.7)
Neutrophils Relative %: 61.4 % (ref 43.0–77.0)
Platelets: 304 10*3/uL (ref 150.0–400.0)
RBC: 4.16 Mil/uL (ref 3.87–5.11)
RDW: 15.1 % (ref 11.5–15.5)
WBC: 5.7 10*3/uL (ref 4.0–10.5)

## 2023-02-15 LAB — VITAMIN D 25 HYDROXY (VIT D DEFICIENCY, FRACTURES): VITD: 83.98 ng/mL (ref 30.00–100.00)

## 2023-02-15 LAB — HEPATIC FUNCTION PANEL
ALT: 10 U/L (ref 0–35)
AST: 15 U/L (ref 0–37)
Albumin: 3.8 g/dL (ref 3.5–5.2)
Alkaline Phosphatase: 91 U/L (ref 39–117)
Bilirubin, Direct: 0.1 mg/dL (ref 0.0–0.3)
Total Bilirubin: 0.7 mg/dL (ref 0.2–1.2)
Total Protein: 6.8 g/dL (ref 6.0–8.3)

## 2023-02-15 NOTE — Assessment & Plan Note (Signed)
BP Readings from Last 3 Encounters:  02/15/23 126/68  01/12/23 (!) 134/55  09/05/22 (!) 171/54   Stable, pt to continue medical treatment norvasc 5 every day, clonidine 0.3 bid

## 2023-02-15 NOTE — Patient Instructions (Signed)

## 2023-02-15 NOTE — Assessment & Plan Note (Signed)
Mild to mod, for start otc claritin 10 every day and/or nasacort asd,  to f/u any worsening symptoms or concerns

## 2023-02-15 NOTE — Assessment & Plan Note (Signed)
Lab Results  Component Value Date   HGBA1C 6.5 08/20/2022   Stable, pt to continue current medical treatment  - diet, wt control

## 2023-02-15 NOTE — Progress Notes (Signed)
Patient ID: Deanna Wall, female   DOB: 22-Apr-1931, 89 y.o.   MRN: [id number]        Chief Complaint: follow up HTN, HLD and hyperglycemia , low thyroid, low vit d      HPI:  Deanna Wall is a 89 y.o. female here overall doing ok,  Pt denies chest pain, increased sob or doe, wheezing, orthopnea, PND, increased LE swelling, palpitations, dizziness or syncope.   Pt denies polydipsia, polyuria, or new focal neuro s/s.    Pt denies fever, wt loss, night sweats, loss of appetite, or other constitutional symptoms Denies hyper or hypo thyroid symptoms such as voice, skin or hair change. Remains wheelchair bound but can manage cooking in the kitchen well, but does not need help with toileting, and sponge baths.  Does have several wks ongoing nasal allergy symptoms with clearish congestion, itch and sneezing, without fever, pain, ST, cough, swelling or wheezing.       Wt Readings from Last 3 Encounters:  08/05/22 250 lb (113.4 kg)  07/29/22 250 lb (113.4 kg)  05/13/20 203 lb 11.3 oz (92.4 kg)   BP Readings from Last 3 Encounters:  02/15/23 126/68  01/12/23 (!) 134/55  09/05/22 (!) 171/54         Past Medical History:  Diagnosis Date   ANEMIA, PERNICIOUS 01/30/2009   Breast cancer (HCC)    Cataracts, bilateral    DEGENERATIVE DISC DISEASE 10/28/2006   ECZEMA 10/28/2006   Gait disorder    Glaucoma    HYPERLIPIDEMIA 02/16/2010   HYPERTENSION 10/28/2006   Hypothyroidism    Impaired glucose tolerance 12/31/2010   MS (multiple sclerosis) (HCC)    Multiple sclerosis (HCC) 10/28/2006   Obesity    OSTEOARTHRITIS, KNEES, BILATERAL, SEVERE 06/14/2009   Sebaceous cyst    chest    VITAMIN B12 DEFICIENCY 06/14/2009   Past Surgical History:  Procedure Laterality Date   BREAST LUMPECTOMY WITH RADIOACTIVE SEED LOCALIZATION Right 08/05/2022   Procedure: RIGHT BREAST LUMPECTOMY WITH RADIOACTIVE SEED LOCALIZATION;  Surgeon: Griselda Miner, MD;  Location: [facility] OR;  Service: General;  Laterality: Right;    COLONOSCOPY     ORIF FEMUR FRACTURE Right 05/13/2020   Procedure: OPEN REDUCTION INTERNAL FIXATION (ORIF) DISTAL FEMUR FRACTURE;  Surgeon: Juanell Fairly, MD;  Location: [facility] ORS;  Service: Orthopedics;  Laterality: Right;   TUBAL LIGATION  1958   UPPER GASTROINTESTINAL ENDOSCOPY      reports that she has never smoked. She has never used smokeless tobacco. She reports that she does not drink alcohol and does not use drugs. family history includes Arthritis in her mother and sister; Bone cancer in her mother; Diabetes in her sister; Prostate cancer in her cousin; Stomach cancer in her maternal aunt. Allergies  Allergen Reactions   Atorvastatin     REACTION: feels funny in face and head   Current Outpatient Medications on File Prior to Visit  Medication Sig Dispense Refill   amLODipine (NORVASC) 5 MG tablet Take 1 tablet by mouth once daily 90 tablet 2   aspirin 81 MG tablet Take 81 mg by mouth daily.     cholecalciferol (VITAMIN D3) 25 MCG (1000 UNIT) tablet Take 2,000 Units by mouth daily.     citalopram (CELEXA) 10 MG tablet Take 1 tablet by mouth once daily 90 tablet 2   cloNIDine (CATAPRES) 0.3 MG tablet Take 1 tablet by mouth twice daily (Patient taking differently: Take 0.3 mg by mouth 2 (two) times daily.) 180 tablet 3  diclofenac sodium (VOLTAREN) 1 % GEL Apply 2 g topically 4 (four) times daily as needed. 200 g 5   dorzolamide (TRUSOPT) 2 % ophthalmic solution 1 drop 2 (two) times daily.     exemestane (AROMASIN) 25 MG tablet TAKE 1 TABLET BY MOUTH ONCE DAILY AFTER BREAKFAST 30 tablet 0   ezetimibe (ZETIA) 10 MG tablet Take 1 tablet by mouth once daily 90 tablet 3   furosemide (LASIX) 20 MG tablet TAKE 1 TABLET BY MOUTH ONCE DAILY AS NEEDED FOR  SWELLING (Patient taking differently: Take 20 mg by mouth daily.) 90 tablet 2   latanoprost (XALATAN) 0.005 % ophthalmic solution Place 1 drop into both eyes at bedtime.  3   levothyroxine (SYNTHROID) 25 MCG tablet Take 1 tablet by  mouth once daily 90 tablet 3   nitrofurantoin (MACRODANTIN) 50 MG capsule Take 1 capsule by mouth once daily 30 capsule 5   oxyCODONE (ROXICODONE) 5 MG immediate release tablet Take 1 tablet (5 mg total) by mouth every 6 (six) hours as needed for severe pain. 10 tablet 0   polyethylene glycol powder (GLYCOLAX/MIRALAX) 17 GM/SCOOP powder Take 17 g by mouth 2 (two) times daily as needed. 3350 g 1   potassium chloride (KLOR-CON) 10 MEQ tablet Take 1 tablet (10 mEq total) by mouth daily. 90 tablet 2   predniSONE (DELTASONE) 50 MG tablet Take one tablet by mouth once daily for 5 days. 5 tablet 0   vitamin B-12 (CYANOCOBALAMIN) 500 MCG tablet Take 500 mcg by mouth daily.     No current facility-administered medications on file prior to visit.        ROS:  All others reviewed and negative.  Objective        PE:  BP 126/68 (BP Location: Right Arm, Patient Position: Sitting, Cuff Size: Normal)   Pulse (!) 55   Temp 98.2 F (36.8 C) (Oral)   Ht 5\' 7"  (1.702 m)   SpO2 98%   BMI 39.16 kg/m                 Constitutional: Pt appears in NAD               HENT: Head: NCAT.                Right Ear: External ear normal.                 Left Ear: External ear normal.                Eyes: . Pupils are equal, round, and reactive to light. Conjunctivae and EOM are normal               Nose: without d/c or deformity               Neck: Neck supple. Gross normal ROM               Cardiovascular: Normal rate and regular rhythm.                 Pulmonary/Chest: Effort normal and breath sounds without rales or wheezing.                Abd:  Soft, NT, ND, + BS, no organomegaly               Neurological: Pt is alert. At baseline orientation, motor grossly intact               Skin: Skin is warm.  No rashes, no other new lesions, LE edema - none               Psychiatric: Pt behavior is normal without agitation   Micro: none  Cardiac tracings I have personally interpreted today:  none  Pertinent  Radiological findings (summarize): none   Lab Results  Component Value Date   WBC 8.9 09/05/2022   HGB 11.9 (L) 09/05/2022   HCT 36.4 09/05/2022   PLT 359 09/05/2022   GLUCOSE 115 (H) 09/05/2022   CHOL 170 08/20/2022   TRIG 99.0 08/20/2022   HDL 48.50 08/20/2022   LDLDIRECT 180.0 01/29/2010   LDLCALC 101 (H) 08/20/2022   ALT 11 08/20/2022   AST 15 08/20/2022   NA 141 09/05/2022   K 3.5 09/05/2022   CL 106 09/05/2022   CREATININE 0.88 09/05/2022   BUN 11 09/05/2022   CO2 27 09/05/2022   TSH 3.19 08/20/2022   INR 1.2 05/13/2020   HGBA1C 6.5 08/20/2022   MICROALBUR <0.7 08/20/2021   Assessment/Plan:  Deanna Wall is a 89 y.o. Black or African American [2] female with  has a past medical history of ANEMIA, PERNICIOUS (01/30/2009), Breast cancer (HCC), Cataracts, bilateral, DEGENERATIVE DISC DISEASE (10/28/2006), ECZEMA (10/28/2006), Gait disorder, Glaucoma, HYPERLIPIDEMIA (02/16/2010), HYPERTENSION (10/28/2006), Hypothyroidism, Impaired glucose tolerance (12/31/2010), MS (multiple sclerosis) (HCC), Multiple sclerosis (HCC) (10/28/2006), Obesity, OSTEOARTHRITIS, KNEES, BILATERAL, SEVERE (06/14/2009), Sebaceous cyst, and VITAMIN B12 DEFICIENCY (06/14/2009).  Vitamin D deficiency Last vitamin D Lab Results  Component Value Date   VD25OH 70.92 08/20/2022   Stable, cont oral replacement   Impaired glucose tolerance Lab Results  Component Value Date   HGBA1C 6.5 08/20/2022   Stable, pt to continue current medical treatment  - diet,w t control   Hypothyroidism Lab Results  Component Value Date   TSH 3.19 08/20/2022   Stable, pt to continue levothyroxine 25 mcg qd   Hyperlipidemia Lab Results  Component Value Date   LDLCALC 101 (H) 08/20/2022   Uncontrolled, goal ldl < 70, pt to continue DM low chol diet, declines statin for now, for f/u la today, consider zetia   Essential hypertension BP Readings from Last 3 Encounters:  02/15/23 126/68  01/12/23 (!) 134/55   09/05/22 (!) 171/54   Stable, pt to continue medical treatment norvasc 5 every day, clonidine 0.3 bid   B12 deficiency Lab Results  Component Value Date   VITAMINB12 1,136 (H) 08/20/2022   Stable, cont oral replacement - b12 1000 mcg qd   Allergic rhinitis Mild to mod, for start otc claritin 10 every day and/or nasacort asd,  to f/u any worsening symptoms or concerns  Followup: Return in about 6 months (around 08/15/2023).  Oliver Barre, MD 02/15/2023 12:12 PM [facility]

## 2023-02-15 NOTE — Assessment & Plan Note (Signed)
Lab Results  Component Value Date   LDLCALC 101 (H) 08/20/2022   Uncontrolled, goal ldl < 70, pt to continue DM low chol diet, declines statin for now, for f/u la today, consider zetia

## 2023-02-15 NOTE — Assessment & Plan Note (Signed)
Last vitamin D Lab Results  Component Value Date   VD25OH 70.92 08/20/2022   Stable, cont oral replacement

## 2023-02-15 NOTE — Assessment & Plan Note (Signed)
Lab Results  Component Value Date   TSH 3.19 08/20/2022   Stable, pt to continue levothyroxine 25 mcg qd

## 2023-02-15 NOTE — Assessment & Plan Note (Signed)
Lab Results  Component Value Date   VITAMINB12 1,136 (H) 08/20/2022   Stable, cont oral replacement - b12 1000 mcg qd

## 2023-02-22 ENCOUNTER — Other Ambulatory Visit: Payer: Self-pay | Admitting: Hematology

## 2023-02-22 ENCOUNTER — Other Ambulatory Visit: Payer: Self-pay

## 2023-02-22 ENCOUNTER — Other Ambulatory Visit: Payer: Self-pay | Admitting: Internal Medicine

## 2023-02-23 DIAGNOSIS — C50211 Malignant neoplasm of upper-inner quadrant of right female breast: Secondary | ICD-10-CM | POA: Diagnosis not present

## 2023-02-23 DIAGNOSIS — Z170 Estrogen receptor positive status [ER+]: Secondary | ICD-10-CM | POA: Diagnosis not present

## 2023-02-25 ENCOUNTER — Other Ambulatory Visit: Payer: Medicare Other

## 2023-02-25 ENCOUNTER — Ambulatory Visit: Payer: Medicare Other | Admitting: Hematology

## 2023-03-22 ENCOUNTER — Other Ambulatory Visit: Payer: Self-pay | Admitting: Hematology

## 2023-04-07 ENCOUNTER — Ambulatory Visit: Payer: Medicare Other | Admitting: Hematology

## 2023-04-07 ENCOUNTER — Other Ambulatory Visit: Payer: Medicare Other

## 2023-04-09 ENCOUNTER — Other Ambulatory Visit: Payer: Self-pay

## 2023-04-09 DIAGNOSIS — Z170 Estrogen receptor positive status [ER+]: Secondary | ICD-10-CM

## 2023-04-11 NOTE — Assessment & Plan Note (Signed)
pT1cN0M0, stage IA, G2, ER+/PR+/HER2- -this was discovered on screening mammogram -She underwent right breast lumpectomy on Aug 05, 2022.  I reviewed her surgical pathology findings, which showed a 1.2 cm invasive ductal carcinoma, with positive lymphovascular invasion, grade 2, negative margins. -Due to her advanced age and medical comorbidities, Oncotype was not recommended -Due to her advanced age, immobility from MS, she is not a candidate for adjuvant radiation  ---she started adjuvant exemestane in 08/2022, she is tolerating well, no side effects  -Given her advanced age, and her medical comorbidities, if she has side effects from antiestrogen therapy, I have a low threshold to stop it.  If she decided not to pursue, it is also reasonable.

## 2023-04-12 ENCOUNTER — Inpatient Hospital Stay: Payer: Medicare Other | Admitting: Hematology

## 2023-04-12 ENCOUNTER — Inpatient Hospital Stay: Payer: Medicare Other | Attending: Hematology

## 2023-04-12 ENCOUNTER — Encounter: Payer: Self-pay | Admitting: Hematology

## 2023-04-12 VITALS — BP 137/57 | HR 50 | Temp 98.6°F | Resp 19

## 2023-04-12 DIAGNOSIS — C50412 Malignant neoplasm of upper-outer quadrant of left female breast: Secondary | ICD-10-CM | POA: Diagnosis not present

## 2023-04-12 DIAGNOSIS — C50411 Malignant neoplasm of upper-outer quadrant of right female breast: Secondary | ICD-10-CM | POA: Diagnosis not present

## 2023-04-12 DIAGNOSIS — Z170 Estrogen receptor positive status [ER+]: Secondary | ICD-10-CM | POA: Insufficient documentation

## 2023-04-12 DIAGNOSIS — G35 Multiple sclerosis: Secondary | ICD-10-CM | POA: Insufficient documentation

## 2023-04-12 DIAGNOSIS — Z79811 Long term (current) use of aromatase inhibitors: Secondary | ICD-10-CM | POA: Diagnosis not present

## 2023-04-12 DIAGNOSIS — M19042 Primary osteoarthritis, left hand: Secondary | ICD-10-CM | POA: Diagnosis not present

## 2023-04-12 LAB — CBC WITH DIFFERENTIAL (CANCER CENTER ONLY)
Abs Immature Granulocytes: 0.01 10*3/uL (ref 0.00–0.07)
Basophils Absolute: 0 10*3/uL (ref 0.0–0.1)
Basophils Relative: 1 %
Eosinophils Absolute: 0.1 10*3/uL (ref 0.0–0.5)
Eosinophils Relative: 1 %
HCT: 35.4 % — ABNORMAL LOW (ref 36.0–46.0)
Hemoglobin: 11.7 g/dL — ABNORMAL LOW (ref 12.0–15.0)
Immature Granulocytes: 0 %
Lymphocytes Relative: 30 %
Lymphs Abs: 1.6 10*3/uL (ref 0.7–4.0)
MCH: 27.8 pg (ref 26.0–34.0)
MCHC: 33.1 g/dL (ref 30.0–36.0)
MCV: 84.1 fL (ref 80.0–100.0)
Monocytes Absolute: 0.4 10*3/uL (ref 0.1–1.0)
Monocytes Relative: 7 %
Neutro Abs: 3.2 10*3/uL (ref 1.7–7.7)
Neutrophils Relative %: 61 %
Platelet Count: 301 10*3/uL (ref 150–400)
RBC: 4.21 MIL/uL (ref 3.87–5.11)
RDW: 14.7 % (ref 11.5–15.5)
WBC Count: 5.3 10*3/uL (ref 4.0–10.5)
nRBC: 0 % (ref 0.0–0.2)

## 2023-04-12 LAB — CMP (CANCER CENTER ONLY)
ALT: 19 U/L (ref 0–44)
AST: 21 U/L (ref 15–41)
Albumin: 3.8 g/dL (ref 3.5–5.0)
Alkaline Phosphatase: 96 U/L (ref 38–126)
Anion gap: 6 (ref 5–15)
BUN: 16 mg/dL (ref 8–23)
CO2: 27 mmol/L (ref 22–32)
Calcium: 9.2 mg/dL (ref 8.9–10.3)
Chloride: 106 mmol/L (ref 98–111)
Creatinine: 0.98 mg/dL (ref 0.44–1.00)
GFR, Estimated: 55 mL/min — ABNORMAL LOW (ref >60)
Glucose, Bld: 149 mg/dL — ABNORMAL HIGH (ref 70–99)
Potassium: 3.8 mmol/L (ref 3.5–5.1)
Sodium: 139 mmol/L (ref 135–145)
Total Bilirubin: 0.8 mg/dL (ref 0.0–1.2)
Total Protein: 6.7 g/dL (ref 6.5–8.1)

## 2023-04-12 MED ORDER — EXEMESTANE 25 MG PO TABS: 25.0000 mg | ORAL_TABLET | Freq: Every day | ORAL | 5 refills | Status: DC

## 2023-04-12 NOTE — Progress Notes (Signed)
[facility]   [phone] [phone]   Clinic Follow up Note   Patient Care Team: Corwin Levins, MD as PCP - General [facility] as Consulting Physician (Ophthalmology) Donnelly Angelica, RN as Oncology Nurse Navigator Pershing Proud, RN as Oncology Nurse Navigator Malachy Mood, MD as Consulting Physician (Hematology) Antony Blackbird, MD as Consulting Physician (Radiation Oncology) Griselda Miner, MD as Consulting Physician (General Surgery) Pollyann Samples, NP as Nurse Practitioner (Nurse Practitioner)  Date of Service:  04/12/2023  CHIEF COMPLAINT: f/u of right breast cancer  CURRENT THERAPY:  Adjuvant exemestane  Oncology History   Malignant neoplasm of upper-outer quadrant of right breast in female, estrogen receptor positive (HCC) pT1cN0M0, stage IA, G2, ER+/PR+/HER2- -this was discovered on screening mammogram -She underwent right breast lumpectomy on Aug 05, 2022.  I reviewed her surgical pathology findings, which showed a 1.2 cm invasive ductal carcinoma, with positive lymphovascular invasion, grade 2, negative margins. -Due to her advanced age and medical comorbidities, Oncotype was not recommended -Due to her advanced age, immobility from MS, she is not a candidate for adjuvant radiation  ---she started adjuvant exemestane in 08/2022, she is tolerating well, no side effects  -Given her advanced age, and her medical comorbidities, if she has side effects from antiestrogen therapy, I have a low threshold to stop it.  If she decided not to pursue, it is also reasonable.   Assessment and Plan    Breast Cancer Currently on adjuvant exemestane for breast cancer, tolerating well with no new symptoms or significant side effects. Mild anemia likely secondary to other conditions. Last mammogram in May 2024, next scheduled for April 2025. Medication aims to prevent recurrence, to be continued for up to five years. Advised to report  joint pain or fatigue. - Refill exemestane - Schedule follow-up in six months - Monitor for joint pain, fatigue, or other side effects - Continue with scheduled mammogram in April 2025  Multiple Sclerosis (MS) Chronic condition contributing to mobility issues. No new symptoms or exacerbations reported. - Continue current management - Monitor for new symptoms or exacerbations  Arthritis Chronic arthritis contributing to mobility issues, managed with Tylenol for hand pain. - Continue Tylenol as needed - Monitor for worsening symptoms  General Health Maintenance Routine health maintenance discussed. Blood tests show mild anemia. Liver function test results pending. - Review liver function test results when available - Routine follow-up in six months  Plan -Lab reviewed, she is clinically doing well.  Will continue exemestane - Schedule follow-up appointment in six months with Wylene Men.         SUMMARY OF ONCOLOGIC HISTORY: Oncology History Overview Note   Cancer Staging  Malignant neoplasm of upper-outer quadrant of right breast in female, estrogen receptor positive Staging form: Breast, AJCC 8th Edition - Clinical stage from 07/08/2022: Stage IA (cT1c, cN0, cM0, G2, ER+, PR+, HER2-) - Signed by Malachy Mood, MD on 07/14/2022 Stage prefix: Initial diagnosis Histologic grading system: 3 grade system     Malignant neoplasm of upper-outer quadrant of right breast in female, estrogen receptor positive (HCC)  07/08/2022 Cancer Staging   Staging form: Breast, AJCC 8th Edition - Clinical stage from 07/08/2022: Stage IA (cT1c, cN0, cM0, G2, ER+, PR+, HER2-) - Signed by Malachy Mood, MD on 07/14/2022 Stage prefix: Initial diagnosis Histologic grading system: 3 grade system   07/13/2022 Initial Diagnosis   Malignant neoplasm of upper-outer quadrant of right breast in female, estrogen receptor positive   08/05/2022  Cancer Staging   Staging form: Breast, AJCC 8th Edition - Pathologic stage from  08/05/2022: Stage Unknown (pT1c, pNX, cM0, G2, ER+, PR+, HER2-) - Signed by Malachy Mood, MD on 11/26/2022 Histologic grading system: 3 grade system Residual tumor (R): R0 - None   08/2022 -  Anti-estrogen oral therapy   Began adjuvant anti-estrogen therapy with Exemestane   01/12/2023 Survivorship   SCP delivered by Santiago Glad, NP      Discussed the use of AI scribe software for clinical note transcription with the patient, who gave verbal consent to proceed.  History of Present Illness   The patient, an 89 year old with a history of breast cancer, arthritis, and multiple sclerosis, presents for a routine follow-up. She reports no new health issues in the past three to four months and denies any breast pain. She continues to take her prescribed breast cancer medication, which occasionally causes a feeling of needing to use the bathroom but is otherwise well-tolerated. The patient reports occasional hot flashes but does not consider them significant. Despite her health conditions, she is able to take care of herself with some assistance from her daughter. The patient is wheelchair-bound due to arthritis and MS. She has a scheduled mammogram in April as part of her regular screening. The patient also mentions arthritis-related pain in her hands, which she manages with Tylenol.         All other systems were reviewed with the patient and are negative.  MEDICAL HISTORY:  Past Medical History:  Diagnosis Date   ANEMIA, PERNICIOUS 01/30/2009   Breast cancer (HCC)    Cataracts, bilateral    DEGENERATIVE DISC DISEASE 10/28/2006   ECZEMA 10/28/2006   Gait disorder    Glaucoma    HYPERLIPIDEMIA 02/16/2010   HYPERTENSION 10/28/2006   Hypothyroidism    Impaired glucose tolerance 12/31/2010   MS (multiple sclerosis) (HCC)    Multiple sclerosis (HCC) 10/28/2006   Obesity    OSTEOARTHRITIS, KNEES, BILATERAL, SEVERE 06/14/2009   Sebaceous cyst    chest    VITAMIN B12 DEFICIENCY 06/14/2009     SURGICAL HISTORY: Past Surgical History:  Procedure Laterality Date   BREAST LUMPECTOMY WITH RADIOACTIVE SEED LOCALIZATION Right 08/05/2022   Procedure: RIGHT BREAST LUMPECTOMY WITH RADIOACTIVE SEED LOCALIZATION;  Surgeon: Griselda Miner, MD;  Location: [facility] OR;  Service: General;  Laterality: Right;   COLONOSCOPY     ORIF FEMUR FRACTURE Right 05/13/2020   Procedure: OPEN REDUCTION INTERNAL FIXATION (ORIF) DISTAL FEMUR FRACTURE;  Surgeon: Juanell Fairly, MD;  Location: [facility] ORS;  Service: Orthopedics;  Laterality: Right;   TUBAL LIGATION  1958   UPPER GASTROINTESTINAL ENDOSCOPY      I have reviewed the social history and family history with the patient and they are unchanged from previous note.  ALLERGIES:  is allergic to atorvastatin.  MEDICATIONS:  Current Outpatient Medications  Medication Sig Dispense Refill   amLODipine (NORVASC) 5 MG tablet Take 1 tablet by mouth once daily 90 tablet 3   aspirin 81 MG tablet Take 81 mg by mouth daily.     cholecalciferol (VITAMIN D3) 25 MCG (1000 UNIT) tablet Take 2,000 Units by mouth daily.     citalopram (CELEXA) 10 MG tablet Take 1 tablet by mouth once daily 90 tablet 3   cloNIDine (CATAPRES) 0.3 MG tablet Take 1 tablet by mouth twice daily 180 tablet 3   diclofenac sodium (VOLTAREN) 1 % GEL Apply 2 g topically 4 (four) times daily as needed. 200 g 5   dorzolamide (  TRUSOPT) 2 % ophthalmic solution 1 drop 2 (two) times daily.     exemestane (AROMASIN) 25 MG tablet Take 1 tablet (25 mg total) by mouth daily after breakfast. 30 tablet 5   ezetimibe (ZETIA) 10 MG tablet Take 1 tablet by mouth once daily 90 tablet 3   furosemide (LASIX) 20 MG tablet TAKE 1 TABLET BY MOUTH ONCE DAILY AS NEEDED FOR  SWELLING (Patient taking differently: Take 20 mg by mouth daily.) 90 tablet 2   latanoprost (XALATAN) 0.005 % ophthalmic solution Place 1 drop into both eyes at bedtime.  3   levothyroxine (SYNTHROID) 25 MCG tablet Take 1 tablet by mouth once daily 90  tablet 3   nitrofurantoin (MACRODANTIN) 50 MG capsule Take 1 capsule by mouth once daily 30 capsule 5   oxyCODONE (ROXICODONE) 5 MG immediate release tablet Take 1 tablet (5 mg total) by mouth every 6 (six) hours as needed for severe pain. 10 tablet 0   polyethylene glycol powder (GLYCOLAX/MIRALAX) 17 GM/SCOOP powder Take 17 g by mouth 2 (two) times daily as needed. 3350 g 1   potassium chloride (KLOR-CON) 10 MEQ tablet Take 1 tablet (10 mEq total) by mouth daily. 90 tablet 2   predniSONE (DELTASONE) 50 MG tablet Take one tablet by mouth once daily for 5 days. 5 tablet 0   vitamin B-12 (CYANOCOBALAMIN) 500 MCG tablet Take 500 mcg by mouth daily.     No current facility-administered medications for this visit.    PHYSICAL EXAMINATION: ECOG PERFORMANCE STATUS: 2  Vitals:   04/12/23 0950  BP: (!) 137/57  Pulse: (!) 50  Resp: 19  Temp: 98.6 F (37 C)  SpO2: 96%   Wt Readings from Last 3 Encounters:  08/05/22 250 lb (113.4 kg)  07/29/22 250 lb (113.4 kg)  05/13/20 203 lb 11.3 oz (92.4 kg)     GENERAL:alert, no distress and comfortable SKIN: skin color, texture, turgor are normal, no rashes or significant lesions EYES: normal, Conjunctiva are pink and non-injected, sclera clear NECK: supple, thyroid normal size, non-tender, without nodularity LYMPH:  no palpable lymphadenopathy in the cervical, axillary  LUNGS: clear to auscultation and percussion with normal breathing effort HEART: regular rate & rhythm and no murmurs and no lower extremity edema ABDOMEN:abdomen soft, non-tender and normal bowel sounds Musculoskeletal:no cyanosis of digits and no clubbing  NEURO: alert & oriented x 3 with fluent speech, no focal motor/sensory deficits Breasts: Breast inspection showed them to be symmetrical with no nipple discharge. Palpation of the breasts and axilla revealed no obvious mass that I could appreciate.   LABORATORY DATA:  I have reviewed the data as listed    Latest Ref Rng &  Units 04/12/2023    9:38 AM 02/15/2023   11:21 AM 09/05/2022   12:36 PM  CBC  WBC 4.0 - 10.5 K/uL 5.3  5.7  8.9   Hemoglobin 12.0 - 15.0 g/dL 60.4  54.0  98.1   Hematocrit 36.0 - 46.0 % 35.4  35.3  36.4   Platelets 150 - 400 K/uL 301  304.0  359         Latest Ref Rng & Units 04/12/2023    9:38 AM 02/15/2023   11:21 AM 09/05/2022   12:36 PM  CMP  Glucose 70 - 99 mg/dL 191  478  295   BUN 8 - 23 mg/dL 16  15  11    Creatinine 0.44 - 1.00 mg/dL 6.21  3.08  6.57   Sodium 135 - 145 mmol/L 139  139  141   Potassium 3.5 - 5.1 mmol/L 3.8  3.8  3.5   Chloride 98 - 111 mmol/L 106  104  106   CO2 22 - 32 mmol/L 27  27  27    Calcium 8.9 - 10.3 mg/dL 9.2  9.2  9.2   Total Protein 6.5 - 8.1 g/dL 6.7  6.8    Total Bilirubin 0.0 - 1.2 mg/dL 0.8  0.7    Alkaline Phos 38 - 126 U/L 96  91    AST 15 - 41 U/L 21  15    ALT 0 - 44 U/L 19  10        RADIOGRAPHIC STUDIES: I have personally reviewed the radiological images as listed and agreed with the findings in the report. No results found.    Orders Placed This Encounter  Procedures   MM 3D DIAGNOSTIC MAMMOGRAM BILATERAL BREAST    Standing Status:   Future    Expected Date:   06/26/2023    Expiration Date:   04/11/2024    Scheduling Instructions:     [facility]    Reason for Exam (SYMPTOM  OR DIAGNOSIS REQUIRED):   SCREENING    Preferred imaging location?:   External   All questions were answered. The patient knows to call the clinic with any problems, questions or concerns. No barriers to learning was detected. The total time spent in the appointment was 25 minutes.     Malachy Mood, MD 04/12/2023

## 2023-06-10 DIAGNOSIS — H401232 Low-tension glaucoma, bilateral, moderate stage: Secondary | ICD-10-CM | POA: Diagnosis not present

## 2023-06-10 DIAGNOSIS — Z961 Presence of intraocular lens: Secondary | ICD-10-CM | POA: Diagnosis not present

## 2023-06-23 ENCOUNTER — Other Ambulatory Visit: Payer: Self-pay | Admitting: Internal Medicine

## 2023-06-23 ENCOUNTER — Other Ambulatory Visit: Payer: Self-pay

## 2023-07-19 DIAGNOSIS — Z853 Personal history of malignant neoplasm of breast: Secondary | ICD-10-CM | POA: Diagnosis not present

## 2023-07-19 DIAGNOSIS — Z08 Encounter for follow-up examination after completed treatment for malignant neoplasm: Secondary | ICD-10-CM | POA: Diagnosis not present

## 2023-07-19 LAB — HM MAMMOGRAPHY

## 2023-07-20 ENCOUNTER — Encounter: Payer: Self-pay | Admitting: Internal Medicine

## 2023-07-23 ENCOUNTER — Other Ambulatory Visit: Payer: Self-pay

## 2023-07-23 ENCOUNTER — Other Ambulatory Visit: Payer: Self-pay | Admitting: Internal Medicine

## 2023-08-09 DIAGNOSIS — H401232 Low-tension glaucoma, bilateral, moderate stage: Secondary | ICD-10-CM | POA: Diagnosis not present

## 2023-08-17 ENCOUNTER — Encounter: Payer: Self-pay | Admitting: Internal Medicine

## 2023-08-17 ENCOUNTER — Ambulatory Visit (INDEPENDENT_AMBULATORY_CARE_PROVIDER_SITE_OTHER): Payer: Medicare Other | Admitting: Internal Medicine

## 2023-08-17 VITALS — BP 148/82 | HR 130 | Temp 98.1°F | Ht 67.0 in

## 2023-08-17 DIAGNOSIS — R7302 Impaired glucose tolerance (oral): Secondary | ICD-10-CM

## 2023-08-17 DIAGNOSIS — E538 Deficiency of other specified B group vitamins: Secondary | ICD-10-CM | POA: Diagnosis not present

## 2023-08-17 DIAGNOSIS — J4520 Mild intermittent asthma, uncomplicated: Secondary | ICD-10-CM | POA: Diagnosis not present

## 2023-08-17 DIAGNOSIS — E782 Mixed hyperlipidemia: Secondary | ICD-10-CM

## 2023-08-17 DIAGNOSIS — I10 Essential (primary) hypertension: Secondary | ICD-10-CM

## 2023-08-17 DIAGNOSIS — J45909 Unspecified asthma, uncomplicated: Secondary | ICD-10-CM | POA: Insufficient documentation

## 2023-08-17 DIAGNOSIS — Z0001 Encounter for general adult medical examination with abnormal findings: Secondary | ICD-10-CM

## 2023-08-17 LAB — POCT GLYCOSYLATED HEMOGLOBIN (HGB A1C): Hemoglobin A1C: 6.3 % — AB (ref 4.0–5.6)

## 2023-08-17 MED ORDER — ALBUTEROL SULFATE HFA 108 (90 BASE) MCG/ACT IN AERS: 2.0000 | INHALATION_SPRAY | Freq: Four times a day (QID) | RESPIRATORY_TRACT | 0 refills | Status: AC | PRN

## 2023-08-17 NOTE — Progress Notes (Signed)
 Patient ID: Deanna Wall, female   DOB: 05-Jul-1931, 89 y.o.   MRN: [id number]         Chief Complaint:: wellness exam and dm, htn, hld,new asthma, constipation       HPI:  Deanna Wall is a 89 y.o. female here for wellness exam with family; up to date as pt does not want DXA.                        Also remains wheelchair bound as unable to stand.  Has mild recent nasal allergy symptoms now with worsening mild intermittent sob and wheezing as well. Pt denies chest pain, orthopnea, PND, increased LE swelling, palpitations, dizziness or syncope.  A friends albuterol  hfa inhaler helped.  Denies worsening reflux, abd pain, dysphagia, n/v or blood but has mild intermittent constipation.  BP has been controlled at home, pt declines change today   Wt Readings from Last 3 Encounters:  08/05/22 250 lb (113.4 kg)  07/29/22 250 lb (113.4 kg)  05/13/20 203 lb 11.3 oz (92.4 kg)   BP Readings from Last 3 Encounters:  08/17/23 (!) 148/82  04/12/23 (!) 137/57  02/15/23 126/68   Immunization History  Administered Date(s) Administered   Fluad Quad(high Dose 65+) 02/17/2021, 01/10/2023   Influenza Split 12/31/2010, 12/07/2011   Influenza Whole 03/23/2005, 12/09/2007, 12/17/2008, 02/04/2010   Influenza, High Dose Seasonal PF 12/23/2016, 12/26/2018   Influenza,inj,Quad PF,6+ Mos 12/15/2012, 11/09/2013, 12/25/2014, 11/13/2015, 01/14/2018   Influenza-Unspecified 12/26/2019, 12/16/2021   Moderna Covid-19 Fall Seasonal Vaccine 35yrs & older 11/23/2022   PFIZER(Purple Top)SARS-COV-2 Vaccination 04/12/2019, 05/03/2019, 02/09/2020, 12/16/2021   Pneumococcal Conjugate-13 05/12/2013   Pneumococcal Polysaccharide-23 03/23/2005, 12/31/2010   Td 03/24/1999, 02/04/2010   Tdap 08/21/2022   Zoster Recombinant(Shingrix) 08/21/2022, 11/23/2022   Zoster, Live 12/09/2007   Health Maintenance Due  Topic Date Due   DEXA SCAN  Never done   Medicare Annual Wellness (AWV)  10/15/2023      Past Medical History:   Diagnosis Date   ANEMIA, PERNICIOUS 01/30/2009   Breast cancer (HCC)    Cataracts, bilateral    DEGENERATIVE DISC DISEASE 10/28/2006   ECZEMA 10/28/2006   Gait disorder    Glaucoma    HYPERLIPIDEMIA 02/16/2010   HYPERTENSION 10/28/2006   Hypothyroidism    Impaired glucose tolerance 12/31/2010   MS (multiple sclerosis) (HCC)    Multiple sclerosis (HCC) 10/28/2006   Obesity    OSTEOARTHRITIS, KNEES, BILATERAL, SEVERE 06/14/2009   Sebaceous cyst    chest    VITAMIN B12 DEFICIENCY 06/14/2009   Past Surgical History:  Procedure Laterality Date   BREAST LUMPECTOMY WITH RADIOACTIVE SEED LOCALIZATION Right 08/05/2022   Procedure: RIGHT BREAST LUMPECTOMY WITH RADIOACTIVE SEED LOCALIZATION;  Surgeon: Caralyn Chandler, MD;  Location: [facility] OR;  Service: General;  Laterality: Right;   COLONOSCOPY     ORIF FEMUR FRACTURE Right 05/13/2020   Procedure: OPEN REDUCTION INTERNAL FIXATION (ORIF) DISTAL FEMUR FRACTURE;  Surgeon: Rande Bushy, MD;  Location: [facility] ORS;  Service: Orthopedics;  Laterality: Right;   TUBAL LIGATION  1958   UPPER GASTROINTESTINAL ENDOSCOPY      reports that she has never smoked. She has never used smokeless tobacco. She reports that she does not drink alcohol  and does not use drugs. family history includes Arthritis in her mother and sister; Bone cancer in her mother; Diabetes in her sister; Prostate cancer in her cousin; Stomach cancer in her maternal aunt. Allergies  Allergen Reactions   Atorvastatin  REACTION: feels funny in face and head   Current Outpatient Medications on File Prior to Visit  Medication Sig Dispense Refill   amLODipine  (NORVASC ) 5 MG tablet Take 1 tablet by mouth once daily 90 tablet 3   aspirin  81 MG tablet Take 81 mg by mouth daily.     cholecalciferol (VITAMIN D3) 25 MCG (1000 UNIT) tablet Take 2,000 Units by mouth daily.     citalopram  (CELEXA ) 10 MG tablet Take 1 tablet by mouth once daily 90 tablet 3   cloNIDine  (CATAPRES ) 0.3 MG tablet  Take 1 tablet by mouth twice daily 180 tablet 3   diclofenac  sodium (VOLTAREN ) 1 % GEL Apply 2 g topically 4 (four) times daily as needed. 200 g 5   dorzolamide  (TRUSOPT ) 2 % ophthalmic solution 1 drop 2 (two) times daily.     dorzolamide -timolol (COSOPT) 2-0.5 % ophthalmic solution 1 drop 2 (two) times daily.     exemestane  (AROMASIN ) 25 MG tablet Take 1 tablet (25 mg total) by mouth daily after breakfast. 30 tablet 5   ezetimibe  (ZETIA ) 10 MG tablet Take 1 tablet by mouth once daily 90 tablet 3   furosemide  (LASIX ) 20 MG tablet TAKE 1 TABLET BY MOUTH ONCE DAILY AS NEEDED FOR SWELLING 90 tablet 0   latanoprost  (XALATAN ) 0.005 % ophthalmic solution Place 1 drop into both eyes at bedtime.  3   levothyroxine  (SYNTHROID ) 25 MCG tablet Take 1 tablet by mouth once daily 90 tablet 3   LUMIGAN 0.01 % SOLN SMARTSIG:In Eye(s)     nitrofurantoin  (MACRODANTIN ) 50 MG capsule Take 1 capsule by mouth once daily 30 capsule 5   oxyCODONE  (ROXICODONE ) 5 MG immediate release tablet Take 1 tablet (5 mg total) by mouth every 6 (six) hours as needed for severe pain. 10 tablet 0   polyethylene glycol powder (GLYCOLAX /MIRALAX ) 17 GM/SCOOP powder Take 17 g by mouth 2 (two) times daily as needed. 3350 g 1   potassium chloride  (KLOR-CON ) 10 MEQ tablet Take 1 tablet by mouth once daily 90 tablet 3   predniSONE  (DELTASONE ) 50 MG tablet Take one tablet by mouth once daily for 5 days. 5 tablet 0   vitamin B-12 (CYANOCOBALAMIN ) 500 MCG tablet Take 500 mcg by mouth daily.     No current facility-administered medications on file prior to visit.        ROS:  All others reviewed and negative.  Objective        PE:  BP (!) 148/82 (BP Location: Left Arm, Patient Position: Sitting, Cuff Size: Normal)   Pulse (!) 130   Temp 98.1 F (36.7 C) (Oral)   Ht 5\' 7"  (1.702 m)   SpO2 97%   BMI 39.16 kg/m                 Constitutional: Pt appears in NAD               HENT: Head: NCAT.                Right Ear: External ear  normal.                 Left Ear: External ear normal.                Eyes: . Pupils are equal, round, and reactive to light. Conjunctivae and EOM are normal               Nose: without d/c or deformity  Neck: Neck supple. Gross normal ROM               Cardiovascular: Normal rate and regular rhythm.                 Pulmonary/Chest: Effort normal and breath sounds without rales or wheezing.                Abd:  Soft, NT, ND, + BS, no organomegaly               Neurological: Pt is alert. At baseline orientation, motor grossly intact               Skin: Skin is warm. No rashes, no other new lesions, LE edema - trace bilateral               Psychiatric: Pt behavior is normal without agitation   Micro: none  Cardiac tracings I have personally interpreted today:  none  Pertinent Radiological findings (summarize): none   Lab Results  Component Value Date   WBC 5.3 04/12/2023   HGB 11.7 (L) 04/12/2023   HCT 35.4 (L) 04/12/2023   PLT 301 04/12/2023   GLUCOSE 149 (H) 04/12/2023   CHOL 170 08/20/2022   TRIG 99.0 08/20/2022   HDL 48.50 08/20/2022   LDLDIRECT 180.0 01/29/2010   LDLCALC 101 (H) 08/20/2022   ALT 19 04/12/2023   AST 21 04/12/2023   NA 139 04/12/2023   K 3.8 04/12/2023   CL 106 04/12/2023   CREATININE 0.98 04/12/2023   BUN 16 04/12/2023   CO2 27 04/12/2023   TSH 3.19 08/20/2022   INR 1.2 05/13/2020   HGBA1C 6.3 (A) 08/17/2023   MICROALBUR <0.7 08/20/2021   Assessment/Plan:  MARILOU BARNFIELD is a 89 y.o. Black or African American [2] female with  has a past medical history of ANEMIA, PERNICIOUS (01/30/2009), Breast cancer (HCC), Cataracts, bilateral, DEGENERATIVE DISC DISEASE (10/28/2006), ECZEMA (10/28/2006), Gait disorder, Glaucoma, HYPERLIPIDEMIA (02/16/2010), HYPERTENSION (10/28/2006), Hypothyroidism, Impaired glucose tolerance (12/31/2010), MS (multiple sclerosis) (HCC), Multiple sclerosis (HCC) (10/28/2006), Obesity, OSTEOARTHRITIS, KNEES, BILATERAL,  SEVERE (06/14/2009), Sebaceous cyst, and VITAMIN B12 DEFICIENCY (06/14/2009).  Encounter for well adult exam with abnormal findings Age and sex appropriate education and counseling updated with regular exercise and diet Referrals for preventative services - declines dxa Immunizations addressed - none needed Smoking counseling  - none needed Evidence for depression or other mood disorder - none significant Most recent labs reviewed. I have personally reviewed and have noted: 1) the patient's medical and social history 2) The patient's current medications and supplements 3) The patient's height, weight, and BMI have been recorded in the chart   B12 deficiency Lab Results  Component Value Date   VITAMINB12 1,136 (H) 08/20/2022   Stable, cont oral replacement - b12 1000 mcg qd   Hyperlipidemia Lab Results  Component Value Date   LDLCALC 101 (H) 08/20/2022   Mild uncontrolled, pt to continue zetia  10 mg and lower chol diet, decliens statin   Essential hypertension BP Readings from Last 3 Encounters:  08/17/23 (!) 148/82  04/12/23 (!) 137/57  02/15/23 126/68   Uncontrolled but pt states ok at home, pt to continue medical treatment , declines change today, norvasc  5 every day, clonidine  0.3 bid   Impaired glucose tolerance Lab Results  Component Value Date   HGBA1C 6.3 (A) 08/17/2023   Stable, pt to continue current medical treatment  - diet, wt control   Asthma With recent wheezing, ok for start albuterol  hfa  prn  Followup: Return in about 6 months (around 02/17/2024).  Rosalia Colonel, MD 08/17/2023 1:21 PM [facility]  Primary Care - [facility]

## 2023-08-17 NOTE — Assessment & Plan Note (Signed)
 With recent wheezing, ok for start albuterol  hfa prn

## 2023-08-17 NOTE — Assessment & Plan Note (Signed)
 Age and sex appropriate education and counseling updated with regular exercise and diet Referrals for preventative services - declines dxa  Immunizations addressed - none needed Smoking counseling  - none needed Evidence for depression or other mood disorder - none significant Most recent labs reviewed. I have personally reviewed and have noted: 1) the patient's medical and social history 2) The patient's current medications and supplements 3) The patient's height, weight, and BMI have been recorded in the chart

## 2023-08-17 NOTE — Assessment & Plan Note (Signed)
 Lab Results  Component Value Date   LDLCALC 101 (H) 08/20/2022   Mild uncontrolled, pt to continue zetia  10 mg and lower chol diet, decliens statin

## 2023-08-17 NOTE — Patient Instructions (Signed)
 Please take all new medication as prescribed - the albuterol  hfa as needed  Your A1c was done today  Please continue all other medications as before, and refills have been done if requested.  Please have the pharmacy call with any other refills you may need.  Please continue your efforts at being more active, low cholesterol diet, and weight control.  You are otherwise up to date with prevention measures today.  Please keep your appointments with your specialists as you may have planned  We can hold on other lab work today since you have lab scheduled soon otherwise  Please make an Appointment to return in 6 months, or sooner if needed

## 2023-08-17 NOTE — Assessment & Plan Note (Signed)
 Lab Results  Component Value Date   VITAMINB12 1,136 (H) 08/20/2022   Stable, cont oral replacement - b12 1000 mcg qd

## 2023-08-17 NOTE — Assessment & Plan Note (Signed)
 Lab Results  Component Value Date   HGBA1C 6.3 (A) 08/17/2023   Stable, pt to continue current medical treatment  - diet, wt control

## 2023-08-17 NOTE — Assessment & Plan Note (Signed)
 BP Readings from Last 3 Encounters:  08/17/23 (!) 148/82  04/12/23 (!) 137/57  02/15/23 126/68   Uncontrolled but pt states ok at home, pt to continue medical treatment , declines change today, norvasc  5 every day, clonidine  0.3 bid

## 2023-08-25 ENCOUNTER — Other Ambulatory Visit: Payer: Medicare Other

## 2023-08-26 DIAGNOSIS — C50211 Malignant neoplasm of upper-inner quadrant of right female breast: Secondary | ICD-10-CM | POA: Diagnosis not present

## 2023-08-26 DIAGNOSIS — Z170 Estrogen receptor positive status [ER+]: Secondary | ICD-10-CM | POA: Diagnosis not present

## 2023-09-14 DIAGNOSIS — H9193 Unspecified hearing loss, bilateral: Secondary | ICD-10-CM | POA: Diagnosis not present

## 2023-09-14 DIAGNOSIS — C50919 Malignant neoplasm of unspecified site of unspecified female breast: Secondary | ICD-10-CM | POA: Diagnosis not present

## 2023-10-04 ENCOUNTER — Other Ambulatory Visit: Payer: Self-pay

## 2023-10-04 DIAGNOSIS — C50411 Malignant neoplasm of upper-outer quadrant of right female breast: Secondary | ICD-10-CM

## 2023-10-04 NOTE — Assessment & Plan Note (Signed)
pT1cN0M0, stage IA, G2, ER+/PR+/HER2- -this was discovered on screening mammogram -She underwent right breast lumpectomy on Aug 05, 2022.  I reviewed her surgical pathology findings, which showed a 1.2 cm invasive ductal carcinoma, with positive lymphovascular invasion, grade 2, negative margins. -Due to her advanced age and medical comorbidities, Oncotype was not recommended -Due to her advanced age, immobility from MS, she is not a candidate for adjuvant radiation  ---she started adjuvant exemestane in 08/2022, she is tolerating well, no side effects  -Given her advanced age, and her medical comorbidities, if she has side effects from antiestrogen therapy, I have a low threshold to stop it.  If she decided not to pursue, it is also reasonable.

## 2023-10-05 ENCOUNTER — Inpatient Hospital Stay: Payer: Medicare Other | Attending: Hematology

## 2023-10-05 ENCOUNTER — Inpatient Hospital Stay: Payer: Medicare Other | Admitting: Hematology

## 2023-10-05 VITALS — BP 142/60 | HR 43 | Temp 98.1°F | Resp 16 | Ht 67.0 in

## 2023-10-05 DIAGNOSIS — Z79811 Long term (current) use of aromatase inhibitors: Secondary | ICD-10-CM | POA: Insufficient documentation

## 2023-10-05 DIAGNOSIS — G35 Multiple sclerosis: Secondary | ICD-10-CM | POA: Diagnosis not present

## 2023-10-05 DIAGNOSIS — C50411 Malignant neoplasm of upper-outer quadrant of right female breast: Secondary | ICD-10-CM | POA: Insufficient documentation

## 2023-10-05 DIAGNOSIS — Z170 Estrogen receptor positive status [ER+]: Secondary | ICD-10-CM | POA: Diagnosis not present

## 2023-10-05 LAB — CMP (CANCER CENTER ONLY)
ALT: 9 U/L (ref 0–44)
AST: 15 U/L (ref 15–41)
Albumin: 3.8 g/dL (ref 3.5–5.0)
Alkaline Phosphatase: 93 U/L (ref 38–126)
Anion gap: 5 (ref 5–15)
BUN: 16 mg/dL (ref 8–23)
CO2: 29 mmol/L (ref 22–32)
Calcium: 9.1 mg/dL (ref 8.9–10.3)
Chloride: 107 mmol/L (ref 98–111)
Creatinine: 0.96 mg/dL (ref 0.44–1.00)
GFR, Estimated: 57 mL/min — ABNORMAL LOW (ref >60)
Glucose, Bld: 146 mg/dL — ABNORMAL HIGH (ref 70–99)
Potassium: 3.9 mmol/L (ref 3.5–5.1)
Sodium: 141 mmol/L (ref 135–145)
Total Bilirubin: 0.7 mg/dL (ref 0.0–1.2)
Total Protein: 6.6 g/dL (ref 6.5–8.1)

## 2023-10-05 LAB — CBC WITH DIFFERENTIAL (CANCER CENTER ONLY)
Abs Immature Granulocytes: 0.02 K/uL (ref 0.00–0.07)
Basophils Absolute: 0.1 K/uL (ref 0.0–0.1)
Basophils Relative: 1 %
Eosinophils Absolute: 0.2 K/uL (ref 0.0–0.5)
Eosinophils Relative: 3 %
HCT: 34.2 % — ABNORMAL LOW (ref 36.0–46.0)
Hemoglobin: 11.4 g/dL — ABNORMAL LOW (ref 12.0–15.0)
Immature Granulocytes: 0 %
Lymphocytes Relative: 26 %
Lymphs Abs: 1.6 K/uL (ref 0.7–4.0)
MCH: 28 pg (ref 26.0–34.0)
MCHC: 33.3 g/dL (ref 30.0–36.0)
MCV: 84 fL (ref 80.0–100.0)
Monocytes Absolute: 0.4 K/uL (ref 0.1–1.0)
Monocytes Relative: 7 %
Neutro Abs: 3.7 K/uL (ref 1.7–7.7)
Neutrophils Relative %: 63 %
Platelet Count: 282 K/uL (ref 150–400)
RBC: 4.07 MIL/uL (ref 3.87–5.11)
RDW: 14.3 % (ref 11.5–15.5)
WBC Count: 6 K/uL (ref 4.0–10.5)
nRBC: 0 % (ref 0.0–0.2)

## 2023-10-05 MED ORDER — EXEMESTANE 25 MG PO TABS: 25.0000 mg | ORAL_TABLET | Freq: Every day | ORAL | 1 refills | Status: AC

## 2023-10-05 NOTE — Progress Notes (Signed)
 [facility]   [phone]) [phone] [phone]) [phone]   Clinic Follow up Note   Patient Care Team: Norleen Lynwood ORN, MD as PCP - General [facility] as Consulting Physician (Ophthalmology) Tyree Nanetta SAILOR, RN as Oncology Nurse Navigator Glean, Stephane BROCKS, RN (Inactive) as Oncology Nurse Navigator Lanny Callander, MD as Consulting Physician (Hematology) Shannon Lynwood, MD as Consulting Physician (Radiation Oncology) Curvin Deward MOULD, MD as Consulting Physician (General Surgery) Burton, Lacie K, NP as Nurse Practitioner (Nurse Practitioner)  Date of Service:  10/05/2023  CHIEF COMPLAINT: f/u of breast cancer  CURRENT THERAPY:  Exemestane  25 mg daily  Oncology History   Malignant neoplasm of upper-outer quadrant of right breast in female, estrogen receptor positive (HCC) pT1cN0M0, stage IA, G2, ER+/PR+/HER2- -this was discovered on screening mammogram -She underwent right breast lumpectomy on Aug 05, 2022.  I reviewed her surgical pathology findings, which showed a 1.2 cm invasive ductal carcinoma, with positive lymphovascular invasion, grade 2, negative margins. -Due to her advanced age and medical comorbidities, Oncotype was not recommended -Due to her advanced age, immobility from MS, she is not a candidate for adjuvant radiation  ---she started adjuvant exemestane  in 08/2022, she is tolerating well, no side effects  -Given her advanced age, and her medical comorbidities, if she has side effects from antiestrogen therapy, I have a low threshold to stop it.  If she decided not to pursue, it is also reasonable. Assessment & Plan Breast cancer 89 year old female with breast cancer, status post right breast surgery. Currently on exemestane  since June 2024, tolerating well with occasional constipation. No new symptoms or pain. Normal mammogram in April 2025. - Reviewed exemestane  with a three-month supply. - Continue annual mammograms if she is willing and  able. - Schedule follow-up in a year     SUMMARY OF ONCOLOGIC HISTORY: Oncology History Overview Note   Cancer Staging  Malignant neoplasm of upper-outer quadrant of right breast in female, estrogen receptor positive Staging form: Breast, AJCC 8th Edition - Clinical stage from 07/08/2022: Stage IA (cT1c, cN0, cM0, G2, ER+, PR+, HER2-) - Signed by Lanny Callander, MD on 07/14/2022 Stage prefix: Initial diagnosis Histologic grading system: 3 grade system     Malignant neoplasm of upper-outer quadrant of right breast in female, estrogen receptor positive (HCC)  07/08/2022 Cancer Staging   Staging form: Breast, AJCC 8th Edition - Clinical stage from 07/08/2022: Stage IA (cT1c, cN0, cM0, G2, ER+, PR+, HER2-) - Signed by Lanny Callander, MD on 07/14/2022 Stage prefix: Initial diagnosis Histologic grading system: 3 grade system   07/13/2022 Initial Diagnosis   Malignant neoplasm of upper-outer quadrant of right breast in female, estrogen receptor positive   08/05/2022 Cancer Staging   Staging form: Breast, AJCC 8th Edition - Pathologic stage from 08/05/2022: Stage Unknown (pT1c, pNX, cM0, G2, ER+, PR+, HER2-) - Signed by Lanny Callander, MD on 11/26/2022 Histologic grading system: 3 grade system Residual tumor (R): R0 - None   08/2022 -  Anti-estrogen oral therapy   Began adjuvant anti-estrogen therapy with Exemestane    01/12/2023 Survivorship   SCP delivered by Lacie Burton, NP      Discussed the use of AI scribe software for clinical note transcription with the patient, who gave verbal consent to proceed.  History of Present Illness Deanna Wall is an 89 year old female with breast cancer who presents for follow-up. She is accompanied by a family member who stays with her.  She has not experienced any new symptoms in the  past six months and is eating well. There is no pain or discomfort. She underwent surgery on her right breast.  She has been taking ExaMustin daily since June 2024 for her breast  cancer. She experiences occasional constipation but denies hot flashes or other significant side effects. She is due for a medication refill and prefers a three-month supply.  Her last mammogram was in April 2025, and she has an appointment for her next one. She is willing to continue with her mammograms as long as she is able to go out.     All other systems were reviewed with the patient and are negative.  MEDICAL HISTORY:  Past Medical History:  Diagnosis Date   ANEMIA, PERNICIOUS 01/30/2009   Breast cancer (HCC)    Cataracts, bilateral    DEGENERATIVE DISC DISEASE 10/28/2006   ECZEMA 10/28/2006   Gait disorder    Glaucoma    HYPERLIPIDEMIA 02/16/2010   HYPERTENSION 10/28/2006   Hypothyroidism    Impaired glucose tolerance 12/31/2010   MS (multiple sclerosis) (HCC)    Multiple sclerosis (HCC) 10/28/2006   Obesity    OSTEOARTHRITIS, KNEES, BILATERAL, SEVERE 06/14/2009   Sebaceous cyst    chest    VITAMIN B12 DEFICIENCY 06/14/2009    SURGICAL HISTORY: Past Surgical History:  Procedure Laterality Date   BREAST LUMPECTOMY WITH RADIOACTIVE SEED LOCALIZATION Right 08/05/2022   Procedure: RIGHT BREAST LUMPECTOMY WITH RADIOACTIVE SEED LOCALIZATION;  Surgeon: Curvin Deward MOULD, MD;  Location: [facility] OR;  Service: General;  Laterality: Right;   COLONOSCOPY     ORIF FEMUR FRACTURE Right 05/13/2020   Procedure: OPEN REDUCTION INTERNAL FIXATION (ORIF) DISTAL FEMUR FRACTURE;  Surgeon: Marchia Drivers, MD;  Location: [facility] ORS;  Service: Orthopedics;  Laterality: Right;   TUBAL LIGATION  1958   UPPER GASTROINTESTINAL ENDOSCOPY      I have reviewed the social history and family history with the patient and they are unchanged from previous note.  ALLERGIES:  is allergic to atorvastatin.  MEDICATIONS:  Current Outpatient Medications  Medication Sig Dispense Refill   albuterol  (VENTOLIN  HFA) 108 (90 Base) MCG/ACT inhaler Inhale 2 puffs into the lungs every 6 (six) hours as needed for  wheezing or shortness of breath. 8 g 0   amLODipine  (NORVASC ) 5 MG tablet Take 1 tablet by mouth once daily 90 tablet 3   aspirin  81 MG tablet Take 81 mg by mouth daily.     cholecalciferol (VITAMIN D3) 25 MCG (1000 UNIT) tablet Take 2,000 Units by mouth daily.     citalopram  (CELEXA ) 10 MG tablet Take 1 tablet by mouth once daily 90 tablet 3   cloNIDine  (CATAPRES ) 0.3 MG tablet Take 1 tablet by mouth twice daily 180 tablet 3   diclofenac  sodium (VOLTAREN ) 1 % GEL Apply 2 g topically 4 (four) times daily as needed. 200 g 5   dorzolamide  (TRUSOPT ) 2 % ophthalmic solution 1 drop 2 (two) times daily.     dorzolamide -timolol (COSOPT) 2-0.5 % ophthalmic solution 1 drop 2 (two) times daily.     exemestane  (AROMASIN ) 25 MG tablet Take 1 tablet (25 mg total) by mouth daily after breakfast. 90 tablet 1   ezetimibe  (ZETIA ) 10 MG tablet Take 1 tablet by mouth once daily 90 tablet 3   furosemide  (LASIX ) 20 MG tablet TAKE 1 TABLET BY MOUTH ONCE DAILY AS NEEDED FOR SWELLING 90 tablet 0   latanoprost  (XALATAN ) 0.005 % ophthalmic solution Place 1 drop into both eyes at bedtime.  3   levothyroxine  (SYNTHROID ) 25  MCG tablet Take 1 tablet by mouth once daily 90 tablet 3   LUMIGAN 0.01 % SOLN SMARTSIG:In Eye(s)     nitrofurantoin  (MACRODANTIN ) 50 MG capsule Take 1 capsule by mouth once daily 30 capsule 5   oxyCODONE  (ROXICODONE ) 5 MG immediate release tablet Take 1 tablet (5 mg total) by mouth every 6 (six) hours as needed for severe pain. 10 tablet 0   polyethylene glycol powder (GLYCOLAX /MIRALAX ) 17 GM/SCOOP powder Take 17 g by mouth 2 (two) times daily as needed. 3350 g 1   potassium chloride  (KLOR-CON ) 10 MEQ tablet Take 1 tablet by mouth once daily 90 tablet 3   predniSONE  (DELTASONE ) 50 MG tablet Take one tablet by mouth once daily for 5 days. 5 tablet 0   vitamin B-12 (CYANOCOBALAMIN ) 500 MCG tablet Take 500 mcg by mouth daily.     No current facility-administered medications for this visit.    PHYSICAL  EXAMINATION: ECOG PERFORMANCE STATUS: 2 - Symptomatic, <50% confined to bed  Vitals:   10/05/23 1017  BP: (!) 142/60  Pulse: (!) 43  Resp: 16  Temp: 98.1 F (36.7 C)  SpO2: 96%   Wt Readings from Last 3 Encounters:  08/05/22 250 lb (113.4 kg)  07/29/22 250 lb (113.4 kg)  05/13/20 203 lb 11.3 oz (92.4 kg)     GENERAL:alert, no distress and comfortable SKIN: skin color, texture, turgor are normal, no rashes or significant lesions EYES: normal, Conjunctiva are pink and non-injected, sclera clear NECK: supple, thyroid  normal size, non-tender, without nodularity LYMPH:  no palpable lymphadenopathy in the cervical, axillary  LUNGS: clear to auscultation and percussion with normal breathing effort HEART: regular rate & rhythm and no murmurs and no lower extremity edema ABDOMEN:abdomen soft, non-tender and normal bowel sounds Musculoskeletal:no cyanosis of digits and no clubbing  NEURO: alert & oriented x 3 with fluent speech, no focal motor/sensory deficits BREAST: Breasts symmetrical, no masses, no tenderness, no axillary adenopathy Physical Exam   LABORATORY DATA:  I have reviewed the data as listed    Latest Ref Rng & Units 10/05/2023   10:07 AM 04/12/2023    9:38 AM 02/15/2023   11:21 AM  CBC  WBC 4.0 - 10.5 K/uL 6.0  5.3  5.7   Hemoglobin 12.0 - 15.0 g/dL 88.5  88.2  88.3   Hematocrit 36.0 - 46.0 % 34.2  35.4  35.3   Platelets 150 - 400 K/uL 282  301  304.0         Latest Ref Rng & Units 10/05/2023   10:07 AM 04/12/2023    9:38 AM 02/15/2023   11:21 AM  CMP  Glucose 70 - 99 mg/dL 853  850  860   BUN 8 - 23 mg/dL 16  16  15    Creatinine 0.44 - 1.00 mg/dL 9.03  9.01  9.02   Sodium 135 - 145 mmol/L 141  139  139   Potassium 3.5 - 5.1 mmol/L 3.9  3.8  3.8   Chloride 98 - 111 mmol/L 107  106  104   CO2 22 - 32 mmol/L 29  27  27    Calcium  8.9 - 10.3 mg/dL 9.1  9.2  9.2   Total Protein 6.5 - 8.1 g/dL 6.6  6.7  6.8   Total Bilirubin 0.0 - 1.2 mg/dL 0.7  0.8  0.7    Alkaline Phos 38 - 126 U/L 93  96  91   AST 15 - 41 U/L 15  21  15  ALT 0 - 44 U/L 9  19  10        RADIOGRAPHIC STUDIES: I have personally reviewed the radiological images as listed and agreed with the findings in the report. No results found.    No orders of the defined types were placed in this encounter.  All questions were answered. The patient knows to call the clinic with any problems, questions or concerns. No barriers to learning was detected. The total time spent in the appointment was 25 minutes, including review of chart and various tests results, discussions about plan of care and coordination of care plan     Onita Mattock, MD 10/05/2023

## 2023-10-18 ENCOUNTER — Ambulatory Visit (INDEPENDENT_AMBULATORY_CARE_PROVIDER_SITE_OTHER): Payer: Medicare Other

## 2023-10-18 VITALS — Ht 67.0 in | Wt 250.0 lb

## 2023-10-18 DIAGNOSIS — Z0000 Encounter for general adult medical examination without abnormal findings: Secondary | ICD-10-CM

## 2023-10-18 NOTE — Progress Notes (Signed)
 Subjective:   Deanna Wall is a 89 y.o. who presents for a Medicare Wellness preventive visit.  As a reminder, Annual Wellness Visits don't include a physical exam, and some assessments may be limited, especially if this visit is performed virtually. We may recommend an in-person follow-up visit with your provider if needed.  Visit Complete: Virtual I connected with  Deanna Wall on 10/18/23 by a audio enabled telemedicine application and verified that I am speaking with the correct person using two identifiers.  Patient Location: Home  Provider Location: Office/Clinic  I discussed the limitations of evaluation and management by telemedicine. The patient expressed understanding and agreed to proceed.  Vital Signs: Because this visit was a virtual/telehealth visit, some criteria may be missing or patient reported. Any vitals not documented were not able to be obtained and vitals that have been documented are patient reported.  VideoDeclined- This patient declined Librarian, academic. Therefore the visit was completed with audio only.  Persons Participating in Visit: Patient.  AWV Questionnaire: No: Patient Medicare AWV questionnaire was not completed prior to this visit.  Cardiac Risk Factors include: advanced age (>69men, >8 women);dyslipidemia;hypertension;obesity (BMI >30kg/m2);Other (see comment), Risk factor comments: Multiple Sclerosis     Objective:    Today's Vitals   10/18/23 0855  Weight: 250 lb (113.4 kg)  Height: 5' 7 (1.702 m)   Body mass index is 39.16 kg/m.     10/18/2023    8:56 AM 10/15/2022    8:50 AM 09/05/2022   11:48 AM 07/29/2022   10:24 AM 10/10/2021    2:06 PM 10/02/2020   12:56 PM 05/13/2020    1:52 AM  Advanced Directives  Does Patient Have a Medical Advance Directive? Yes Yes No No No Yes Yes  Type of Estate agent of [place];Living will Healthcare Power of [place];Living will  Living will   Living will Living will  Does patient want to make changes to medical advance directive?    No - Patient declined  No - Patient declined No - Patient declined  Copy of Healthcare Power of Attorney in Chart? No - copy requested No - copy requested       Would patient like information on creating a medical advance directive?   No - Patient declined No - Patient declined No - Patient declined      Current Medications (verified) Outpatient Encounter Medications as of 10/18/2023  Medication Sig   albuterol  (VENTOLIN  HFA) 108 (90 Base) MCG/ACT inhaler Inhale 2 puffs into the lungs every 6 (six) hours as needed for wheezing or shortness of breath.   amLODipine  (NORVASC ) 5 MG tablet Take 1 tablet by mouth once daily   aspirin  81 MG tablet Take 81 mg by mouth daily.   cholecalciferol (VITAMIN D3) 25 MCG (1000 UNIT) tablet Take 2,000 Units by mouth daily.   citalopram  (CELEXA ) 10 MG tablet Take 1 tablet by mouth once daily   cloNIDine  (CATAPRES ) 0.3 MG tablet Take 1 tablet by mouth twice daily   diclofenac  sodium (VOLTAREN ) 1 % GEL Apply 2 g topically 4 (four) times daily as needed.   dorzolamide  (TRUSOPT ) 2 % ophthalmic solution 1 drop 2 (two) times daily.   dorzolamide -timolol (COSOPT) 2-0.5 % ophthalmic solution 1 drop 2 (two) times daily.   exemestane  (AROMASIN ) 25 MG tablet Take 1 tablet (25 mg total) by mouth daily after breakfast.   ezetimibe  (ZETIA ) 10 MG tablet Take 1 tablet by mouth once daily   furosemide  (LASIX )  20 MG tablet TAKE 1 TABLET BY MOUTH ONCE DAILY AS NEEDED FOR SWELLING   latanoprost  (XALATAN ) 0.005 % ophthalmic solution Place 1 drop into both eyes at bedtime.   levothyroxine  (SYNTHROID ) 25 MCG tablet Take 1 tablet by mouth once daily   LUMIGAN 0.01 % SOLN SMARTSIG:In Eye(s)   nitrofurantoin  (MACRODANTIN ) 50 MG capsule Take 1 capsule by mouth once daily   polyethylene glycol powder (GLYCOLAX /MIRALAX ) 17 GM/SCOOP powder Take 17 g by mouth 2 (two) times daily as needed.    potassium chloride  (KLOR-CON ) 10 MEQ tablet Take 1 tablet by mouth once daily   predniSONE  (DELTASONE ) 50 MG tablet Take one tablet by mouth once daily for 5 days.   vitamin B-12 (CYANOCOBALAMIN ) 500 MCG tablet Take 500 mcg by mouth daily.   oxyCODONE  (ROXICODONE ) 5 MG immediate release tablet Take 1 tablet (5 mg total) by mouth every 6 (six) hours as needed for severe pain. (Patient not taking: Reported on 10/18/2023)   No facility-administered encounter medications on file as of 10/18/2023.    Allergies (verified) Atorvastatin   History: Past Medical History:  Diagnosis Date   ANEMIA, PERNICIOUS 01/30/2009   Breast cancer (HCC)    Cataracts, bilateral    DEGENERATIVE DISC DISEASE 10/28/2006   ECZEMA 10/28/2006   Gait disorder    Glaucoma    HYPERLIPIDEMIA 02/16/2010   HYPERTENSION 10/28/2006   Hypothyroidism    Impaired glucose tolerance 12/31/2010   MS (multiple sclerosis) (HCC)    Multiple sclerosis (HCC) 10/28/2006   Obesity    OSTEOARTHRITIS, KNEES, BILATERAL, SEVERE 06/14/2009   Sebaceous cyst    chest    VITAMIN B12 DEFICIENCY 06/14/2009   Past Surgical History:  Procedure Laterality Date   BREAST LUMPECTOMY WITH RADIOACTIVE SEED LOCALIZATION Right 08/05/2022   Procedure: RIGHT BREAST LUMPECTOMY WITH RADIOACTIVE SEED LOCALIZATION;  Surgeon: Curvin Deward MOULD, MD;  Location: [facility];  Service: General;  Laterality: Right;   COLONOSCOPY     ORIF FEMUR FRACTURE Right 05/13/2020   Procedure: OPEN REDUCTION INTERNAL FIXATION (ORIF) DISTAL FEMUR FRACTURE;  Surgeon: Marchia Drivers, MD;  Location: [facility];  Service: Orthopedics;  Laterality: Right;   TUBAL LIGATION  1958   UPPER GASTROINTESTINAL ENDOSCOPY     Family History  Problem Relation Age of Onset   Bone cancer Mother    Arthritis Mother    Diabetes Sister    Arthritis Sister    Stomach cancer Maternal Aunt    Prostate cancer Cousin        maternal first cousin   Social History   Socioeconomic History    Marital status: Widowed    Spouse name: Not on file   Number of children: 5   Years of education: 11TH    Highest education level: Not on file  Occupational History   Occupation: homemaker    Employer: RETIRED  Tobacco Use   Smoking status: Never   Smokeless tobacco: Never  Vaping Use   Vaping status: Never Used  Substance and Sexual Activity   Alcohol  use: No   Drug use: No   Sexual activity: Not Currently  Other Topics Concern   Not on file  Social History Narrative   Multiple Sclerosis (in wheelchair)   Widowed - resides w/Dtr   Social Drivers of Health   Financial Resource Strain: Low Risk  (10/18/2023)   Overall Financial Resource Strain (CARDIA)    Difficulty of Paying Living Expenses: Not hard at all  Food Insecurity: No Food Insecurity (10/18/2023)   Hunger Vital  Sign    Worried About Programme researcher, broadcasting/film/video in the Last Year: Never true    Ran Out of Food in the Last Year: Never true  Transportation Needs: No Transportation Needs (10/18/2023)   PRAPARE - Administrator, Civil Service (Medical): No    Lack of Transportation (Non-Medical): No  Physical Activity: Inactive (10/18/2023)   Exercise Vital Sign    Days of Exercise per Week: 0 days    Minutes of Exercise per Session: 0 min  Stress: No Stress Concern Present (10/18/2023)   [employer] of Occupational Health - Occupational Stress Questionnaire    Feeling of Stress: Not at all  Social Connections: Unknown (10/18/2023)   Social Connection and Isolation Panel    Frequency of Communication with Friends and Family: Patient unable to answer    Frequency of Social Gatherings with Friends and Family: Never    Attends Religious Services: More than 4 times per year    Active Member of [employer] or Organizations: Yes    Attends Banker Meetings: Never    Marital Status: Widowed    Tobacco Counseling Counseling given: No    Clinical Intake:  Pre-visit preparation completed: Yes  Pain :  No/denies pain     BMI - recorded: 39.16 Nutritional Status: BMI > 30  Obese Nutritional Risks: None Diabetes: No  Lab Results  Component Value Date   HGBA1C 6.3 (A) 08/17/2023   HGBA1C 6.8 (H) 02/15/2023   HGBA1C 6.5 08/20/2022     How often do you need to have someone help you when you read instructions, pamphlets, or other written materials from your doctor or pharmacy?: 1 - Never  Interpreter Needed?: No  Comments: Verdie Saba, CMA   Activities of Daily Living     10/18/2023    9:00 AM  In your present state of health, do you have any difficulty performing the following activities:  Hearing? 0  Vision? 0  Difficulty concentrating or making decisions? 0  Walking or climbing stairs? 1  Comment is completely in wheelchair  Dressing or bathing? 1  Comment Dtr helps  Doing errands, shopping? 1  Comment Dtr helps  Preparing Food and eating ? N  Using the Toilet? N  In the past six months, have you accidently leaked urine? Y  Comment wears depends  Do you have problems with loss of bowel control? Y  Managing your Medications? N  Managing your Finances? N  Housekeeping or managing your Housekeeping? Y  Comment Dtr helps    Patient Care Team: Norleen Lynwood ORN, MD as PCP - General [facility] as Consulting Physician (Ophthalmology) Tyree Nanetta SAILOR, RN as Oncology Nurse Navigator Glean, Stephane BROCKS, RN (Inactive) as Oncology Nurse Navigator Lanny Callander, MD as Consulting Physician (Hematology) Shannon Lynwood, MD as Consulting Physician (Radiation Oncology) Curvin Deward MOULD, MD as Consulting Physician (General Surgery) Burton, Lacie K, NP as Nurse Practitioner (Nurse Practitioner)  I have updated your Care Teams any recent Medical Services you may have received from other providers in the past year.     Assessment:   This is a routine wellness examination for [place].  Hearing/Vision screen Hearing Screening - Comments:: Denies hearing difficulties    Vision Screening - Comments:: Wears rx glasses - up to date with routine eye exams with Dr Octavia   Goals Addressed               This Visit's Progress     Patient Stated (pt-stated)  Patient stated she plans to continue to manage her MS and take medicine daily       Depression Screen     10/18/2023    9:03 AM 10/05/2023   10:19 AM 08/17/2023   10:45 AM 02/15/2023   10:40 AM 10/15/2022    8:52 AM 08/20/2022   10:24 AM 07/23/2022    3:37 PM  PHQ 2/9 Scores  PHQ - 2 Score 0 0 0 0 0 0 0  PHQ- 9 Score 3    0      Fall Risk     08/17/2023   10:50 AM 02/15/2023   10:40 AM 10/15/2022    8:52 AM 08/20/2022   10:24 AM 02/18/2022   10:30 AM  Fall Risk   Falls in the past year? 0 0 0 0 0  Number falls in past yr: 0 0 0 0 1  Injury with Fall? 0 0 0 0   Risk for fall due to : No Fall Risks No Fall Risks No Fall Risks No Fall Risks No Fall Risks  Follow up Falls evaluation completed Falls evaluation completed Falls prevention discussed Falls evaluation completed Falls evaluation completed      Data saved with a previous flowsheet row definition    MEDICARE RISK AT HOME:  Medicare Risk at Home Any stairs in or around the home?: No If so, are there any without handrails?: No Home free of loose throw rugs in walkways, pet beds, electrical cords, etc?: Yes Adequate lighting in your home to reduce risk of falls?: Yes Life alert?: No Use of a cane, walker or w/c?: Yes (wheelchair) Grab bars in the bathroom?: No Shower chair or bench in shower?: No Elevated toilet seat or a handicapped toilet?: Yes  TIMED UP AND GO:  Was the test performed?  No  Cognitive Function: 6CIT completed    11/04/2016   11:53 AM  MMSE - Mini Mental State Exam  Orientation to time 5   Orientation to Place 5   Registration 3   Attention/ Calculation 5   Recall 2   Language- name 2 objects 2   Language- repeat 1  Language- follow 3 step command 3   Language- read & follow direction 1   Write  a sentence 1   Copy design 1   Total score 29      Data saved with a previous flowsheet row definition        10/18/2023    9:06 AM 10/15/2022    8:52 AM  6CIT Screen  What Year? 0 points 0 points  What month? 0 points 0 points  What time? 0 points 0 points  Count back from 20 0 points 0 points  Months in reverse 0 points 0 points  Repeat phrase 0 points 0 points  Total Score 0 points 0 points    Immunizations Immunization History  Administered Date(s) Administered   Fluad Quad(high Dose 65+) 02/17/2021, 01/10/2023   Influenza Split 12/31/2010, 12/07/2011   Influenza Whole 03/23/2005, 12/09/2007, 12/17/2008, 02/04/2010   Influenza, High Dose Seasonal PF 12/23/2016, 12/26/2018   Influenza,inj,Quad PF,6+ Mos 12/15/2012, 11/09/2013, 12/25/2014, 11/13/2015, 01/14/2018   Influenza-Unspecified 12/26/2019, 12/16/2021   Moderna Covid-19 Fall Seasonal Vaccine 54yrs & older 11/23/2022   PFIZER(Purple Top)SARS-COV-2 Vaccination 04/12/2019, 05/03/2019, 02/09/2020, 12/16/2021   Pneumococcal Conjugate-13 05/12/2013   Pneumococcal Polysaccharide-23 03/23/2005, 12/31/2010   Td 03/24/1999, 02/04/2010   Tdap 08/21/2022   Zoster Recombinant(Shingrix) 08/21/2022, 11/23/2022   Zoster, Live 12/09/2007    Screening Tests Health  Maintenance  Topic Date Due   DEXA SCAN  Never done   INFLUENZA VACCINE  10/22/2023   Medicare Annual Wellness (AWV)  10/17/2024   DTaP/Tdap/Td (4 - Td or Tdap) 08/20/2032   Pneumococcal Vaccine: 50+ Years  Completed   Zoster Vaccines- Shingrix  Completed   Hepatitis B Vaccines  Aged Out   HPV VACCINES  Aged Out   Meningococcal B Vaccine  Aged Out   COVID-19 Vaccine  Discontinued    Health Maintenance  Health Maintenance Due  Topic Date Due   DEXA SCAN  Never done   Health Maintenance Items Addressed: 10/18/2023  I have recommended that this patient have a DEXA scan but she declines at this time. I have discussed the risks and benefits of this procedure  with her. The patient verbalizes understanding.   Additional Screening:  Vision Screening: Recommended annual ophthalmology exams for early detection of glaucoma and other disorders of the eye. Would you like a referral to an eye doctor? No    Dental Screening: Recommended annual dental exams for proper oral hygiene  Community Resource Referral / Chronic Care Management: CRR required this visit?  No   CCM required this visit?  No   Plan:    I have personally reviewed and noted the following in the patient's chart:   Medical and social history Use of alcohol , tobacco or illicit drugs  Current medications and supplements including opioid prescriptions. Patient is not currently taking opioid prescriptions. Functional ability and status Nutritional status Physical activity Advanced directives List of other physicians Hospitalizations, surgeries, and ER visits in previous 12 months Vitals Screenings to include cognitive, depression, and falls Referrals and appointments  In addition, I have reviewed and discussed with patient certain preventive protocols, quality metrics, and best practice recommendations. A written personalized care plan for preventive services as well as general preventive health recommendations were provided to patient.   Verdie CHRISTELLA Saba, CMA   10/18/2023   After Visit Summary: (Mail) Due to this being a telephonic visit, the after visit summary with patients personalized plan was offered to patient via mail   Notes: Please refer to Routing Comments.

## 2023-10-18 NOTE — Patient Instructions (Addendum)
 Ms. Deanna Wall , Thank you for taking time out of your busy schedule to complete your Annual Wellness Visit with me. I enjoyed our conversation and look forward to speaking with you again next year. I, as well as your care team,  appreciate your ongoing commitment to your health goals. Please review the following plan we discussed and let me know if I can assist you in the future. Your Game plan/ To Do List   Follow up Visits: Next Medicare AWV with our clinical staff: 10/18/2024   Have you seen your provider in the last 6 months (3 months if uncontrolled diabetes)? No Next Office Visit with your provider: 02/21/2024  Clinician Recommendations:  Aim for 30 minutes of exercise or brisk walking, 6-8 glasses of water, and 5 servings of fruits and vegetables each day. Educated and advised on getting a (Bone) DEXA Scan.      This is a list of the screening recommended for you and due dates:  Health Maintenance  Topic Date Due   DEXA scan (bone density measurement)  Never done   Flu Shot  10/22/2023   Medicare Annual Wellness Visit  10/17/2024   DTaP/Tdap/Td vaccine (4 - Td or Tdap) 08/20/2032   Pneumococcal Vaccine for age over 32  Completed   Zoster (Shingles) Vaccine  Completed   Hepatitis B Vaccine  Aged Out   HPV Vaccine  Aged Out   Meningitis B Vaccine  Aged Out   COVID-19 Vaccine  Discontinued    Advanced directives: (Copy Requested) Please bring a copy of your health care power of attorney and living will to the office to be added to your chart at your convenience. You can mail to [facility] [address] [place], [place] [address] or email to [email or web address] Advance Care Planning is important because it:  [x]  Makes sure you receive the medical care that is consistent with your values, goals, and preferences  [x]  It provides guidance to your family and loved ones and reduces their decisional burden about whether or not they are making the right decisions  based on your wishes.  Follow the link provided in your after visit summary or read over the paperwork we have mailed to you to help you started getting your Advance Directives in place. If you need assistance in completing these, please reach out to us  so that we can help you!

## 2023-11-01 ENCOUNTER — Other Ambulatory Visit: Payer: Self-pay | Admitting: Internal Medicine

## 2023-12-16 DIAGNOSIS — H02834 Dermatochalasis of left upper eyelid: Secondary | ICD-10-CM | POA: Diagnosis not present

## 2023-12-16 DIAGNOSIS — H04123 Dry eye syndrome of bilateral lacrimal glands: Secondary | ICD-10-CM | POA: Diagnosis not present

## 2023-12-16 DIAGNOSIS — H401232 Low-tension glaucoma, bilateral, moderate stage: Secondary | ICD-10-CM | POA: Diagnosis not present

## 2023-12-16 DIAGNOSIS — H02831 Dermatochalasis of right upper eyelid: Secondary | ICD-10-CM | POA: Diagnosis not present

## 2024-02-03 ENCOUNTER — Other Ambulatory Visit: Payer: Self-pay | Admitting: Internal Medicine

## 2024-02-21 ENCOUNTER — Encounter: Payer: Self-pay | Admitting: Internal Medicine

## 2024-02-21 ENCOUNTER — Ambulatory Visit: Payer: Self-pay | Admitting: Internal Medicine

## 2024-02-21 ENCOUNTER — Ambulatory Visit: Admitting: Internal Medicine

## 2024-02-21 VITALS — BP 128/72 | HR 50 | Temp 98.4°F | Ht 67.0 in

## 2024-02-21 DIAGNOSIS — Z0001 Encounter for general adult medical examination with abnormal findings: Secondary | ICD-10-CM

## 2024-02-21 DIAGNOSIS — E538 Deficiency of other specified B group vitamins: Secondary | ICD-10-CM

## 2024-02-21 DIAGNOSIS — Z0000 Encounter for general adult medical examination without abnormal findings: Secondary | ICD-10-CM

## 2024-02-21 DIAGNOSIS — I10 Essential (primary) hypertension: Secondary | ICD-10-CM | POA: Diagnosis not present

## 2024-02-21 DIAGNOSIS — D509 Iron deficiency anemia, unspecified: Secondary | ICD-10-CM | POA: Diagnosis not present

## 2024-02-21 DIAGNOSIS — E039 Hypothyroidism, unspecified: Secondary | ICD-10-CM | POA: Diagnosis not present

## 2024-02-21 DIAGNOSIS — R7302 Impaired glucose tolerance (oral): Secondary | ICD-10-CM | POA: Diagnosis not present

## 2024-02-21 DIAGNOSIS — E782 Mixed hyperlipidemia: Secondary | ICD-10-CM | POA: Diagnosis not present

## 2024-02-21 DIAGNOSIS — H9192 Unspecified hearing loss, left ear: Secondary | ICD-10-CM | POA: Diagnosis not present

## 2024-02-21 DIAGNOSIS — E559 Vitamin D deficiency, unspecified: Secondary | ICD-10-CM

## 2024-02-21 LAB — CBC WITH DIFFERENTIAL/PLATELET
Basophils Absolute: 0 K/uL (ref 0.0–0.1)
Basophils Relative: 0.7 % (ref 0.0–3.0)
Eosinophils Absolute: 0.1 K/uL (ref 0.0–0.7)
Eosinophils Relative: 1.5 % (ref 0.0–5.0)
HCT: 37.2 % (ref 36.0–46.0)
Hemoglobin: 12.2 g/dL (ref 12.0–15.0)
Lymphocytes Relative: 30.7 % (ref 12.0–46.0)
Lymphs Abs: 1.9 K/uL (ref 0.7–4.0)
MCHC: 32.7 g/dL (ref 30.0–36.0)
MCV: 86.6 fl (ref 78.0–100.0)
Monocytes Absolute: 0.5 K/uL (ref 0.1–1.0)
Monocytes Relative: 7.7 % (ref 3.0–12.0)
Neutro Abs: 3.6 K/uL (ref 1.4–7.7)
Neutrophils Relative %: 59.4 % (ref 43.0–77.0)
Platelets: 299 K/uL (ref 150.0–400.0)
RBC: 4.3 Mil/uL (ref 3.87–5.11)
RDW: 15.1 % (ref 11.5–15.5)
WBC: 6.1 K/uL (ref 4.0–10.5)

## 2024-02-21 LAB — LIPID PANEL
Cholesterol: 155 mg/dL (ref 0–200)
HDL: 40.4 mg/dL (ref >39.00)
LDL Cholesterol: 99 mg/dL (ref 0–99)
NonHDL: 114.75
Total CHOL/HDL Ratio: 4
Triglycerides: 78 mg/dL (ref 0.0–149.0)
VLDL: 15.6 mg/dL (ref 0.0–40.0)

## 2024-02-21 LAB — HEPATIC FUNCTION PANEL
ALT: 13 U/L (ref 0–35)
AST: 14 U/L (ref 0–37)
Albumin: 3.8 g/dL (ref 3.5–5.2)
Alkaline Phosphatase: 100 U/L (ref 39–117)
Bilirubin, Direct: 0.2 mg/dL (ref 0.0–0.3)
Total Bilirubin: 1 mg/dL (ref 0.2–1.2)
Total Protein: 6.7 g/dL (ref 6.0–8.3)

## 2024-02-21 LAB — BASIC METABOLIC PANEL WITH GFR
BUN: 13 mg/dL (ref 6–23)
CO2: 31 meq/L (ref 19–32)
Calcium: 9.1 mg/dL (ref 8.4–10.5)
Chloride: 106 meq/L (ref 96–112)
Creatinine, Ser: 0.94 mg/dL (ref 0.40–1.20)
GFR: 53.52 mL/min — ABNORMAL LOW (ref >60.00)
Glucose, Bld: 133 mg/dL — ABNORMAL HIGH (ref 70–99)
Potassium: 4 meq/L (ref 3.5–5.1)
Sodium: 142 meq/L (ref 135–145)

## 2024-02-21 LAB — IBC PANEL
Iron: 93 ug/dL (ref 42–145)
Saturation Ratios: 32.1 % (ref 20.0–50.0)
TIBC: 289.8 ug/dL (ref 250.0–450.0)
Transferrin: 207 mg/dL — ABNORMAL LOW (ref 212.0–360.0)

## 2024-02-21 LAB — FERRITIN: Ferritin: 61.8 ng/mL (ref 10.0–291.0)

## 2024-02-21 LAB — VITAMIN B12: Vitamin B-12: >1500 pg/mL — ABNORMAL HIGH (ref 211–911)

## 2024-02-21 LAB — HEMOGLOBIN A1C: Hgb A1c MFr Bld: 6.3 % (ref 4.6–6.5)

## 2024-02-21 LAB — VITAMIN D 25 HYDROXY (VIT D DEFICIENCY, FRACTURES): VITD: 57.81 ng/mL (ref 30.00–100.00)

## 2024-02-21 LAB — TSH: TSH: 2.22 u[IU]/mL (ref 0.35–5.50)

## 2024-02-21 NOTE — Assessment & Plan Note (Signed)
 Lab Results  Component Value Date   LDLCALC 101 (H) 08/20/2022   Mild uncontrolled, pt to continue zetia  10 mg every day and lower chol diet, declines statin for now

## 2024-02-21 NOTE — Assessment & Plan Note (Signed)
 Lab Results  Component Value Date   VITAMINB12 1,136 (H) 08/20/2022   Stable, cont oral replacement - b12 1000 mcg qd

## 2024-02-21 NOTE — Patient Instructions (Signed)
 Please continue all other medications as before, and refills have been done if requested.  Please have the pharmacy call with any other refills you may need.  Please continue your efforts at being more active, low cholesterol diet, and weight control.  You are otherwise up to date with prevention measures today.  Please keep your appointments with your specialists as you may have planned  You will be contacted regarding the referral for: ENT for hearing loss  Please go to the LAB at the blood drawing area for the tests to be done  You will be contacted by phone if any changes need to be made immediately.  Otherwise, you will receive a letter about your results with an explanation, but please check with MyChart first.  Please make an Appointment to return in 6 months, or sooner if needed

## 2024-02-21 NOTE — Assessment & Plan Note (Signed)
 BP Readings from Last 3 Encounters:  02/21/24 128/72  10/05/23 (!) 142/60  08/17/23 (!) 148/82   Stable, pt to continue medical treatment norvasc  5 every day, catapress 0.3 bid,

## 2024-02-21 NOTE — Progress Notes (Signed)
 Patient ID: Deanna Wall, female   DOB: Apr 28, 1931, 89 y.o.   MRN: [id number]         Chief Complaint:: wellness exam and hearing loss, anemia iron deficiency hyperglycemia, ckd3a, low vit d, low thyroid , htn, hld       HPI:  Deanna Wall is a 89 y.o. female here for wellness exam with family, overall doing nicely, pt declines DXA o/w up to date                        Also Pt denies chest pain, increased sob or doe, wheezing, orthopnea, PND, increased LE swelling, palpitations, dizziness or syncope  Pt denies polydipsia, polyuria, or new focal neuro s/s.    Pt denies fever, wt loss, night sweats, loss of appetite, or other constitutional symptoms  Does have several months of worsening left hearing, requests ENT referral.  Pt able for toileting herself at home, though o/w in wheelchair.     Wt Readings from Last 3 Encounters:  10/18/23 250 lb (113.4 kg)  08/05/22 250 lb (113.4 kg)  07/29/22 250 lb (113.4 kg)   BP Readings from Last 3 Encounters:  02/21/24 128/72  10/05/23 (!) 142/60  08/17/23 (!) 148/82   Immunization History  Administered Date(s) Administered   Fluad Quad(high Dose 65+) 02/17/2021, 01/10/2023   INFLUENZA, HIGH DOSE SEASONAL PF 12/23/2016, 12/26/2018   Influenza Split 12/31/2010, 12/07/2011   Influenza Whole 03/23/2005, 12/09/2007, 12/17/2008, 02/04/2010   Influenza,inj,Quad PF,6+ Mos 12/15/2012, 11/09/2013, 12/25/2014, 11/13/2015, 01/14/2018   Influenza-Unspecified 12/26/2019, 12/16/2021, 01/10/2024   Moderna Covid-19 Fall Seasonal Vaccine 25yrs & older 11/23/2022   PFIZER(Purple Top)SARS-COV-2 Vaccination 04/12/2019, 05/03/2019, 02/09/2020, 12/16/2021   Pneumococcal Conjugate-13 05/12/2013   Pneumococcal Polysaccharide-23 03/23/2005, 12/31/2010   Td 03/24/1999, 02/04/2010   Tdap 08/21/2022   Zoster Recombinant(Shingrix) 08/21/2022, 11/23/2022   Zoster, Live 12/09/2007   There are no preventive care reminders to display for this patient.     Past Medical  History:  Diagnosis Date   ANEMIA, PERNICIOUS 01/30/2009   Breast cancer (HCC)    Cataracts, bilateral    DEGENERATIVE DISC DISEASE 10/28/2006   ECZEMA 10/28/2006   Gait disorder    Glaucoma    HYPERLIPIDEMIA 02/16/2010   HYPERTENSION 10/28/2006   Hypothyroidism    Impaired glucose tolerance 12/31/2010   MS (multiple sclerosis)    Multiple sclerosis 10/28/2006   Obesity    OSTEOARTHRITIS, KNEES, BILATERAL, SEVERE 06/14/2009   Sebaceous cyst    chest    VITAMIN B12 DEFICIENCY 06/14/2009   Past Surgical History:  Procedure Laterality Date   BREAST LUMPECTOMY WITH RADIOACTIVE SEED LOCALIZATION Right 08/05/2022   Procedure: RIGHT BREAST LUMPECTOMY WITH RADIOACTIVE SEED LOCALIZATION;  Surgeon: Curvin Deward MOULD, MD;  Location: [facility] OR;  Service: General;  Laterality: Right;   COLONOSCOPY     ORIF FEMUR FRACTURE Right 05/13/2020   Procedure: OPEN REDUCTION INTERNAL FIXATION (ORIF) DISTAL FEMUR FRACTURE;  Surgeon: Marchia Drivers, MD;  Location: [facility] ORS;  Service: Orthopedics;  Laterality: Right;   TUBAL LIGATION  1958   UPPER GASTROINTESTINAL ENDOSCOPY      reports that she has never smoked. She has never used smokeless tobacco. She reports that she does not drink alcohol  and does not use drugs. family history includes Arthritis in her mother and sister; Bone cancer in her mother; Diabetes in her sister; Prostate cancer in her cousin; Stomach cancer in her maternal aunt. Allergies  Allergen Reactions   Atorvastatin     REACTION:  feels funny in face and head   Current Outpatient Medications on File Prior to Visit  Medication Sig Dispense Refill   albuterol  (VENTOLIN  HFA) 108 (90 Base) MCG/ACT inhaler Inhale 2 puffs into the lungs every 6 (six) hours as needed for wheezing or shortness of breath. 8 g 0   amLODipine  (NORVASC ) 5 MG tablet Take 1 tablet by mouth once daily 90 tablet 0   aspirin  81 MG tablet Take 81 mg by mouth daily.     cholecalciferol (VITAMIN D3) 25 MCG (1000 UNIT)  tablet Take 2,000 Units by mouth daily.     citalopram  (CELEXA ) 10 MG tablet Take 1 tablet by mouth once daily 90 tablet 0   cloNIDine  (CATAPRES ) 0.3 MG tablet Take 1 tablet by mouth twice daily 180 tablet 3   diclofenac  sodium (VOLTAREN ) 1 % GEL Apply 2 g topically 4 (four) times daily as needed. 200 g 5   dorzolamide  (TRUSOPT ) 2 % ophthalmic solution 1 drop 2 (two) times daily.     dorzolamide -timolol (COSOPT) 2-0.5 % ophthalmic solution 1 drop 2 (two) times daily.     exemestane  (AROMASIN ) 25 MG tablet Take 1 tablet (25 mg total) by mouth daily after breakfast. 90 tablet 1   ezetimibe  (ZETIA ) 10 MG tablet Take 1 tablet by mouth once daily 90 tablet 0   furosemide  (LASIX ) 20 MG tablet TAKE 1 TABLET BY MOUTH ONCE DAILY AS NEEDED FOR SWELLING 90 tablet 0   latanoprost  (XALATAN ) 0.005 % ophthalmic solution Place 1 drop into both eyes at bedtime.  3   levothyroxine  (SYNTHROID ) 25 MCG tablet Take 1 tablet by mouth once daily 90 tablet 0   LUMIGAN 0.01 % SOLN SMARTSIG:In Eye(s)     nitrofurantoin  (MACRODANTIN ) 50 MG capsule Take 1 capsule by mouth once daily 30 capsule 0   oxyCODONE  (ROXICODONE ) 5 MG immediate release tablet Take 1 tablet (5 mg total) by mouth every 6 (six) hours as needed for severe pain. 10 tablet 0   polyethylene glycol powder (GLYCOLAX /MIRALAX ) 17 GM/SCOOP powder Take 17 g by mouth 2 (two) times daily as needed. 3350 g 1   potassium chloride  (KLOR-CON ) 10 MEQ tablet Take 1 tablet by mouth once daily 90 tablet 3   vitamin B-12 (CYANOCOBALAMIN ) 500 MCG tablet Take 500 mcg by mouth daily.     No current facility-administered medications on file prior to visit.        ROS:  All others reviewed and negative.  Objective        PE:  BP 128/72 (BP Location: Right Arm, Patient Position: Sitting, Cuff Size: Normal)   Pulse (!) 50   Temp 98.4 F (36.9 C) (Oral)   Ht 5' 7 (1.702 m)   SpO2 99%   BMI 39.16 kg/m                 Constitutional: Pt appears in NAD in wheelchair                HENT: Head: NCAT.                Right Ear: External ear normal.                 Left Ear: External ear normal. , left canal without obstruction               Eyes: . Pupils are equal, round, and reactive to light. Conjunctivae and EOM are normal  Nose: without d/c or deformity               Neck: Neck supple. Gross normal ROM               Cardiovascular: Normal rate and regular rhythm.                 Pulmonary/Chest: Effort normal and breath sounds without rales or wheezing.                Abd:  Soft, NT, ND, + BS, no organomegaly               Neurological: Pt is alert. At baseline orientation, motor grossly intact               Skin: Skin is warm. No rashes, no other new lesions, LE edema - trace pedal bilateral               Psychiatric: Pt behavior is normal without agitation   Micro: none  Cardiac tracings I have personally interpreted today:  none  Pertinent Radiological findings (summarize): none   Lab Results  Component Value Date   WBC 6.0 10/05/2023   HGB 11.4 (L) 10/05/2023   HCT 34.2 (L) 10/05/2023   PLT 282 10/05/2023   GLUCOSE 146 (H) 10/05/2023   CHOL 170 08/20/2022   TRIG 99.0 08/20/2022   HDL 48.50 08/20/2022   LDLDIRECT 180.0 01/29/2010   LDLCALC 101 (H) 08/20/2022   ALT 9 10/05/2023   AST 15 10/05/2023   NA 141 10/05/2023   K 3.9 10/05/2023   CL 107 10/05/2023   CREATININE 0.96 10/05/2023   BUN 16 10/05/2023   CO2 29 10/05/2023   TSH 3.19 08/20/2022   INR 1.2 05/13/2020   HGBA1C 6.3 (A) 08/17/2023   Assessment/Plan:  Deanna Wall is a 89 y.o. Black or African American [2] female with  has a past medical history of ANEMIA, PERNICIOUS (01/30/2009), Breast cancer (HCC), Cataracts, bilateral, DEGENERATIVE DISC DISEASE (10/28/2006), ECZEMA (10/28/2006), Gait disorder, Glaucoma, HYPERLIPIDEMIA (02/16/2010), HYPERTENSION (10/28/2006), Hypothyroidism, Impaired glucose tolerance (12/31/2010), MS (multiple sclerosis), Multiple sclerosis  (10/28/2006), Obesity, OSTEOARTHRITIS, KNEES, BILATERAL, SEVERE (06/14/2009), Sebaceous cyst, and VITAMIN B12 DEFICIENCY (06/14/2009).  Encounter for well adult exam with abnormal findings Age and sex appropriate education and counseling updated with regular exercise and diet Referrals for preventative services  - declines DXA Immunizations addressed - none needed Smoking counseling  - none needed Evidence for depression or other mood disorder - none significant Most recent labs reviewed. I have personally reviewed and have noted: 1) the patient's medical and social history 2) The patient's current medications and supplements 3) The patient's height, weight, and BMI have been recorded in the chart   Vitamin D  deficiency Last vitamin D  Lab Results  Component Value Date   VD25OH 83.98 02/15/2023   Stable, cont oral replacement   Iron deficiency anemia No recent overt bleeding, now for f/u iron labs, cbc  Impaired glucose tolerance Lab Results  Component Value Date   HGBA1C 6.3 (A) 08/17/2023   Stable, pt to continue current medical treatment  - diet, wt control   Hypothyroidism Lab Results  Component Value Date   TSH 3.19 08/20/2022   Stable, pt to continue levothyroxine  25 mcg qd   Hyperlipidemia Lab Results  Component Value Date   LDLCALC 101 (H) 08/20/2022   Mild uncontrolled, pt to continue zetia  10 mg every day and lower chol diet, declines statin for now   Essential  hypertension BP Readings from Last 3 Encounters:  02/21/24 128/72  10/05/23 (!) 142/60  08/17/23 (!) 148/82   Stable, pt to continue medical treatment norvasc  5 every day, catapress 0.3 bid,    B12 deficiency Lab Results  Component Value Date   VITAMINB12 1,136 (H) 08/20/2022   Stable, cont oral replacement - b12 1000 mcg qd  Followup: Return in about 6 months (around 08/21/2024).  Lynwood Rush, MD 02/21/2024 1:04 PM [facility]

## 2024-02-21 NOTE — Assessment & Plan Note (Signed)
 Pt now for referral to ENT for further consideration

## 2024-02-21 NOTE — Assessment & Plan Note (Signed)
 No recent overt bleeding, now for f/u iron labs, cbc

## 2024-02-21 NOTE — Assessment & Plan Note (Signed)
 Lab Results  Component Value Date   TSH 3.19 08/20/2022   Stable, pt to continue levothyroxine 25 mcg qd

## 2024-02-21 NOTE — Assessment & Plan Note (Signed)
 Last vitamin D  Lab Results  Component Value Date   VD25OH 83.98 02/15/2023   Stable, cont oral replacement

## 2024-02-21 NOTE — Assessment & Plan Note (Signed)
 Lab Results  Component Value Date   HGBA1C 6.3 (A) 08/17/2023   Stable, pt to continue current medical treatment  - diet, wt control

## 2024-02-21 NOTE — Assessment & Plan Note (Signed)
 Age and sex appropriate education and counseling updated with regular exercise and diet Referrals for preventative services  - declines DXA Immunizations addressed - none needed Smoking counseling  - none needed Evidence for depression or other mood disorder - none significant Most recent labs reviewed. I have personally reviewed and have noted: 1) the patient's medical and social history 2) The patient's current medications and supplements 3) The patient's height, weight, and BMI have been recorded in the chart

## 2024-02-29 ENCOUNTER — Emergency Department (HOSPITAL_COMMUNITY): Admission: EM | Admit: 2024-02-29 | Discharge: 2024-02-29 | Disposition: A | Discharge status: Home / Self Care

## 2024-02-29 ENCOUNTER — Other Ambulatory Visit: Payer: Self-pay

## 2024-02-29 ENCOUNTER — Encounter (HOSPITAL_COMMUNITY): Payer: Self-pay | Admitting: *Deleted

## 2024-02-29 DIAGNOSIS — M25511 Pain in right shoulder: Secondary | ICD-10-CM | POA: Diagnosis not present

## 2024-02-29 DIAGNOSIS — S40021A Contusion of right upper arm, initial encounter: Secondary | ICD-10-CM | POA: Diagnosis not present

## 2024-02-29 DIAGNOSIS — Y92410 Unspecified street and highway as the place of occurrence of the external cause: Secondary | ICD-10-CM | POA: Diagnosis not present

## 2024-02-29 DIAGNOSIS — Z7982 Long term (current) use of aspirin: Secondary | ICD-10-CM | POA: Diagnosis not present

## 2024-02-29 MED ORDER — ACETAMINOPHEN 500 MG PO TABS
1000.0000 mg | ORAL_TABLET | Freq: Once | ORAL | Status: AC
  Administered 2024-02-29: 1000 mg via ORAL
  Filled 2024-02-29: qty 2
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
## 2024-03-06 ENCOUNTER — Other Ambulatory Visit: Payer: Self-pay | Admitting: Internal Medicine

## 2024-03-06 ENCOUNTER — Other Ambulatory Visit: Payer: Self-pay

## 2024-03-07 ENCOUNTER — Encounter: Payer: Self-pay | Admitting: Internal Medicine

## 2024-03-07 ENCOUNTER — Ambulatory Visit: Admitting: Internal Medicine

## 2024-03-07 VITALS — BP 134/82 | HR 53 | Temp 98.3°F | Ht 67.0 in

## 2024-03-07 DIAGNOSIS — E559 Vitamin D deficiency, unspecified: Secondary | ICD-10-CM | POA: Diagnosis not present

## 2024-03-07 DIAGNOSIS — R7302 Impaired glucose tolerance (oral): Secondary | ICD-10-CM

## 2024-03-07 DIAGNOSIS — T07XXXA Unspecified multiple injuries, initial encounter: Secondary | ICD-10-CM | POA: Insufficient documentation

## 2024-03-07 DIAGNOSIS — I10 Essential (primary) hypertension: Secondary | ICD-10-CM

## 2024-03-07 NOTE — Assessment & Plan Note (Addendum)
 With mild tender persists but already starting to fade to right medial arm and left breast,; no other bleeding, ok to continue to follow till healed, d/w pt natural history of healing till resolved
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
## 2024-03-07 NOTE — Assessment & Plan Note (Signed)
 Lab Results  Component Value Date   HGBA1C 6.3 02/21/2024   Stable, pt to continue current medical treatment  - diet,wt control

## 2024-03-07 NOTE — Patient Instructions (Signed)
Please continue all other medications as before, and refills have been done if requested.  Please have the pharmacy call with any other refills you may need.  Please continue your efforts at being more active, low cholesterol diet, and weight control.  Please keep your appointments with your specialists as you may have planned     

## 2024-03-07 NOTE — Assessment & Plan Note (Signed)
 BP Readings from Last 3 Encounters:  03/07/24 134/82  02/29/24 (!) 157/55  02/21/24 128/72   Stable, pt to continue medical treatment norvasc  5 every day, catapress  0.3 bid,

## 2024-03-07 NOTE — Assessment & Plan Note (Signed)
 Last vitamin D  Lab Results  Component Value Date   VD25OH 57.81 02/21/2024   Stable, cont oral replacement

## 2024-03-08 ENCOUNTER — Other Ambulatory Visit: Payer: Self-pay | Admitting: Internal Medicine

## 2024-03-08 ENCOUNTER — Other Ambulatory Visit: Payer: Self-pay

## 2024-03-13 ENCOUNTER — Telehealth: Payer: Self-pay

## 2024-03-13 NOTE — Telephone Encounter (Signed)
 Copied from CRM #[id number]. Topic: Clinical - Prescription Issue >> Mar 13, 2024 10:12 AM Robinson H wrote: Reason for CRM: Patient states she went to pick up her cloNIDine  (CATAPRES ) 0.3 MG tablet medication on Saturday from [place] [facility] and was told no medication was called in and to contact the provider, system shows 180 pills were sent to the pharmacy on 12/15, patient is almost out.  Janeene [phone]

## 2024-03-14 ENCOUNTER — Other Ambulatory Visit: Payer: Self-pay

## 2024-03-14 MED ORDER — CLONIDINE HCL 0.3 MG PO TABS: 0.3000 mg | ORAL_TABLET | Freq: Two times a day (BID) | ORAL | 0 refills | Status: AC

## 2024-03-14 NOTE — Telephone Encounter (Signed)
 Prescription has been resent to the [place] [facility] on [address].

## 2024-03-22 ENCOUNTER — Ambulatory Visit (INDEPENDENT_AMBULATORY_CARE_PROVIDER_SITE_OTHER): Admitting: Physician Assistant

## 2024-03-22 ENCOUNTER — Encounter (INDEPENDENT_AMBULATORY_CARE_PROVIDER_SITE_OTHER): Payer: Self-pay | Admitting: Physician Assistant

## 2024-03-22 VITALS — BP 134/72 | HR 48

## 2024-03-22 DIAGNOSIS — H6121 Impacted cerumen, right ear: Secondary | ICD-10-CM

## 2024-03-22 DIAGNOSIS — H6122 Impacted cerumen, left ear: Secondary | ICD-10-CM | POA: Diagnosis not present

## 2024-03-22 NOTE — Progress Notes (Addendum)
 Dear Dr. Norleen, Here is my assessment for our mutual patient, Deanna Wall. Thank you for allowing me the opportunity to care for your patient. Please do not hesitate to contact me should you have any other questions. Sincerely, Deanna Wall  Otolaryngology Clinic Note Referring provider: Dr. Norleen HPI:  Deanna Wall is a 89 y.o. female kindly referred by Dr. Norleen   Discussed the use of AI scribe software for clinical note transcription with the patient, who gave verbal consent to proceed.  History of Present Illness   Deanna Wall is a 89 year old female who presents with left-sided hearing loss.  She has experienced decreased hearing in the left ear for approximately one month. The right ear has normal hearing without difficulty.  She denies otalgia, history of trauma, or prior otologic surgery. She describes a sensation of drainage from the left ear and expresses apprehension about self-cleaning, having attempted to clean the ear with uncertain effectiveness.  She denies vertigo, tinnitus, or otorrhea. No symptoms are reported in the right ear.           Independent Review of Additional Tests or Records:  PCP office note, 03/07/2024   PMH/Meds/All/SocHx/FamHx/ROS:   Past Medical History:  Diagnosis Date   ANEMIA, PERNICIOUS 01/30/2009   Breast cancer (HCC)    Cataracts, bilateral    DEGENERATIVE DISC DISEASE 10/28/2006   ECZEMA 10/28/2006   Gait disorder    Glaucoma    HYPERLIPIDEMIA 02/16/2010   HYPERTENSION 10/28/2006   Hypothyroidism    Impaired glucose tolerance 12/31/2010   MS (multiple sclerosis)    Multiple sclerosis 10/28/2006   Obesity    OSTEOARTHRITIS, KNEES, BILATERAL, SEVERE 06/14/2009   Sebaceous cyst    chest    VITAMIN B12 DEFICIENCY 06/14/2009     Past Surgical History:  Procedure Laterality Date   BREAST LUMPECTOMY WITH RADIOACTIVE SEED LOCALIZATION Right 08/05/2022   Procedure: RIGHT BREAST LUMPECTOMY WITH RADIOACTIVE SEED  LOCALIZATION;  Surgeon: Curvin Deward MOULD, MD;  Location: [facility] OR;  Service: General;  Laterality: Right;   COLONOSCOPY     ORIF FEMUR FRACTURE Right 05/13/2020   Procedure: OPEN REDUCTION INTERNAL FIXATION (ORIF) DISTAL FEMUR FRACTURE;  Surgeon: Marchia Drivers, MD;  Location: [facility] ORS;  Service: Orthopedics;  Laterality: Right;   TUBAL LIGATION  1958   UPPER GASTROINTESTINAL ENDOSCOPY      Family History  Problem Relation Age of Onset   Bone cancer Mother    Arthritis Mother    Diabetes Sister    Arthritis Sister    Stomach cancer Maternal Aunt    Prostate cancer Cousin        maternal first cousin     Social Connections: Unknown (10/18/2023)   Social Connection and Isolation Panel    Frequency of Communication with Friends and Family: Patient unable to answer    Frequency of Social Gatherings with Friends and Family: Never    Attends Religious Services: More than 4 times per year    Active Member of [employer] or Organizations: Yes    Attends Banker Meetings: Never    Marital Status: Widowed     Current Medications[1]   Physical Exam:   BP 134/72   Pulse (!) 48   SpO2 93%   Pertinent Findings  CN II-XII grossly intact Cerumen impaction left EAC Anterior rhinoscopy: Septum midline; bilateral inferior turbinates with no hypertrophy No lesions of oral cavity/oropharynx; numerous missing and decayed teeth  No obviously palpable neck masses/lymphadenopathy/thyromegaly No respiratory distress or  stridor  Seprately Identifiable Procedures:  Procedure: bilateral ear microscopy and cerumen removal using microscope (CPT [phone]) - Mod 25 Pre-procedure diagnosis: unilateral cerumen impaction left external auditory canal Post-procedure diagnosis: same Indication: unilateral  cerumen impaction; given patient's otologic complaints and history as well as for improved and comprehensive examination of external ear and tympanic membrane, bilateral otologic examination using  microscope was performed and impacted cerumen removed  Procedure: Patient was placed semi-recumbent. Both ear canals were examined using the microscope with findings above. Cerumen removed from the left external auditory canal using suction and currette with improvement in EAC examination and patency. Left: EAC was patent. TM was intact . Middle ear was aerated. Drainage: none Right: EAC was patent. TM was intact . Middle ear was aerated . Drainage: none Patient tolerated the procedure well.   Impression & Plans:  Deanna Wall is a 89 y.o. female with the following   Assessment and Plan    Impacted cerumen, left ear Cerumen impaction in the left ear caused unilateral hearing loss, resolved with removal. - Removed impacted cerumen from the left ear canal in clinic. - Advised return for evaluation and repeat cerumen removal if symptoms recur or hearing loss persists. - Recommended audiometric evaluation if hearing problems persist.           - f/u prn   Thank you for allowing me the opportunity to care for your patient. Please do not hesitate to contact me should you have any other questions.  Sincerely, Deanna Wall [facility] Phone: [phone] Fax: [phone]  03/22/2024, 12:02 PM         [1]  Current Outpatient Medications:    albuterol  (VENTOLIN  HFA) 108 (90 Base) MCG/ACT inhaler, Inhale 2 puffs into the lungs every 6 (six) hours as needed for wheezing or shortness of breath., Disp: 8 g, Rfl: 0   amLODipine  (NORVASC ) 5 MG tablet, Take 1 tablet by mouth once daily, Disp: 90 tablet, Rfl: 0   aspirin  81 MG tablet, Take 81 mg by mouth daily., Disp: , Rfl:    cholecalciferol (VITAMIN D3) 25 MCG (1000 UNIT) tablet, Take 2,000 Units by mouth daily., Disp: , Rfl:    citalopram  (CELEXA ) 10 MG tablet, Take 1 tablet by mouth once daily, Disp: 90 tablet, Rfl: 0   cloNIDine  (CATAPRES ) 0.3 MG tablet, Take 1 tablet (0.3 mg total) by mouth 2 (two) times  daily., Disp: 180 tablet, Rfl: 0   diclofenac  sodium (VOLTAREN ) 1 % GEL, Apply 2 g topically 4 (four) times daily as needed., Disp: 200 g, Rfl: 5   dorzolamide  (TRUSOPT ) 2 % ophthalmic solution, 1 drop 2 (two) times daily., Disp: , Rfl:    dorzolamide -timolol (COSOPT) 2-0.5 % ophthalmic solution, 1 drop 2 (two) times daily., Disp: , Rfl:    exemestane  (AROMASIN ) 25 MG tablet, Take 1 tablet (25 mg total) by mouth daily after breakfast., Disp: 90 tablet, Rfl: 1   ezetimibe  (ZETIA ) 10 MG tablet, Take 1 tablet by mouth once daily, Disp: 90 tablet, Rfl: 0   furosemide  (LASIX ) 20 MG tablet, TAKE 1 TABLET BY MOUTH ONCE DAILY AS  NEEDED  FOR  SWELLING, Disp: 90 tablet, Rfl: 0   latanoprost  (XALATAN ) 0.005 % ophthalmic solution, Place 1 drop into both eyes at bedtime., Disp: , Rfl: 3   levothyroxine  (SYNTHROID ) 25 MCG tablet, Take 1 tablet by mouth once daily, Disp: 90 tablet, Rfl: 0   LUMIGAN 0.01 % SOLN, SMARTSIG:In Eye(s), Disp: , Rfl:    nitrofurantoin  (MACRODANTIN )  50 MG capsule, Take 1 capsule by mouth once daily, Disp: 30 capsule, Rfl: 0   oxyCODONE  (ROXICODONE ) 5 MG immediate release tablet, Take 1 tablet (5 mg total) by mouth every 6 (six) hours as needed for severe pain., Disp: 10 tablet, Rfl: 0   polyethylene glycol powder (GLYCOLAX /MIRALAX ) 17 GM/SCOOP powder, Take 17 g by mouth 2 (two) times daily as needed., Disp: 3350 g, Rfl: 1   potassium chloride  (KLOR-CON ) 10 MEQ tablet, Take 1 tablet by mouth once daily, Disp: 90 tablet, Rfl: 3   vitamin B-12 (CYANOCOBALAMIN ) 500 MCG tablet, Take 500 mcg by mouth daily., Disp: , Rfl:

## 2024-08-17 ENCOUNTER — Encounter: Admitting: Family Medicine

## 2024-10-04 ENCOUNTER — Other Ambulatory Visit

## 2024-10-04 ENCOUNTER — Ambulatory Visit: Admitting: Hematology

## 2024-10-18 ENCOUNTER — Ambulatory Visit
